# Patient Record
Sex: Female | Born: 1945 | Race: Black or African American | Hispanic: No | Marital: Married | Smoking: Never smoker
Health system: Southern US, Community
[De-identification: ages and names within clinical notes are randomized; demographics above are authoritative.]

## PROBLEM LIST (undated history)

## (undated) DIAGNOSIS — G709 Myoneural disorder, unspecified: Secondary | ICD-10-CM

## (undated) DIAGNOSIS — E559 Vitamin D deficiency, unspecified: Secondary | ICD-10-CM

## (undated) DIAGNOSIS — F419 Anxiety disorder, unspecified: Secondary | ICD-10-CM

## (undated) DIAGNOSIS — T8859XA Other complications of anesthesia, initial encounter: Secondary | ICD-10-CM

## (undated) DIAGNOSIS — Z973 Presence of spectacles and contact lenses: Secondary | ICD-10-CM

## (undated) DIAGNOSIS — I639 Cerebral infarction, unspecified: Secondary | ICD-10-CM

## (undated) DIAGNOSIS — K56600 Partial intestinal obstruction, unspecified as to cause: Secondary | ICD-10-CM

## (undated) DIAGNOSIS — T4145XA Adverse effect of unspecified anesthetic, initial encounter: Secondary | ICD-10-CM

## (undated) DIAGNOSIS — R42 Dizziness and giddiness: Secondary | ICD-10-CM

## (undated) DIAGNOSIS — M549 Dorsalgia, unspecified: Secondary | ICD-10-CM

## (undated) DIAGNOSIS — K469 Unspecified abdominal hernia without obstruction or gangrene: Secondary | ICD-10-CM

## (undated) DIAGNOSIS — K219 Gastro-esophageal reflux disease without esophagitis: Secondary | ICD-10-CM

## (undated) DIAGNOSIS — M1990 Unspecified osteoarthritis, unspecified site: Secondary | ICD-10-CM

## (undated) DIAGNOSIS — E785 Hyperlipidemia, unspecified: Secondary | ICD-10-CM

## (undated) DIAGNOSIS — I10 Essential (primary) hypertension: Secondary | ICD-10-CM

## (undated) HISTORY — DX: Gastro-esophageal reflux disease without esophagitis: K21.9

## (undated) HISTORY — DX: Anxiety disorder, unspecified: F41.9

## (undated) HISTORY — DX: Unspecified abdominal hernia without obstruction or gangrene: K46.9

## (undated) HISTORY — DX: Vitamin D deficiency, unspecified: E55.9

## (undated) HISTORY — DX: Essential (primary) hypertension: I10

## (undated) HISTORY — DX: Hyperlipidemia, unspecified: E78.5

---

## 1970-02-18 HISTORY — PX: TUBAL LIGATION: SHX77

## 1985-02-18 HISTORY — PX: ABDOMINAL HYSTERECTOMY: SHX81

## 2005-06-06 ENCOUNTER — Emergency Department: Payer: Self-pay | Admitting: Emergency Medicine

## 2005-09-04 ENCOUNTER — Ambulatory Visit: Payer: Self-pay | Admitting: Anesthesiology

## 2005-10-24 ENCOUNTER — Ambulatory Visit: Payer: Self-pay | Admitting: Anesthesiology

## 2005-11-14 ENCOUNTER — Ambulatory Visit: Payer: Self-pay | Admitting: Anesthesiology

## 2006-01-06 ENCOUNTER — Emergency Department: Payer: Self-pay | Admitting: Emergency Medicine

## 2006-04-09 ENCOUNTER — Encounter: Admission: RE | Admit: 2006-04-09 | Discharge: 2006-04-09 | Payer: Self-pay | Admitting: Neurosurgery

## 2006-08-05 ENCOUNTER — Emergency Department: Payer: Self-pay | Admitting: Emergency Medicine

## 2007-02-11 ENCOUNTER — Other Ambulatory Visit: Payer: Self-pay

## 2007-02-11 ENCOUNTER — Inpatient Hospital Stay: Payer: Self-pay | Admitting: *Deleted

## 2007-02-19 HISTORY — PX: CARDIAC CATHETERIZATION: SHX172

## 2007-02-24 ENCOUNTER — Ambulatory Visit: Payer: Self-pay | Admitting: Internal Medicine

## 2008-02-19 ENCOUNTER — Emergency Department: Payer: Self-pay | Admitting: Unknown Physician Specialty

## 2008-02-25 ENCOUNTER — Ambulatory Visit: Payer: Self-pay | Admitting: Family Medicine

## 2008-11-19 ENCOUNTER — Emergency Department: Payer: Self-pay | Admitting: Emergency Medicine

## 2011-11-28 ENCOUNTER — Emergency Department: Payer: Self-pay | Admitting: Emergency Medicine

## 2012-01-22 DIAGNOSIS — Z860100 Personal history of colon polyps, unspecified: Secondary | ICD-10-CM | POA: Insufficient documentation

## 2012-03-09 ENCOUNTER — Ambulatory Visit: Payer: Self-pay | Admitting: Emergency Medicine

## 2012-03-09 LAB — CBC WITH DIFFERENTIAL/PLATELET
Basophil #: 0 10*3/uL (ref 0.0–0.1)
Basophil %: 0.8 %
Eosinophil #: 0.1 10*3/uL (ref 0.0–0.7)
Eosinophil %: 2.3 %
HCT: 47.2 % — ABNORMAL HIGH (ref 35.0–47.0)
HGB: 16 g/dL (ref 12.0–16.0)
Lymphocyte #: 2.5 10*3/uL (ref 1.0–3.6)
Lymphocyte %: 47.8 %
MCH: 30.7 pg (ref 26.0–34.0)
MCHC: 33.8 g/dL (ref 32.0–36.0)
MCV: 91 fL (ref 80–100)
Monocyte #: 0.4 x10 3/mm (ref 0.2–0.9)
Monocyte %: 6.6 %
Neutrophil #: 2.3 10*3/uL (ref 1.4–6.5)
Neutrophil %: 42.5 %
Platelet: 247 10*3/uL (ref 150–440)
RBC: 5.19 10*6/uL (ref 3.80–5.20)
RDW: 13.6 % (ref 11.5–14.5)
WBC: 5.3 10*3/uL (ref 3.6–11.0)

## 2012-03-12 ENCOUNTER — Inpatient Hospital Stay: Payer: Self-pay | Admitting: Emergency Medicine

## 2012-03-12 HISTORY — PX: COLON SURGERY: SHX602

## 2012-03-13 LAB — CREATININE, SERUM
Creatinine: 0.74 mg/dL (ref 0.60–1.30)
EGFR (African American): >60
EGFR (Non-African Amer.): >60

## 2012-03-18 LAB — PATHOLOGY REPORT

## 2012-04-13 ENCOUNTER — Emergency Department: Payer: Self-pay | Admitting: Emergency Medicine

## 2012-04-13 LAB — COMPREHENSIVE METABOLIC PANEL
Albumin: 4 g/dL (ref 3.4–5.0)
Alkaline Phosphatase: 77 U/L (ref 50–136)
Anion Gap: 10 (ref 7–16)
BUN: 10 mg/dL (ref 7–18)
Bilirubin,Total: 1.1 mg/dL — ABNORMAL HIGH (ref 0.2–1.0)
Calcium, Total: 8.9 mg/dL (ref 8.5–10.1)
Chloride: 108 mmol/L — ABNORMAL HIGH (ref 98–107)
Co2: 21 mmol/L (ref 21–32)
Creatinine: 0.7 mg/dL (ref 0.60–1.30)
EGFR (African American): >60
EGFR (Non-African Amer.): >60
Glucose: 102 mg/dL — ABNORMAL HIGH (ref 65–99)
Osmolality: 277 (ref 275–301)
Potassium: 3.2 mmol/L — ABNORMAL LOW (ref 3.5–5.1)
SGOT(AST): 21 U/L (ref 15–37)
SGPT (ALT): 17 U/L (ref 12–78)
Sodium: 139 mmol/L (ref 136–145)
Total Protein: 8 g/dL (ref 6.4–8.2)

## 2012-04-13 LAB — CBC
HCT: 46.1 % (ref 35.0–47.0)
HGB: 15.4 g/dL (ref 12.0–16.0)
MCH: 30.2 pg (ref 26.0–34.0)
MCHC: 33.5 g/dL (ref 32.0–36.0)
MCV: 90 fL (ref 80–100)
Platelet: 197 10*3/uL (ref 150–440)
RBC: 5.11 10*6/uL (ref 3.80–5.20)
RDW: 13.6 % (ref 11.5–14.5)
WBC: 6.7 10*3/uL (ref 3.6–11.0)

## 2012-04-13 LAB — URINALYSIS, COMPLETE
Bilirubin,UR: NEGATIVE
Glucose,UR: NEGATIVE mg/dL (ref 0–75)
Hyaline Cast: 1
Nitrite: NEGATIVE
Ph: 7 (ref 4.5–8.0)
Protein: NEGATIVE
RBC,UR: 1 /HPF (ref 0–5)
Specific Gravity: 1.01 (ref 1.003–1.030)
Squamous Epithelial: 6
WBC UR: 7 /HPF (ref 0–5)

## 2012-04-13 LAB — TROPONIN I: Troponin-I: <0.02 ng/mL

## 2012-04-13 LAB — CK TOTAL AND CKMB (NOT AT ARMC)
CK, Total: 57 U/L (ref 21–215)
CK-MB: <0.5 ng/mL — ABNORMAL LOW (ref 0.5–3.6)

## 2012-06-18 ENCOUNTER — Observation Stay: Payer: Self-pay | Admitting: Student

## 2012-06-18 LAB — BASIC METABOLIC PANEL
Anion Gap: 8 (ref 7–16)
BUN: 9 mg/dL (ref 7–18)
Calcium, Total: 9.4 mg/dL (ref 8.5–10.1)
Chloride: 106 mmol/L (ref 98–107)
Co2: 24 mmol/L (ref 21–32)
Creatinine: 0.75 mg/dL (ref 0.60–1.30)
EGFR (African American): >60
EGFR (Non-African Amer.): >60
Glucose: 90 mg/dL (ref 65–99)
Osmolality: 274 (ref 275–301)
Potassium: 3.6 mmol/L (ref 3.5–5.1)
Sodium: 138 mmol/L (ref 136–145)

## 2012-06-18 LAB — CK TOTAL AND CKMB (NOT AT ARMC)
CK, Total: 47 U/L (ref 21–215)
CK, Total: 41 U/L (ref 21–215)
CK-MB: <0.5 ng/mL — ABNORMAL LOW (ref 0.5–3.6)
CK-MB: <0.5 ng/mL — ABNORMAL LOW (ref 0.5–3.6)

## 2012-06-18 LAB — CBC
HCT: 44.7 % (ref 35.0–47.0)
HGB: 15.4 g/dL (ref 12.0–16.0)
MCH: 30.3 pg (ref 26.0–34.0)
MCHC: 34.4 g/dL (ref 32.0–36.0)
MCV: 88 fL (ref 80–100)
Platelet: 250 10*3/uL (ref 150–440)
RBC: 5.07 10*6/uL (ref 3.80–5.20)
RDW: 13.9 % (ref 11.5–14.5)
WBC: 5.8 10*3/uL (ref 3.6–11.0)

## 2012-06-18 LAB — LIPID PANEL
Cholesterol: 235 mg/dL — ABNORMAL HIGH (ref 0–200)
HDL Cholesterol: 47 mg/dL (ref 40–60)
Ldl Cholesterol, Calc: 169 mg/dL — ABNORMAL HIGH (ref 0–100)
Triglycerides: 96 mg/dL (ref 0–200)
VLDL Cholesterol, Calc: 19 mg/dL (ref 5–40)

## 2012-06-18 LAB — TROPONIN I: Troponin-I: <0.02 ng/mL

## 2012-06-18 LAB — HEMOGLOBIN A1C: Hemoglobin A1C: 5.6 % (ref 4.2–6.3)

## 2012-06-19 LAB — CBC WITH DIFFERENTIAL/PLATELET
Basophil #: 0.1 10*3/uL (ref 0.0–0.1)
Basophil %: 1.1 %
Eosinophil #: 0.3 10*3/uL (ref 0.0–0.7)
Eosinophil %: 4.9 %
HCT: 39.8 % (ref 35.0–47.0)
HGB: 13.7 g/dL (ref 12.0–16.0)
Lymphocyte #: 3.4 10*3/uL (ref 1.0–3.6)
Lymphocyte %: 61.8 %
MCH: 30.6 pg (ref 26.0–34.0)
MCHC: 34.4 g/dL (ref 32.0–36.0)
MCV: 89 fL (ref 80–100)
Monocyte #: 0.4 x10 3/mm (ref 0.2–0.9)
Monocyte %: 6.8 %
Neutrophil #: 1.4 10*3/uL (ref 1.4–6.5)
Neutrophil %: 25.4 %
Platelet: 213 10*3/uL (ref 150–440)
RBC: 4.47 10*6/uL (ref 3.80–5.20)
RDW: 13.7 % (ref 11.5–14.5)
WBC: 5.5 10*3/uL (ref 3.6–11.0)

## 2012-06-19 LAB — CK TOTAL AND CKMB (NOT AT ARMC)
CK, Total: 36 U/L (ref 21–215)
CK-MB: <0.5 ng/mL — ABNORMAL LOW (ref 0.5–3.6)

## 2012-06-19 LAB — BASIC METABOLIC PANEL
Anion Gap: 6 — ABNORMAL LOW (ref 7–16)
BUN: 12 mg/dL (ref 7–18)
Calcium, Total: 8.4 mg/dL — ABNORMAL LOW (ref 8.5–10.1)
Chloride: 111 mmol/L — ABNORMAL HIGH (ref 98–107)
Co2: 25 mmol/L (ref 21–32)
Creatinine: 0.68 mg/dL (ref 0.60–1.30)
EGFR (African American): >60
EGFR (Non-African Amer.): >60
Glucose: 84 mg/dL (ref 65–99)
Osmolality: 282 (ref 275–301)
Potassium: 3.6 mmol/L (ref 3.5–5.1)
Sodium: 142 mmol/L (ref 136–145)

## 2012-06-19 LAB — TROPONIN I: Troponin-I: <0.02 ng/mL

## 2012-06-19 LAB — MAGNESIUM: Magnesium: 1.9 mg/dL

## 2013-08-27 ENCOUNTER — Emergency Department: Payer: Self-pay | Admitting: Emergency Medicine

## 2013-08-27 LAB — URINALYSIS, COMPLETE
Bilirubin,UR: NEGATIVE
Blood: NEGATIVE
Glucose,UR: NEGATIVE mg/dL (ref 0–75)
Ketone: NEGATIVE
Leukocyte Esterase: NEGATIVE
Nitrite: NEGATIVE
Ph: 7 (ref 4.5–8.0)
Protein: NEGATIVE
RBC,UR: 4 /HPF (ref 0–5)
Specific Gravity: 1.016 (ref 1.003–1.030)
Squamous Epithelial: 7
WBC UR: 3 /HPF (ref 0–5)

## 2013-08-27 LAB — CBC WITH DIFFERENTIAL/PLATELET
Basophil #: 0.1 10*3/uL (ref 0.0–0.1)
Basophil %: 1.2 %
Eosinophil #: 0.2 10*3/uL (ref 0.0–0.7)
Eosinophil %: 4.1 %
HCT: 44.6 % (ref 35.0–47.0)
HGB: 14.8 g/dL (ref 12.0–16.0)
Lymphocyte #: 3.1 10*3/uL (ref 1.0–3.6)
Lymphocyte %: 53.8 %
MCH: 30.7 pg (ref 26.0–34.0)
MCHC: 33.2 g/dL (ref 32.0–36.0)
MCV: 92 fL (ref 80–100)
Monocyte #: 0.5 x10 3/mm (ref 0.2–0.9)
Monocyte %: 7.8 %
Neutrophil #: 1.9 10*3/uL (ref 1.4–6.5)
Neutrophil %: 33.1 %
Platelet: 221 10*3/uL (ref 150–440)
RBC: 4.83 10*6/uL (ref 3.80–5.20)
RDW: 13.4 % (ref 11.5–14.5)
WBC: 5.8 10*3/uL (ref 3.6–11.0)

## 2013-08-27 LAB — TSH: Thyroid Stimulating Horm: 1.4 u[IU]/mL

## 2013-08-27 LAB — BASIC METABOLIC PANEL
Anion Gap: 6 — ABNORMAL LOW (ref 7–16)
BUN: 11 mg/dL (ref 7–18)
Calcium, Total: 8.3 mg/dL — ABNORMAL LOW (ref 8.5–10.1)
Chloride: 109 mmol/L — ABNORMAL HIGH (ref 98–107)
Co2: 25 mmol/L (ref 21–32)
Creatinine: 1.28 mg/dL (ref 0.60–1.30)
EGFR (African American): 51 — ABNORMAL LOW
EGFR (Non-African Amer.): 44 — ABNORMAL LOW
Glucose: 94 mg/dL (ref 65–99)
Osmolality: 279 (ref 275–301)
Potassium: 3.6 mmol/L (ref 3.5–5.1)
Sodium: 140 mmol/L (ref 136–145)

## 2013-08-27 LAB — TROPONIN I: Troponin-I: <0.02 ng/mL

## 2013-09-09 ENCOUNTER — Ambulatory Visit: Payer: Self-pay | Admitting: Gastroenterology

## 2013-10-19 ENCOUNTER — Ambulatory Visit: Payer: Self-pay | Admitting: Family Medicine

## 2014-01-05 ENCOUNTER — Emergency Department: Payer: Self-pay | Admitting: Emergency Medicine

## 2014-01-05 LAB — BASIC METABOLIC PANEL
Anion Gap: 7 (ref 7–16)
BUN: 12 mg/dL (ref 7–18)
Calcium, Total: 8.6 mg/dL (ref 8.5–10.1)
Chloride: 108 mmol/L — ABNORMAL HIGH (ref 98–107)
Co2: 26 mmol/L (ref 21–32)
Creatinine: 0.89 mg/dL (ref 0.60–1.30)
EGFR (African American): >60
EGFR (Non-African Amer.): >60
Glucose: 94 mg/dL (ref 65–99)
Osmolality: 281 (ref 275–301)
Potassium: 3.9 mmol/L (ref 3.5–5.1)
Sodium: 141 mmol/L (ref 136–145)

## 2014-01-05 LAB — URINALYSIS, COMPLETE
Bilirubin,UR: NEGATIVE
Glucose,UR: NEGATIVE mg/dL (ref 0–75)
Ketone: NEGATIVE
Leukocyte Esterase: NEGATIVE
Nitrite: NEGATIVE
Ph: 6 (ref 4.5–8.0)
Protein: NEGATIVE
RBC,UR: 3 /HPF (ref 0–5)
Specific Gravity: 1.024 (ref 1.003–1.030)
Squamous Epithelial: 1
WBC UR: 4 /HPF (ref 0–5)

## 2014-01-05 LAB — CBC
HCT: 47.2 % — ABNORMAL HIGH (ref 35.0–47.0)
HGB: 15.5 g/dL (ref 12.0–16.0)
MCH: 30.5 pg (ref 26.0–34.0)
MCHC: 32.9 g/dL (ref 32.0–36.0)
MCV: 93 fL (ref 80–100)
Platelet: 238 10*3/uL (ref 150–440)
RBC: 5.08 10*6/uL (ref 3.80–5.20)
RDW: 13.1 % (ref 11.5–14.5)
WBC: 6.1 10*3/uL (ref 3.6–11.0)

## 2014-01-05 LAB — TROPONIN I: Troponin-I: <0.02 ng/mL

## 2014-02-22 DIAGNOSIS — E785 Hyperlipidemia, unspecified: Secondary | ICD-10-CM | POA: Diagnosis not present

## 2014-05-06 DIAGNOSIS — R202 Paresthesia of skin: Secondary | ICD-10-CM | POA: Diagnosis not present

## 2014-05-06 DIAGNOSIS — Z8261 Family history of arthritis: Secondary | ICD-10-CM | POA: Diagnosis not present

## 2014-06-05 ENCOUNTER — Emergency Department
Admit: 2014-06-05 | Disposition: A | Discharge status: Home / Self Care | Payer: Self-pay | Admitting: Emergency Medicine

## 2014-06-05 DIAGNOSIS — R110 Nausea: Secondary | ICD-10-CM | POA: Diagnosis not present

## 2014-06-05 DIAGNOSIS — Z7982 Long term (current) use of aspirin: Secondary | ICD-10-CM | POA: Diagnosis not present

## 2014-06-05 DIAGNOSIS — M549 Dorsalgia, unspecified: Secondary | ICD-10-CM | POA: Diagnosis not present

## 2014-06-05 DIAGNOSIS — K5900 Constipation, unspecified: Secondary | ICD-10-CM | POA: Diagnosis not present

## 2014-06-05 DIAGNOSIS — F419 Anxiety disorder, unspecified: Secondary | ICD-10-CM | POA: Diagnosis not present

## 2014-06-05 DIAGNOSIS — R079 Chest pain, unspecified: Secondary | ICD-10-CM | POA: Diagnosis not present

## 2014-06-05 DIAGNOSIS — R100 Acute abdomen: Secondary | ICD-10-CM | POA: Diagnosis not present

## 2014-06-05 DIAGNOSIS — D7389 Other diseases of spleen: Secondary | ICD-10-CM | POA: Diagnosis not present

## 2014-06-05 DIAGNOSIS — K5730 Diverticulosis of large intestine without perforation or abscess without bleeding: Secondary | ICD-10-CM | POA: Diagnosis not present

## 2014-06-05 DIAGNOSIS — R109 Unspecified abdominal pain: Secondary | ICD-10-CM | POA: Diagnosis not present

## 2014-06-05 DIAGNOSIS — Z79899 Other long term (current) drug therapy: Secondary | ICD-10-CM | POA: Diagnosis not present

## 2014-06-05 LAB — COMPREHENSIVE METABOLIC PANEL
Albumin: 3.9 g/dL
Alkaline Phosphatase: 60 U/L
Anion Gap: 6 — ABNORMAL LOW (ref 7–16)
BUN: 17 mg/dL
Bilirubin,Total: 0.9 mg/dL
Calcium, Total: 8.5 mg/dL — ABNORMAL LOW
Chloride: 108 mmol/L
Co2: 25 mmol/L
Creatinine: 0.83 mg/dL
EGFR (African American): >60
EGFR (Non-African Amer.): >60
Glucose: 134 mg/dL — ABNORMAL HIGH
Potassium: 3 mmol/L — ABNORMAL LOW
SGOT(AST): 21 U/L
SGPT (ALT): 12 U/L — ABNORMAL LOW
Sodium: 139 mmol/L
Total Protein: 7.1 g/dL

## 2014-06-05 LAB — CBC WITH DIFFERENTIAL/PLATELET
Basophil #: 0.1 10*3/uL (ref 0.0–0.1)
Basophil %: 1 %
Eosinophil #: 0.2 10*3/uL (ref 0.0–0.7)
Eosinophil %: 2.3 %
HCT: 43 % (ref 35.0–47.0)
HGB: 14.4 g/dL (ref 12.0–16.0)
Lymphocyte #: 3.3 10*3/uL (ref 1.0–3.6)
Lymphocyte %: 45 %
MCH: 30.6 pg (ref 26.0–34.0)
MCHC: 33.4 g/dL (ref 32.0–36.0)
MCV: 92 fL (ref 80–100)
Monocyte #: 0.5 x10 3/mm (ref 0.2–0.9)
Monocyte %: 6.1 %
Neutrophil #: 3.4 10*3/uL (ref 1.4–6.5)
Neutrophil %: 45.6 %
Platelet: 235 10*3/uL (ref 150–440)
RBC: 4.69 10*6/uL (ref 3.80–5.20)
RDW: 13 % (ref 11.5–14.5)
WBC: 7.4 10*3/uL (ref 3.6–11.0)

## 2014-06-05 LAB — URINALYSIS, COMPLETE
Bilirubin,UR: NEGATIVE
Glucose,UR: NEGATIVE mg/dL (ref 0–75)
Ketone: NEGATIVE
Leukocyte Esterase: NEGATIVE
Nitrite: NEGATIVE
Ph: 6 (ref 4.5–8.0)
Protein: NEGATIVE
Specific Gravity: 1.013 (ref 1.003–1.030)
Squamous Epithelial: NONE SEEN

## 2014-06-05 LAB — TROPONIN I: Troponin-I: <0.03 ng/mL

## 2014-06-05 LAB — LIPASE, BLOOD: Lipase: 39 U/L

## 2014-06-10 NOTE — H&P (Signed)
PATIENT NAME:  [name], [name] MR#:  [id number] DATE OF BIRTH:  1945/12/27  DATE OF ADMISSION:  06/18/2012  ADMITTING PHYSICIAN: Gladstone Lighter, MD  PRIMARY CARE PHYSICIAN: [name]. [name], MD   PRIMARY CARDIOLOGIST: Has seen Dr. Nehemiah Massed in the past   CHIEF COMPLAINT: Chest pain.   HISTORY OF PRESENT ILLNESS: The patient is a 69 year old African American female with no significant past medical history other than degenerative arthritis, comes to the hospital secondary to a 2-week history of chest pain. The patient said she had chest pain in the past, had a cardiac cath in 2009 which was negative at the time, has been having chest pain lately over the last 2 weeks and seen her PCP.  She had an old prescription for Xanax which she used to see if that would help her, but it did not help her because she does not think this is anxiety. She had EMS come in for chest pain last week, but at that time it was more a panic attack and was relieved by Xanax. Her symptoms have been getting worse, and today she was having extensive  chest tightness, 8 out of 10 in intensity, radiating to her back, associated with nausea and dyspnea, and comes to the hospital. EKG did not show any acute changes, and first set of troponin is negative, so is being admitted under observation for Myoview in the a.m.   PAST MEDICAL HISTORY: Degenerative arthritis.  PAST SURGICAL HISTORY:  1.  Partial transverse colostomy for adenomatous polyp in January 2014.  2.  Cholecystectomy.  3.  Tubal ligation.  4.  Hysterectomy.   ALLERGIES TO MEDICATIONS: No known drug allergies.   HOME MEDICATIONS: Xanax 0.5 mg p.r.n., has not used it for a long time other than a week ago for panic attack.   SOCIAL HISTORY: Lives at home with her husband. No smoking or alcohol use.   FAMILY HISTORY: Mom had congestive heart failure, and dad died from metastatic unknown cancer.   REVIEW OF SYSTEMS:   CONSTITUTIONAL: No fever, fatigue or weakness.   EYES: Uses contact lens. No double vision, inflammation, glaucoma or cataracts.  ENT: No tinnitus, ear pain, hearing loss, epistaxis or discharge.  RESPIRATORY: No cough, wheeze, hemoptysis or COPD.  CARDIOVASCULAR: Positive for chest pain. No orthopnea, edema, arrhythmia, palpitations or syncope.  GASTROINTESTINAL: No nausea, vomiting, diarrhea, abdominal pain, hematemesis or melena.  GENITOURINARY: No dysuria, hematuria, renal calculus, frequency or incontinence.  ENDOCRINE: No polyuria, nocturia, thyroid problems, heat or cold intolerance.  HEMATOLOGY: No anemia, easy bruising or bleeding.  SKIN: No acne, rash or lesions.  MUSCULOSKELETAL: No neck, back, shoulder pain, arthritis or gout.  NEUROLOGIC: No numbness, weakness, CVA, transient ischemic attack or seizures.  PSYCHOLOGICAL: No anxiety, insomnia or depression.   PHYSICAL EXAMINATION: VITAL SIGNS: Temperature 98.3 degrees Fahrenheit, pulse 89, respirations 20, blood pressure 147/97, pulse ox 100% on room air.  GENERAL: Well-built, well-nourished female lying in bed, not in any acute distress.  HEENT: Normocephalic, atraumatic. Pupils are equal, round, reacting to light. Anicteric sclerae. Extraocular movements intact. Oropharynx clear without erythema, mass or exudates.  NECK: Supple. No thyromegaly, JVD or carotid bruits. No lymphadenopathy.  LUNGS: Moving air bilaterally, no wheeze or crackles. No use of accessory muscles for breathing.  CARDIOVASCULAR: S1, S2 regular rate and rhythm. No murmurs, rubs, or gallops.  ABDOMEN: Soft.  Mild tenderness to the left side of her midline incision from a small herniated area from her recent surgery but no guarding or  rigidity. Normal bowel sounds. No hepatosplenomegaly.  EXTREMITIES: No pedal edema. No clubbing or cyanosis. 2+ dorsalis pedis pulses palpable bilaterally.  SKIN: No acne, rash or lesions.  LYMPHATICS: No cervical or inguinal lymphadenopathy.  NEUROLOGIC: Cranial nerves intact.  No focal motor or sensory deficits.   PSYCHOLOGICAL: The patient is awake, alert, oriented x 3.   LABORATORY AND RADIOLOGICAL DATA:  1.  WBC 5.8, hemoglobin 15.5, hematocrit 44.7, platelet count 250.  2.  Sodium 138, potassium 3.6, chloride 106, bicarbonate 24, BUN 9, creatinine 0.75, glucose 90, calcium of 9.4.  3.  First set of troponins less than 0.02.  CK 47 and CK-MB less than 0.5.  4.  Chest x-ray at the time revealing no acute cardiopulmonary disease.  5. Again, cardiac catheterization done in January 2009 showing normal coronary anatomy, normal LV function with EF of 70%.  6.  EKG showing normal sinus rhythm, heart rate of 70. No ST-T wave abnormalities.   ASSESSMENT AND PLAN: This is a 69 year old female with no significant past medical history, comes to the ER secondary to chest pain.   1.  Chest pain:  Could be angina versus anxiety. She had recurrent chest pains in the past and had a cardiac cath in 2009 which was completely negative; however, since the symptoms are correlating with typical angina, with her age and family history, we will admit her under observation to telemetry. Continue aspirin, nitro, get hemoglobin A1c and lipid profile for risk stratification and treadmill Myoview in the a.m. by [facility]. Recycle troponins.  2.  Arthritis:  Well-controlled.  3.  Gastrointestinal and deep vein thrombosis prophylaxis:  On Protonix and subcutaneous heparin.   CODE STATUS:  FULL CODE.    TIME SPENT WITH ADMISSION:  50 minutes.   ____________________________ Gladstone Lighter, MD rk:cb D: 06/18/2012 17:32:52 ET T: 06/18/2012 17:56:01 ET JOB#: [id number]  cc: Gladstone Lighter, MD, <Dictator> [name]. [name], MD Gladstone Lighter MD ELECTRONICALLY SIGNED 06/19/2012 02:77

## 2014-06-10 NOTE — Discharge Summary (Signed)
PATIENT NAME:  [name], [name] MR#:  [id number] DATE OF BIRTH:  September 30, 1945  DATE OF ADMISSION:  06/18/2012 DATE OF DISCHARGE:  06/19/2012  CHIEF COMPLAINT:  Chest pain.   DISCHARGE DIAGNOSES: 1.  Chest pain, likely anxiety and not cardiac.  2.  Hyperlipidemia.  3.  Anxiety.  4.  Degenerative arthritis.   DISCHARGE MEDICATIONS: 1.  Xanax 0.5 mg 1 tab as needed for anxiety.  2.  Aspirin 81 mg daily.  3.  Simvastatin 20 mg daily.   DIET:  Low fat, low cholesterol.   ACTIVITY:  As tolerated.   DISCHARGE FOLLOW-UP:  Please follow up with your primary care physician within 1 to 2 weeks.   DISPOSITION:  Home.   SIGNIFICANT LABORATORIES:  A stress test showing left ventricular functional mildly reduced.  No evidence of myocardial scar or ischemia.  Echocardiogram showing ejection fraction of 60% to 65% with normal LV systolic function, impaired relaxation pattern of LV diastolic filling.  X-ray of the chest, PA and lateral, showing no acute cardiopulmonary disease.  Troponins were negative x 2 whereas CK-MB fraction was negative x 3.  LDL was 169.  Triglycerides was 96, hemoglobin A1c was 5.6.  On arrival sodium was 138, potassium 3.6, chloride 106.   HISTORY OF PRESENT ILLNESS AND HOSPITAL COURSE:  For full details of history and physical, please see the dictation on May 1 by Dr. Tressia Miners, but briefly, this is a pleasant 69 year old African American female with degenerative arthritis and a history of recent partial colectomy for adenomatous polyp in January of this year, who presents with chest pain.  The patient did have an initial episode of chest pain several weeks ago that resolved with Xanax, but then had more chest pains that did not and therefore was admitted to the hospitalist service to rule out acute MI.  She told me that she has had increased anxiety since her surgery in January, but her symptoms could be worse with exertion.  She was admitted to the hospitalist service in telemetry.   Troponins were negative x 3.  CK-MB was negative x 3.  She did not have any further chest pains.  Her lipid profile was checked and she did have elevated LDL at 169 and therefore started on simvastatin.  Her aspirin was continued.  She underwent a stress test which did not show acute ischemia and therefore will be discharged.  Echocardiogram showed normal ejection fraction.  It showed some diastolic dysfunction, however that is not significant in regards to this admission and patient does not appear to be in any acute diastolic congestive heart failure and is not volume-overloaded.  She can follow up with her primary care physician for outpatient follow-up and treatment of her Xanax which could potentially be contributing to her symptoms.     Total time spent:  33 minutes.     ____________________________ Vivien Presto, MD sa:ea D: 06/19/2012 16:14:31 ET T: 06/20/2012 06:03:34 ET JOB#: [id number]  cc: Vivien Presto, MD, <Dictator> [place] [name], MD Vivien Presto MD ELECTRONICALLY SIGNED 07/13/2012 15:10

## 2014-06-10 NOTE — Op Note (Signed)
PATIENT NAME:  [name], [name] MR#:  [id number] DATE OF BIRTH:  Oct 24, 1945  DATE OF PROCEDURE:  03/12/2012  PREOPERATIVE DIAGNOSIS: Transverse colon polyp.   POSTOPERATIVE DIAGNOSIS: Transverse colon polyp.   PROCEDURE: Transverse colon resection.   SURGEON: Vella Kohler, M.D.   INDICATIONS: This is a patient who had a colonoscopy performed, had a polyp in the transverse colon. Dr. Dionne Milo did a colonoscopic resection and he thought there was a possibility that some of the polyp was still there. The area was inked and the patient was then brought in for resection of the transverse colon. After the patient was put to sleep, the abdomen was then prepped and draped. An incision was made, an upper midline incision. After cutting skin and subcutaneous tissue, the fascia was cut. The abdomen was entered. The patient had multiple adhesions from past surgery. Those were released, and most of the adhesions were over the right hepatic area where the hepatic flexure was stuck and part of the transverse colon was stuck. Those were released. Part of the cecum was also mobilized. I saw the area a little bit to the left of midline, an area of black ink, so I took off the omentum from the surface of the colon with the Harmonic scalpel. Quite a generous amount of the colon was then removed, about a couple of inches on either side at least, and it was transected and then I removed this portion of colon. I opened the thing. I did not see an obvious lesion, but there was an area where transection was performed where I could see it. After that, I mobilized more of the ascending colon and mobilized more of the transverse colon up to the splenic flexure area, and I felt the colon from the surface and I could not feel any other lesions. I took out another 1-1/2 inches towards the splenic flexure area too to make sure that the whole thing was out. After this was taken off, I then opened both ends of the colon. I looked visually  from inside the colon. I could not feel anything from a distance on each side, so I think the resected area was in the colon which was resected. After, anastomosis was then performed with GIA stapler and with TA-60. Then, I applied sutures over the resected margin with 4-0 silk sutures. After this was done with the TA, I closed the end of the colon, showing the anastomosis wide open, and I made sure there was no tumor in this area. After this was done, the mesentery was then closed. All the instrument count and the sponge count was correct. The fascia was then closed with interrupted 0 Vicryl sutures. I felt the liver also. There was no evidence of any tumor in the abdomen anywhere else. The fascia was closed with interrupted 0 Vicryl sutures. Skin was closed with staples and Marcaine was injected in the skin. The patient tolerated the procedure well and was sent to the recovery room in satisfactory condition.   ____________________________ Welford Roche Phylis Bougie, MD msh:jm D: 03/12/2012 14:44:33 ET T: 03/12/2012 16:38:50 ET JOB#: [id number]  cc: Jahnaya Branscome S. Phylis Bougie, MD, <Dictator> Sharene Butters MD ELECTRONICALLY SIGNED 03/15/2012 13:40

## 2014-06-10 NOTE — Discharge Summary (Signed)
Dates of Admission and Diagnosis:   Date of Admission 12-Mar-2012    Date of Discharge 16-Mar-2012    Admitting Diagnosis transverse colon tumor polyp    Final Diagnosis pending pathology    Discharge Diagnosis 1 colon polyp     Chief Complaint/History of Present Illness pt had colonoscopy had a polyp in transverse colon resected earlier pt had surgery to remove the area   Routine BB:  22-Jan-14 06:00    Crossmatch Unit 1 Cancelled   Crossmatch Unit 2 Cancelled  Routine Chem:  22-Jan-14 06:00    Result Comment XMATCH - PRODUCTS NOT NEEDED FOR SURGERY  Result(s) reported on 13 Mar 2012 at 02:28PM.  24-Jan-14 10:27    Creatinine (comp) 0.74   eGFR (African American) >60   eGFR (Non-African American) >60 (eGFR values <68m/min/1.73 m2 may be an indication of chronic kidney disease (CKD). Calculated eGFR is useful in patients with stable renal function. The eGFR calculation will not be reliable in acutely ill patients when serum creatinine is changing rapidly. It is not useful in  patients on dialysis. The eGFR calculation may not be applicable to patients at the low and high extremes of body sizes, pregnant women, and vegetarians.)   Hospital Course:   Hospital Course did very well and moving bowels eating no nausea and vomiting    Condition on Discharge Good   DISCHARGE INSTRUCTIONS HOME MEDS:  Medication Reconciliation:  Patient's Home Medications at Discharge:     Medication Instructions  xanax 0.5 mg oral tablet  1 tab(s) orally , As Needed- for Anxiety, Nervousness    prilosec 20 mg oral delayed release capsule  1 cap(s) orally once a day, As Needed   aspirin 867m 1 tab(s) orally once a day in am   percocet 5/325 325 mg-5 mg oral tablet  1 tab(s) orally every 6 hours   ciprofloxacin ophthalmic 0.3% solution  2  drops to each affected eye every 4 hours     PRESCRIPTIONS: PRINTED AND PLACED ON CHART   STOP TAKING THE FOLLOWING MEDICATION(S):    none  Physician's Instructions:   Home Health? No    Treatments None    Diet Regular    Activity Limitations No exertional activity    Return to Work after follow up visit with MD    Time frame for Follow Up Appointment 1-2 weeks   Electronic Signatures: HaVella KohlerMD)  (Signed 27-Jan-14 13:02)  Authored: ADMISSION DATE AND DIAGNOSIS, CHIEF COMPLAINT/HPI, PERTINENT LAEmersonEDS, PATIENT INSTRUCTIONS   Last Updated: 27-Jan-14 13:02 by HaVella KohlerMD)

## 2014-07-21 ENCOUNTER — Other Ambulatory Visit: Payer: Self-pay | Admitting: Family Medicine

## 2014-08-01 ENCOUNTER — Ambulatory Visit (INDEPENDENT_AMBULATORY_CARE_PROVIDER_SITE_OTHER): Payer: Medicare Other | Admitting: Family Medicine

## 2014-08-01 ENCOUNTER — Encounter: Payer: Self-pay | Admitting: Family Medicine

## 2014-08-01 ENCOUNTER — Ambulatory Visit: Payer: Self-pay | Admitting: Family Medicine

## 2014-08-01 VITALS — BP 124/82 | HR 64 | Temp 98.1°F | Resp 16 | Ht 63.0 in | Wt 152.2 lb

## 2014-08-01 DIAGNOSIS — F4322 Adjustment disorder with anxiety: Secondary | ICD-10-CM

## 2014-08-01 DIAGNOSIS — J309 Allergic rhinitis, unspecified: Secondary | ICD-10-CM | POA: Insufficient documentation

## 2014-08-01 DIAGNOSIS — I10 Essential (primary) hypertension: Secondary | ICD-10-CM | POA: Insufficient documentation

## 2014-08-01 DIAGNOSIS — F419 Anxiety disorder, unspecified: Secondary | ICD-10-CM | POA: Insufficient documentation

## 2014-08-01 DIAGNOSIS — R42 Dizziness and giddiness: Secondary | ICD-10-CM | POA: Diagnosis not present

## 2014-08-01 DIAGNOSIS — E559 Vitamin D deficiency, unspecified: Secondary | ICD-10-CM | POA: Diagnosis not present

## 2014-08-01 DIAGNOSIS — E785 Hyperlipidemia, unspecified: Secondary | ICD-10-CM | POA: Insufficient documentation

## 2014-08-01 DIAGNOSIS — K219 Gastro-esophageal reflux disease without esophagitis: Secondary | ICD-10-CM | POA: Insufficient documentation

## 2014-08-01 DIAGNOSIS — IMO0002 Reserved for concepts with insufficient information to code with codable children: Secondary | ICD-10-CM | POA: Insufficient documentation

## 2014-08-01 NOTE — Progress Notes (Signed)
Subjective:     Patient ID: [name], female   DOB: 11/22/1945, 69 y.o.   MRN: [id number]  HPI  Chief Complaint  Patient presents with  . Abdominal Pain    patient is present in office today with complaints of abdominal pain that is described as all over. Patient states she had colon surgery 2 yrs ago and has always had constant need to use restroom, patient reports multiple bowl movements daily. Last week and today patient had eaten dairy product and shortly after eating she has had abdominal discomfort and bloating. Patient reports she felt dizzy/lightheadead and is concerned because she had similar symptoms when she found out her iron  was low yr ago  She has just recently experienced and attended the funerals of a family member and in the last 2 weeks of her best friend. States she is taking Xanax twice daily to cope but has been having crying spells at times.Somatic sx have included nausea, abdominal pain and lightheadedness.   Review of Systems  Gastrointestinal: Positive for abdominal distention (last moved her bowels this AM. Pattern is usually twice daily. Has a hx of parrtial colectomy two  years ago for  a "pre-malignant " mass. Last colonoscopy in 2015. She is due f/u with Dr. Allen Norris in July.).  Musculoskeletal: Positive for arthralgias (states Vitamin D helped this but she discontinued it after 3 weeks due to constipation.).       Objective:   Physical Exam  Constitutional: She appears well-developed and well-nourished. No distress.  Cardiovascular: Normal rate and regular rhythm.   Pulmonary/Chest: Breath sounds normal.  Abdominal: Soft. Bowel sounds are normal. There is no tenderness.       Assessment:     1. Adjustment disorder with anxious mood   2. Vitamin D deficiency - Vitamin D 1,25 dihydroxy  3. Lightheade - CBC with Differential/Platelet - COMPLETE METABOLIC PANEL WITH GFR - Ferritin     Plan:     Continue Xanax 2-3 x day Further evaluation and/or  referral to G.I. Before July pending lab work.

## 2014-08-01 NOTE — Patient Instructions (Signed)
Discussed continuing alprazolam 2-3 x day. We will call with lab work We will schedule appointment with Dr.Wohl after reviewing your labwork.

## 2014-08-02 ENCOUNTER — Telehealth: Payer: Self-pay

## 2014-08-02 LAB — CBC WITH DIFFERENTIAL/PLATELET
Basophils Absolute: 0 10*3/uL (ref 0.0–0.2)
Basos: 1 %
EOS (ABSOLUTE): 0.1 10*3/uL (ref 0.0–0.4)
Eos: 3 %
Hematocrit: 43.8 % (ref 34.0–46.6)
Hemoglobin: 14.4 g/dL (ref 11.1–15.9)
Immature Grans (Abs): 0 10*3/uL (ref 0.0–0.1)
Immature Granulocytes: 0 %
Lymphocytes Absolute: 2.7 10*3/uL (ref 0.7–3.1)
Lymphs: 52 %
MCH: 29.7 pg (ref 26.6–33.0)
MCHC: 32.9 g/dL (ref 31.5–35.7)
MCV: 90 fL (ref 79–97)
Monocytes Absolute: 0.4 10*3/uL (ref 0.1–0.9)
Monocytes: 8 %
Neutrophils Absolute: 1.8 10*3/uL (ref 1.4–7.0)
Neutrophils: 36 %
Platelets: 260 10*3/uL (ref 150–379)
RBC: 4.85 x10E6/uL (ref 3.77–5.28)
RDW: 13.4 % (ref 12.3–15.4)
WBC: 5.1 10*3/uL (ref 3.4–10.8)

## 2014-08-02 LAB — FERRITIN: Ferritin: 215 ng/mL — ABNORMAL HIGH (ref 15–150)

## 2014-08-02 NOTE — Telephone Encounter (Signed)
-----   Message from Carmon Ginsberg, [place] sent at 08/02/2014  8:10 AM EDT ----- No anemia, your iron stores are too high so stop taking any iron supplements. Metabolic profile and Vitamin D are pending

## 2014-08-02 NOTE — Telephone Encounter (Signed)
Patient has been advised of lab report.

## 2014-08-05 ENCOUNTER — Telehealth: Payer: Self-pay | Admitting: Family Medicine

## 2014-08-05 LAB — VITAMIN D 1,25 DIHYDROXY
Vitamin D 1, 25 (OH)2 Total: 47 pg/mL
Vitamin D2 1, 25 (OH)2: 29 pg/mL
Vitamin D3 1, 25 (OH)2: 18 pg/mL

## 2014-08-05 NOTE — Telephone Encounter (Signed)
Pt is requesting her lab results.  XA#[phone]/CN

## 2014-08-05 NOTE — Addendum Note (Signed)
Addended by: Quay Burow on: 08/05/2014 07:57 AM   Modules accepted: Orders

## 2014-08-08 ENCOUNTER — Telehealth: Payer: Self-pay

## 2014-08-08 NOTE — Telephone Encounter (Signed)
Patient was informed of Vitamin D, have you seen metabolic profile?

## 2014-08-08 NOTE — Telephone Encounter (Signed)
Another patient I had to add the correct metabolic profile to previous draw. May need to call [facility] and see if they are responding to these ordrers

## 2014-08-08 NOTE — Telephone Encounter (Signed)
-----   Message from Carmon Ginsberg, [place] sent at 08/05/2014  7:58 AM EDT ----- Vitamin D ok. Metabolic profile is pending.

## 2014-08-18 ENCOUNTER — Telehealth: Payer: Self-pay | Admitting: Family Medicine

## 2014-08-18 NOTE — Telephone Encounter (Signed)
Pt contacted office for refill request on the following medications: ALPRAZolam (XANAX) 0.5 MG tablet Pt stated that when she was here for her last OV with [name] she was advised to take one pill in the morning and one at night. Pt stated that she has been doing that and she is running out of the medication because it was written for a 1/2 in the morning and 1/2 at night. Pt would like a new RX sent to [facility] in [place]. Thanks TNP

## 2014-08-23 ENCOUNTER — Other Ambulatory Visit: Payer: Self-pay | Admitting: Family Medicine

## 2014-08-23 DIAGNOSIS — F419 Anxiety disorder, unspecified: Secondary | ICD-10-CM

## 2014-08-23 MED ORDER — ALPRAZOLAM 0.5 MG PO TABS: 0.5000 mg | ORAL_TABLET | Freq: Two times a day (BID) | ORAL | Status: DC | PRN

## 2014-08-23 NOTE — Telephone Encounter (Signed)
Medication available for pick up. 

## 2014-08-23 NOTE — Telephone Encounter (Signed)
Advised patient that medication is ready for pick up.

## 2014-09-01 ENCOUNTER — Other Ambulatory Visit: Payer: Self-pay

## 2014-09-01 ENCOUNTER — Telehealth: Payer: Self-pay | Admitting: Gastroenterology

## 2014-09-01 NOTE — Telephone Encounter (Signed)
Pt was on recall list for 1 year repeat colon for polyp removed and a poor prep.

## 2014-09-16 ENCOUNTER — Encounter: Payer: Self-pay | Admitting: *Deleted

## 2014-09-16 ENCOUNTER — Telehealth: Payer: Self-pay | Admitting: Gastroenterology

## 2014-09-16 NOTE — Telephone Encounter (Signed)
Question about prep on Monday.

## 2014-09-16 NOTE — Telephone Encounter (Signed)
Questions answered regarding prep instructions.

## 2014-09-22 NOTE — Discharge Instructions (Signed)

## 2014-09-26 ENCOUNTER — Encounter: Payer: Self-pay | Admitting: *Deleted

## 2014-09-26 ENCOUNTER — Other Ambulatory Visit: Payer: Self-pay | Admitting: Gastroenterology

## 2014-09-26 ENCOUNTER — Ambulatory Visit
Admission: RE | Admit: 2014-09-26 | Discharge: 2014-09-26 | Disposition: A | Discharge status: Home / Self Care | Payer: Medicare Other | Source: Ambulatory Visit | Attending: Gastroenterology | Admitting: Gastroenterology

## 2014-09-26 ENCOUNTER — Encounter
Admission: RE | Disposition: A | Discharge status: Home / Self Care | Payer: Self-pay | Source: Ambulatory Visit | Attending: Gastroenterology

## 2014-09-26 ENCOUNTER — Ambulatory Visit: Payer: Medicare Other | Admitting: Anesthesiology

## 2014-09-26 DIAGNOSIS — Z90710 Acquired absence of both cervix and uterus: Secondary | ICD-10-CM | POA: Insufficient documentation

## 2014-09-26 DIAGNOSIS — Z880 Allergy status to penicillin: Secondary | ICD-10-CM | POA: Diagnosis not present

## 2014-09-26 DIAGNOSIS — Z820 Family history of epilepsy and other diseases of the nervous system: Secondary | ICD-10-CM | POA: Diagnosis not present

## 2014-09-26 DIAGNOSIS — Z818 Family history of other mental and behavioral disorders: Secondary | ICD-10-CM | POA: Insufficient documentation

## 2014-09-26 DIAGNOSIS — Z86010 Personal history of colonic polyps: Secondary | ICD-10-CM | POA: Insufficient documentation

## 2014-09-26 DIAGNOSIS — E559 Vitamin D deficiency, unspecified: Secondary | ICD-10-CM | POA: Diagnosis not present

## 2014-09-26 DIAGNOSIS — M4690 Unspecified inflammatory spondylopathy, site unspecified: Secondary | ICD-10-CM | POA: Insufficient documentation

## 2014-09-26 DIAGNOSIS — I10 Essential (primary) hypertension: Secondary | ICD-10-CM | POA: Insufficient documentation

## 2014-09-26 DIAGNOSIS — K219 Gastro-esophageal reflux disease without esophagitis: Secondary | ICD-10-CM | POA: Insufficient documentation

## 2014-09-26 DIAGNOSIS — Z881 Allergy status to other antibiotic agents status: Secondary | ICD-10-CM | POA: Diagnosis not present

## 2014-09-26 DIAGNOSIS — Z833 Family history of diabetes mellitus: Secondary | ICD-10-CM | POA: Diagnosis not present

## 2014-09-26 DIAGNOSIS — E785 Hyperlipidemia, unspecified: Secondary | ICD-10-CM | POA: Diagnosis not present

## 2014-09-26 DIAGNOSIS — Z860101 Personal history of adenomatous and serrated colon polyps: Secondary | ICD-10-CM

## 2014-09-26 DIAGNOSIS — Z79899 Other long term (current) drug therapy: Secondary | ICD-10-CM | POA: Diagnosis not present

## 2014-09-26 DIAGNOSIS — Z801 Family history of malignant neoplasm of trachea, bronchus and lung: Secondary | ICD-10-CM | POA: Diagnosis not present

## 2014-09-26 DIAGNOSIS — D123 Benign neoplasm of transverse colon: Secondary | ICD-10-CM | POA: Insufficient documentation

## 2014-09-26 DIAGNOSIS — K5730 Diverticulosis of large intestine without perforation or abscess without bleeding: Secondary | ICD-10-CM | POA: Insufficient documentation

## 2014-09-26 DIAGNOSIS — Z888 Allergy status to other drugs, medicaments and biological substances status: Secondary | ICD-10-CM | POA: Diagnosis not present

## 2014-09-26 DIAGNOSIS — Z8673 Personal history of transient ischemic attack (TIA), and cerebral infarction without residual deficits: Secondary | ICD-10-CM | POA: Diagnosis not present

## 2014-09-26 DIAGNOSIS — Z8249 Family history of ischemic heart disease and other diseases of the circulatory system: Secondary | ICD-10-CM | POA: Insufficient documentation

## 2014-09-26 DIAGNOSIS — Z8489 Family history of other specified conditions: Secondary | ICD-10-CM | POA: Diagnosis not present

## 2014-09-26 DIAGNOSIS — F419 Anxiety disorder, unspecified: Secondary | ICD-10-CM | POA: Insufficient documentation

## 2014-09-26 HISTORY — DX: Adverse effect of unspecified anesthetic, initial encounter: T41.45XA

## 2014-09-26 HISTORY — DX: Presence of spectacles and contact lenses: Z97.3

## 2014-09-26 HISTORY — PX: COLONOSCOPY WITH PROPOFOL: SHX5780

## 2014-09-26 HISTORY — PX: POLYPECTOMY: SHX5525

## 2014-09-26 HISTORY — DX: Dorsalgia, unspecified: M54.9

## 2014-09-26 HISTORY — DX: Dizziness and giddiness: R42

## 2014-09-26 HISTORY — DX: Myoneural disorder, unspecified: G70.9

## 2014-09-26 HISTORY — DX: Other complications of anesthesia, initial encounter: T88.59XA

## 2014-09-26 HISTORY — DX: Cerebral infarction, unspecified: I63.9

## 2014-09-26 HISTORY — DX: Unspecified osteoarthritis, unspecified site: M19.90

## 2014-09-26 SURGERY — COLONOSCOPY WITH PROPOFOL
Anesthesia: Monitor Anesthesia Care | Wound class: Contaminated

## 2014-09-26 MED ORDER — PROPOFOL 10 MG/ML IV BOLUS
INTRAVENOUS | Status: DC | PRN
  Administered 2014-09-26: 20 mg via INTRAVENOUS
  Administered 2014-09-26 (×2): 40 mg via INTRAVENOUS
  Administered 2014-09-26: 60 mg via INTRAVENOUS
  Administered 2014-09-26: 100 mg via INTRAVENOUS

## 2014-09-26 MED ORDER — LIDOCAINE HCL (CARDIAC) 20 MG/ML IV SOLN
INTRAVENOUS | Status: DC | PRN
  Administered 2014-09-26: 30 mg via INTRAVENOUS

## 2014-09-26 MED ORDER — ACETAMINOPHEN 325 MG PO TABS
325.0000 mg | ORAL_TABLET | ORAL | Status: DC | PRN
Start: 2014-09-26 — End: 2014-09-26

## 2014-09-26 MED ORDER — STERILE WATER FOR IRRIGATION IR SOLN
Status: DC | PRN
  Administered 2014-09-26: 10:00:00

## 2014-09-26 MED ORDER — ACETAMINOPHEN 160 MG/5ML PO SOLN: 325.0000 mg | ORAL | Status: DC | PRN

## 2014-09-26 MED ORDER — LACTATED RINGERS IV SOLN
INTRAVENOUS | Status: DC
  Administered 2014-09-26 (×3): via INTRAVENOUS

## 2014-09-26 SURGICAL SUPPLY — 28 items
CANISTER SUCT 1200ML W/VALVE (MISCELLANEOUS) ×4 IMPLANT
FCP ESCP3.2XJMB 240X2.8X (MISCELLANEOUS)
FORCEPS BIOP RAD 4 LRG CAP 4 (CUTTING FORCEPS) ×2 IMPLANT
FORCEPS BIOP RJ4 240 W/NDL (MISCELLANEOUS)
FORCEPS ESCP3.2XJMB 240X2.8X (MISCELLANEOUS) IMPLANT
GOWN CVR UNV OPN BCK APRN NK (MISCELLANEOUS) ×4 IMPLANT
GOWN ISOL THUMB LOOP REG UNIV (MISCELLANEOUS) ×8
HEMOCLIP INSTINCT (CLIP) IMPLANT
INJECTOR VARIJECT VIN23 (MISCELLANEOUS) IMPLANT
KIT CO2 TUBING (TUBING) IMPLANT
KIT DEFENDO VALVE AND CONN (KITS) IMPLANT
KIT ENDO PROCEDURE OLY (KITS) ×4 IMPLANT
LIGATOR MULTIBAND 6SHOOTER MBL (MISCELLANEOUS) IMPLANT
MARKER SPOT ENDO TATTOO 5ML (MISCELLANEOUS) IMPLANT
PAD GROUND ADULT SPLIT (MISCELLANEOUS) IMPLANT
SNARE SHORT THROW 13M SML OVAL (MISCELLANEOUS) ×2 IMPLANT
SNARE SHORT THROW 30M LRG OVAL (MISCELLANEOUS) IMPLANT
SPOT EX ENDOSCOPIC TATTOO (MISCELLANEOUS) ×2
SUCTION POLY TRAP 4CHAMBER (MISCELLANEOUS) IMPLANT
TRAP SUCTION POLY (MISCELLANEOUS) ×2 IMPLANT
TUBING CONN 6MMX3.1M (TUBING)
TUBING SUCTION CONN 0.25 STRL (TUBING) IMPLANT
UNDERPAD 30X60 958B10 (PK) (MISCELLANEOUS) IMPLANT
VALVE BIOPSY ENDO (VALVE) IMPLANT
VARIJECT INJECTOR VIN23 (MISCELLANEOUS) ×4
WATER AUXILLARY (MISCELLANEOUS) IMPLANT
WATER STERILE IRR 250ML POUR (IV SOLUTION) ×4 IMPLANT
WATER STERILE IRR 500ML POUR (IV SOLUTION) IMPLANT

## 2014-09-26 NOTE — Transfer of Care (Signed)
Immediate Anesthesia Transfer of Care Note  Patient: [name]  Procedure(s) Performed: Procedure(s): COLONOSCOPY WITH PROPOFOL (N/A) POLYPECTOMY  Patient Location: PACU  Anesthesia Type: MAC  Level of Consciousness: awake, alert  and patient cooperative  Airway and Oxygen Therapy: Patient Spontanous Breathing and Patient connected to supplemental oxygen  Post-op Assessment: Post-op Vital signs reviewed, Patient's Cardiovascular Status Stable, Respiratory Function Stable, Patent Airway and No signs of Nausea or vomiting  Post-op Vital Signs: Reviewed and stable  Complications: No apparent anesthesia complications

## 2014-09-26 NOTE — H&P (Signed)
[facility]  [address] [place], [place] [address] Phone: [phone] Fax : [phone]  Primary Care Physician:  Lelon Huh, MD Primary Gastroenterologist:  Dr. Allen Norris  Pre-Procedure History & Physical: HPI:  [name] is a 69 y.o. female is here for an colonoscopy.   Past Medical History  Diagnosis Date  . Anxiety   . GERD (gastroesophageal reflux disease)   . Hyperlipidemia   . Hypertension   . Vitamin D deficiency   . Complication of anesthesia     makes hair brittle  . Stroke     "mini - strokes" 2009 - no deficit  . Arthritis     lower spine  . Back pain     lower back - S/P lifting injury  . Neuromuscular disorder     numbness legs and feet s/p lower back injury  . Vertigo     none recently  . Wears contact lenses     Past Surgical History  Procedure Laterality Date  . Colon surgery  03/12/2012  . Cardiac catheterization  02/2007  . Abdominal hysterectomy  1987  . Tubal ligation  1972    Prior to Admission medications   Medication Sig Start Date End Date Taking? Authorizing Provider  ALPRAZolam Duanne Moron) 0.5 MG tablet Take 1 tablet (0.5 mg total) by mouth 2 (two) times daily as needed. As needed for anxiety 08/23/14  Yes Carmon Ginsberg, PA  aspirin 81 MG tablet Take by mouth.   Yes Historical Provider, MD  metoprolol succinate (TOPROL-XL) 25 MG 24 hr tablet Take 25 mg by mouth daily. 06/16/14  Yes Historical Provider, MD  omeprazole (PRILOSEC) 20 MG capsule Take 20 mg by mouth daily as needed.   Yes Historical Provider, MD  pravastatin (PRAVACHOL) 80 MG tablet TAKE 1 TABLET AT BEDTIME 07/21/14  Yes Birdie Sons, MD  Probiotic CAPS Take by mouth.   Yes Historical Provider, MD  sulfacetamide (BLEPH-10) 10 % ophthalmic solution INSTILL 2 DROPS INTO AFFECTED EYE 4 TIMES A DAY 07/14/14  Yes Historical Provider, MD    Allergies as of 09/01/2014 - Review Complete 08/01/2014  Allergen Reaction Noted  . Amoxicillin  08/01/2014  .  Calcium channel blockers  08/01/2014  . Penicillins  08/01/2014    Family History  Problem Relation Age of Onset  . Diabetes Mother   . Heart disease Mother   . Hypertension Mother   . Mental illness Mother   . Cancer Father     lung cancer  . Drug abuse Brother   . Multiple sclerosis Brother     History   Social History  . Marital Status: Married    Spouse Name: N/A  . Number of Children: N/A  . Years of Education: N/A   Occupational History  . Not on file.   Social History Main Topics  . Smoking status: Never Smoker   . Smokeless tobacco: Not on file  . Alcohol Use: No  . Drug Use: Not on file  . Sexual Activity: Not on file   Other Topics Concern  . Not on file   Social History Narrative    Review of Systems: See HPI, otherwise negative ROS  Physical Exam: BP 135/95 mmHg  Pulse 84  Temp(Src) 97.9 F (36.6 C)  Resp 18  Ht 5\' 3"  (1.6 m)  Wt 151 lb (68.493 kg)  BMI 26.76 kg/m2  SpO2 98% General:   Alert,  pleasant and cooperative in NAD Head:  Normocephalic and atraumatic. Neck:  Supple; no masses or thyromegaly. Lungs:  Clear throughout to auscultation.    Heart:  Regular rate and rhythm. Abdomen:  Soft, nontender and nondistended. Normal bowel sounds, without guarding, and without rebound.   Neurologic:  Alert and  oriented x4;  grossly normal neurologically.  Impression/Plan: [name] is here for an colonoscopy to be performed for history of colon polyp  Risks, benefits, limitations, and alternatives regarding  colonoscopy have been reviewed with the patient.  Questions have been answered.  All parties agreeable.   Ollen Bowl, MD  09/26/2014, 9:34 AM

## 2014-09-26 NOTE — Anesthesia Postprocedure Evaluation (Signed)
  Anesthesia Post-op Note  Patient: [name]  Procedure(s) Performed: Procedure(s) with comments: COLONOSCOPY WITH PROPOFOL (N/A) - marker (tattoo) used in colon POLYPECTOMY  Anesthesia type:MAC  Patient location: PACU  Post pain: Pain level controlled  Post assessment: Post-op Vital signs reviewed, Patient's Cardiovascular Status Stable, Respiratory Function Stable, Patent Airway and No signs of Nausea or vomiting  Post vital signs: Reviewed and stable  Last Vitals:  Filed Vitals:   09/26/14 1045  BP: 131/89  Pulse: 86  Temp:   Resp: 25    Level of consciousness: awake, alert  and patient cooperative  Complications: No apparent anesthesia complications

## 2014-09-26 NOTE — Op Note (Signed)
[facility] Patient Name: [name] Procedure Date: 09/26/2014 9:59 AM MRN: [id number] Account #: [id number] Date of Birth: 09-Jun-1945 Admit Type: Outpatient Age: 69 Room: [facility] OR ROOM 01 Gender: Female Note Status: Finalized Procedure:         Colonoscopy Indications:       High risk colon cancer surveillance: Personal history of                     colonic polyps Providers:         Lucilla Lame, MD Referring MD:      [name]. [name], MD (Referring MD) Medicines:         Propofol per Anesthesia Complications:     No immediate complications. Procedure:         Pre-Anesthesia Assessment:                    - Prior to the procedure, a History and Physical was                     performed, and patient medications and allergies were                     reviewed. The patient's tolerance of previous anesthesia                     was also reviewed. The risks and benefits of the procedure                     and the sedation options and risks were discussed with the                     patient. All questions were answered, and informed consent                     was obtained. Prior Anticoagulants: The patient has taken                     no previous anticoagulant or antiplatelet agents. ASA                     Grade Assessment: II - A patient with mild systemic                     disease. After reviewing the risks and benefits, the                     patient was deemed in satisfactory condition to undergo                     the procedure.                    After obtaining informed consent, the colonoscope was                     passed under direct vision. Throughout the procedure, the                     patient's blood pressure, pulse, and oxygen saturations                     were monitored continuously. The [place]  colonoscope (S#: [id number]) was introduced through the anus                     and advanced to  the the cecum, identified by appendiceal                     orifice and ileocecal valve. The colonoscopy was performed                     without difficulty. The patient tolerated the procedure                     well. The quality of the bowel preparation was excellent. Findings:      The perianal and digital rectal examinations were normal.      A 20 mm polyp was found in the transverse colon at the anastomosis. The       polyp was sessile. The polyp was removed with a cold snare. Polyp       resection was incomplete. Biopsies were taken with a cold forceps for       histology. Area was tattooed with an injection of [place] ink.      Multiple small-mouthed diverticula were found in the sigmoid colon. Impression:        - One 20 mm polyp in the transverse colon at the                     anastomosis. Incomplete resection. Biopsied. Tattooed.                    - Diverticulosis in the sigmoid colon. Recommendation:    - Await pathology results.                    - Return to GI clinic in 2 weeks.                    - If aadenomatous then surgical resection. Procedure Code(s): --- Professional ---                    [phone], Colonoscopy, flexible; with removal of tumor(s),                     polyp(s), or other lesion(s) by snare technique                    [id number], Colonoscopy, flexible; with directed submucosal                     injection(s), any substance Diagnosis Code(s): --- Professional ---                    Z86.010, Personal history of colonic polyps                    D12.3, Benign neoplasm of transverse colon                    K57.30, Diverticulosis of large intestine without                     perforation or abscess without bleeding CPT copyright 2014 American Medical Association. All rights reserved. The codes documented in this report are preliminary and upon coder review may  be revised to meet current compliance requirements. Lucilla Lame, MD 09/26/2014 10:32:48 AM This  report has  been signed electronically. Number of Addenda: 0 Note Initiated On: 09/26/2014 9:59 AM Scope Withdrawal Time: 0 hours 10 minutes 48 seconds  Total Procedure Duration: 0 hours 20 minutes 9 seconds       [facility]

## 2014-09-26 NOTE — Anesthesia Procedure Notes (Signed)
Procedure Name: MAC Performed by: Jami Ohlin Pre-anesthesia Checklist: Patient identified, Emergency Drugs available, Suction available, Patient being monitored and Timeout performed Patient Re-evaluated:Patient Re-evaluated prior to inductionOxygen Delivery Method: Nasal cannula Preoxygenation: Pre-oxygenation with 100% oxygen       

## 2014-09-26 NOTE — Anesthesia Preprocedure Evaluation (Signed)
Anesthesia Evaluation  Patient identified by MRN, date of birth, ID band  Reviewed: Allergy & Precautions, H&P , NPO status , Patient's Chart, lab work & pertinent test results  Airway Mallampati: II  TM Distance: >3 FB Neck ROM: full    Dental no notable dental hx.    Pulmonary    Pulmonary exam normal       Cardiovascular hypertension, Rhythm:regular Rate:Normal     Neuro/Psych    GI/Hepatic GERD-  ,  Endo/Other    Renal/GU      Musculoskeletal   Abdominal   Peds  Hematology   Anesthesia Other Findings   Reproductive/Obstetrics                             Anesthesia Physical Anesthesia Plan  ASA: II  Anesthesia Plan: MAC   Post-op Pain Management:    Induction:   Airway Management Planned:   Additional Equipment:   Intra-op Plan:   Post-operative Plan:   Informed Consent: I have reviewed the patients History and Physical, chart, labs and discussed the procedure including the risks, benefits and alternatives for the proposed anesthesia with the patient or authorized representative who has indicated his/her understanding and acceptance.     Plan Discussed with: CRNA  Anesthesia Plan Comments:         Anesthesia Quick Evaluation

## 2014-09-27 ENCOUNTER — Encounter: Payer: Self-pay | Admitting: Gastroenterology

## 2014-09-29 ENCOUNTER — Telehealth: Payer: Self-pay

## 2014-09-29 ENCOUNTER — Telehealth: Payer: Self-pay | Admitting: Gastroenterology

## 2014-09-29 NOTE — Telephone Encounter (Signed)
Pt notified of results. Added to recall list. Pt cancelled appt.

## 2014-09-29 NOTE — Telephone Encounter (Signed)
Patient has a question to ask.

## 2014-09-29 NOTE — Telephone Encounter (Signed)
-----   Message from Lucilla Lame, MD sent at 09/28/2014 11:20 AM EDT ----- Let the patient know that the polyp showed only inflammation without any precancerous tissue. She needs another colonoscopy in 3 years. The patient has an office visit which could be canceled if she doesn't have any questions. The polyp was just inflammation without any worrisome features. Please call the patient let them know the results.

## 2014-10-20 ENCOUNTER — Ambulatory Visit (INDEPENDENT_AMBULATORY_CARE_PROVIDER_SITE_OTHER): Payer: Medicare Other | Admitting: Family Medicine

## 2014-10-20 ENCOUNTER — Encounter: Payer: Self-pay | Admitting: Family Medicine

## 2014-10-20 ENCOUNTER — Other Ambulatory Visit: Payer: Self-pay

## 2014-10-20 VITALS — BP 110/64 | HR 70 | Temp 97.9°F | Resp 14 | Wt 156.4 lb

## 2014-10-20 DIAGNOSIS — L304 Erythema intertrigo: Secondary | ICD-10-CM | POA: Insufficient documentation

## 2014-10-20 DIAGNOSIS — K219 Gastro-esophageal reflux disease without esophagitis: Secondary | ICD-10-CM | POA: Insufficient documentation

## 2014-10-20 DIAGNOSIS — M545 Low back pain, unspecified: Secondary | ICD-10-CM

## 2014-10-20 DIAGNOSIS — E559 Vitamin D deficiency, unspecified: Secondary | ICD-10-CM | POA: Diagnosis not present

## 2014-10-20 DIAGNOSIS — R5383 Other fatigue: Secondary | ICD-10-CM

## 2014-10-20 DIAGNOSIS — Z87898 Personal history of other specified conditions: Secondary | ICD-10-CM | POA: Insufficient documentation

## 2014-10-20 DIAGNOSIS — G8929 Other chronic pain: Secondary | ICD-10-CM

## 2014-10-20 MED ORDER — HYDROCODONE-ACETAMINOPHEN 5-325 MG PO TABS: ORAL_TABLET | ORAL | Status: DC

## 2014-10-20 NOTE — Patient Instructions (Signed)
We will call you with the lab results. 

## 2014-10-20 NOTE — Progress Notes (Signed)
Subjective:     Patient ID: [name], female   DOB: [birth year], 69 y.o.   MRN: [id number]  HPI  Chief Complaint  Patient presents with  . Fatigue    Patient comes in office today with concerns of weakness and fatigue for the past two weeks. Patient states that symptoms began after she had her colonoscopy.   Reports they found an inflamed polyp. Denies current abdominal pain. Has previously been treated for Vitamin D deficiency but did not tolerate the large dose of medication (constipation).   Review of Systems  Gastrointestinal: Negative for diarrhea and blood in stool.  Neurological: Negative for dizziness and syncope.       Objective:   Physical Exam  Constitutional: She appears well-developed and well-nourished. No distress.  Cardiovascular: Normal rate and regular rhythm.   Pulmonary/Chest: Effort normal and breath sounds normal.  Abdominal: Soft. There is no tenderness. There is no guarding.       Assessment:    1. Vitamin D deficiency - Vitamin D (25 hydroxy)  2. Other fatigue - CBC with Differential/Platelet - Renal function panel   3.Chronic low back pain - HYDROcodone-acetaminophen (NORCO/VICODIN) 5-325 MG per tablet; One every 4-6 hours as needed for pain  Dispense: 28 tablet; Refill: 0      Plan:    Further f/u pending lab work.

## 2014-10-21 ENCOUNTER — Telehealth: Payer: Self-pay

## 2014-10-21 LAB — CBC WITH DIFFERENTIAL/PLATELET
Basophils Absolute: 0.1 10*3/uL (ref 0.0–0.2)
Basos: 1 %
EOS (ABSOLUTE): 0.3 10*3/uL (ref 0.0–0.4)
Eos: 4 %
Hematocrit: 45.2 % (ref 34.0–46.6)
Hemoglobin: 14.9 g/dL (ref 11.1–15.9)
Immature Grans (Abs): 0 10*3/uL (ref 0.0–0.1)
Immature Granulocytes: 0 %
Lymphocytes Absolute: 3.3 10*3/uL — ABNORMAL HIGH (ref 0.7–3.1)
Lymphs: 48 %
MCH: 30.1 pg (ref 26.6–33.0)
MCHC: 33 g/dL (ref 31.5–35.7)
MCV: 91 fL (ref 79–97)
Monocytes Absolute: 0.5 10*3/uL (ref 0.1–0.9)
Monocytes: 7 %
Neutrophils Absolute: 2.7 10*3/uL (ref 1.4–7.0)
Neutrophils: 40 %
Platelets: 270 10*3/uL (ref 150–379)
RBC: 4.95 x10E6/uL (ref 3.77–5.28)
RDW: 13.4 % (ref 12.3–15.4)
WBC: 6.9 10*3/uL (ref 3.4–10.8)

## 2014-10-21 LAB — RENAL FUNCTION PANEL
Albumin: 4.1 g/dL (ref 3.6–4.8)
BUN/Creatinine Ratio: 19 (ref 11–26)
BUN: 15 mg/dL (ref 8–27)
CO2: 25 mmol/L (ref 18–29)
Calcium: 9.3 mg/dL (ref 8.7–10.3)
Chloride: 101 mmol/L (ref 97–108)
Creatinine, Ser: 0.8 mg/dL (ref 0.57–1.00)
GFR calc Af Amer: 89 mL/min/{1.73_m2} (ref >59)
GFR calc non Af Amer: 77 mL/min/{1.73_m2} (ref >59)
Glucose: 85 mg/dL (ref 65–99)
Phosphorus: 3 mg/dL (ref 2.5–4.5)
Potassium: 4.3 mmol/L (ref 3.5–5.2)
Sodium: 139 mmol/L (ref 134–144)

## 2014-10-21 LAB — VITAMIN D 25 HYDROXY (VIT D DEFICIENCY, FRACTURES): Vit D, 25-Hydroxy: 10.9 ng/mL — ABNORMAL LOW (ref 30.0–100.0)

## 2014-10-21 NOTE — Telephone Encounter (Signed)
Patient has been advised. KW 

## 2014-10-21 NOTE — Telephone Encounter (Signed)
-----   Message from Carmon Ginsberg, [place] sent at 10/21/2014  7:55 AM EDT ----- Labs are good (no anemia/sugar/kidneys/liver are normal) except Vitamin D is staying low. See if you can tolerate an over the counter dose of 800 units daily.

## 2014-10-27 ENCOUNTER — Ambulatory Visit: Payer: Self-pay | Admitting: Gastroenterology

## 2014-11-25 NOTE — Telephone Encounter (Signed)
Please see Gingers note in another encounter

## 2015-01-10 ENCOUNTER — Other Ambulatory Visit: Payer: Self-pay | Admitting: Family Medicine

## 2015-01-10 DIAGNOSIS — M545 Low back pain, unspecified: Secondary | ICD-10-CM

## 2015-01-10 DIAGNOSIS — G8929 Other chronic pain: Secondary | ICD-10-CM

## 2015-01-10 NOTE — Telephone Encounter (Signed)
Pt contacted office for refill request on the following medications: °HYDROcodone-acetaminophen (NORCO/VICODIN) 5-325 MG per tablet Thanks TNP °

## 2015-01-11 MED ORDER — HYDROCODONE-ACETAMINOPHEN 5-325 MG PO TABS
ORAL_TABLET | ORAL | Status: DC
Start: 2015-01-11 — End: 2015-03-22

## 2015-03-06 ENCOUNTER — Ambulatory Visit: Payer: Self-pay | Admitting: Family Medicine

## 2015-03-06 ENCOUNTER — Telehealth: Payer: Self-pay

## 2015-03-06 NOTE — Telephone Encounter (Signed)
Patient called office because she was scheduled for visit today at [facility] with complaints of shortness of breath, patient called back office for advise on what to do because her symptoms were worse. Patient states she has chest pain, shortness of breath, weakness, blurred vision and pain radiating down her left arm. I advised patient that with those signs and symptoms I was worried that a cardiac event may happen and that she should seek immediate medical attention at the emergency room. Patient was very in and out on the phone and she complained of feeling tired, I addressed with patient if there was another party in the home who could contact EMS or drive her to ER? Patient states that her husband was taking her now. Appt was taken off your schedule.

## 2015-03-07 ENCOUNTER — Emergency Department: Payer: Medicare Other

## 2015-03-07 ENCOUNTER — Emergency Department
Admission: EM | Admit: 2015-03-07 | Discharge: 2015-03-07 | Disposition: A | Discharge status: Home / Self Care | Payer: Medicare Other | Attending: Emergency Medicine | Admitting: Emergency Medicine

## 2015-03-07 DIAGNOSIS — Z792 Long term (current) use of antibiotics: Secondary | ICD-10-CM | POA: Insufficient documentation

## 2015-03-07 DIAGNOSIS — R202 Paresthesia of skin: Secondary | ICD-10-CM | POA: Diagnosis not present

## 2015-03-07 DIAGNOSIS — Z7982 Long term (current) use of aspirin: Secondary | ICD-10-CM | POA: Insufficient documentation

## 2015-03-07 DIAGNOSIS — I10 Essential (primary) hypertension: Secondary | ICD-10-CM | POA: Diagnosis not present

## 2015-03-07 DIAGNOSIS — Z79899 Other long term (current) drug therapy: Secondary | ICD-10-CM | POA: Diagnosis not present

## 2015-03-07 DIAGNOSIS — Z880 Allergy status to penicillin: Secondary | ICD-10-CM | POA: Insufficient documentation

## 2015-03-07 DIAGNOSIS — R0602 Shortness of breath: Secondary | ICD-10-CM | POA: Diagnosis not present

## 2015-03-07 DIAGNOSIS — R002 Palpitations: Secondary | ICD-10-CM

## 2015-03-07 DIAGNOSIS — R209 Unspecified disturbances of skin sensation: Secondary | ICD-10-CM | POA: Diagnosis not present

## 2015-03-07 LAB — CBC
HCT: 44.4 % (ref 35.0–47.0)
Hemoglobin: 14.9 g/dL (ref 12.0–16.0)
MCH: 29.7 pg (ref 26.0–34.0)
MCHC: 33.5 g/dL (ref 32.0–36.0)
MCV: 88.7 fL (ref 80.0–100.0)
Platelets: 231 10*3/uL (ref 150–440)
RBC: 5.01 MIL/uL (ref 3.80–5.20)
RDW: 13.2 % (ref 11.5–14.5)
WBC: 6.1 10*3/uL (ref 3.6–11.0)

## 2015-03-07 LAB — BASIC METABOLIC PANEL
Anion gap: 8 (ref 5–15)
BUN: 12 mg/dL (ref 6–20)
CO2: 25 mmol/L (ref 22–32)
Calcium: 9.2 mg/dL (ref 8.9–10.3)
Chloride: 107 mmol/L (ref 101–111)
Creatinine, Ser: 0.75 mg/dL (ref 0.44–1.00)
GFR calc Af Amer: >60 mL/min (ref >60)
GFR calc non Af Amer: >60 mL/min (ref >60)
Glucose, Bld: 116 mg/dL — ABNORMAL HIGH (ref 65–99)
Potassium: 3.7 mmol/L (ref 3.5–5.1)
Sodium: 140 mmol/L (ref 135–145)

## 2015-03-07 LAB — TROPONIN I: Troponin I: <0.03 ng/mL (ref <0.031)

## 2015-03-07 MED ORDER — LORAZEPAM 1 MG PO TABS
1.0000 mg | ORAL_TABLET | Freq: Once | ORAL | Status: AC
  Administered 2015-03-07: 1 mg via ORAL
  Filled 2015-03-07: qty 1

## 2015-03-07 NOTE — ED Notes (Signed)
Patient is prescribed Xanax 0.25 mg po BID.  States has been under a lot of stress lately.  Yesterday patient took 0.25 mg in the morning and  0.5 mg in the evening, which relieved her anxiety.  Today patient took 0.25 mg this morning and took another 0.25 mg about 1 hour PTA.

## 2015-03-07 NOTE — Discharge Instructions (Signed)
You have been seen in the emergency department for heart palpitations as well as tingling in her hands and feet. Your workup as shown largely normal results including a chest x-ray and EKG. Please follow-up with her primary care physician in the next 1-2 days for recheck/reevaluation. Return to the emergency department for any worsening symptoms, or any other symptom personally concerning to your self.   Palpitations A palpitation is the feeling that your heartbeat is irregular or is faster than normal. It may feel like your heart is fluttering or skipping a beat. Palpitations are usually not a serious problem. However, in some cases, you may need further medical evaluation. CAUSES  Palpitations can be caused by:  Smoking.  Caffeine or other stimulants, such as diet pills or energy drinks.  Alcohol.  Stress and anxiety.  Strenuous physical activity.  Fatigue.  Certain medicines.  Heart disease, especially if you have a history of irregular heart rhythms (arrhythmias), such as atrial fibrillation, atrial flutter, or supraventricular tachycardia.  An improperly working pacemaker or defibrillator. DIAGNOSIS  To find the cause of your palpitations, your health care provider will take your medical history and perform a physical exam. Your health care provider may also have you take a test called an ambulatory electrocardiogram (ECG). An ECG records your heartbeat patterns over a 24-hour period. You may also have other tests, such as:  Transthoracic echocardiogram (TTE). During echocardiography, sound waves are used to evaluate how blood flows through your heart.  Transesophageal echocardiogram (TEE).  Cardiac monitoring. This allows your health care provider to monitor your heart rate and rhythm in real time.  Holter monitor. This is a portable device that records your heartbeat and can help diagnose heart arrhythmias. It allows your health care provider to track your heart activity for  several days, if needed.  Stress tests by exercise or by giving medicine that makes the heart beat faster. TREATMENT  Treatment of palpitations depends on the cause of your symptoms and can vary greatly. Most cases of palpitations do not require any treatment other than time, relaxation, and monitoring your symptoms. Other causes, such as atrial fibrillation, atrial flutter, or supraventricular tachycardia, usually require further treatment. HOME CARE INSTRUCTIONS   Avoid:  Caffeinated coffee, tea, soft drinks, diet pills, and energy drinks.  Chocolate.  Alcohol.  Stop smoking if you smoke.  Reduce your stress and anxiety. Things that can help you relax include:  A method of controlling things in your body, such as your heartbeats, with your mind (biofeedback).  Yoga.  Meditation.  Physical activity such as swimming, jogging, or walking.  Get plenty of rest and sleep. SEEK MEDICAL CARE IF:   You continue to have a fast or irregular heartbeat beyond 24 hours.  Your palpitations occur more often. SEEK IMMEDIATE MEDICAL CARE IF:  You have chest pain or shortness of breath.  You have a severe headache.  You feel dizzy or you faint. MAKE SURE YOU:  Understand these instructions.  Will watch your condition.  Will get help right away if you are not doing well or get worse.   This information is not intended to replace advice given to you by your health care provider. Make sure you discuss any questions you have with your health care provider.   Document Released: 02/02/2000 Document Revised: 02/09/2013 Document Reviewed: 04/05/2011 Elsevier Interactive Patient Education 2016 Elsevier Inc.  Paresthesia Paresthesia is an abnormal burning or prickling sensation. This sensation is generally felt in the hands, arms, legs, or feet.  However, it may occur in any part of the body. Usually, it is not painful. The feeling may be described as:  Tingling or numbness.  Pins and  needles.  Skin crawling.  Buzzing.  Limbs falling asleep.  Itching. Most people experience temporary (transient) paresthesia at some time in their lives. Paresthesia may occur when you breathe too quickly (hyperventilation). It can also occur without any apparent cause. Commonly, paresthesia occurs when pressure is placed on a nerve. The sensation quickly goes away after the pressure is removed. For some people, however, paresthesia is a long-lasting (chronic) condition that is caused by an underlying disorder. If you continue to have paresthesia, you may need further medical evaluation. HOME CARE INSTRUCTIONS Watch your condition for any changes. Taking the following actions may help to lessen any discomfort that you are feeling:  Avoid drinking alcohol.  Try acupuncture or massage to help relieve your symptoms.  Keep all follow-up visits as directed by your health care provider. This is important. SEEK MEDICAL CARE IF:  You continue to have episodes of paresthesia.  Your burning or prickling feeling gets worse when you walk.  You have pain, cramps, or dizziness.  You develop a rash. SEEK IMMEDIATE MEDICAL CARE IF:  You feel weak.  You have trouble walking or moving.  You have problems with speech, understanding, or vision.  You feel confused.  You cannot control your bladder or bowel movements.  You have numbness after an injury.  You faint.   This information is not intended to replace advice given to you by your health care provider. Make sure you discuss any questions you have with your health care provider.   Document Released: 01/25/2002 Document Revised: 06/21/2014 Document Reviewed: 01/31/2014 Elsevier Interactive Patient Education [employer].

## 2015-03-07 NOTE — ED Notes (Signed)
Pt to triage via w/c with no distress noted; pt reports since yesterday having sensation of heart fluttering with SOB; st hx panic attacks and symptoms resolved somewhat when taking anti-anxiety med; pt denies pain

## 2015-03-07 NOTE — ED Provider Notes (Signed)
[facility] Emergency Department Provider Note  Time seen: 10:19 PM  I have reviewed the triage vital signs and the nursing notes.   HISTORY  Chief Complaint Palpitations    HPI [name] is a 70 y.o. female with a past medical history of anxiety, gastric reflux, hyperlipidemia, hypertension, CVA, arthritis, presents the emergency department with palpitations, and tingling in her hands and feet. According to the patient starting last night she had tingling in her hands and feet and started feeling some fluttering sensations in her chest. States a history of anxiety which tends to present similarly. Patient took 0.5 mg of Xanax last night in her symptoms went away. Today she states the symptoms returned, she took 0.25 mg of Xanax however the symptoms continued to she came to the emergency room for evaluation. Denies any chest pain or shortness of breath. Denies nausea, vomiting, diaphoresis. States a history of sciatica with occasional pain and numbness in her left leg. Denies any numbness but does state tingling. Also states she typically gets tingling with her anxiety/panic attacks. Denies any chest pain now or at any time. Describes a tingling as mild, somewhat improved after taking the 0.25 mg of Xanax but did not go away completely.     Past Medical History  Diagnosis Date  . Anxiety   . GERD (gastroesophageal reflux disease)   . Hyperlipidemia   . Hypertension   . Vitamin D deficiency   . Complication of anesthesia     makes hair brittle  . Stroke     "mini - strokes" 2009 - no deficit  . Arthritis     lower spine  . Back pain     lower back - S/P lifting injury  . Neuromuscular disorder     numbness legs and feet s/p lower back injury  . Vertigo     none recently  . Wears contact lenses     Patient Active Problem List   Diagnosis Date Noted  . 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 Acid reflux 10/20/2014  . H/O disease 10/20/2014  . Hx of  colonic polyps   . Benign neoplasm of transverse colon   . Diverticulosis of large intestine without diverticulitis   . Allergic rhinitis 08/01/2014  . Anxiety 08/01/2014  . Esophageal reflux 08/01/2014  . History of endocrine, metabolic or immunity disorder 08/01/2014  . HLD (hyperlipidemia) 08/01/2014  . BP (high blood pressure) 08/01/2014  . Vitamin D deficiency 08/01/2014  . History of colonic polyps 01/22/2012  . H/O adenomatous polyp of colon 01/22/2012    Past Surgical History  Procedure Laterality Date  . Colon surgery  03/12/2012  . Cardiac catheterization  02/2007  . Abdominal hysterectomy  1987  . Tubal ligation  1972  . Colonoscopy with propofol N/A 09/26/2014    Procedure: COLONOSCOPY WITH PROPOFOL;  Surgeon: Lucilla Lame, MD;  Location: [facility];  Service: Endoscopy;  Laterality: N/A;  marker (tattoo) used in colon  . Polypectomy  09/26/2014    Procedure: POLYPECTOMY;  Surgeon: Lucilla Lame, MD;  Location: [facility];  Service: Endoscopy;;    Current Outpatient Rx  Name  Route  Sig  Dispense  Refill  . ALPRAZolam (XANAX) 0.5 MG tablet   Oral   Take 1 tablet (0.5 mg total) by mouth 2 (two) times daily as needed. As needed for anxiety   60 tablet   5   . aspirin 81 MG tablet   Oral   Take by mouth.         [name]  fluconazole (DIFLUCAN) 150 MG tablet      TAKE 1 TABLET ONCE A DAY ORALLY      1   . HYDROcodone-acetaminophen (NORCO/VICODIN) 5-325 MG tablet      One every 4-6 hours as needed for pain   28 tablet   0   . metoprolol succinate (TOPROL-XL) 25 MG 24 hr tablet   Oral   Take 25 mg by mouth daily.      4   . omeprazole (PRILOSEC) 20 MG capsule   Oral   Take 20 mg by mouth daily as needed.         . pravastatin (PRAVACHOL) 80 MG tablet      TAKE 1 TABLET AT BEDTIME   30 tablet   3   . Probiotic CAPS   Oral   Take by mouth.         . sulfacetamide (BLEPH-10) 10 % ophthalmic solution      INSTILL 2 DROPS INTO AFFECTED  EYE 4 TIMES A DAY      2     Allergies Amoxicillin; Calcium channel blockers; Penicillins; and Nickel  Family History  Problem Relation Age of Onset  . Diabetes Mother   . Heart disease Mother   . Hypertension Mother   . Mental illness Mother   . Cancer Father     lung cancer  . Drug abuse Brother   . Multiple sclerosis Brother     Social History Social History  Substance Use Topics  . Smoking status: Never Smoker   . Smokeless tobacco: Not on file  . Alcohol Use: No    Review of Systems Constitutional: Negative for fever. Cardiovascular: Negative for chest pain. Positive for palpitations. Respiratory: Negative for shortness of breath. Gastrointestinal: Negative for abdominal pain Neurological: Negative for headache 10-point ROS otherwise negative.  ____________________________________________   PHYSICAL EXAM:  VITAL SIGNS: ED Triage Vitals  Enc Vitals Group     BP 03/07/15 2056 145/80 mmHg     Pulse Rate 03/07/15 2056 82     Resp 03/07/15 2130 16     Temp 03/07/15 2056 97.9 F (36.6 C)     Temp Source 03/07/15 2056 Oral     SpO2 03/07/15 2056 97 %     Weight 03/07/15 2059 150 lb (68.04 kg)     Height 03/07/15 2059 5\' 3"  (1.6 m)     Head Cir --      Peak Flow --      Pain Score 03/07/15 2130 0     Pain Loc --      Pain Edu? --      Excl. in [place]? --     Constitutional: Alert and oriented. Well appearing and in no distress. Eyes: Normal exam ENT   Head: Normocephalic and atraumatic.   Mouth/Throat: Mucous membranes are moist. Cardiovascular: Normal rate, regular rhythm. No murmur Respiratory: Normal respiratory effort without tachypnea nor retractions. Breath sounds are clear and equal bilaterally. No wheezes/rales/rhonchi. Gastrointestinal: Soft and nontender. No distention.  Musculoskeletal: Nontender with normal range of motion in all extremities. No lower extremity tenderness or edema. Neurologic:  Normal speech and language. No gross  focal neurologic deficits  Skin:  Skin is warm, dry and intact.  Psychiatric: Mood and affect are normal. Speech and behavior are normal.   ____________________________________________    EKG  EKG reviewed and interpreted by myself shows normal sinus rhythm at 85 bpm, narrow QRS, normal axis, normal intervals, normal ST segments. Overall normal EKG.  INITIAL IMPRESSION / ASSESSMENT AND PLAN / ED COURSE  Pertinent labs & imaging results that were available during my care of the patient were reviewed by me and considered in my medical decision making (see chart for details).  Patient presents to the emergency department with tingling in her hands and feet, and intermittent heart palpitations. Patient states similar symptoms for the past anxiety reactions. States she did have a Xanax and as his symptoms did not go away she became concerned and came to the emergency department for evaluation. Patient's labs are within normal limits including negative troponin. Patient's chest x-ray is negative. Patient's EKG is reassuring. Patient states her symptoms have decreased since taking half a Xanax but remained mildly. We will dose Ativan in the emergency department and monitor for improvement. Patient agreeable to plan. ____________________________________________   FINAL CLINICAL IMPRESSION(S) / ED DIAGNOSES  palpitations   Harvest Dark, MD 03/07/15 2307

## 2015-03-07 NOTE — ED Notes (Signed)
AAOx3.  Skin warm and dry.  No SOB/ DOE.  Ambulates with easy and steady gait.  NAD

## 2015-03-22 ENCOUNTER — Other Ambulatory Visit: Payer: Self-pay | Admitting: Family Medicine

## 2015-03-22 DIAGNOSIS — G8929 Other chronic pain: Secondary | ICD-10-CM

## 2015-03-22 DIAGNOSIS — M545 Low back pain, unspecified: Secondary | ICD-10-CM

## 2015-03-22 MED ORDER — HYDROCODONE-ACETAMINOPHEN 5-325 MG PO TABS: ORAL_TABLET | ORAL | Status: DC

## 2015-03-22 NOTE — Telephone Encounter (Signed)
Pt needs refill HYDROcodone-acetaminophen (NORCO/VICODIN) 5-325 MG tablet 01/11/15 -- Birdie Sons, MD One every 4-6 hours as needed for    Thanks teri

## 2015-04-03 ENCOUNTER — Other Ambulatory Visit: Payer: Self-pay | Admitting: Family Medicine

## 2015-05-01 ENCOUNTER — Telehealth: Payer: Self-pay

## 2015-05-01 NOTE — Telephone Encounter (Signed)
I had recommended 800 units of Vitamin D daily. We did not get a Vitamin B12 level.

## 2015-05-01 NOTE — Telephone Encounter (Signed)
Patient had called office wanting to know how many milligrams she should take of Vitamin B12? I was looking through chart and notes and medication list but I did not see you mention anything about Vitamin B12 just Vitamin D. Patient states that it was not vitamin D you reccommended. Please review chart and advise. KW

## 2015-05-01 NOTE — Telephone Encounter (Signed)
Patient has been advised. KW 

## 2015-05-08 ENCOUNTER — Encounter: Payer: Self-pay | Admitting: Family Medicine

## 2015-05-08 ENCOUNTER — Ambulatory Visit (INDEPENDENT_AMBULATORY_CARE_PROVIDER_SITE_OTHER): Payer: Medicare Other | Admitting: Family Medicine

## 2015-05-08 VITALS — BP 130/92 | HR 68 | Temp 97.6°F | Resp 16 | Wt 158.2 lb

## 2015-05-08 DIAGNOSIS — E559 Vitamin D deficiency, unspecified: Secondary | ICD-10-CM | POA: Diagnosis not present

## 2015-05-08 DIAGNOSIS — F329 Major depressive disorder, single episode, unspecified: Secondary | ICD-10-CM

## 2015-05-08 DIAGNOSIS — F32A Depression, unspecified: Secondary | ICD-10-CM

## 2015-05-08 MED ORDER — VENLAFAXINE HCL ER 37.5 MG PO CP24: 37.5000 mg | ORAL_CAPSULE | Freq: Every day | ORAL | Status: DC

## 2015-05-08 NOTE — Progress Notes (Signed)
Subjective:     Patient ID: [name], female   DOB: 11-05-1945, 70 y.o.   MRN: [id number]  HPI  Chief Complaint  Patient presents with  . Fatigue    Patient comes in office today with cocnerns of fatigue for the past month. Patient reports that she is unsure if stress is related to her symptom of fatigue,but she has been under stress with the health of ther two children. Patient reports eldest child is currently going through chemotherapy, she states that she has been taking Xanax 0.5mg  BID.   [name] Leg Pain    Patient reports leg pain for the past year now, she states that she experinces discomfort on a daily basis no matter what activity she is doing at the time.   "I am very depressed". Denies suicidal thoughts. One son has Raynaud's/vasculitis while the other has what sounds like severe diverticulitis. She has just resumed Vitamin D at 1000 units for severe Vitamin D deficiency. States she has a numbness sensation from her foot to her upper leg ( prior lab evaluation 04/2014). Has been taking Xanax 2-3 x day at 1/2 to a whole pill. Wishes referral to a psychologist.   Review of Systems  Cardiovascular:       Reports she changed her diet and has been off bp and cholesterol medication for months.       Objective:   Physical Exam  Constitutional: She appears well-developed and well-nourished. No distress.  Cardiovascular:  Pulses:      Dorsalis pedis pulses are 2+ on the left side.       Posterior tibial pulses are 2+ on the left side.  Musculoskeletal: She exhibits no edema.  Left KF/KE/DF/PF: 5/5  Psychiatric: Her behavior is normal.  Depressed affect       Assessment:    1. Depression - venlafaxine XR (EFFEXOR XR) 37.5 MG 24 hr capsule; Take 1 capsule (37.5 mg total) by mouth daily with breakfast.  Dispense: 30 capsule; Refill: 0 - Ambulatory referral to Psychology  2. Vitamin D deficiency    Plan:    Continue Vitamin D; F/u in 2 weeks. Discussed side effects of  medication. May continue Xanax.

## 2015-05-08 NOTE — Patient Instructions (Signed)
Continue Vitamin D supplements

## 2015-05-10 ENCOUNTER — Telehealth: Payer: Self-pay

## 2015-05-10 NOTE — Telephone Encounter (Signed)
Opened in error. Emily Drozdowski, CMA  

## 2015-05-30 ENCOUNTER — Telehealth: Payer: Self-pay | Admitting: Family Medicine

## 2015-05-30 NOTE — Telephone Encounter (Signed)
Pt called in saying she has been having anxiety in the evening a lot lately.  She is on xanax and Effexor.  She takes the xanax in the am and at night.  She wants to know if she can take one in the afternoon also.  She also wanted to note that the Effexor is causing her to have headaches about an hr after taking.  She takes that at pm.  Please advise.  [phone]  Jeral Pinch

## 2015-06-03 ENCOUNTER — Other Ambulatory Visit: Payer: Self-pay | Admitting: Family Medicine

## 2015-06-05 NOTE — Telephone Encounter (Signed)
May take Xanax 3 x day. Stop venlafaxine and see if headaches stop. If the headaches stop off medication then we will try a different medication for depression. Let me know this week.

## 2015-06-05 NOTE — Telephone Encounter (Signed)
Pt is requesting [name] review this and call her back. [phone]

## 2015-06-05 NOTE — Telephone Encounter (Signed)
Patient has been advised. KW 

## 2015-06-08 ENCOUNTER — Ambulatory Visit (INDEPENDENT_AMBULATORY_CARE_PROVIDER_SITE_OTHER): Payer: Medicare Other | Admitting: Family Medicine

## 2015-06-08 ENCOUNTER — Encounter: Payer: Self-pay | Admitting: Family Medicine

## 2015-06-08 VITALS — BP 130/90 | HR 93 | Temp 98.2°F | Resp 16 | Wt 157.0 lb

## 2015-06-08 DIAGNOSIS — J069 Acute upper respiratory infection, unspecified: Secondary | ICD-10-CM

## 2015-06-08 MED ORDER — GUAIFENESIN-CODEINE 100-10 MG/5ML PO SOLN: 10.0000 mL | Freq: Four times a day (QID) | ORAL | Status: DC | PRN

## 2015-06-08 NOTE — Progress Notes (Signed)
Subjective:     Patient ID: [name], female   DOB: 1945/11/27, 70 y.o.   MRN: [id number]  HPI  Chief Complaint  Patient presents with  . Cough    Patient comes in office today with concerns of cough and congestion for the past 3 days. Patient states that cough is productive of yellow sputum. Assoicatiated with cough patient complains of sinus pain and pressure, shortness of breath and hoarsness. Patient has been taking otc Advil Cold& Sinus relief.   Reports scratchy throat initially.   Review of Systems  Psychiatric/Behavioral:       States headaches went away after stopping Venlafaxine.       Objective:   Physical Exam  Constitutional: She appears well-developed and well-nourished. No distress.  Ears: T.M's intact without inflammation Throat: no tonsillar enlargement or exudate Neck: no cervical adenopathy Lungs: clear     Assessment:    1. Upper respiratory infection - guaiFENesin-codeine 100-10 MG/5ML syrup; Take 10 mLs by mouth every 6 (six) hours as needed for cough.  Dispense: 240 mL; Refill: 0    Plan:    Continue current otc medication. May also try Delsym.

## 2015-06-08 NOTE — Patient Instructions (Signed)
Continue Advil cold and sinus. May also use Delsym for cough. Let me know if your sinuses are not improving by early next week. Once better from this we can work trying a different medication for depression.

## 2015-06-21 ENCOUNTER — Encounter: Payer: Self-pay | Admitting: Family Medicine

## 2015-06-21 ENCOUNTER — Other Ambulatory Visit: Payer: Self-pay | Admitting: Family Medicine

## 2015-06-21 ENCOUNTER — Ambulatory Visit (INDEPENDENT_AMBULATORY_CARE_PROVIDER_SITE_OTHER): Payer: Medicare Other | Admitting: Family Medicine

## 2015-06-21 VITALS — BP 130/84 | HR 76 | Temp 98.0°F | Resp 16 | Wt 154.8 lb

## 2015-06-21 DIAGNOSIS — N760 Acute vaginitis: Secondary | ICD-10-CM | POA: Diagnosis not present

## 2015-06-21 DIAGNOSIS — Z113 Encounter for screening for infections with a predominantly sexual mode of transmission: Secondary | ICD-10-CM

## 2015-06-21 MED ORDER — FLUCONAZOLE 150 MG PO TABS: 150.0000 mg | ORAL_TABLET | Freq: Once | ORAL | Status: DC

## 2015-06-21 NOTE — Progress Notes (Signed)
Subjective:     Patient ID: [name], female   DOB: 12-18-1945, 70 y.o.   MRN: [id number]  HPI  Chief Complaint  Patient presents with  . Vaginal Discharge    Patient comes in office today with cocnerns of vaginal dischage after taking left over Keflex for her sx. She took Diflucan prior to the onset of any symptoms thinking it would preclude them. Patient complains of milky white dischatge and itching around vulva area after urination.   Also admits to unprotected sex with a new partner a few weeks ago.     Review of Systems  Psychiatric/Behavioral:       Denies today that she is depressed. Did not get established with psychologist/counselor due to insurance difficulties.       Objective:   Physical Exam  Constitutional: She appears well-developed and well-nourished. She appears distressed (mild anxiety).       Assessment:    1. Vaginitis - Chlamydia/Gonococcus/Trichomonas, NAA - fluconazole (DIFLUCAN) 150 MG tablet; Take 1 tablet (150 mg total) by mouth once.  Dispense: 1 tablet; Refill: 1  2. Screen for STD (sexually transmitted disease) - Chlamydia/Gonococcus/Trichomonas, NAA    Plan:   Further f/u pending lab tests.

## 2015-06-21 NOTE — Patient Instructions (Signed)
We will call you with the lab results. 

## 2015-06-23 ENCOUNTER — Telehealth: Payer: Self-pay

## 2015-06-23 ENCOUNTER — Other Ambulatory Visit: Payer: Self-pay | Admitting: Family Medicine

## 2015-06-23 DIAGNOSIS — A599 Trichomoniasis, unspecified: Secondary | ICD-10-CM

## 2015-06-23 LAB — CHLAMYDIA/GONOCOCCUS/TRICHOMONAS, NAA
Chlamydia by NAA: NEGATIVE
Gonococcus by NAA: NEGATIVE
Trich vag by NAA: POSITIVE — AB

## 2015-06-23 MED ORDER — METRONIDAZOLE 500 MG PO TABS
500.0000 mg | ORAL_TABLET | Freq: Two times a day (BID) | ORAL | Status: DC
Start: 2015-06-23 — End: 2015-10-30

## 2015-06-23 MED ORDER — METRONIDAZOLE 500 MG PO TABS: 500.0000 mg | ORAL_TABLET | Freq: Two times a day (BID) | ORAL | Status: DC

## 2015-06-23 NOTE — Telephone Encounter (Signed)
Patient has been advised. KW 

## 2015-06-23 NOTE — Telephone Encounter (Signed)
Error =aa

## 2015-06-23 NOTE — Telephone Encounter (Signed)
Advised, pending report

## 2015-06-23 NOTE — Telephone Encounter (Signed)
-----   Message from Carmon Ginsberg, [place] sent at 06/23/2015  7:36 AM EDT ----- No chlamydia or gonorrhea; trichomonas is still pending

## 2015-07-20 ENCOUNTER — Telehealth: Payer: Self-pay | Admitting: Family Medicine

## 2015-07-20 NOTE — Telephone Encounter (Signed)
Pt called saying she has a jury summons for June 15 and she wants to know if you could write her an excuse due to anxiety.  She needs to have this by June 6th.  Her call back is [phone]  [facility]

## 2015-07-21 NOTE — Telephone Encounter (Signed)
Patient was notified.

## 2015-07-21 NOTE — Telephone Encounter (Signed)
Please review. Thanks!  

## 2015-07-21 NOTE — Telephone Encounter (Signed)
Letter has been printed  ?

## 2015-08-18 ENCOUNTER — Ambulatory Visit: Payer: Self-pay | Admitting: Family Medicine

## 2015-08-29 ENCOUNTER — Telehealth: Payer: Self-pay | Admitting: Family Medicine

## 2015-08-29 ENCOUNTER — Other Ambulatory Visit: Payer: Self-pay | Admitting: Family Medicine

## 2015-08-29 DIAGNOSIS — F419 Anxiety disorder, unspecified: Secondary | ICD-10-CM

## 2015-08-29 MED ORDER — ALPRAZOLAM 0.5 MG PO TABS: 0.5000 mg | ORAL_TABLET | Freq: Three times a day (TID) | ORAL | Status: DC | PRN

## 2015-08-29 NOTE — Telephone Encounter (Signed)
[facility]

## 2015-08-29 NOTE — Telephone Encounter (Signed)
Rx completed: patient may pick it up or you may call it in.

## 2015-08-29 NOTE — Telephone Encounter (Signed)
Pt contacted office for refill request on the following medications:  ALPRAZolam (XANAX) 0.5 MG tablet.  Pt is requesting a new Rx sent in due to now taking 3 pill a day.  Per pt this was approved by [name].  [facility].  [id number]

## 2015-10-03 DIAGNOSIS — H2513 Age-related nuclear cataract, bilateral: Secondary | ICD-10-CM | POA: Diagnosis not present

## 2015-10-16 ENCOUNTER — Other Ambulatory Visit: Payer: Self-pay | Admitting: Family Medicine

## 2015-10-16 DIAGNOSIS — N760 Acute vaginitis: Secondary | ICD-10-CM

## 2015-10-16 NOTE — Telephone Encounter (Signed)
Refill request for review. KW 

## 2015-10-30 ENCOUNTER — Encounter: Payer: Self-pay | Admitting: Family Medicine

## 2015-10-30 ENCOUNTER — Ambulatory Visit (INDEPENDENT_AMBULATORY_CARE_PROVIDER_SITE_OTHER): Payer: Medicare Other | Admitting: Family Medicine

## 2015-10-30 VITALS — BP 132/78 | HR 73 | Temp 98.0°F | Resp 14 | Wt 161.6 lb

## 2015-10-30 DIAGNOSIS — K5730 Diverticulosis of large intestine without perforation or abscess without bleeding: Secondary | ICD-10-CM | POA: Diagnosis not present

## 2015-10-30 DIAGNOSIS — R1032 Left lower quadrant pain: Secondary | ICD-10-CM | POA: Diagnosis not present

## 2015-10-30 DIAGNOSIS — Z86010 Personal history of colonic polyps: Secondary | ICD-10-CM

## 2015-10-30 NOTE — Progress Notes (Signed)
Patient: [name] Female    DOB: 1945-07-30   70 y.o.   MRN: [id number] Visit Date: 10/30/2015  Today's Provider: Vernie Murders, PA   Chief Complaint  Patient presents with  . Abdominal Pain   Subjective:    Abdominal Pain  This is a recurrent problem. The current episode started in the past 7 days. The problem has been unchanged. The pain is located in the generalized abdominal region. Quality: discomfort. Associated symptoms include nausea. Associated symptoms comments: Fatigue, sweats. The pain is aggravated by eating. The pain is relieved by belching and passing flatus. She has tried antacids for the symptoms. diverticulosis   Past Medical History:  Diagnosis Date  . Anxiety   . Arthritis    lower spine  . Back pain    lower back - S/P lifting injury  . Complication of anesthesia    makes hair brittle  . GERD (gastroesophageal reflux disease)   . Hyperlipidemia   . Hypertension   . Neuromuscular disorder (HCC)    numbness legs and feet s/p lower back injury  . Stroke [facility])    "mini - strokes" 2009 - no deficit  . Vertigo    none recently  . Vitamin D deficiency   . Wears contact lenses    Past Surgical History:  Procedure Laterality Date  . ABDOMINAL HYSTERECTOMY  1987  . CARDIAC CATHETERIZATION  02/2007  . COLON SURGERY  03/12/2012  . COLONOSCOPY WITH PROPOFOL N/A 09/26/2014   Procedure: COLONOSCOPY WITH PROPOFOL;  Surgeon: Lucilla Lame, MD;  Location: [place];  Service: Endoscopy;  Laterality: N/A;  marker (tattoo) used in colon  . POLYPECTOMY  09/26/2014   Procedure: POLYPECTOMY;  Surgeon: Lucilla Lame, MD;  Location: [place];  Service: Endoscopy;;  . TUBAL LIGATION  1972   Family History  Problem Relation Age of Onset  . Diabetes Mother   . Heart disease Mother   . Hypertension Mother   . Mental illness Mother   . Cancer Father     lung cancer  . Drug abuse Brother   . Multiple sclerosis Brother    Allergies  Allergen  Reactions  . Amoxicillin     Yeast infections  . Calcium Channel Blockers     Other reaction(s): Dizziness  . Penicillins Other (See Comments)    Yeast infection  . Nickel Rash     Previous Medications   ALPRAZOLAM (XANAX) 0.5 MG TABLET    Take 1 tablet (0.5 mg total) by mouth 3 (three) times daily as needed for anxiety.   ASPIRIN 81 MG TABLET    Take by mouth.   HYDROCODONE-ACETAMINOPHEN (NORCO/VICODIN) 5-325 MG TABLET    One every 4-6 hours as needed for pain   LIDOCAINE (XYLOCAINE) 2 % JELLY       OMEPRAZOLE (PRILOSEC) 20 MG CAPSULE    Take 20 mg by mouth daily as needed.   PROBIOTIC CAPS    Take by mouth.    Review of Systems  Constitutional: Positive for fatigue.  Respiratory: Negative.   Cardiovascular: Negative.   Gastrointestinal: Positive for abdominal pain and nausea.    Social History  Substance Use Topics  . Smoking status: Never Smoker  . Smokeless tobacco: Not on file  . Alcohol use No   Objective:   BP 132/78 (BP Location: Right Arm, Patient Position: Sitting, Cuff Size: Normal)   Pulse 73   Temp 98 F (36.7 C) (Oral)   Resp 14   Wt 161 lb  9.6 oz (73.3 kg)   SpO2 99%   BMI 28.63 kg/m   Physical Exam  Constitutional: She is oriented to person, place, and time. She appears well-developed and well-nourished. No distress.  HENT:  Head: Normocephalic and atraumatic.  Right Ear: Hearing normal.  Left Ear: Hearing normal.  Nose: Nose normal.  Eyes: Conjunctivae and lids are normal. Right eye exhibits no discharge. Left eye exhibits no discharge. No scleral icterus.  Neck: Neck supple.  Cardiovascular: Normal rate and regular rhythm.   Pulmonary/Chest: Effort normal and breath sounds normal. No respiratory distress.  Abdominal: Soft. Bowel sounds are normal. She exhibits no mass. There is no tenderness. There is no rebound and no guarding.  Musculoskeletal: Normal range of motion.  Neurological: She is alert and oriented to person, place, and time.    Skin: Skin is intact. No lesion and no rash noted.  Psychiatric: She has a normal mood and affect. Her speech is normal and behavior is normal. Thought content normal.      Assessment & Plan:     1. Left lower quadrant pain Onset a couple days ago. Described as a pain that cause her to sweat and will stop after she passes gas or has a bowel movement. Has a history of partial colectomy for a "precancerous" polyp in her colon found on colonoscopy by Dr. Dionne Milo Dec. 2013. Dr. Daisy Floro did the surgery on 03-12-12 with benign pathology. Had repeat colonoscopy on 09-26-14 by Dr. Allen Norris with a benign polyp near the area of anastomosis and some diverticulosis. No fever, diarrhea, hematochezia or vomiting with these episodes of discomfort. Well healed scar from epigastrium to suprapubic region for partial colectomy. No tenderness today, but, feels a little weak at times. Will get labs and given OC-Light kit to test for infection or blood in stools. May need CT scan if pain returns. Recommend liquid diet for 24-48 hours. Should go to ER if abdominal pain recurs and becomes worse. - CBC with Differential/Platelet - Comprehensive metabolic panel  2. Diverticulosis of large intestine without diverticulitis Documented on colonoscopy 09-26-14. - CBC with Differential/Platelet - Comprehensive metabolic panel  3. H/O adenomatous polyp of colon Identified on colonoscopy with surgical removal by Dr. Daisy Floro on 03-12-12 with path report of benign distal transverse colon mass without lymph node involvement.[name]

## 2015-10-31 ENCOUNTER — Telehealth: Payer: Self-pay

## 2015-10-31 LAB — CBC WITH DIFFERENTIAL/PLATELET
Basophils Absolute: 0 10*3/uL (ref 0.0–0.2)
Basos: 1 %
EOS (ABSOLUTE): 0.1 10*3/uL (ref 0.0–0.4)
Eos: 2 %
Hematocrit: 44.4 % (ref 34.0–46.6)
Hemoglobin: 15.1 g/dL (ref 11.1–15.9)
Immature Grans (Abs): 0 10*3/uL (ref 0.0–0.1)
Immature Granulocytes: 0 %
Lymphocytes Absolute: 3.1 10*3/uL (ref 0.7–3.1)
Lymphs: 50 %
MCH: 30.3 pg (ref 26.6–33.0)
MCHC: 34 g/dL (ref 31.5–35.7)
MCV: 89 fL (ref 79–97)
Monocytes Absolute: 0.5 10*3/uL (ref 0.1–0.9)
Monocytes: 8 %
Neutrophils Absolute: 2.4 10*3/uL (ref 1.4–7.0)
Neutrophils: 39 %
Platelets: 278 10*3/uL (ref 150–379)
RBC: 4.98 x10E6/uL (ref 3.77–5.28)
RDW: 13.6 % (ref 12.3–15.4)
WBC: 6.2 10*3/uL (ref 3.4–10.8)

## 2015-10-31 LAB — COMPREHENSIVE METABOLIC PANEL
ALT: 12 IU/L (ref 0–32)
AST: 15 IU/L (ref 0–40)
Albumin/Globulin Ratio: 1.7 (ref 1.2–2.2)
Albumin: 4.5 g/dL (ref 3.6–4.8)
Alkaline Phosphatase: 61 IU/L (ref 39–117)
BUN/Creatinine Ratio: 19 (ref 12–28)
BUN: 15 mg/dL (ref 8–27)
Bilirubin Total: 0.9 mg/dL (ref 0.0–1.2)
CO2: 24 mmol/L (ref 18–29)
Calcium: 9.3 mg/dL (ref 8.7–10.3)
Chloride: 102 mmol/L (ref 96–106)
Creatinine, Ser: 0.78 mg/dL (ref 0.57–1.00)
GFR calc Af Amer: 91 mL/min/{1.73_m2} (ref >59)
GFR calc non Af Amer: 79 mL/min/{1.73_m2} (ref >59)
Globulin, Total: 2.7 g/dL (ref 1.5–4.5)
Glucose: 87 mg/dL (ref 65–99)
Potassium: 3.5 mmol/L (ref 3.5–5.2)
Sodium: 142 mmol/L (ref 134–144)
Total Protein: 7.2 g/dL (ref 6.0–8.5)

## 2015-10-31 LAB — IFOBT (OCCULT BLOOD): IFOBT: NEGATIVE

## 2015-10-31 NOTE — Telephone Encounter (Signed)
Patient advised.

## 2015-10-31 NOTE — Telephone Encounter (Signed)
-----  Message from Margo Common, [place] sent at 10/31/2015  8:29 AM EDT ----- All blood tests are normal. Proceed with stool test for blood - collect at home with OC-Light kit given yesterday. If pain worsening or returning, will schedule for CT scan of abdomen and pelvis to rule out diverticulitis.

## 2015-10-31 NOTE — Addendum Note (Signed)
Addended by: Jules Schick on: 10/31/2015 11:58 AM   Modules accepted: Orders

## 2015-11-02 ENCOUNTER — Telehealth: Payer: Self-pay

## 2015-11-02 NOTE — Telephone Encounter (Signed)
-----   Message from Margo Common, [place] sent at 10/31/2015  5:35 PM EDT ----- Stool test negative for blood. If she continues to have LLQ abdominal discomfort, should schedule CT scan of abdomen and pelvis with contrast.

## 2015-11-02 NOTE — Telephone Encounter (Signed)
Pt advised.   Thanks,   -Laura  

## 2016-01-09 ENCOUNTER — Ambulatory Visit: Payer: Self-pay

## 2016-01-24 ENCOUNTER — Ambulatory Visit (INDEPENDENT_AMBULATORY_CARE_PROVIDER_SITE_OTHER): Payer: Medicare Other | Admitting: Physician Assistant

## 2016-01-24 ENCOUNTER — Encounter: Payer: Self-pay | Admitting: Physician Assistant

## 2016-01-24 VITALS — BP 142/84 | HR 72 | Temp 98.3°F | Resp 16 | Wt 157.4 lb

## 2016-01-24 DIAGNOSIS — M545 Low back pain: Secondary | ICD-10-CM | POA: Diagnosis not present

## 2016-01-24 DIAGNOSIS — G8929 Other chronic pain: Secondary | ICD-10-CM | POA: Diagnosis not present

## 2016-01-24 DIAGNOSIS — F419 Anxiety disorder, unspecified: Secondary | ICD-10-CM

## 2016-01-24 DIAGNOSIS — R5383 Other fatigue: Secondary | ICD-10-CM | POA: Diagnosis not present

## 2016-01-24 MED ORDER — HYDROCODONE-ACETAMINOPHEN 10-325 MG PO TABS: 1.0000 | ORAL_TABLET | Freq: Three times a day (TID) | ORAL | 0 refills | Status: DC | PRN

## 2016-01-24 MED ORDER — HYDROCODONE-ACETAMINOPHEN 5-325 MG PO TABS: 1.0000 | ORAL_TABLET | Freq: Three times a day (TID) | ORAL | 0 refills | Status: DC | PRN

## 2016-01-24 NOTE — Progress Notes (Signed)
Patient: [name] Female    DOB: May 13, 1945   70 y.o.   MRN: [id number] Visit Date: 01/24/2016  Today's Provider: Trinna Post, PA-C   Chief Complaint  Patient presents with  . Fatigue   Subjective:    HPI Patient complains of fatigue, headache and increased anxiety. She states symptoms started 2-3 weeks ago. She denies passing out, decreased appetite, fever, chills, chest pain, leg swelling. CBC and CMET from 10/2015 were normal. Patient reports she had low vitamin D last year, value from 09/2014 was 10. Patient was instructed to take large dose initially, which patient says made her "end up in the hospital" with constipation. Instructions on lab result were to take 800 IU vitamin D daily, which patient has not been taking.   Patient also complains of increased anxiety. Is taking 0.5 mg xanax daily. Wants to cut down and tried, but felt worse. She reports that her son has Raynaud's and has had some fingers removed. Additionally, her brother passed away two weeks ago. Denies SI/HI.  Additionally, patient reports 7/10 lower back pain in her left side. Reports no sciatica yet. Worse with movement. States she only takes narcotics when it gets really bad. Some tingling in her legs bilaterally. She has had this chronically. Lumbar myelogram in 2007 revealed L5-S1 bulging disc with caudal extension. Ct lumbar spine reveals facet degenerative changes in L4-L5 and L5-S1.     Previous Medications   ALPRAZOLAM (XANAX) 0.5 MG TABLET    Take 1 tablet (0.5 mg total) by mouth 3 (three) times daily as needed for anxiety.   ASPIRIN 81 MG TABLET    Take by mouth.   LIDOCAINE (XYLOCAINE) 2 % JELLY       OMEPRAZOLE (PRILOSEC) 20 MG CAPSULE    Take 20 mg by mouth daily as needed.   PROBIOTIC CAPS    Take by mouth.    Review of Systems  Constitutional: Positive for fatigue.  Respiratory: Negative.   Cardiovascular: Negative.   Neurological: Positive for light-headedness and headaches.    Psychiatric/Behavioral: The patient is nervous/anxious.     Social History  Substance Use Topics  . Smoking status: Never Smoker  . Smokeless tobacco: Not on file  . Alcohol use No   Objective:   BP (!) 142/84 (BP Location: Right Arm, Patient Position: Sitting, Cuff Size: Normal)   Pulse 72   Temp 98.3 F (36.8 C) (Oral)   Resp 16   Wt 157 lb 6.4 oz (71.4 kg)   BMI 27.88 kg/m   Physical Exam  Constitutional: She is oriented to person, place, and time. She appears well-developed and well-nourished.  Neck: No thyromegaly present.  Cardiovascular: Normal rate and regular rhythm.   Pulmonary/Chest: Effort normal and breath sounds normal.  Musculoskeletal: She exhibits no edema, tenderness or deformity.       Lumbar back: She exhibits pain. She exhibits normal range of motion, no tenderness, no bony tenderness, no swelling, no edema, no deformity, no laceration, no spasm and normal pulse.  Difficulty in getting to exam table.  Neurological: She is alert and oriented to person, place, and time. She has normal strength. No sensory deficit.  Skin: Skin is warm and dry.  Psychiatric: She has a normal mood and affect. Her behavior is normal.        Assessment & Plan:      Problem List Items Addressed This Visit      Other   Anxiety    Other Visit Diagnoses  Other fatigue    -  Primary   Relevant Orders   VITAMIN D 25 Hydroxy (Vit-D Deficiency, Fractures)   TSH   Chronic left-sided low back pain, with sciatica presence unspecified       Relevant Medications   HYDROcodone-acetaminophen (NORCO/VICODIN) 5-325 MG tablet     Patient is 70 y/o female presenting with fatigue, anxiety, and chronic low back pain. CBC and CMET in 10/2015 normal. Will get vitamin D and TSH. Have instructed patient to begin 800 IU Vitamin D daily. I think there may be a mood component to her fatigue as it seems to correspond to her brother's death, but will investigate further.  Patient has had  series of recent stressful events. Talked about maintenance medications for anxiety, like Lexapro. Patient wants to decrease xanax dose and has tried cutting pills, but recent events have prevented her success. Advised it may not be the best time to reduce her dose and we can revisit this at a later point when she is more stable.  Patient has low back pain with evidence of bulging disc on myelogram and Ct. Checked [facility] and patient got 28 Norco5/325 in 03/2015 and has not been prescribed medication since. Will give pain medication as above for intolerable pain, but for future needs have directed her to Dr. [name].   The entirety of the information documented in the History of Present Illness, Review of Systems and Physical Exam were personally obtained by me. Portions of this information were initially documented by Rozanna Box and reviewed by me for thoroughness and accuracy.    Return if symptoms worsen or fail to improve.  Patient Instructions  Fatigue Introduction Fatigue is feeling tired all of the time, a lack of energy, or a lack of motivation. Occasional or mild fatigue is often a normal response to activity or life in general. However, long-lasting (chronic) or extreme fatigue may indicate an underlying medical condition. Follow these instructions at home: Watch your fatigue for any changes. The following actions may help to lessen any discomfort you are feeling:  Talk to your health care provider about how much sleep you need each night. Try to get the required amount every night.  Take medicines only as directed by your health care provider.  Eat a healthy and nutritious diet. Ask your health care provider if you need help changing your diet.  Drink enough fluid to keep your urine clear or pale yellow.  Practice ways of relaxing, such as yoga, meditation, massage therapy, or acupuncture.  Exercise regularly.  Change situations that cause you stress. Try to keep your work and  personal routine reasonable.  Do not abuse illegal drugs.  Limit alcohol intake to no more than 1 drink per day for nonpregnant women and 2 drinks per day for men. One drink equals 12 ounces of beer, 5 ounces of wine, or 1 ounces of hard liquor.  Take a multivitamin, if directed by your health care provider. Contact a health care provider if:  Your fatigue does not get better.  You have a fever.  You have unintentional weight loss or gain.  You have headaches.  You have difficulty:  Falling asleep.  Sleeping throughout the night.  You feel angry, guilty, anxious, or sad.  You are unable to have a bowel movement (constipation).  You skin is dry.  Your legs or another part of your body is swollen. Get help right away if:  You feel confused.  Your vision is blurry.  You feel  faint or pass out.  You have a severe headache.  You have severe abdominal, pelvic, or back pain.  You have chest pain, shortness of breath, or an irregular or fast heartbeat.  You are unable to urinate or you urinate less than normal.  You develop abnormal bleeding, such as bleeding from the rectum, vagina, nose, lungs, or nipples.  You vomit blood.  You have thoughts about harming yourself or committing suicide.  You are worried that you might harm someone else. This information is not intended to replace advice given to you by your health care provider. Make sure you discuss any questions you have with your health care provider. Document Released: 12/02/2006 Document Revised: 07/13/2015 Document Reviewed: 06/08/2013  2017 Elsevier      Follow up: Return if symptoms worsen or fail to improve.

## 2016-01-24 NOTE — Patient Instructions (Signed)
Fatigue Introduction Fatigue is feeling tired all of the time, a lack of energy, or a lack of motivation. Occasional or mild fatigue is often a normal response to activity or life in general. However, long-lasting (chronic) or extreme fatigue may indicate an underlying medical condition. Follow these instructions at home: Watch your fatigue for any changes. The following actions may help to lessen any discomfort you are feeling:  Talk to your health care provider about how much sleep you need each night. Try to get the required amount every night.  Take medicines only as directed by your health care provider.  Eat a healthy and nutritious diet. Ask your health care provider if you need help changing your diet.  Drink enough fluid to keep your urine clear or pale yellow.  Practice ways of relaxing, such as yoga, meditation, massage therapy, or acupuncture.  Exercise regularly.  Change situations that cause you stress. Try to keep your work and personal routine reasonable.  Do not abuse illegal drugs.  Limit alcohol intake to no more than 1 drink per day for nonpregnant women and 2 drinks per day for men. One drink equals 12 ounces of beer, 5 ounces of wine, or 1 ounces of hard liquor.  Take a multivitamin, if directed by your health care provider. Contact a health care provider if:  Your fatigue does not get better.  You have a fever.  You have unintentional weight loss or gain.  You have headaches.  You have difficulty:  Falling asleep.  Sleeping throughout the night.  You feel angry, guilty, anxious, or sad.  You are unable to have a bowel movement (constipation).  You skin is dry.  Your legs or another part of your body is swollen. Get help right away if:  You feel confused.  Your vision is blurry.  You feel faint or pass out.  You have a severe headache.  You have severe abdominal, pelvic, or back pain.  You have chest pain, shortness of breath, or an  irregular or fast heartbeat.  You are unable to urinate or you urinate less than normal.  You develop abnormal bleeding, such as bleeding from the rectum, vagina, nose, lungs, or nipples.  You vomit blood.  You have thoughts about harming yourself or committing suicide.  You are worried that you might harm someone else. This information is not intended to replace advice given to you by your health care provider. Make sure you discuss any questions you have with your health care provider. Document Released: 12/02/2006 Document Revised: 07/13/2015 Document Reviewed: 06/08/2013  2017 Elsevier  

## 2016-01-25 LAB — TSH: TSH: 0.822 u[IU]/mL (ref 0.450–4.500)

## 2016-01-25 LAB — VITAMIN D 25 HYDROXY (VIT D DEFICIENCY, FRACTURES): Vit D, 25-Hydroxy: 31 ng/mL (ref 30.0–100.0)

## 2016-01-29 ENCOUNTER — Encounter: Payer: Self-pay | Admitting: Gastroenterology

## 2016-01-29 ENCOUNTER — Ambulatory Visit (INDEPENDENT_AMBULATORY_CARE_PROVIDER_SITE_OTHER): Payer: Medicare Other | Admitting: Gastroenterology

## 2016-01-29 VITALS — BP 146/86 | HR 74 | Temp 98.0°F | Ht 63.0 in | Wt 157.0 lb

## 2016-01-29 DIAGNOSIS — R1013 Epigastric pain: Secondary | ICD-10-CM

## 2016-01-29 NOTE — Progress Notes (Signed)
Primary Care Physician: Lelon Huh, MD  Primary Gastroenterologist:  Dr. Lucilla Lame  Chief Complaint  Patient presents with  . Food not going down    HPI: [name] is a 70 y.o. female here because of food feeling stuck in her abdomen. The patient reports that she was feeling full after eating. She said this occurred up until Saturday when she had a bowel movement and states that the feeling went away completely. She wasn't really keep this appointment but she states that she did not feel it was right to cancel the appointment. There is no report of any unexplained weight loss fevers chills nausea vomiting. The patient states that she commonly feels this fullness in her stomach and she doesn't move her bowels.  Current Outpatient Prescriptions  Medication Sig Dispense Refill  . ALPRAZolam (XANAX) 0.5 MG tablet Take 1 tablet (0.5 mg total) by mouth 3 (three) times daily as needed for anxiety. 90 tablet 5  . aspirin 81 MG tablet Take by mouth.    [name] HYDROcodone-acetaminophen (NORCO/VICODIN) 5-325 MG tablet Take 1 tablet by mouth every 8 (eight) hours as needed for moderate pain. 15 tablet 0  . omeprazole (PRILOSEC) 20 MG capsule Take 20 mg by mouth daily as needed.    . Probiotic CAPS Take by mouth.    . lidocaine (XYLOCAINE) 2 % jelly      No current facility-administered medications for this visit.     Allergies as of 01/29/2016 - Review Complete 01/29/2016  Allergen Reaction Noted  . Amoxicillin  08/01/2014  . Calcium channel blockers  08/01/2014  . Penicillins Other (See Comments) 08/01/2014  . Nickel Rash 09/16/2014    ROS:  General: Negative for anorexia, weight loss, fever, chills, fatigue, weakness. ENT: Negative for hoarseness, difficulty swallowing , nasal congestion. CV: Negative for chest pain, angina, palpitations, dyspnea on exertion, peripheral edema.  Respiratory: Negative for dyspnea at rest, dyspnea on exertion, cough, sputum, wheezing.  GI: See  history of present illness. GU:  Negative for dysuria, hematuria, urinary incontinence, urinary frequency, nocturnal urination.  Endo: Negative for unusual weight change.    Physical Examination:   BP (!) 146/86   Pulse 74   Temp 98 F (36.7 C) (Oral)   Ht 5\' 3"  (1.6 m)   Wt 157 lb (71.2 kg)   BMI 27.81 kg/m   General: Well-nourished, well-developed in no acute distress.  Eyes: No icterus. Conjunctivae pink. Mouth: Oropharyngeal mucosa moist and pink , no lesions erythema or exudate. Lungs: Clear to auscultation bilaterally. Non-labored. Heart: Regular rate and rhythm, no murmurs rubs or gallops.  Abdomen: Bowel sounds are normal, nontender, nondistended, no hepatosplenomegaly or masses, no abdominal bruits or hernia , no rebound or guarding.   Extremities: No lower extremity edema. No clubbing or deformities. Neuro: Alert and oriented x 3.  Grossly intact. Skin: Warm and dry, no jaundice.   Psych: Alert and cooperative, normal mood and affect.  Labs:    Imaging Studies: No results found.  Assessment and Plan:   [name] is a 70 y.o. y/o female who reports a feeling of food getting stuck in her mid abdomen which has resolved after she moved her bowels. The patient is back to her baseline and has no complaints at the present time. The patient will follow up as needed.    Lucilla Lame, MD. Marval Regal   Note: This dictation was prepared with Dragon dictation along with smaller phrase technology. Any transcriptional errors that result from this  process are unintentional.

## 2016-02-01 ENCOUNTER — Other Ambulatory Visit: Payer: Self-pay

## 2016-02-01 ENCOUNTER — Encounter: Payer: Self-pay | Admitting: *Deleted

## 2016-02-01 NOTE — Discharge Instructions (Signed)

## 2016-02-02 ENCOUNTER — Encounter
Admission: RE | Disposition: A | Discharge status: Home / Self Care | Payer: Self-pay | Source: Ambulatory Visit | Attending: Gastroenterology

## 2016-02-02 ENCOUNTER — Ambulatory Visit: Payer: Medicare Other | Admitting: Anesthesiology

## 2016-02-02 ENCOUNTER — Ambulatory Visit
Admission: RE | Admit: 2016-02-02 | Discharge: 2016-02-02 | Disposition: A | Discharge status: Home / Self Care | Payer: Medicare Other | Source: Ambulatory Visit | Attending: Gastroenterology | Admitting: Gastroenterology

## 2016-02-02 DIAGNOSIS — K219 Gastro-esophageal reflux disease without esophagitis: Secondary | ICD-10-CM | POA: Diagnosis not present

## 2016-02-02 DIAGNOSIS — Z818 Family history of other mental and behavioral disorders: Secondary | ICD-10-CM | POA: Insufficient documentation

## 2016-02-02 DIAGNOSIS — I10 Essential (primary) hypertension: Secondary | ICD-10-CM | POA: Diagnosis not present

## 2016-02-02 DIAGNOSIS — Z801 Family history of malignant neoplasm of trachea, bronchus and lung: Secondary | ICD-10-CM | POA: Insufficient documentation

## 2016-02-02 DIAGNOSIS — Z8249 Family history of ischemic heart disease and other diseases of the circulatory system: Secondary | ICD-10-CM | POA: Insufficient documentation

## 2016-02-02 DIAGNOSIS — R1310 Dysphagia, unspecified: Secondary | ICD-10-CM | POA: Insufficient documentation

## 2016-02-02 DIAGNOSIS — Z820 Family history of epilepsy and other diseases of the nervous system: Secondary | ICD-10-CM | POA: Diagnosis not present

## 2016-02-02 DIAGNOSIS — Z91048 Other nonmedicinal substance allergy status: Secondary | ICD-10-CM | POA: Insufficient documentation

## 2016-02-02 DIAGNOSIS — G709 Myoneural disorder, unspecified: Secondary | ICD-10-CM | POA: Diagnosis not present

## 2016-02-02 DIAGNOSIS — Z8673 Personal history of transient ischemic attack (TIA), and cerebral infarction without residual deficits: Secondary | ICD-10-CM | POA: Diagnosis not present

## 2016-02-02 DIAGNOSIS — Z9851 Tubal ligation status: Secondary | ICD-10-CM | POA: Diagnosis not present

## 2016-02-02 DIAGNOSIS — F419 Anxiety disorder, unspecified: Secondary | ICD-10-CM | POA: Diagnosis not present

## 2016-02-02 DIAGNOSIS — Z833 Family history of diabetes mellitus: Secondary | ICD-10-CM | POA: Insufficient documentation

## 2016-02-02 DIAGNOSIS — Z86010 Personal history of colonic polyps: Secondary | ICD-10-CM | POA: Diagnosis not present

## 2016-02-02 DIAGNOSIS — Z881 Allergy status to other antibiotic agents status: Secondary | ICD-10-CM | POA: Diagnosis not present

## 2016-02-02 DIAGNOSIS — Z79899 Other long term (current) drug therapy: Secondary | ICD-10-CM | POA: Insufficient documentation

## 2016-02-02 DIAGNOSIS — M479 Spondylosis, unspecified: Secondary | ICD-10-CM | POA: Insufficient documentation

## 2016-02-02 DIAGNOSIS — Z880 Allergy status to penicillin: Secondary | ICD-10-CM | POA: Insufficient documentation

## 2016-02-02 DIAGNOSIS — K222 Esophageal obstruction: Secondary | ICD-10-CM | POA: Diagnosis not present

## 2016-02-02 DIAGNOSIS — Z7982 Long term (current) use of aspirin: Secondary | ICD-10-CM | POA: Insufficient documentation

## 2016-02-02 DIAGNOSIS — E785 Hyperlipidemia, unspecified: Secondary | ICD-10-CM | POA: Diagnosis not present

## 2016-02-02 DIAGNOSIS — E559 Vitamin D deficiency, unspecified: Secondary | ICD-10-CM | POA: Insufficient documentation

## 2016-02-02 DIAGNOSIS — R1013 Epigastric pain: Secondary | ICD-10-CM | POA: Diagnosis not present

## 2016-02-02 HISTORY — PX: ESOPHAGOGASTRODUODENOSCOPY (EGD) WITH PROPOFOL: SHX5813

## 2016-02-02 HISTORY — PX: ESOPHAGEAL DILATION: SHX303

## 2016-02-02 SURGERY — ESOPHAGOGASTRODUODENOSCOPY (EGD) WITH PROPOFOL
Anesthesia: Monitor Anesthesia Care

## 2016-02-02 MED ORDER — LACTATED RINGERS IV SOLN
INTRAVENOUS | Status: DC
  Administered 2016-02-02: 08:00:00 via INTRAVENOUS

## 2016-02-02 MED ORDER — LIDOCAINE HCL (CARDIAC) 20 MG/ML IV SOLN
INTRAVENOUS | Status: DC | PRN
  Administered 2016-02-02: 50 mg via INTRAVENOUS

## 2016-02-02 MED ORDER — PROPOFOL 10 MG/ML IV BOLUS
INTRAVENOUS | Status: DC | PRN
  Administered 2016-02-02 (×3): 50 mg via INTRAVENOUS

## 2016-02-02 MED ORDER — SODIUM CHLORIDE 0.9 % IV SOLN: INTRAVENOUS | Status: DC

## 2016-02-02 MED ORDER — GLYCOPYRROLATE 0.2 MG/ML IJ SOLN
INTRAMUSCULAR | Status: DC | PRN
  Administered 2016-02-02: 0.2 mg via INTRAVENOUS

## 2016-02-02 SURGICAL SUPPLY — 32 items
BALLN DILATOR 10-12 8 (BALLOONS)
BALLN DILATOR 12-15 8 (BALLOONS)
BALLN DILATOR 15-18 8 (BALLOONS) ×3
BALLN DILATOR CRE 0-12 8 (BALLOONS)
BALLN DILATOR ESOPH 8 10 CRE (MISCELLANEOUS) IMPLANT
BALLOON DILATOR 12-15 8 (BALLOONS) IMPLANT
BALLOON DILATOR 15-18 8 (BALLOONS) IMPLANT
BALLOON DILATOR CRE 0-12 8 (BALLOONS) IMPLANT
BLOCK BITE 60FR ADLT L/F GRN (MISCELLANEOUS) ×3 IMPLANT
CANISTER SUCT 1200ML W/VALVE (MISCELLANEOUS) ×3 IMPLANT
CLIP HMST 235XBRD CATH ROT (MISCELLANEOUS) IMPLANT
CLIP RESOLUTION 360 11X235 (MISCELLANEOUS)
FCP ESCP3.2XJMB 240X2.8X (MISCELLANEOUS)
FORCEPS BIOP RAD 4 LRG CAP 4 (CUTTING FORCEPS) IMPLANT
FORCEPS BIOP RJ4 240 W/NDL (MISCELLANEOUS)
FORCEPS ESCP3.2XJMB 240X2.8X (MISCELLANEOUS) IMPLANT
GOWN CVR UNV OPN BCK APRN NK (MISCELLANEOUS) ×2 IMPLANT
GOWN ISOL THUMB LOOP REG UNIV (MISCELLANEOUS) ×6
INJECTOR VARIJECT VIN23 (MISCELLANEOUS) IMPLANT
KIT DEFENDO VALVE AND CONN (KITS) IMPLANT
KIT ENDO PROCEDURE OLY (KITS) ×3 IMPLANT
MARKER SPOT ENDO TATTOO 5ML (MISCELLANEOUS) IMPLANT
PAD GROUND ADULT SPLIT (MISCELLANEOUS) IMPLANT
RETRIEVER NET PLAT FOOD (MISCELLANEOUS) IMPLANT
SNARE SHORT THROW 13M SML OVAL (MISCELLANEOUS) IMPLANT
SNARE SHORT THROW 30M LRG OVAL (MISCELLANEOUS) IMPLANT
SPOT EX ENDOSCOPIC TATTOO (MISCELLANEOUS)
SYR INFLATION 60ML (SYRINGE) ×2 IMPLANT
TRAP ETRAP POLY (MISCELLANEOUS) IMPLANT
VARIJECT INJECTOR VIN23 (MISCELLANEOUS)
WATER STERILE IRR 250ML POUR (IV SOLUTION) ×3 IMPLANT
WIRE CRE 18-20MM 8CM F G (MISCELLANEOUS) IMPLANT

## 2016-02-02 NOTE — Anesthesia Procedure Notes (Signed)
Procedure Name: MAC Performed by: Denean Pavon Pre-anesthesia Checklist: Patient identified, Emergency Drugs available, Suction available, Timeout performed and Patient being monitored Patient Re-evaluated:Patient Re-evaluated prior to inductionOxygen Delivery Method: Nasal cannula Placement Confirmation: positive ETCO2     

## 2016-02-02 NOTE — Anesthesia Postprocedure Evaluation (Signed)
Anesthesia Post Note  Patient: [name]  Procedure(s) Performed: Procedure(s) (LRB): ESOPHAGOGASTRODUODENOSCOPY (EGD) WITH PROPOFOL (N/A) ESOPHAGEAL DILATION (N/A)  Patient location during evaluation: PACU Anesthesia Type: MAC Level of consciousness: awake and alert Pain management: pain level controlled Vital Signs Assessment: post-procedure vital signs reviewed and stable Respiratory status: spontaneous breathing, nonlabored ventilation, respiratory function stable and patient connected to nasal cannula oxygen Cardiovascular status: stable and blood pressure returned to baseline Anesthetic complications: no    Bryce Cheever C

## 2016-02-02 NOTE — Transfer of Care (Signed)
Immediate Anesthesia Transfer of Care Note  Patient: [name]  Procedure(s) Performed: Procedure(s): ESOPHAGOGASTRODUODENOSCOPY (EGD) WITH PROPOFOL (N/A) ESOPHAGEAL DILATION (N/A)  Patient Location: PACU  Anesthesia Type: MAC  Level of Consciousness: awake, alert  and patient cooperative  Airway and Oxygen Therapy: Patient Spontanous Breathing and Patient connected to supplemental oxygen  Post-op Assessment: Post-op Vital signs reviewed, Patient's Cardiovascular Status Stable, Respiratory Function Stable, Patent Airway and No signs of Nausea or vomiting  Post-op Vital Signs: Reviewed and stable  Complications: No apparent anesthesia complications

## 2016-02-02 NOTE — Anesthesia Preprocedure Evaluation (Addendum)
Anesthesia Evaluation    Airway Mallampati: II  TM Distance: >3 FB Neck ROM: Full    Dental no notable dental hx.    Pulmonary neg pulmonary ROS,    Pulmonary exam normal breath sounds clear to auscultation       Cardiovascular hypertension, negative cardio ROS Normal cardiovascular exam Rhythm:Regular Rate:Normal     Neuro/Psych Anxiety TIA   GI/Hepatic GERD  Medicated and Controlled,  Endo/Other    Renal/GU      Musculoskeletal  (+) Arthritis ,   Abdominal   Peds negative pediatric ROS (+)  Hematology   Anesthesia Other Findings   Reproductive/Obstetrics                             Anesthesia Physical Anesthesia Plan  ASA: II  Anesthesia Plan: MAC   Post-op Pain Management:    Induction: Intravenous  Airway Management Planned:   Additional Equipment:   Intra-op Plan:   Post-operative Plan: Extubation in OR  Informed Consent: I have reviewed the patients History and Physical, chart, labs and discussed the procedure including the risks, benefits and alternatives for the proposed anesthesia with the patient or authorized representative who has indicated his/her understanding and acceptance.   Dental advisory given  Plan Discussed with: CRNA  Anesthesia Plan Comments:        Anesthesia Quick Evaluation

## 2016-02-02 NOTE — Op Note (Signed)
[facility] Gastroenterology Patient Name: [name] Procedure Date: 02/02/2016 8:47 AM MRN: [id number] Account #: [id number] Date of Birth: [birth year] Admit Type: Outpatient Age: 70 Room: [facility] OR ROOM 01 Gender: Female Note Status: Finalized Procedure:            Upper GI endoscopy Indications:          Dysphagia Providers:            Lucilla Lame MD, MD Referring MD:         [name]. [name], MD (Referring MD) Medicines:            Propofol per Anesthesia Complications:        No immediate complications. Procedure:            Pre-Anesthesia Assessment:                       - Prior to the procedure, a History and Physical was                        performed, and patient medications and allergies were                        reviewed. The patient's tolerance of previous                        anesthesia was also reviewed. The risks and benefits of                        the procedure and the sedation options and risks were                        discussed with the patient. All questions were                        answered, and informed consent was obtained. Prior                        Anticoagulants: The patient has taken no previous                        anticoagulant or antiplatelet agents. ASA Grade                        Assessment: II - A patient with mild systemic disease.                        After reviewing the risks and benefits, the patient was                        deemed in satisfactory condition to undergo the                        procedure.                       After obtaining informed consent, the endoscope was                        passed under direct vision. Throughout the procedure,  the patient's blood pressure, pulse, and oxygen                        saturations were monitored continuously. The Olympus                        GIF H180J colonscope [id number]) was introduced                        through the  mouth, and advanced to the second part of                        duodenum. The upper GI endoscopy was accomplished                        without difficulty. The patient tolerated the procedure                        well. Findings:      One mild benign-appearing, intrinsic stenosis was found in the upper       third of the esophagus. And was traversed. A TTS dilator was passed       through the scope. Dilation with a 12-13.5-15 mm balloon dilator was       performed to 15 mm. The dilation site was examined following endoscope       reinsertion and showed complete resolution of luminal narrowing.      The stomach was normal.      The examined duodenum was normal. Impression:           - Benign-appearing esophageal stenosis. Dilated.                       - Normal stomach.                       - Normal examined duodenum.                       - No specimens collected. Recommendation:       - Discharge patient to home.                       - Resume previous diet.                       - Continue present medications. Procedure Code(s):    --- Professional ---                       [phone], Esophagogastroduodenoscopy, flexible, transoral;                        with transendoscopic balloon dilation of esophagus                        (less than 30 mm diameter) Diagnosis Code(s):    --- Professional ---                       R13.10, Dysphagia, unspecified                       K22.2, Esophageal obstruction CPT copyright 2016 American Medical Association. All rights reserved. The  codes documented in this report are preliminary and upon coder review may  be revised to meet current compliance requirements. Lucilla Lame MD, MD 02/02/2016 8:56:03 AM This report has been signed electronically. Number of Addenda: 0 Note Initiated On: 02/02/2016 8:47 AM Total Procedure Duration: 0 hours 4 minutes 40 seconds       [facility]

## 2016-02-02 NOTE — H&P (Signed)
Lucilla Lame, MD [facility] [address] [place], [place] [address] Phone: [phone] Fax : [phone]  Primary Care Physician:  Lelon Huh, MD Primary Gastroenterologist:  Dr. Allen Norris  Pre-Procedure History & Physical: HPI:  OAKLI ZECH is a 70 y.o. female is here for an endoscopy.   Past Medical History:  Diagnosis Date  . Anxiety   . Arthritis    lower spine  . Back pain    lower back - S/P lifting injury  . Complication of anesthesia    makes hair brittle  . GERD (gastroesophageal reflux disease)   . Hyperlipidemia   . Hypertension   . Neuromuscular disorder (HCC)    numbness legs and feet s/p lower back injury  . Stroke [facility])    "mini - strokes" 2009 - no deficit  . Vertigo    none recently  . Vitamin D deficiency   . Wears contact lenses     Past Surgical History:  Procedure Laterality Date  . ABDOMINAL HYSTERECTOMY  1987  . CARDIAC CATHETERIZATION  02/2007  . COLON SURGERY  03/12/2012  . COLONOSCOPY WITH PROPOFOL N/A 09/26/2014   Procedure: COLONOSCOPY WITH PROPOFOL;  Surgeon: Lucilla Lame, MD;  Location: [place];  Service: Endoscopy;  Laterality: N/A;  marker (tattoo) used in colon  . POLYPECTOMY  09/26/2014   Procedure: POLYPECTOMY;  Surgeon: Lucilla Lame, MD;  Location: [place];  Service: Endoscopy;;  . TUBAL LIGATION  1972    Prior to Admission medications   Medication Sig Start Date End Date Taking? Authorizing Provider  ALPRAZolam Duanne Moron) 0.5 MG tablet Take 1 tablet (0.5 mg total) by mouth 3 (three) times daily as needed for anxiety. 08/29/15  Yes Carmon Ginsberg, PA  aspirin 81 MG tablet Take by mouth.   Yes Historical Provider, MD  omeprazole (PRILOSEC) 20 MG capsule Take 20 mg by mouth daily as needed.   Yes Historical Provider, MD  Probiotic CAPS Take by mouth.   Yes Historical Provider, MD  lidocaine (XYLOCAINE) 2 % jelly  06/06/15   Historical Provider, MD    Allergies as of 02/01/2016 - Review Complete 02/01/2016    Allergen Reaction Noted  . Amoxicillin  08/01/2014  . Calcium channel blockers  08/01/2014  . Penicillins Other (See Comments) 08/01/2014  . Nickel Rash 09/16/2014    Family History  Problem Relation Age of Onset  . Diabetes Mother   . Heart disease Mother   . Hypertension Mother   . Mental illness Mother   . Cancer Father     lung cancer  . Drug abuse Brother   . Multiple sclerosis Brother     Social History   Social History  . Marital status: Married    Spouse name: N/A  . Number of children: N/A  . Years of education: N/A   Occupational History  . Not on file.   Social History Main Topics  . Smoking status: Never Smoker  . Smokeless tobacco: Never Used  . Alcohol use No  . Drug use: No  . Sexual activity: Not on file   Other Topics Concern  . Not on file   Social History Narrative  . No narrative on file    Review of Systems: See HPI, otherwise negative ROS  Physical Exam: BP 137/81   Pulse 85   Temp 97.7 F (36.5 C) (Temporal)   Resp 16   Ht 5\' 3"  (1.6 m)   Wt 154 lb (69.9 kg)   SpO2 99%  BMI 27.28 kg/m  General:   Alert,  pleasant and cooperative in NAD Head:  Normocephalic and atraumatic. Neck:  Supple; no masses or thyromegaly. Lungs:  Clear throughout to auscultation.    Heart:  Regular rate and rhythm. Abdomen:  Soft, nontender and nondistended. Normal bowel sounds, without guarding, and without rebound.   Neurologic:  Alert and  oriented x4;  grossly normal neurologically.  Impression/Plan: [name] is here for an endoscopy to be performed for dysphagia  Risks, benefits, limitations, and alternatives regarding  endoscopy have been reviewed with the patient.  Questions have been answered.  All parties agreeable.   Lucilla Lame, MD  02/02/2016, 8:15 AM

## 2016-02-05 ENCOUNTER — Encounter: Payer: Self-pay | Admitting: Gastroenterology

## 2016-02-06 ENCOUNTER — Telehealth: Payer: Self-pay | Admitting: Gastroenterology

## 2016-02-06 ENCOUNTER — Telehealth: Payer: Self-pay | Admitting: Physician Assistant

## 2016-02-06 DIAGNOSIS — Z8249 Family history of ischemic heart disease and other diseases of the circulatory system: Secondary | ICD-10-CM | POA: Diagnosis not present

## 2016-02-06 DIAGNOSIS — R200 Anesthesia of skin: Secondary | ICD-10-CM | POA: Diagnosis not present

## 2016-02-06 DIAGNOSIS — R5383 Other fatigue: Secondary | ICD-10-CM | POA: Diagnosis not present

## 2016-02-06 DIAGNOSIS — R42 Dizziness and giddiness: Secondary | ICD-10-CM | POA: Diagnosis not present

## 2016-02-06 DIAGNOSIS — Z7982 Long term (current) use of aspirin: Secondary | ICD-10-CM | POA: Diagnosis not present

## 2016-02-06 DIAGNOSIS — Z880 Allergy status to penicillin: Secondary | ICD-10-CM | POA: Diagnosis not present

## 2016-02-06 DIAGNOSIS — Z79899 Other long term (current) drug therapy: Secondary | ICD-10-CM | POA: Diagnosis not present

## 2016-02-06 DIAGNOSIS — R0602 Shortness of breath: Secondary | ICD-10-CM | POA: Diagnosis not present

## 2016-02-06 DIAGNOSIS — G8929 Other chronic pain: Secondary | ICD-10-CM | POA: Diagnosis not present

## 2016-02-06 DIAGNOSIS — R079 Chest pain, unspecified: Secondary | ICD-10-CM | POA: Diagnosis not present

## 2016-02-06 DIAGNOSIS — R002 Palpitations: Secondary | ICD-10-CM | POA: Diagnosis not present

## 2016-02-06 DIAGNOSIS — R062 Wheezing: Secondary | ICD-10-CM | POA: Diagnosis not present

## 2016-02-06 DIAGNOSIS — R1030 Lower abdominal pain, unspecified: Secondary | ICD-10-CM | POA: Diagnosis not present

## 2016-02-06 DIAGNOSIS — M549 Dorsalgia, unspecified: Secondary | ICD-10-CM | POA: Diagnosis not present

## 2016-02-06 DIAGNOSIS — R0600 Dyspnea, unspecified: Secondary | ICD-10-CM | POA: Diagnosis not present

## 2016-02-06 DIAGNOSIS — K219 Gastro-esophageal reflux disease without esophagitis: Secondary | ICD-10-CM | POA: Diagnosis not present

## 2016-02-06 NOTE — Telephone Encounter (Signed)
Patient reports that her main concern is abdominal pain especially after eating. Patient denies vomiting, diarrhea. Patient describes pain as cramping, painful, hot and flush. Patient reports that stools are very small and denies blood or mucus. Patient reports that she has been having weakness in her legs but she has been dealing with this for a while and wants  further evaluation for abdominal pain. I advised pt to call Dr. Dorothey Baseman office and let them know that she is still having symptoms even after colonoscopy and endoscopy were normal.

## 2016-02-06 NOTE — Telephone Encounter (Signed)
Symptoms: feels like food just sitting in her stomach. When it passes sometimes she will pass out, gets hot and feels like she will pass out. Her stools are small. The past few days she is weak. She thinks maybe it's her diet. She went on a salmon diet. She did she her PCP and they told her to contact us. Please call

## 2016-02-06 NOTE — Telephone Encounter (Signed)
Did see patient in office a couple of weeks ago. Her labs were normal, no cardiac symptoms. She did have a lot of back pain and does have imaging findings of bulging disc. Spoke with Dr. [name], and we can try prednisone taper for back since this may have progressed and has not been imaged. Also we can put in a referral for [facility] orthopedics, however the wait will likely be several weeks to several months. We can also refer locally.

## 2016-02-06 NOTE — Telephone Encounter (Signed)
Returned pt's call regarding symptoms. Pt stated she got sick and had to call the EMS. She is currently at [facility] her to make sure they send Dr. Allen Norris notes and contact me when she is released.

## 2016-02-06 NOTE — Telephone Encounter (Signed)
Pt stated she saw [name] on 01/24/16 and she still isn't feeling better. Pt stated she is fatigued and her legs are weak and having a difficult time walking because of this. Pt is requesting a referral or orders to be sent to [facility] to undergo more testing because she knows something is wrong and causing this. Please advise. Thanks TNP

## 2016-02-07 DIAGNOSIS — R5383 Other fatigue: Secondary | ICD-10-CM | POA: Diagnosis not present

## 2016-02-07 DIAGNOSIS — R0789 Other chest pain: Secondary | ICD-10-CM | POA: Diagnosis not present

## 2016-02-07 DIAGNOSIS — R0602 Shortness of breath: Secondary | ICD-10-CM | POA: Diagnosis not present

## 2016-02-07 DIAGNOSIS — R0600 Dyspnea, unspecified: Secondary | ICD-10-CM | POA: Diagnosis not present

## 2016-02-07 DIAGNOSIS — R002 Palpitations: Secondary | ICD-10-CM | POA: Diagnosis not present

## 2016-02-07 DIAGNOSIS — R0609 Other forms of dyspnea: Secondary | ICD-10-CM | POA: Diagnosis not present

## 2016-02-21 ENCOUNTER — Ambulatory Visit (INDEPENDENT_AMBULATORY_CARE_PROVIDER_SITE_OTHER): Payer: Medicare Other | Admitting: Family Medicine

## 2016-02-21 ENCOUNTER — Encounter: Payer: Self-pay | Admitting: Family Medicine

## 2016-02-21 VITALS — BP 120/80 | HR 76 | Temp 98.2°F | Resp 16 | Wt 160.0 lb

## 2016-02-21 DIAGNOSIS — F410 Panic disorder [episodic paroxysmal anxiety] without agoraphobia: Secondary | ICD-10-CM | POA: Diagnosis not present

## 2016-02-21 DIAGNOSIS — R29898 Other symptoms and signs involving the musculoskeletal system: Secondary | ICD-10-CM | POA: Insufficient documentation

## 2016-02-21 DIAGNOSIS — E559 Vitamin D deficiency, unspecified: Secondary | ICD-10-CM

## 2016-02-21 DIAGNOSIS — F32A Depression, unspecified: Secondary | ICD-10-CM | POA: Insufficient documentation

## 2016-02-21 DIAGNOSIS — F329 Major depressive disorder, single episode, unspecified: Secondary | ICD-10-CM

## 2016-02-21 MED ORDER — CITALOPRAM HYDROBROMIDE 10 MG PO TABS: ORAL_TABLET | ORAL | 1 refills | Status: DC

## 2016-02-21 NOTE — Progress Notes (Signed)
Patient: [name] Female    DOB: Jul 19, 1945   71 y.o.   MRN: [id number] Visit Date: 02/21/2016  Today's Provider: Lelon Huh, MD   Chief Complaint  Patient presents with  . Hospitalization Follow-up    Chest pressure   Subjective:    HPI  Follow up Hospitalization  Patient was admitted to [facility]  on 02/06/2016 and discharged on 02/07/2016. She was treated for Chest pressure and lower abdominal pain. She had normal EKG, serial troponins and CK.  Lower abdominal pain was thought likely related to IBS and patient was advised to follow up with GI as outpatient.  She reports good compliance with treatment. She reports this condition is resolved. Patient states she was told that the pain and pressure was related to Panic attacks.  She is currently taking alprazolam twice a day for anxiety/panic attacks.   She states she has much more stress lately and may be depressed. Her brother died in 2023-03-15 and her son has had complications from Reynaud's syndrome over the last six months. He has apparently had several fingers amputated and required long term home IV antibiotics which [name] was helping to care for. She states that when this started back in the summer she started having trouble with weakness in her legs after taking long walks. She would end up in bed for a days at a time due to weakness in her legs. She has stopped taking her regular walks, but now her legs feels weak just from  Normal daily activities. States she often stays in bed most of the day. Feels very fatigued with poor energy. She feels she is depressed. She was prescribed paxil, prozac and sertraline in the past but states she didn't tolerate them.   Lab Results  Component Value Date   WBC 6.2 10/30/2015   HGB 14.9 03/07/2015   HCT 44.4 10/30/2015   MCV 89 10/30/2015   PLT 278 10/30/2015   CMP Latest Ref Rng & Units 10/30/2015 03/07/2015 10/20/2014  Glucose 65 - 99 mg/dL 87 116(H) 85  BUN 8 - 27 mg/dL 15 12  15   Creatinine 0.57 - 1.00 mg/dL 0.78 0.75 0.80  Sodium 134 - 144 mmol/L 142 140 139  Potassium 3.5 - 5.2 mmol/L 3.5 3.7 4.3  Chloride 96 - 106 mmol/L 102 107 101  CO2 18 - 29 mmol/L 24 25 25   Calcium 8.7 - 10.3 mg/dL 9.3 9.2 9.3  Total Protein 6.0 - 8.5 g/dL 7.2 - -  Total Bilirubin 0.0 - 1.2 mg/dL 0.9 - -  Alkaline Phos 39 - 117 IU/L 61 - -  AST 0 - 40 IU/L 15 - -  ALT 0 - 32 IU/L 12 - -   Lab Results  Component Value Date   VD25OH 31.0 01/24/2016   Lab Results  Component Value Date   TSH 0.822 01/24/2016    ------------------------------------------------------------------------------------       Allergies  Allergen Reactions  . Amoxicillin     Yeast infections  . Calcium Channel Blockers     Other reaction(s): Dizziness  . Penicillins Other (See Comments)    Yeast infection  . Nickel Rash     Current Outpatient Prescriptions:  .  ALPRAZolam (XANAX) 0.5 MG tablet, Take 1 tablet (0.5 mg total) by mouth 3 (three) times daily as needed for anxiety., Disp: 90 tablet, Rfl: 5 .  aspirin 81 MG tablet, Take by mouth., Disp: , Rfl:  .  lidocaine (XYLOCAINE) 2 %  jelly, as needed. , Disp: , Rfl:  .  omeprazole (PRILOSEC) 20 MG capsule, Take 20 mg by mouth daily as needed., Disp: , Rfl:  .  Probiotic CAPS, Take by mouth., Disp: , Rfl:   Review of Systems  Constitutional: Negative for appetite change, chills, fatigue and fever.  Respiratory: Negative for chest tightness and shortness of breath.   Cardiovascular: Negative for chest pain and palpitations.  Gastrointestinal: Negative for abdominal pain, nausea and vomiting.  Neurological: Negative for dizziness and weakness.  Psychiatric/Behavioral: Positive for dysphoric mood and sleep disturbance. The patient is nervous/anxious.     Social History  Substance Use Topics  . Smoking status: Never Smoker  . Smokeless tobacco: Never Used  . Alcohol use No   Objective:   BP 120/80 (BP Location: Left Arm, Patient  Position: Sitting, Cuff Size: Large)   Pulse 76   Temp 98.2 F (36.8 C) (Oral)   Resp 16   Wt 160 lb (72.6 kg)   SpO2 97% Comment: room air  BMI 28.34 kg/m   Physical Exam   General Appearance:    Alert, cooperative, no distress  Eyes:    PERRL, conjunctiva/corneas clear, EOM's intact       Lungs:     Clear to auscultation bilaterally, respirations unlabored  Heart:    Regular rate and rhythm  Neurologic:   Awake, alert, oriented x 3. No apparent focal neurological           defect.   Psych:   Flat affect. Depressed.        Assessment & Plan:     1. Panic disorder Tried a few SSRIs in the past which she didn't tolerate. Will try low dose of citalopram. She is to call if she has any adverse effects.  - citalopram (CELEXA) 10 MG tablet; Take one tablet daily for 2 weeks then increase 2 tablets daily if tolerated  Dispense: 30 tablet; Refill: 1  2. Weakness of both lower extremities Has had extensive and normal labs over the last 6 months. This is likely secondary to depression.   3. Vitamin D deficiency Corrected  4. Depression, unspecified depression type  - citalopram (CELEXA) 10 MG tablet; Take one tablet daily for 2 weeks then increase 2 tablets daily if tolerated  Dispense: 30 tablet; Refill: 1  Return in about 3 weeks (around 03/13/2016).   The entirety of the information documented in the History of Present Illness, Review of Systems and Physical Exam were personally obtained by me. Portions of this information were initially documented by Meyer Cory, CMA and reviewed by me for thoroughness and accuracy.        Lelon Huh, MD  [facility]

## 2016-03-13 ENCOUNTER — Ambulatory Visit: Payer: Self-pay | Admitting: Family Medicine

## 2016-03-21 ENCOUNTER — Other Ambulatory Visit: Payer: Self-pay | Admitting: Family Medicine

## 2016-03-21 DIAGNOSIS — F419 Anxiety disorder, unspecified: Secondary | ICD-10-CM

## 2016-03-25 ENCOUNTER — Other Ambulatory Visit: Payer: Self-pay | Admitting: *Deleted

## 2016-03-25 ENCOUNTER — Telehealth: Payer: Self-pay | Admitting: Family Medicine

## 2016-03-25 ENCOUNTER — Other Ambulatory Visit: Payer: Self-pay | Admitting: Family Medicine

## 2016-03-25 DIAGNOSIS — F32A Depression, unspecified: Secondary | ICD-10-CM

## 2016-03-25 DIAGNOSIS — F410 Panic disorder [episodic paroxysmal anxiety] without agoraphobia: Secondary | ICD-10-CM

## 2016-03-25 DIAGNOSIS — F329 Major depressive disorder, single episode, unspecified: Secondary | ICD-10-CM

## 2016-03-25 MED ORDER — CITALOPRAM HYDROBROMIDE 10 MG PO TABS: ORAL_TABLET | ORAL | 4 refills | Status: DC

## 2016-03-25 NOTE — Telephone Encounter (Signed)
Called in. [name], CMA  

## 2016-03-25 NOTE — Telephone Encounter (Signed)
Requesting 90 day supply.

## 2016-03-26 ENCOUNTER — Encounter: Payer: Self-pay | Admitting: Family Medicine

## 2016-03-26 ENCOUNTER — Ambulatory Visit (INDEPENDENT_AMBULATORY_CARE_PROVIDER_SITE_OTHER): Payer: Medicare Other | Admitting: Family Medicine

## 2016-03-26 VITALS — BP 124/88 | HR 108 | Temp 98.8°F | Resp 16 | Wt 157.0 lb

## 2016-03-26 DIAGNOSIS — R059 Cough, unspecified: Secondary | ICD-10-CM

## 2016-03-26 DIAGNOSIS — J40 Bronchitis, not specified as acute or chronic: Secondary | ICD-10-CM | POA: Diagnosis not present

## 2016-03-26 DIAGNOSIS — R05 Cough: Secondary | ICD-10-CM

## 2016-03-26 LAB — POCT INFLUENZA A/B
Influenza A, POC: NEGATIVE
Influenza B, POC: NEGATIVE

## 2016-03-26 MED ORDER — FLUCONAZOLE 150 MG PO TABS: 150.0000 mg | ORAL_TABLET | Freq: Once | ORAL | 0 refills | Status: AC

## 2016-03-26 MED ORDER — AZITHROMYCIN 250 MG PO TABS: ORAL_TABLET | ORAL | 0 refills | Status: AC

## 2016-03-26 MED ORDER — GUAIFENESIN-CODEINE 100-10 MG/5ML PO SOLN: 10.0000 mL | Freq: Four times a day (QID) | ORAL | 0 refills | Status: DC | PRN

## 2016-03-26 NOTE — Progress Notes (Signed)
Patient: [name] Female    DOB: January 19, 1946   71 y.o.   MRN: [id number] Visit Date: 03/26/2016  Today's Provider: Lelon Huh, MD   Chief Complaint  Patient presents with  . Cough   Subjective:    Patient was had a cough and congestion for 2-3 days. Cough is constant and slightly productive. Patient states cough is most dry and is worse when she lays down. Patient has been taking otc theraflu, and some other otc cough medications with only mild relief. Other symptoms include headaches, sore throat, chest congestion and nasal congestion.   Cough  This is a new problem. The current episode started in the past 7 days (2-3 days ago). The problem has been gradually worsening. The problem occurs constantly. The cough is productive of sputum. Associated symptoms include chills, a fever, headaches, nasal congestion, postnasal drip, rhinorrhea, a sore throat and sweats. Pertinent negatives include no chest pain, ear congestion, ear pain, heartburn, hemoptysis, myalgias, rash, shortness of breath, weight loss or wheezing. The symptoms are aggravated by lying down. Treatments tried: otc theraflu, and some some other otc cough medications. The treatment provided mild relief. Her past medical history is significant for bronchitis. There is no history of asthma, bronchiectasis, COPD, emphysema, environmental allergies or pneumonia.      Allergies  Allergen Reactions  . Amoxicillin     Yeast infections  . Calcium Channel Blockers     Other reaction(s): Dizziness  . Penicillins Other (See Comments)    Yeast infection  . Nickel Rash     Current Outpatient Prescriptions:  .  ALPRAZolam (XANAX) 0.5 MG tablet, TAKE ONE TABLET THREE TIMES A DAY AS NEEDED FOR ANXIETY, Disp: 90 tablet, Rfl: 5 .  aspirin 81 MG tablet, Take by mouth., Disp: , Rfl:  .  citalopram (CELEXA) 10 MG tablet, Take one tablet daily for 2 weeks then increase 2 tablets daily if tolerated, Disp: 90 tablet, Rfl: 4 .   lidocaine (XYLOCAINE) 2 % jelly, as needed. , Disp: , Rfl:  .  omeprazole (PRILOSEC) 20 MG capsule, Take 20 mg by mouth daily as needed., Disp: , Rfl:  .  Probiotic CAPS, Take by mouth., Disp: , Rfl:   Review of Systems  Constitutional: Positive for chills and fever. Negative for appetite change, fatigue and weight loss.  HENT: Positive for congestion, postnasal drip, rhinorrhea, sore throat, trouble swallowing and voice change. Negative for ear pain.   Respiratory: Positive for cough. Negative for hemoptysis, chest tightness, shortness of breath and wheezing.   Cardiovascular: Negative for chest pain and palpitations.  Gastrointestinal: Negative for abdominal pain, heartburn, nausea and vomiting.  Musculoskeletal: Negative for myalgias.  Skin: Negative for rash.  Allergic/Immunologic: Negative for environmental allergies.  Neurological: Positive for headaches. Negative for dizziness and weakness.    Social History  Substance Use Topics  . Smoking status: Never Smoker  . Smokeless tobacco: Never Used  . Alcohol use No   Objective:   BP 124/88 (BP Location: Left Arm, Patient Position: Sitting, Cuff Size: Normal)   Pulse (!) 108   Temp 98.8 F (37.1 C) (Oral)   Resp 16   Wt 157 lb (71.2 kg)   SpO2 99%   BMI 27.81 kg/m   Physical Exam  General Appearance:    Alert, cooperative, no distress  HENT:   bilateral TM normal without fluid or infection, neck without nodes, pharynx erythematous without exudate, sinuses nontender and nasal mucosa pale and congested  Eyes:    PERRL, conjunctiva/corneas clear, EOM's intact       Lungs:     Occasional expiratory wheeze, no rales,  respirations unlabored  Heart:    Regular rate and rhythm  Neurologic:   Awake, alert, oriented x 3. No apparent focal neurological           defect.       Results for orders placed or performed in visit on 03/26/16  POCT Influenza A/B  Result Value Ref Range   Influenza A, POC Negative Negative   Influenza  B, POC Negative Negative       Assessment & Plan:     1. Cough  - POCT Influenza A/B - guaiFENesin-codeine 100-10 MG/5ML syrup; Take 10 mLs by mouth every 6 (six) hours as needed for cough.  Dispense: 240 mL; Refill: 0  2. Bronchitis  - azithromycin (ZITHROMAX) 250 MG tablet; 2 by mouth today, then 1 daily for 4 days  Dispense: 6 tablet; Refill: 0 - fluconazole (DIFLUCAN) 150 MG tablet; Take 1 tablet (150 mg total) by mouth once.  Dispense: 1 tablet; Refill: 0  Call if symptoms change or if not rapidly improving.         The entirety of the information documented in the History of Present Illness, Review of Systems and Physical Exam were personally obtained by me. Portions of this information were initially documented by April M. Sabra Heck, CMA and reviewed by me for thoroughness and accuracy.    Lelon Huh, MD  [facility]

## 2016-04-08 NOTE — Telephone Encounter (Signed)
Opened in error

## 2016-04-20 ENCOUNTER — Other Ambulatory Visit: Payer: Self-pay | Admitting: Family Medicine

## 2016-04-20 DIAGNOSIS — F329 Major depressive disorder, single episode, unspecified: Secondary | ICD-10-CM

## 2016-04-20 DIAGNOSIS — F410 Panic disorder [episodic paroxysmal anxiety] without agoraphobia: Secondary | ICD-10-CM

## 2016-04-20 DIAGNOSIS — F32A Depression, unspecified: Secondary | ICD-10-CM

## 2016-05-01 ENCOUNTER — Telehealth: Payer: Self-pay | Admitting: Family Medicine

## 2016-05-01 NOTE — Telephone Encounter (Signed)
Called Pt to schedule AWV with [facility] - knb °

## 2016-06-04 ENCOUNTER — Telehealth: Payer: Self-pay | Admitting: Family Medicine

## 2016-06-04 DIAGNOSIS — G8929 Other chronic pain: Secondary | ICD-10-CM

## 2016-06-04 DIAGNOSIS — M545 Low back pain: Principal | ICD-10-CM

## 2016-06-04 MED ORDER — HYDROCODONE-ACETAMINOPHEN 5-325 MG PO TABS: 1.0000 | ORAL_TABLET | Freq: Three times a day (TID) | ORAL | 0 refills | Status: DC | PRN

## 2016-06-04 NOTE — Telephone Encounter (Signed)
Pt needs refill on her hydrocodone.  I did not see it listed.  Thanks, [employer]

## 2016-06-04 NOTE — Telephone Encounter (Signed)
Please review

## 2016-08-06 ENCOUNTER — Other Ambulatory Visit: Payer: Self-pay | Admitting: Family Medicine

## 2016-08-06 NOTE — Telephone Encounter (Signed)
Can try [facility]. Please clarify if she [place] [facility] [place] and sent prescription to pharmacy of her choice.  O.V if she has any eye PAIN, or if not improving in a few days.

## 2016-08-06 NOTE — Telephone Encounter (Signed)
Please review

## 2016-08-06 NOTE — Telephone Encounter (Signed)
Pt needs refill on allergy eye drops that has been prescribed for her before for allergies.  Pt says her eyes are red, swollen and itchy.  She uses [facility]

## 2016-08-09 NOTE — Telephone Encounter (Signed)
Called patient and she states she feels better and no longer needs any eye drops.

## 2016-08-29 ENCOUNTER — Encounter: Payer: Self-pay | Admitting: Physician Assistant

## 2016-08-29 ENCOUNTER — Ambulatory Visit (INDEPENDENT_AMBULATORY_CARE_PROVIDER_SITE_OTHER): Payer: Medicare Other | Admitting: Physician Assistant

## 2016-08-29 VITALS — BP 124/78 | HR 76 | Temp 98.0°F | Resp 16 | Wt 157.0 lb

## 2016-08-29 DIAGNOSIS — F329 Major depressive disorder, single episode, unspecified: Secondary | ICD-10-CM | POA: Diagnosis not present

## 2016-08-29 DIAGNOSIS — F4320 Adjustment disorder, unspecified: Secondary | ICD-10-CM | POA: Diagnosis not present

## 2016-08-29 DIAGNOSIS — F419 Anxiety disorder, unspecified: Secondary | ICD-10-CM | POA: Diagnosis not present

## 2016-08-29 DIAGNOSIS — R202 Paresthesia of skin: Secondary | ICD-10-CM | POA: Diagnosis not present

## 2016-08-29 DIAGNOSIS — R110 Nausea: Secondary | ICD-10-CM | POA: Diagnosis not present

## 2016-08-29 DIAGNOSIS — F32A Depression, unspecified: Secondary | ICD-10-CM

## 2016-08-29 DIAGNOSIS — F4321 Adjustment disorder with depressed mood: Secondary | ICD-10-CM

## 2016-08-29 LAB — POCT GLYCOSYLATED HEMOGLOBIN (HGB A1C)
Est. average glucose Bld gHb Est-mCnc: 108
Hemoglobin A1C: 5.4

## 2016-08-29 MED ORDER — ONDANSETRON HCL 4 MG PO TABS: 4.0000 mg | ORAL_TABLET | Freq: Every evening | ORAL | 0 refills | Status: DC | PRN

## 2016-08-29 NOTE — Progress Notes (Signed)
Patient: [name] Female    DOB: September 29, 1945   71 y.o.   MRN: [id number] Visit Date: 08/29/2016  Today's Provider: Trinna Post, PA-C   Chief Complaint  Patient presents with  . Numbness   Subjective:    HPI   [name] is a 71 y/o woman presenting today for numbness and fatigue. She had similar issues in 03-18-2016 when her brother had passed away. At that time, TSH, CBC, Cmet normal. Vitamin D borderline low at 31 and she was instructed to take 800 IU vitamin D. She couldn't find it OTC so she hasn't done this.  She represented in January 2018 to see Dr. [name]. Was put on Celexa 20 mg QD for depression and anxiety. She took this for two days and stopped 2/2 nausea.   Since then, she is still having a difficult time coping with her brother's death. She is also dealing with her son's medical issues as he has had amputations from Reynaud's and secondary infections requiring IV abx. She describes herself as a Research officer, trade union, and she has been worrying about everything. Her son has been drinking increasingly more and she has worried about this. Her son has also had a medical procedure delayed because of surgeon availability and she continues to worry about this.   She also recently had a cousin who shot and killed himself two weeks ago in the park. A close neighbor died recently. She has also had her niece from [place] staying with her for the past month due to death of her sister. This has been added stress on patient.  Patient also reports she is mixed up about her religion right now. Attends [employer] in [place], [place]. She is talking to both her pastor and a friend who is a Engineer, water, both of whom told her she should try to restart her Celexa. Lives with husband who doesn't deal a lot with emotion.   She is reporting extreme fatigue. Having hard time to get out of bed daily. Denies SI/HI. Having hard time doing housework. Walks her dog and is extremely  fatigued afterwards, has to go to bed. Is thinking and worrying a lot about death and what happens if she were to die. She is also reporting numbness in her extremities. Has not been taking vit D. Has been taking Vitamin B12.   Numbness This problem has been occurring for 3 weeks, and has been waxing and waning since onset. The numbness is located in bilateral hands and feet. She also notes pain in bilateral forearms, left more so than right. Pt is also c/o several other symptoms, including anxiety. Dr. [name] started pt on Citalopram, but she can take this medication due to nausea. She is still taking 0.25-0.5 mg of Alprazolam BID, up to TID. Her other sx include fatigue, lightheadedness, decreased interest, sweats, heat intolerance. She has tried taking B12 without relief of sx. She states these sx are similar to when she had a low Vitamin D levels. She denies polydipsia, polyuria.  Allergies  Allergen Reactions  . Calcium Channel Blockers     Other reaction(s): Dizziness  . Amoxicillin Other (See Comments)    Yeast infections; can take with Diflucan  . Nickel Rash  . Penicillins Other (See Comments)    Yeast infection; can take with Diflucan     Current Outpatient Prescriptions:  .  ALPRAZolam (XANAX) 0.5 MG tablet, TAKE ONE TABLET THREE TIMES A DAY AS NEEDED FOR ANXIETY,  Disp: 90 tablet, Rfl: 5 .  aspirin 81 MG tablet, Take by mouth., Disp: , Rfl:  .  cyanocobalamin 1000 MCG tablet, Take 1,000 mcg by mouth daily., Disp: , Rfl:  .  omeprazole (PRILOSEC) 20 MG capsule, Take 20 mg by mouth daily as needed., Disp: , Rfl:  .  Probiotic CAPS, Take by mouth., Disp: , Rfl:  .  citalopram (CELEXA) 20 MG tablet, Take 1 tablet (20 mg total) by mouth daily. (Patient not taking: Reported on 08/29/2016), Disp: 30 tablet, Rfl: 5 .  ondansetron (ZOFRAN) 4 MG tablet, Take 1 tablet (4 mg total) by mouth at bedtime as needed for nausea or vomiting., Disp: 20 tablet, Rfl: 0  Review of Systems    Constitutional: Positive for activity change (due to decreased interest in doing activities), diaphoresis, fatigue and unexpected weight change (gain). Negative for appetite change, chills and fever.  Eyes: Negative for visual disturbance.  Respiratory: Negative for shortness of breath.   Cardiovascular: Positive for chest pain. Negative for palpitations.  Endocrine: Positive for heat intolerance. Negative for cold intolerance, polydipsia, polyphagia and polyuria.  Neurological: Positive for light-headedness and numbness. Negative for headaches.  Psychiatric/Behavioral: The patient is nervous/anxious.     Social History  Substance Use Topics  . Smoking status: Never Smoker  . Smokeless tobacco: Never Used  . Alcohol use No   Objective:   BP 124/78 (BP Location: Left Arm, Patient Position: Sitting, Cuff Size: Normal)   Pulse 76   Temp 98 F (36.7 C) (Oral)   Resp 16   Wt 157 lb (71.2 kg)   BMI 27.81 kg/m  Vitals:   08/29/16 0909  BP: 124/78  Pulse: 76  Resp: 16  Temp: 98 F (36.7 C)  TempSrc: Oral  Weight: 157 lb (71.2 kg)     Physical Exam  Constitutional: She is oriented to person, place, and time.  HENT:  Mouth/Throat: Mucous membranes are normal.  No glossitis.  Cardiovascular: Normal rate.   Pulmonary/Chest: Effort normal.  Musculoskeletal: Normal range of motion. She exhibits no edema, tenderness or deformity.  Strength 5/5 in all extremities.  Neurological: She is alert and oriented to person, place, and time.  Sensation grossly in tact in all distal extremities.   Skin: Skin is warm and dry.  Psychiatric: Her mood appears anxious. She exhibits a depressed mood.        Assessment & Plan:     1. Paresthesias  A1C normal today. Think very low likelihood of B12 deficiency. TSH normal last time, still recommend vitamin D, can do 1000 IU daily. Will not pursue further labwork for this. Do really think this and her fatigue are related to her depression.  Recommend restarting Celexa, patient is agreeable, will give nausea medication to take one hour before medication to get through initial side effects. Will place referral to care coordination for therapy services. Patient is establishing with new physician in August, prefers to follow up with him. This is fine, we can see her in the interim if needed.  - POCT glycosylated hemoglobin (Hb A1C)  2. Nausea  - ondansetron (ZOFRAN) 4 MG tablet; Take 1 tablet (4 mg total) by mouth at bedtime as needed for nausea or vomiting.  Dispense: 20 tablet; Refill: 0  Return if symptoms worsen or fail to improve.  The entirety of the information documented in the History of Present Illness, Review of Systems and Physical Exam were personally obtained by me. Portions of this information were initially documented by Raquel Sarna  D, CMA and reviewed by me for thoroughness and accuracy.            Wanda Post, PA-C  [facility]

## 2016-08-29 NOTE — Patient Instructions (Signed)

## 2016-09-18 ENCOUNTER — Other Ambulatory Visit: Payer: Self-pay | Admitting: Family Medicine

## 2016-09-18 DIAGNOSIS — F329 Major depressive disorder, single episode, unspecified: Secondary | ICD-10-CM

## 2016-09-18 DIAGNOSIS — F410 Panic disorder [episodic paroxysmal anxiety] without agoraphobia: Secondary | ICD-10-CM

## 2016-09-18 DIAGNOSIS — F32A Depression, unspecified: Secondary | ICD-10-CM

## 2016-09-18 MED ORDER — CITALOPRAM HYDROBROMIDE 20 MG PO TABS: 20.0000 mg | ORAL_TABLET | Freq: Every day | ORAL | 4 refills | Status: DC

## 2016-09-18 NOTE — Telephone Encounter (Signed)
[facility] for the following medication. Thanks CC  citalopram (CELEXA) 20 MG tablet  >Take 1 tablet ( 20 MG Total ) by mouth daily.

## 2016-09-18 NOTE — Telephone Encounter (Signed)
Requesting 90 day supply.

## 2016-09-20 ENCOUNTER — Ambulatory Visit (INDEPENDENT_AMBULATORY_CARE_PROVIDER_SITE_OTHER): Payer: Medicare Other | Admitting: Family Medicine

## 2016-09-20 ENCOUNTER — Encounter: Payer: Self-pay | Admitting: Family Medicine

## 2016-09-20 VITALS — BP 128/82 | HR 70 | Temp 98.2°F | Resp 16 | Wt 161.0 lb

## 2016-09-20 DIAGNOSIS — H1031 Unspecified acute conjunctivitis, right eye: Secondary | ICD-10-CM | POA: Diagnosis not present

## 2016-09-20 DIAGNOSIS — J069 Acute upper respiratory infection, unspecified: Secondary | ICD-10-CM | POA: Diagnosis not present

## 2016-09-20 DIAGNOSIS — H169 Unspecified keratitis: Secondary | ICD-10-CM | POA: Diagnosis not present

## 2016-09-20 MED ORDER — OFLOXACIN 0.3 % OP SOLN: 1.0000 [drp] | Freq: Four times a day (QID) | OPHTHALMIC | 0 refills | Status: DC

## 2016-09-20 MED ORDER — AMOXICILLIN 500 MG PO CAPS: 1000.0000 mg | ORAL_CAPSULE | Freq: Three times a day (TID) | ORAL | 0 refills | Status: AC

## 2016-09-20 MED ORDER — FLUCONAZOLE 150 MG PO TABS: 150.0000 mg | ORAL_TABLET | Freq: Every day | ORAL | 0 refills | Status: DC | PRN

## 2016-09-20 MED ORDER — FLUCONAZOLE 150 MG PO TABS: 150.0000 mg | ORAL_TABLET | Freq: Once | ORAL | 0 refills | Status: DC

## 2016-09-20 NOTE — Progress Notes (Signed)
Patient: [name] Female    DOB: Jul 23, 1945   71 y.o.   MRN: [id number] Visit Date: 09/20/2016  Today's Provider: Lelon Huh, MD   Chief Complaint  Patient presents with  . Conjunctivitis   Subjective:    HPI   Patient comes in today c/o possible pink eye. She reports that she has had symptoms for the last 2 days. She reports that the pain and redness is located in her right eye. She has clear drainage. Denies any injuries. She does have history of seasonal allergies. She has been using OTC eye drops and warm compresses to help with her symptoms. She also mentions that she has PND, mild cough, and headaches.   She wears contact lenses but states she is not sure if she has taken out the contact in the affected eye. She has trouble seeing due to discharge and is not sure how are vision is otherwise.     Allergies  Allergen Reactions  . Calcium Channel Blockers     Other reaction(s): Dizziness  . Amoxicillin Other (See Comments)    Yeast infections; can take with Diflucan  . Nickel Rash  . Penicillins Other (See Comments)    Yeast infection; can take with Diflucan     Current Outpatient Prescriptions:  .  ALPRAZolam (XANAX) 0.5 MG tablet, TAKE ONE TABLET THREE TIMES A DAY AS NEEDED FOR ANXIETY, Disp: 90 tablet, Rfl: 5 .  aspirin 81 MG tablet, Take by mouth., Disp: , Rfl:  .  citalopram (CELEXA) 20 MG tablet, Take 1 tablet (20 mg total) by mouth daily., Disp: 90 tablet, Rfl: 4 .  cyanocobalamin 1000 MCG tablet, Take 1,000 mcg by mouth daily., Disp: , Rfl:  .  omeprazole (PRILOSEC) 20 MG capsule, Take 20 mg by mouth daily as needed., Disp: , Rfl:  .  ondansetron (ZOFRAN) 4 MG tablet, Take 1 tablet (4 mg total) by mouth at bedtime as needed for nausea or vomiting., Disp: 20 tablet, Rfl: 0 .  Probiotic CAPS, Take by mouth., Disp: , Rfl:   Review of Systems  HENT: Positive for congestion, postnasal drip and sneezing.   Eyes: Positive for pain, discharge, redness and  itching.  Neurological: Positive for headaches.    Social History  Substance Use Topics  . Smoking status: Never Smoker  . Smokeless tobacco: Never Used  . Alcohol use No   Objective:   BP 128/82 (BP Location: Left Arm, Patient Position: Sitting, Cuff Size: Normal)   Pulse 70   Temp 98.2 F (36.8 C)   Resp 16   Wt 161 lb (73 kg)   SpO2 97%   BMI 28.52 kg/m  Vitals:   09/20/16 0957  BP: 128/82  Pulse: 70  Resp: 16  Temp: 98.2 F (36.8 C)  SpO2: 97%  Weight: 161 lb (73 kg)     Physical Exam  General Appearance:    Alert, cooperative, no distress  HENT:   neck without nodes, pharynx erythematous without exudate, sinuses nontender and nasal mucosa congested  Eyes:   Right conjunctiva extremely injected and somewhat swollen, some clear discharge  Appears to be contact lens over cornea but difficult to visualize due to intensity of erythema and discharge.   Lungs:     Clear to auscultation bilaterally, respirations unlabored  Heart:    Regular rate and rhythm  Neurologic:   Awake, alert, oriented x 3. No apparent focal neurological  defect.           Assessment & Plan:     1. Acute bacterial conjunctivitis of right eye Advised she needs to make sure that contact lens is out. Considering intensity of inflammation she was counseled that the presence of contact lens in infected eye could threaten vitality of her eye. She is followed at [facility] and advised to go directly there for evaluation which she agreed to do.   2. Upper respiratory tract infection, unspecified type  - amoxicillin (AMOXIL) 500 MG capsule; Take 2 capsules (1,000 mg total) by mouth 3 (three) times daily.  Dispense: 30 capsule; Refill: 0 - fluconazole (DIFLUCAN) 150 MG tablet; Take 1 tablet (150 mg total) by mouth daily as needed.  Dispense: 2 tablet; Refill: 0       Lelon Huh, MD  [facility]

## 2016-09-20 NOTE — Patient Instructions (Addendum)
   Go immediately to the [facility]    Contact lenses need to be removed until your eye is completely better

## 2016-09-23 DIAGNOSIS — H169 Unspecified keratitis: Secondary | ICD-10-CM | POA: Diagnosis not present

## 2016-09-25 ENCOUNTER — Ambulatory Visit (INDEPENDENT_AMBULATORY_CARE_PROVIDER_SITE_OTHER): Payer: Medicare Other

## 2016-09-25 ENCOUNTER — Telehealth: Payer: Self-pay | Admitting: Physician Assistant

## 2016-09-25 VITALS — BP 130/82 | HR 68 | Temp 97.7°F | Ht 63.0 in | Wt 160.0 lb

## 2016-09-25 DIAGNOSIS — Z0000 Encounter for general adult medical examination without abnormal findings: Secondary | ICD-10-CM

## 2016-09-25 NOTE — Telephone Encounter (Signed)
LMTCB  Thanks,  -Aalyah Mansouri 

## 2016-09-25 NOTE — Telephone Encounter (Signed)
She should have her psychologist write it.

## 2016-09-25 NOTE — Telephone Encounter (Signed)
Patient is needing a note stating she cannot be around large crowds due to her medical illness.  She said she has obligations at church that require her to be in 'large crowds" and she cant handle that.   She said if you didn't feel comfortable giving writing the note, she would get it from her physchologist.

## 2016-09-25 NOTE — Progress Notes (Signed)
Subjective:   [name] is a 71 y.o. female who presents for Medicare Annual (Subsequent) preventive examination.  Review of Systems:  N/A  Cardiac Risk Factors include: advanced age (>24men, >14 women);dyslipidemia;hypertension     Objective:     Vitals: BP 130/82 (BP Location: Left Arm)   Pulse 68   Temp 97.7 F (36.5 C) (Oral)   Ht 5\' 3"  (1.6 m)   Wt 160 lb (72.6 kg)   BMI 28.34 kg/m   Body mass index is 28.34 kg/m.   Tobacco History  Smoking Status  . Never Smoker  Smokeless Tobacco  . Never Used     Counseling given: Not Answered   Past Medical History:  Diagnosis Date  . Anxiety   . Arthritis    lower spine  . Back pain    lower back - S/P lifting injury  . Complication of anesthesia    makes hair brittle  . GERD (gastroesophageal reflux disease)   . Hyperlipidemia   . Hypertension   . Neuromuscular disorder (HCC)    numbness legs and feet s/p lower back injury  . Stroke [facility]    "mini - strokes" 2009 - no deficit  . Vertigo    none recently  . Vitamin D deficiency   . Wears contact lenses    Past Surgical History:  Procedure Laterality Date  . ABDOMINAL HYSTERECTOMY  1987  . CARDIAC CATHETERIZATION  02/2007  . COLON SURGERY  03/12/2012  . COLONOSCOPY WITH PROPOFOL N/A 09/26/2014   Procedure: COLONOSCOPY WITH PROPOFOL;  Surgeon: Lucilla Lame, MD;  Location: [place];  Service: Endoscopy;  Laterality: N/A;  marker (tattoo) used in colon  . ESOPHAGEAL DILATION N/A 02/02/2016   Procedure: ESOPHAGEAL DILATION;  Surgeon: Lucilla Lame, MD;  Location: [place];  Service: Endoscopy;  Laterality: N/A;  . ESOPHAGOGASTRODUODENOSCOPY (EGD) WITH PROPOFOL N/A 02/02/2016   Procedure: ESOPHAGOGASTRODUODENOSCOPY (EGD) WITH PROPOFOL;  Surgeon: Lucilla Lame, MD;  Location: [place];  Service: Endoscopy;  Laterality: N/A;  . POLYPECTOMY  09/26/2014   Procedure: POLYPECTOMY;  Surgeon: Lucilla Lame, MD;  Location: [place];   Service: Endoscopy;;  . TUBAL LIGATION  1972   Family History  Problem Relation Age of Onset  . Diabetes Mother   . Heart disease Mother   . Hypertension Mother   . Mental illness Mother   . Cancer Father        lung cancer  . Drug abuse Brother   . Multiple sclerosis Brother    History  Sexual Activity  . Sexual activity: Not on file    Outpatient Encounter Prescriptions as of 09/25/2016  Medication Sig  . ALPRAZolam (XANAX) 0.5 MG tablet TAKE ONE TABLET THREE TIMES A DAY AS NEEDED FOR ANXIETY  . amoxicillin (AMOXIL) 500 MG capsule Take 2 capsules (1,000 mg total) by mouth 3 (three) times daily. (Patient taking differently: Take 500 mg by mouth 3 (three) times daily. )  . aspirin 81 MG tablet Take by mouth.  . citalopram (CELEXA) 20 MG tablet Take 1 tablet (20 mg total) by mouth daily.  . cyanocobalamin 1000 MCG tablet Take 1,000 mcg by mouth daily.  . fluconazole (DIFLUCAN) 150 MG tablet Take 1 tablet (150 mg total) by mouth daily as needed.  [name] ofloxacin (OCUFLOX) 0.3 % ophthalmic solution Place 1 drop into the right eye 4 (four) times daily. (Patient taking differently: Place 1 drop into the right eye 3 (three) times daily. )  . omeprazole (PRILOSEC)  20 MG capsule Take 20 mg by mouth daily as needed.  . ondansetron (ZOFRAN) 4 MG tablet Take 1 tablet (4 mg total) by mouth at bedtime as needed for nausea or vomiting.  . Probiotic CAPS Take by mouth.   No facility-administered encounter medications on file as of 09/25/2016.     Activities of Daily Living In your present state of health, do you have any difficulty performing the following activities: 09/25/2016 02/02/2016  Hearing? Y N  Vision? N N  Difficulty concentrating or making decisions? Y N  Walking or climbing stairs? Y N  Dressing or bathing? N N  Doing errands, shopping? N -  Preparing Food and eating ? N -  Using the Toilet? N -  In the past six months, have you accidently leaked urine? N -  Do you have problems  with loss of bowel control? N -  Managing your Medications? N -  Managing your Finances? N -  Housekeeping or managing your Housekeeping? N -  Some recent data might be hidden    Patient Care Team: Birdie Sons, MD as PCP - General (Family Medicine) Lucilla Lame, MD as Consulting Physician (Gastroenterology)    Assessment:     Exercise Activities and Dietary recommendations Current Exercise Habits: The patient does not participate in regular exercise at present, Exercise limited by: Other - see comments (depression)  Goals    . Eat more fruits and vegetables          Recommend increasing consumption of fruits and vegetables daily (2 servings).      Fall Risk Fall Risk  09/25/2016 02/21/2016  Falls in the past year? Yes No  Number falls in past yr: 1 -  Injury with Fall? No -  Follow up Falls prevention discussed -   Depression Screen PHQ 2/9 Scores 09/25/2016 02/21/2016  PHQ - 2 Score 6 1  PHQ- 9 Score 24 -     Cognitive Function     6CIT Screen 09/25/2016  What Year? 0 points  What month? 0 points  What time? 0 points  Count back from 20 0 points  Months in reverse 0 points  Repeat phrase 2 points  Total Score 2    Immunization History  Administered Date(s) Administered  . Pneumococcal Conjugate-13 01/19/2014   Screening Tests Health Maintenance  Topic Date Due  . INFLUENZA VACCINE  09/18/2016  . MAMMOGRAM  09/18/2017 (Originally 11/15/1997)  . PNA vac Low Risk Adult (2 of 2 - PPSV23) 09/18/2017 (Originally 01/20/2015)  . TETANUS/TDAP  02/18/2026 (Originally 11/16/1966)  . COLONOSCOPY  09/25/2024  . DEXA SCAN  Completed  . Hepatitis C Screening  Completed      Plan:  I have personally reviewed and addressed the Medicare Annual Wellness questionnaire and have noted the following in the patient's chart:  A. Medical and social history B. Use of alcohol, tobacco or illicit drugs  C. Current medications and supplements D. Functional ability and status E.    Nutritional status F.  Physical activity G. Advance directives H. List of other physicians I.  Hospitalizations, surgeries, and ER visits in previous 12 months J.  [place] such as hearing and vision if needed, cognitive and depression L. Referrals and appointments - none  In addition, I have reviewed and discussed with patient certain preventive protocols, quality metrics, and best practice recommendations. A written personalized care plan for preventive services as well as general preventive health recommendations were provided to patient.  See attached scanned  questionnaire for additional information.   Signed,  Fabio Neighbors, LPN Nurse Health Advisor   MD Recommendations: Pt declined Pneumovax 23 and tetanus vaccine today. Pt to set up mammogram within the next year.

## 2016-09-25 NOTE — Patient Instructions (Signed)
[name] , Thank you for taking time to come for your Medicare Wellness Visit. I appreciate your ongoing commitment to your health goals. Please review the following plan we discussed and let me know if I can assist you in the future.   Screening recommendations/referrals: Colonoscopy: completed 09/26/14, due 09/2024 Mammogram: pt to set up within the next year Bone Density: completed 09/2013 Recommended yearly ophthalmology/optometry visit for glaucoma screening and checkup Recommended yearly dental visit for hygiene and checkup  Vaccinations: Influenza vaccine: due fall 2018 Pneumococcal vaccine: Prevnar 13 given 01/2014, declined Pneumovax 23 today Tdap vaccine: declined Shingles vaccine: declined  Advanced directives: Advance directive discussed with you today. I have provided a copy for you to complete at home and have notarized. Once this is complete please bring a copy in to our office so we can scan it into your chart.  Conditions/risks identified: Fall risk prevention; Recommend increasing consumption of fruits and vegetables daily (2 servings).  Next appointment: None, need to schedule physical with PCP and 1 year AWV   Preventive Care 17 Years and Older, Female Preventive care refers to lifestyle choices and visits with your health care provider that can promote health and wellness. What does preventive care include?  A yearly physical exam. This is also called an annual well check.  Dental exams once or twice a year.  Routine eye exams. Ask your health care provider how often you should have your eyes checked.  Personal lifestyle choices, including:  Daily care of your teeth and gums.  Regular physical activity.  Eating a healthy diet.  Avoiding tobacco and drug use.  Limiting alcohol use.  Practicing safe sex.  Taking low-dose aspirin every day.  Taking vitamin and mineral supplements as recommended by your health care provider. What happens during an  annual well check? The services and screenings done by your health care provider during your annual well check will depend on your age, overall health, lifestyle risk factors, and family history of disease. Counseling  Your health care provider may ask you questions about your:  Alcohol use.  Tobacco use.  Drug use.  Emotional well-being.  Home and relationship well-being.  Sexual activity.  Eating habits.  History of falls.  Memory and ability to understand (cognition).  Work and work Statistician.  Reproductive health. Screening  You may have the following tests or measurements:  Height, weight, and BMI.  Blood pressure.  Lipid and cholesterol levels. These may be checked every 5 years, or more frequently if you are over 71 years old.  Skin check.  Lung cancer screening. You may have this screening every year starting at age 69 if you have a 30-pack-year history of smoking and currently smoke or have quit within the past 15 years.  Fecal occult blood test (FOBT) of the stool. You may have this test every year starting at age 65.  Flexible sigmoidoscopy or colonoscopy. You may have a sigmoidoscopy every 5 years or a colonoscopy every 10 years starting at age 68.  Hepatitis C blood test.  Hepatitis B blood test.  Sexually transmitted disease (STD) testing.  Diabetes screening. This is done by checking your blood sugar (glucose) after you have not eaten for a while (fasting). You may have this done every 1-3 years.  Bone density scan. This is done to screen for osteoporosis. You may have this done starting at age 63.  Mammogram. This may be done every 1-2 years. Talk to your health care provider about how often you  should have regular mammograms. Talk with your health care provider about your test results, treatment options, and if necessary, the need for more tests. Vaccines  Your health care provider may recommend certain vaccines, such as:  Influenza  vaccine. This is recommended every year.  Tetanus, diphtheria, and acellular pertussis (Tdap, Td) vaccine. You may need a Td booster every 10 years.  Zoster vaccine. You may need this after age 13.  Pneumococcal 13-valent conjugate (PCV13) vaccine. One dose is recommended after age 56.  Pneumococcal polysaccharide (PPSV23) vaccine. One dose is recommended after age 47. Talk to your health care provider about which screenings and vaccines you need and how often you need them. This information is not intended to replace advice given to you by your health care provider. Make sure you discuss any questions you have with your health care provider. Document Released: 03/03/2015 Document Revised: 10/25/2015 Document Reviewed: 12/06/2014 Elsevier Interactive Patient Education  2017 [place] Prevention in the Home Falls can cause injuries. They can happen to people of all ages. There are many things you can do to make your home safe and to help prevent falls. What can I do on the outside of my home?  Regularly fix the edges of walkways and driveways and fix any cracks.  Remove anything that might make you trip as you walk through a door, such as a raised step or threshold.  Trim any bushes or trees on the path to your home.  Use bright outdoor lighting.  Clear any walking paths of anything that might make someone trip, such as rocks or tools.  Regularly check to see if handrails are loose or broken. Make sure that both sides of any steps have handrails.  Any raised decks and porches should have guardrails on the edges.  Have any leaves, snow, or ice cleared regularly.  Use sand or salt on walking paths during winter.  Clean up any spills in your garage right away. This includes oil or grease spills. What can I do in the bathroom?  Use night lights.  Install grab bars by the toilet and in the tub and shower. Do not use towel bars as grab bars.  Use non-skid mats or decals  in the tub or shower.  If you need to sit down in the shower, use a plastic, non-slip stool.  Keep the floor dry. Clean up any water that spills on the floor as soon as it happens.  Remove soap buildup in the tub or shower regularly.  Attach bath mats securely with double-sided non-slip rug tape.  Do not have throw rugs and other things on the floor that can make you trip. What can I do in the bedroom?  Use night lights.  Make sure that you have a light by your bed that is easy to reach.  Do not use any sheets or blankets that are too big for your bed. They should not hang down onto the floor.  Have a firm chair that has side arms. You can use this for support while you get dressed.  Do not have throw rugs and other things on the floor that can make you trip. What can I do in the kitchen?  Clean up any spills right away.  Avoid walking on wet floors.  Keep items that you use a lot in easy-to-reach places.  If you need to reach something above you, use a strong step stool that has a grab bar.  Keep electrical cords out  of the way.  Do not use floor polish or wax that makes floors slippery. If you must use wax, use non-skid floor wax.  Do not have throw rugs and other things on the floor that can make you trip. What can I do with my stairs?  Do not leave any items on the stairs.  Make sure that there are handrails on both sides of the stairs and use them. Fix handrails that are broken or loose. Make sure that handrails are as long as the stairways.  Check any carpeting to make sure that it is firmly attached to the stairs. Fix any carpet that is loose or worn.  Avoid having throw rugs at the top or bottom of the stairs. If you do have throw rugs, attach them to the floor with carpet tape.  Make sure that you have a light switch at the top of the stairs and the bottom of the stairs. If you do not have them, ask someone to add them for you. What else can I do to help  prevent falls?  Wear shoes that:  Do not have high heels.  Have rubber bottoms.  Are comfortable and fit you well.  Are closed at the toe. Do not wear sandals.  If you use a stepladder:  Make sure that it is fully opened. Do not climb a closed stepladder.  Make sure that both sides of the stepladder are locked into place.  Ask someone to hold it for you, if possible.  Clearly mark and make sure that you can see:  Any grab bars or handrails.  First and last steps.  Where the edge of each step is.  Use tools that help you move around (mobility aids) if they are needed. These include:  Canes.  Walkers.  Scooters.  Crutches.  Turn on the lights when you go into a dark area. Replace any light bulbs as soon as they burn out.  Set up your furniture so you have a clear path. Avoid moving your furniture around.  If any of your floors are uneven, fix them.  If there are any pets around you, be aware of where they are.  Review your medicines with your doctor. Some medicines can make you feel dizzy. This can increase your chance of falling. Ask your doctor what other things that you can do to help prevent falls. This information is not intended to replace advice given to you by your health care provider. Make sure you discuss any questions you have with your health care provider. Document Released: 12/01/2008 Document Revised: 07/13/2015 Document Reviewed: 03/11/2014 Elsevier Interactive Patient Education  2017 [employer].

## 2016-09-26 ENCOUNTER — Other Ambulatory Visit: Payer: Self-pay | Admitting: Family Medicine

## 2016-09-26 DIAGNOSIS — R110 Nausea: Secondary | ICD-10-CM

## 2016-09-26 DIAGNOSIS — J069 Acute upper respiratory infection, unspecified: Secondary | ICD-10-CM

## 2016-09-26 NOTE — Telephone Encounter (Signed)
Pt contacted office for refill request on the following medications:  ondansetron (ZOFRAN) 4 MG tablet.  For nausea.  fluconazole (DIFLUCAN) 150 MG tablet    [facility].  WN#[phone]/FG

## 2016-09-27 MED ORDER — FLUCONAZOLE 150 MG PO TABS: 150.0000 mg | ORAL_TABLET | Freq: Every day | ORAL | 1 refills | Status: DC | PRN

## 2016-09-27 MED ORDER — ONDANSETRON HCL 4 MG PO TABS
4.0000 mg | ORAL_TABLET | Freq: Every evening | ORAL | 2 refills | Status: DC | PRN
Start: 2016-09-27 — End: 2016-12-09

## 2016-10-15 ENCOUNTER — Other Ambulatory Visit: Payer: Self-pay | Admitting: Family Medicine

## 2016-10-15 DIAGNOSIS — F419 Anxiety disorder, unspecified: Secondary | ICD-10-CM

## 2016-10-15 NOTE — Telephone Encounter (Signed)
Please call in alprazolam.  

## 2016-10-15 NOTE — Telephone Encounter (Signed)
Rx called in to pharmacy. 

## 2016-10-15 NOTE — Telephone Encounter (Signed)
See refill request.

## 2016-10-30 ENCOUNTER — Ambulatory Visit (INDEPENDENT_AMBULATORY_CARE_PROVIDER_SITE_OTHER): Payer: Medicare Other | Admitting: Family Medicine

## 2016-10-30 ENCOUNTER — Encounter: Payer: Self-pay | Admitting: Family Medicine

## 2016-10-30 VITALS — BP 116/82 | HR 75 | Temp 98.1°F | Wt 159.4 lb

## 2016-10-30 DIAGNOSIS — J069 Acute upper respiratory infection, unspecified: Secondary | ICD-10-CM | POA: Diagnosis not present

## 2016-10-30 DIAGNOSIS — B719 Cestode infection, unspecified: Secondary | ICD-10-CM | POA: Diagnosis not present

## 2016-10-30 MED ORDER — PRAZIQUANTEL 600 MG PO TABS: ORAL_TABLET | ORAL | 1 refills | Status: DC

## 2016-10-30 NOTE — Patient Instructions (Addendum)
Discussed use of Mucinex D for congestion and Delsym for cough. If you are not improving by next Monday or you develop a fever to call us.

## 2016-10-30 NOTE — Progress Notes (Signed)
Subjective:     Patient ID: [name], female   DOB: February 15, 1946, 71 y.o.   MRN: [id number]  HPI  Chief Complaint  Patient presents with  . Sinusitis    Patient complains of congestion, cough, headache,rhinorrhea, runny eyes, and sputum production. She states symptoms started 2 days ago. She tried OTC medications with no relief.   . Tape worms    Patient states she has a positive result she was given by Dr. Toy Cookey a veterniarian. She states he dog has tape worm and asked them to test her also. Results came back positive for tapeworm, 4 Dipylidium egg and lava. Patient denies any new GI symptoms.   Reports cold like sx over the last two days including sore throat. Also was discovered to have the dog tapeworm and wishes treatment. Brings written confirmation for her vet, Dr. Toy Cookey.   Review of Systems     Objective:   Physical Exam  Constitutional: She appears well-developed and well-nourished. No distress.  Ears: T.M's intact without inflammation Sinuses; mild frontal tenderness Throat: tonsils absent without erythema Neck: no cervical adenopathy Lungs: Posterior expiratory "sighing" wheezes.     Assessment:    1. Viral upper respiratory tract infection  2. Tapeworm infection: single dose at 10 mg/Kg - praziquantel (BILTRICIDE) 600 MG tablet; One and 1/3 tablet today  Dispense: 2 tablet; Refill: 1    Plan:    Discussed continued symptomatic treatment. Will call in the next few days if not improving.

## 2016-10-31 ENCOUNTER — Ambulatory Visit: Payer: Self-pay | Admitting: Family Medicine

## 2016-11-05 ENCOUNTER — Telehealth: Payer: Self-pay | Admitting: Family Medicine

## 2016-11-05 MED ORDER — AZITHROMYCIN 250 MG PO TABS: ORAL_TABLET | ORAL | 0 refills | Status: AC

## 2016-11-05 NOTE — Telephone Encounter (Signed)
Pt was in last week for cough and sinus.  She is not better and is calling back for more medication.  She needs something for cough and an antibiotic  She uses [facility]  Her call back is [phone]  Please call her before you call something in  [place]

## 2016-11-05 NOTE — Telephone Encounter (Signed)
Prescription zpack sent to [facility]

## 2016-11-05 NOTE — Telephone Encounter (Signed)
Please advise 

## 2016-12-09 ENCOUNTER — Ambulatory Visit (INDEPENDENT_AMBULATORY_CARE_PROVIDER_SITE_OTHER): Payer: Medicare Other | Admitting: Family Medicine

## 2016-12-09 ENCOUNTER — Encounter: Payer: Self-pay | Admitting: Family Medicine

## 2016-12-09 VITALS — BP 130/80 | HR 82 | Temp 99.0°F | Resp 18 | Wt 166.0 lb

## 2016-12-09 DIAGNOSIS — R05 Cough: Secondary | ICD-10-CM | POA: Diagnosis not present

## 2016-12-09 DIAGNOSIS — J40 Bronchitis, not specified as acute or chronic: Secondary | ICD-10-CM

## 2016-12-09 DIAGNOSIS — R059 Cough, unspecified: Secondary | ICD-10-CM

## 2016-12-09 DIAGNOSIS — J019 Acute sinusitis, unspecified: Secondary | ICD-10-CM

## 2016-12-09 MED ORDER — FLUCONAZOLE 150 MG PO TABS: 150.0000 mg | ORAL_TABLET | Freq: Once | ORAL | 0 refills | Status: AC

## 2016-12-09 MED ORDER — AMOXICILLIN-POT CLAVULANATE 875-125 MG PO TABS: 1.0000 | ORAL_TABLET | Freq: Two times a day (BID) | ORAL | 0 refills | Status: DC

## 2016-12-09 MED ORDER — HYDROCODONE-HOMATROPINE 5-1.5 MG/5ML PO SYRP: 5.0000 mL | ORAL_SOLUTION | Freq: Three times a day (TID) | ORAL | 0 refills | Status: DC | PRN

## 2016-12-09 NOTE — Progress Notes (Signed)
Patient: [name] Female    DOB: 16-Jun-1945   71 y.o.   MRN: [id number] Visit Date: 12/09/2016  Today's Provider: Lelon Huh, MD   Chief Complaint  Patient presents with  . URI   Subjective:    URI   This is a new problem. Episode onset: 2 days ago. The problem has been gradually worsening. There has been no fever. Associated symptoms include congestion, coughing (productive with dark green/ brown/ blood tinged sputum), headaches, a plugged ear sensation (right ear), rhinorrhea, sinus pain, sneezing and a sore throat. Pertinent negatives include no abdominal pain, chest pain, diarrhea, dysuria, ear pain, joint pain, joint swelling, nausea, neck pain, rash, swollen glands, vomiting or wheezing. Treatments tried: OTC Cough and cold medication/ Advil cold and Sinus. The treatment provided no relief.       Allergies  Allergen Reactions  . Calcium Channel Blockers     Other reaction(s): Dizziness  . Amoxicillin Other (See Comments)    Yeast infections; can take with Diflucan  . Nickel Rash  . Penicillins Other (See Comments)    Yeast infection; can take with Diflucan     Current Outpatient Prescriptions:  .  ALPRAZolam (XANAX) 0.5 MG tablet, TAKE ONE TABLET THREE TIMES A DAY AS NEEDED FOR ANXIETY, Disp: 90 tablet, Rfl: 5 .  aspirin 81 MG tablet, Take by mouth., Disp: , Rfl:  .  citalopram (CELEXA) 20 MG tablet, Take 1 tablet (20 mg total) by mouth daily., Disp: 90 tablet, Rfl: 4 .  cyanocobalamin 1000 MCG tablet, Take 1,000 mcg by mouth daily., Disp: , Rfl:  .  omeprazole (PRILOSEC) 20 MG capsule, Take 20 mg by mouth daily as needed., Disp: , Rfl:  .  ondansetron (ZOFRAN) 4 MG tablet, Take 1 tablet (4 mg total) by mouth at bedtime as needed for nausea or vomiting., Disp: 20 tablet, Rfl: 2 .  praziquantel (BILTRICIDE) 600 MG tablet, One and 1/3 tablet today, Disp: 2 tablet, Rfl: 1 .  Probiotic CAPS, Take by mouth., Disp: , Rfl:   Review of Systems  Constitutional:  Positive for chills and diaphoresis. Negative for appetite change, fatigue and fever.  HENT: Positive for congestion, rhinorrhea, sinus pain, sneezing, sore throat and voice change. Negative for ear pain.   Respiratory: Positive for cough (productive with dark green/ brown/ blood tinged sputum) and shortness of breath. Negative for chest tightness and wheezing.   Cardiovascular: Negative for chest pain and palpitations.  Gastrointestinal: Negative for abdominal pain, diarrhea, nausea and vomiting.  Genitourinary: Negative for dysuria.  Musculoskeletal: Negative for joint pain and neck pain.  Skin: Negative for rash.  Neurological: Positive for headaches. Negative for dizziness and weakness.    Social History  Substance Use Topics  . Smoking status: Never Smoker  . Smokeless tobacco: Never Used  . Alcohol use No   Objective:   BP 130/80 (BP Location: Left Arm, Patient Position: Sitting, Cuff Size: Large)   Pulse 82   Temp 99 F (37.2 C) (Oral)   Resp 18   Wt 166 lb (75.3 kg)   SpO2 96% Comment: room air  BMI 29.41 kg/m  There were no vitals filed for this visit.   Physical Exam  General Appearance:    Alert, cooperative, no distress  HENT:   both sides TM normal without fluid or infection, neck without nodes, throat normal without erythema or exudate, frontal and maxillary sinuses tender and nasal mucosa pale and congested  Eyes:  PERRL, conjunctiva/corneas clear, EOM's intact       Lungs:     Clear to auscultation bilaterally, respirations unlabored  Heart:    Regular rate and rhythm  Neurologic:   Awake, alert, oriented x 3. No apparent focal neurological           defect.           Assessment & Plan:     1. Acute sinusitis, recurrence not specified, unspecified location  - amoxicillin-clavulanate (AUGMENTIN) 875-125 MG tablet; Take 1 tablet by mouth 2 (two) times daily.  Dispense: 20 tablet; Refill: 0 - fluconazole (DIFLUCAN) 150 MG tablet; Take 1 tablet (150 mg  total) by mouth once.  Dispense: 1 tablet; Refill: 0  2. Cough  - HYDROcodone-homatropine (HYCODAN) 5-1.5 MG/5ML syrup; Take 5 mLs by mouth every 8 (eight) hours as needed for cough.  Dispense: 120 mL; Refill: 0  3. Bronchitis  - amoxicillin-clavulanate (AUGMENTIN) 875-125 MG tablet; Take 1 tablet by mouth 2 (two) times daily.  Dispense: 20 tablet; Refill: 0 - fluconazole (DIFLUCAN) 150 MG tablet; Take 1 tablet (150 mg total) by mouth once.  Dispense: 1 tablet; Refill: 0       Lelon Huh, MD  [facility]

## 2016-12-16 ENCOUNTER — Ambulatory Visit
Admission: RE | Admit: 2016-12-16 | Discharge: 2016-12-16 | Disposition: A | Discharge status: Home / Self Care | Payer: Medicare Other | Source: Ambulatory Visit | Attending: Family Medicine | Admitting: Family Medicine

## 2016-12-16 ENCOUNTER — Telehealth: Payer: Self-pay | Admitting: Family Medicine

## 2016-12-16 ENCOUNTER — Encounter: Payer: Self-pay | Admitting: Family Medicine

## 2016-12-16 ENCOUNTER — Ambulatory Visit (INDEPENDENT_AMBULATORY_CARE_PROVIDER_SITE_OTHER): Payer: Medicare Other | Admitting: Family Medicine

## 2016-12-16 VITALS — BP 110/70 | HR 75 | Temp 98.5°F | Resp 18 | Wt 164.0 lb

## 2016-12-16 DIAGNOSIS — R059 Cough, unspecified: Secondary | ICD-10-CM

## 2016-12-16 DIAGNOSIS — R079 Chest pain, unspecified: Secondary | ICD-10-CM | POA: Insufficient documentation

## 2016-12-16 DIAGNOSIS — R05 Cough: Secondary | ICD-10-CM

## 2016-12-16 DIAGNOSIS — R0602 Shortness of breath: Secondary | ICD-10-CM | POA: Diagnosis not present

## 2016-12-16 MED ORDER — HYDROCODONE-HOMATROPINE 5-1.5 MG/5ML PO SYRP: 5.0000 mL | ORAL_SOLUTION | Freq: Three times a day (TID) | ORAL | 0 refills | Status: DC | PRN

## 2016-12-16 MED ORDER — LEVOFLOXACIN 750 MG PO TABS: 750.0000 mg | ORAL_TABLET | Freq: Every day | ORAL | 0 refills | Status: DC

## 2016-12-16 NOTE — Progress Notes (Signed)
Patient: [name] Female    DOB: 06-28-1945   71 y.o.   MRN: [id number] Visit Date: 12/16/2016  Today's Provider: Lelon Huh, MD   Chief Complaint  Patient presents with  . Cough   Subjective:    Cough  The current episode started 1 to 4 weeks ago (Patient was seen in the office 1 week ago for Acute Sinusitis and cough brinchitis. Patient was started on Augmentin and given a prescription for Hycodan cough syrup). The problem has been gradually worsening. The cough is non-productive. Associated symptoms include chest pain, chills, ear congestion (right ear), a fever (at night), headaches, nasal congestion, postnasal drip, sweats and wheezing. Pertinent negatives include no ear pain, hemoptysis, rhinorrhea or shortness of breath. Treatments tried: Antibiotic and prescription cough syrup. The treatment provided moderate relief.  Patient comes in today stating she still has a dry cough and has no energy. She is more short of breath and feels pressure in the left part of her chest and back the last two days. Patient has completed all doses of the antibiotic that was prescribed.      Allergies  Allergen Reactions  . Calcium Channel Blockers     Other reaction(s): Dizziness  . Amoxicillin Other (See Comments)    Yeast infections; can take with Diflucan  . Nickel Rash  . Penicillins Other (See Comments)    Yeast infection; can take with Diflucan     Current Outpatient Prescriptions:  .  ALPRAZolam (XANAX) 0.5 MG tablet, TAKE ONE TABLET THREE TIMES A DAY AS NEEDED FOR ANXIETY, Disp: 90 tablet, Rfl: 5 .  aspirin 81 MG tablet, Take by mouth., Disp: , Rfl:  .  citalopram (CELEXA) 20 MG tablet, Take 1 tablet (20 mg total) by mouth daily., Disp: 90 tablet, Rfl: 4 .  cyanocobalamin 1000 MCG tablet, Take 1,000 mcg by mouth daily., Disp: , Rfl:  .  HYDROcodone-homatropine (HYCODAN) 5-1.5 MG/5ML syrup, Take 5 mLs by mouth every 8 (eight) hours as needed for cough., Disp: 120 mL,  Rfl: 0 .  omeprazole (PRILOSEC) 20 MG capsule, Take 20 mg by mouth daily as needed., Disp: , Rfl:  .  Probiotic CAPS, Take by mouth., Disp: , Rfl:  .  amoxicillin-clavulanate (AUGMENTIN) 875-125 MG tablet, Take 1 tablet by mouth 2 (two) times daily. (Patient not taking: Reported on 12/16/2016), Disp: 20 tablet, Rfl: 0  Review of Systems  Constitutional: Positive for chills, diaphoresis, fatigue and fever (at night). Negative for appetite change.  HENT: Positive for postnasal drip. Negative for ear pain and rhinorrhea.   Respiratory: Positive for cough, chest tightness (pressure in the right side of her chest when deep breathing) and wheezing. Negative for hemoptysis and shortness of breath.   Cardiovascular: Positive for chest pain. Negative for palpitations.  Gastrointestinal: Negative for abdominal pain, nausea and vomiting.  Neurological: Positive for headaches. Negative for dizziness and weakness.    Social History  Substance Use Topics  . Smoking status: Never Smoker  . Smokeless tobacco: Never Used  . Alcohol use No   Objective:   BP 110/70 (BP Location: Left Arm, Patient Position: Sitting, Cuff Size: Normal)   Pulse 75   Temp 98.5 F (36.9 C) (Oral)   Resp 18   Wt 164 lb (74.4 kg)   SpO2 97% Comment: room air  BMI 29.05 kg/m  There were no vitals filed for this visit.   Physical Exam  General Appearance:    Alert, cooperative, no distress  HENT:   ENT exam normal, no neck nodes or sinus tenderness  Eyes:    PERRL, conjunctiva/corneas clear, EOM's intact       Lungs:     Occasional expiratory wheeze, no rales, , respirations unlabored  Heart:    Regular rate and rhythm  Neurologic:   Awake, alert, oriented x 3. No apparent focal neurological           defect.           Assessment & Plan:     1. Chest pain, unspecified type  - EKG 12-Lead - DG Chest 2 View; Future  2. Cough  - DG Chest 2 View; Future - HYDROcodone-homatropine (HYCODAN) 5-1.5 MG/5ML syrup;  Take 5 mLs by mouth every 8 (eight) hours as needed for cough.  Dispense: 120 mL; Refill: 0  Call if symptoms change or if not rapidly improving.          Lelon Huh, MD  [facility]

## 2016-12-16 NOTE — Telephone Encounter (Signed)
No pneumonia on xray. Have sent prescription for levofloxacin to [facility]. There is an interaction with citalopram, so she needs need to reduce citalopram dose to 1/2 tablet daily while on this antibiotic. Also, please call pharmacy and advise that we are aware of interaction and are temporarily adjusting dose of citalopram.

## 2016-12-17 NOTE — Telephone Encounter (Signed)
Patient was notified. Also advised pharmacy.

## 2016-12-18 ENCOUNTER — Telehealth: Payer: Self-pay | Admitting: Family Medicine

## 2016-12-18 NOTE — Telephone Encounter (Signed)
Pt states she is not able to take the Rx levofloxacin (LEVAQUIN) 750 MG tablet.  Pt states this is making her face, throat and tongue feel swollen. Pt states she is not feeling swollen right now but she has not taken the medication since yesterday.  Pt is asking if she can get something different.  [facility].  NO#[phone]/HU

## 2016-12-18 NOTE — Telephone Encounter (Signed)
Please advise 

## 2016-12-19 MED ORDER — AZITHROMYCIN 250 MG PO TABS: ORAL_TABLET | ORAL | 0 refills | Status: AC

## 2016-12-23 ENCOUNTER — Encounter: Payer: Self-pay | Admitting: Family Medicine

## 2016-12-23 ENCOUNTER — Ambulatory Visit (INDEPENDENT_AMBULATORY_CARE_PROVIDER_SITE_OTHER): Payer: Medicare Other | Admitting: Family Medicine

## 2016-12-23 DIAGNOSIS — F419 Anxiety disorder, unspecified: Secondary | ICD-10-CM

## 2016-12-23 DIAGNOSIS — F329 Major depressive disorder, single episode, unspecified: Secondary | ICD-10-CM

## 2016-12-23 DIAGNOSIS — F32A Depression, unspecified: Secondary | ICD-10-CM

## 2016-12-23 MED ORDER — ALPRAZOLAM 0.5 MG PO TABS: ORAL_TABLET | ORAL | 0 refills | Status: DC

## 2016-12-23 MED ORDER — CITALOPRAM HYDROBROMIDE 20 MG PO TABS: 10.0000 mg | ORAL_TABLET | Freq: Every day | ORAL | 0 refills | Status: DC

## 2016-12-23 NOTE — Patient Instructions (Signed)
   Reduce xanax to 1/2 tablet two times a day for one month, then 1/2 tablet every night for one month, then stop or call for prescription for lower dose of medication

## 2016-12-23 NOTE — Progress Notes (Signed)
Patient: [name] Female    DOB: September 23, 1945   71 y.o.   MRN: [id number] Visit Date: 12/23/2016  Today's Provider: Lelon Huh, MD   No chief complaint on file.  Subjective:    HPI  Patient states she does not want to take any habit forming medications and would like to get off of alprazolam and citalopram. She has been seeing psychologist and feels her anxiety is not so bad. She states she can tell when medication is wearing and gets anxious when due for next dose. She usually takes one 0.5mg  tablet at 7am and 7pm, but sometimes takes one mid day. She doesn't feel depressed.    She also reports her cough, shortness of breath and chest tightness have greatly improved since visit last week.   Allergies  Allergen Reactions  . Levofloxacin     Tongue and mouth swelling  . Calcium Channel Blockers     Other reaction(s): Dizziness  . Amoxicillin Other (See Comments)    Yeast infections; can take with Diflucan  . Nickel Rash  . Penicillins Other (See Comments)    Yeast infection; can take with Diflucan     Current Outpatient Medications:  .  ALPRAZolam (XANAX) 0.5 MG tablet, TAKE ONE TABLET THREE TIMES A DAY AS NEEDED FOR ANXIETY, Disp: 90 tablet, Rfl: 5 .  aspirin 81 MG tablet, Take by mouth., Disp: , Rfl:  .  azithromycin (ZITHROMAX) 250 MG tablet, 2 by mouth today, then 1 daily for 4 days, Disp: 6 tablet, Rfl: 0 .  citalopram (CELEXA) 20 MG tablet, Take 1 tablet (20 mg total) by mouth daily., Disp: 90 tablet, Rfl: 4 .  cyanocobalamin 1000 MCG tablet, Take 1,000 mcg by mouth daily., Disp: , Rfl:  .  HYDROcodone-homatropine (HYCODAN) 5-1.5 MG/5ML syrup, Take 5 mLs by mouth every 8 (eight) hours as needed for cough., Disp: 120 mL, Rfl: 0 .  omeprazole (PRILOSEC) 20 MG capsule, Take 20 mg by mouth daily as needed., Disp: , Rfl:  .  Probiotic CAPS, Take by mouth., Disp: , Rfl:   Review of Systems  Constitutional: Negative for appetite change, chills, fatigue and  fever.  Respiratory: Negative for chest tightness and shortness of breath.   Cardiovascular: Negative for chest pain and palpitations.  Gastrointestinal: Negative for abdominal pain, nausea and vomiting.  Neurological: Negative for dizziness and weakness.    Social History   Tobacco Use  . Smoking status: Never Smoker  . Smokeless tobacco: Never Used  Substance Use Topics  . Alcohol use: No    Alcohol/week: 0.0 oz   Objective:   BP 120/80 (BP Location: Right Arm, Patient Position: Sitting, Cuff Size: Normal)   Pulse 69   Temp 98 F (36.7 C) (Oral)   Resp 16   Wt 164 lb (74.4 kg)   SpO2 98%   BMI 29.05 kg/m  There were no vitals filed for this visit.   Physical Exam  General appearance: alert, well developed, well nourished, cooperative and in no distress Head: Normocephalic, without obvious abnormality, atraumatic Respiratory: Respirations even and unlabored, normal respiratory rate Extremities: No gross deformities Skin: Skin color, texture, turgor normal. No rashes seen  Psych: Appropriate mood and affect. Neurologic: Mental status: Alert, oriented to person, place, and time, thought content appropriate.     Assessment & Plan:     1. Acute anxiety  - ALPRAZolam (XANAX) 0.5 MG tablet; 1/2 tablet twice times a day for one month, then 1/2 tablet  every night for one month  Dispense: 3 tablet; Refill: 0  2. Depression, unspecified depression type  - citalopram (CELEXA) 20 MG tablet; Take 0.5 tablets (10 mg total) daily by mouth.  Dispense: 3 tablet; Refill: 0   Discusses prudent weaning schedule  She will reduce alprazolam to 1/2 tablet twice times a day for one month, then 1/2 tablet every night for one month  She also wants to get off citalopram. Advised can try reducing to 1/2 tablet, but recommend she make weaning alprazolam the priority.        Lelon Huh, MD  [facility]

## 2017-01-03 ENCOUNTER — Ambulatory Visit (INDEPENDENT_AMBULATORY_CARE_PROVIDER_SITE_OTHER): Payer: Medicare Other | Admitting: Family Medicine

## 2017-01-03 ENCOUNTER — Other Ambulatory Visit: Payer: Self-pay | Admitting: Family Medicine

## 2017-01-03 VITALS — BP 138/80 | HR 74 | Temp 98.1°F | Resp 14 | Wt 161.0 lb

## 2017-01-03 DIAGNOSIS — R0609 Other forms of dyspnea: Secondary | ICD-10-CM

## 2017-01-03 DIAGNOSIS — R079 Chest pain, unspecified: Secondary | ICD-10-CM

## 2017-01-03 DIAGNOSIS — F32A Depression, unspecified: Secondary | ICD-10-CM

## 2017-01-03 DIAGNOSIS — F329 Major depressive disorder, single episode, unspecified: Secondary | ICD-10-CM

## 2017-01-03 DIAGNOSIS — R0600 Dyspnea, unspecified: Secondary | ICD-10-CM

## 2017-01-03 MED ORDER — DOXYCYCLINE HYCLATE 100 MG PO TABS: 100.0000 mg | ORAL_TABLET | Freq: Two times a day (BID) | ORAL | 0 refills | Status: DC

## 2017-01-03 MED ORDER — FLUCONAZOLE 150 MG PO TABS: 150.0000 mg | ORAL_TABLET | Freq: Once | ORAL | 0 refills | Status: AC

## 2017-01-03 NOTE — Telephone Encounter (Signed)
[facility] on the following medications:  citalopram (CELEXA) 20 MG tablet  [facility] [place]

## 2017-01-03 NOTE — Progress Notes (Signed)
Patient: [name] Female    DOB: Jun 26, 1945   71 y.o.   MRN: [id number] Visit Date: 01/03/2017  Today's Provider: Lelon Huh, MD   Chief Complaint  Patient presents with  . Cough   Subjective:    Pt reports that she started feeling bad again yesterday. She was treated for bronchitis and sinusitis about a month ago with Augmentin. She reports that the chest tightness and shortness of breath has returned every since she was initially treated for the bronchitis she has had nausea but she is schedule to GI for this. She reports that she has chills. She also has cough, sinus pressure and headache. Cough is productice with green sputum.    Cough  Associated symptoms include chest pain (chest tightness), chills and shortness of breath. Pertinent negatives include no fever.   She states that cough and shortness of breath had resolved after finishing Augmentin as above, but returned just as bad about 2 days ago.     Allergies  Allergen Reactions  . Levofloxacin     Tongue and mouth swelling  . Calcium Channel Blockers     Other reaction(s): Dizziness  . Amoxicillin Other (See Comments)    Yeast infections; can take with Diflucan  . Nickel Rash  . Penicillins Other (See Comments)    Yeast infection; can take with Diflucan     Current Outpatient Medications:  .  ALPRAZolam (XANAX) 0.5 MG tablet, 1/2 tablet twice times a day for one month, then 1/2 tablet every night for one month, Disp: 3 tablet, Rfl: 0 .  aspirin 81 MG tablet, Take by mouth., Disp: , Rfl:  .  citalopram (CELEXA) 20 MG tablet, Take 0.5 tablets (10 mg total) daily by mouth., Disp: 3 tablet, Rfl: 0 .  cyanocobalamin 1000 MCG tablet, Take 1,000 mcg by mouth daily., Disp: , Rfl:  .  omeprazole (PRILOSEC) 20 MG capsule, Take 20 mg by mouth daily as needed., Disp: , Rfl:  .  Probiotic CAPS, Take by mouth., Disp: , Rfl:   Review of Systems  Constitutional: Positive for appetite change, chills and fatigue.  Negative for fever.  Respiratory: Positive for cough, chest tightness and shortness of breath.   Cardiovascular: Positive for chest pain (chest tightness). Negative for palpitations.  Gastrointestinal: Positive for nausea. Negative for abdominal pain and vomiting.  Neurological: Positive for weakness. Negative for dizziness.    Social History   Tobacco Use  . Smoking status: Never Smoker  . Smokeless tobacco: Never Used  Substance Use Topics  . Alcohol use: No    Alcohol/week: 0.0 oz   Objective:   BP 138/80 (BP Location: Right Arm, Patient Position: Sitting, Cuff Size: Normal)   Pulse 74   Temp 98.1 F (36.7 C) (Oral)   Resp 14   Wt 161 lb (73 kg)   SpO2 98%   BMI 28.52 kg/m  Vitals:   01/03/17 1056  BP: 138/80  Pulse: 74  Resp: 14  Temp: 98.1 F (36.7 C)  TempSrc: Oral  SpO2: 98%  Weight: 161 lb (73 kg)     Physical Exam  General Appearance:    Alert, cooperative, no distress  HENT:   ENT exam normal, no neck nodes or sinus tenderness  Eyes:    PERRL, conjunctiva/corneas clear, EOM's intact       Lungs:    Occasional expiratory wheeze, no rales, , respirations unlabored  Heart:    Regular rate and rhythm  Neurologic:  Awake, alert, oriented x 3. No apparent focal neurological           defect.           Assessment & Plan:     1. Dyspnea on exertion Likely bronchitis, but need to rule out cardiac dysfunction and PE.  - CBC - D-dimer, quantitative (not at [facility]) - Brain natriuretic peptide - Troponin I  2. Chest pain, unspecified type  - CBC - D-dimer, quantitative (not at [facility]) - Brain natriuretic peptide - Troponin I  Cover with doxycycline while awaiting results.       Lelon Huh, MD  [facility]

## 2017-01-04 LAB — CBC
HCT: 43.3 % (ref 35.0–45.0)
Hemoglobin: 14.7 g/dL (ref 11.7–15.5)
MCH: 30.3 pg (ref 27.0–33.0)
MCHC: 33.9 g/dL (ref 32.0–36.0)
MCV: 89.3 fL (ref 80.0–100.0)
MPV: 10.7 fL (ref 7.5–12.5)
Platelets: 258 10*3/uL (ref 140–400)
RBC: 4.85 10*6/uL (ref 3.80–5.10)
RDW: 12.6 % (ref 11.0–15.0)
WBC: 5.2 10*3/uL (ref 3.8–10.8)

## 2017-01-04 LAB — TROPONIN I: Troponin I: <0.01 ng/mL (ref <=0.0)

## 2017-01-04 LAB — BRAIN NATRIURETIC PEPTIDE: Brain Natriuretic Peptide: 31 pg/mL (ref <100)

## 2017-01-04 LAB — D-DIMER, QUANTITATIVE (NOT AT ARMC): D-Dimer, Quant: 0.45 mcg/mL FEU (ref <0.50)

## 2017-01-04 MED ORDER — CITALOPRAM HYDROBROMIDE 20 MG PO TABS: 20.0000 mg | ORAL_TABLET | Freq: Every day | ORAL | 4 refills | Status: DC

## 2017-01-06 ENCOUNTER — Telehealth: Payer: Self-pay | Admitting: Family Medicine

## 2017-01-06 NOTE — Telephone Encounter (Signed)
[facility] [place] [facility] refill request for following medications citalopram (CELEXA) 20 MG tablet 90 day supply requested    Please advise,Thanks [place]

## 2017-01-06 NOTE — Telephone Encounter (Signed)
Medication was sent on 01/04/2017.

## 2017-01-27 ENCOUNTER — Ambulatory Visit: Payer: Self-pay | Admitting: Gastroenterology

## 2017-01-31 ENCOUNTER — Telehealth: Payer: Self-pay | Admitting: Family Medicine

## 2017-01-31 NOTE — Telephone Encounter (Signed)
Please advise 

## 2017-01-31 NOTE — Telephone Encounter (Signed)
Pt states she has sinus congestion, headache and cough at night.  Pt is requesting a Rx to help this.  Pt states she can not come in to be seen today.  [facility].  GN#[phone]

## 2017-01-31 NOTE — Telephone Encounter (Signed)
Can take OTC mucinex, and nasal saline or nasal decongestant. Office is open in the morning.

## 2017-02-03 NOTE — Telephone Encounter (Signed)
Patient was advised.  

## 2017-02-06 ENCOUNTER — Encounter: Payer: Self-pay | Admitting: Family Medicine

## 2017-02-06 ENCOUNTER — Ambulatory Visit (INDEPENDENT_AMBULATORY_CARE_PROVIDER_SITE_OTHER): Payer: Medicare Other | Admitting: Family Medicine

## 2017-02-06 VITALS — BP 140/80 | HR 67 | Temp 98.4°F | Resp 15 | Wt 162.8 lb

## 2017-02-06 DIAGNOSIS — B372 Candidiasis of skin and nail: Secondary | ICD-10-CM

## 2017-02-06 MED ORDER — NYSTATIN 100000 UNIT/GM EX CREA: 1.0000 "application " | TOPICAL_CREAM | Freq: Two times a day (BID) | CUTANEOUS | 0 refills | Status: DC

## 2017-02-06 NOTE — Progress Notes (Signed)
Subjective:     Patient ID: [name], female   DOB: 1945/04/17, 71 y.o.   MRN: [id number] Chief Complaint  Patient presents with  . Rash    Patient comes in office today with concerns of a possible rash under both of her breast that has been present for the past several days. Patient reports redness and itching under her breast.    HPI States she does not usually powder under her breasts Has been using an unspecified rx cream with little improvement.  Review of Systems     Objective:   Physical Exam  Constitutional: She appears well-developed and well-nourished. No distress.  Skin:  Well demarcated inframammary rash without scaling.       Assessment:    1. Candidiasis of skin: try Nystatin mixed with hydrocortisone cream twice daily.     Plan:    Discussed powdering under her breast after showering.

## 2017-02-06 NOTE — Patient Instructions (Signed)
Use powder or corn starch under your breast after showering.

## 2017-02-25 ENCOUNTER — Telehealth: Payer: Self-pay | Admitting: Gastroenterology

## 2017-02-25 NOTE — Telephone Encounter (Signed)
Patient stated that she is having discomfort trying to pass stool. She has tried several things. At times it feels it may knock her out. Please call

## 2017-03-02 ENCOUNTER — Encounter: Payer: Self-pay | Admitting: *Deleted

## 2017-03-02 ENCOUNTER — Emergency Department
Admission: EM | Admit: 2017-03-02 | Discharge: 2017-03-02 | Disposition: A | Discharge status: Home / Self Care | Payer: Medicare Other | Attending: Emergency Medicine | Admitting: Emergency Medicine

## 2017-03-02 ENCOUNTER — Emergency Department: Payer: Medicare Other

## 2017-03-02 ENCOUNTER — Other Ambulatory Visit: Payer: Self-pay

## 2017-03-02 DIAGNOSIS — Z79899 Other long term (current) drug therapy: Secondary | ICD-10-CM | POA: Insufficient documentation

## 2017-03-02 DIAGNOSIS — Z7982 Long term (current) use of aspirin: Secondary | ICD-10-CM | POA: Insufficient documentation

## 2017-03-02 DIAGNOSIS — Z8673 Personal history of transient ischemic attack (TIA), and cerebral infarction without residual deficits: Secondary | ICD-10-CM | POA: Diagnosis not present

## 2017-03-02 DIAGNOSIS — F419 Anxiety disorder, unspecified: Secondary | ICD-10-CM | POA: Diagnosis not present

## 2017-03-02 DIAGNOSIS — I10 Essential (primary) hypertension: Secondary | ICD-10-CM | POA: Diagnosis not present

## 2017-03-02 DIAGNOSIS — F329 Major depressive disorder, single episode, unspecified: Secondary | ICD-10-CM | POA: Diagnosis not present

## 2017-03-02 DIAGNOSIS — K5900 Constipation, unspecified: Secondary | ICD-10-CM | POA: Diagnosis not present

## 2017-03-02 LAB — COMPREHENSIVE METABOLIC PANEL
ALT: 17 U/L (ref 14–54)
AST: 23 U/L (ref 15–41)
Albumin: 4.2 g/dL (ref 3.5–5.0)
Alkaline Phosphatase: 62 U/L (ref 38–126)
Anion gap: 11 (ref 5–15)
BUN: 13 mg/dL (ref 6–20)
CO2: 24 mmol/L (ref 22–32)
Calcium: 9.1 mg/dL (ref 8.9–10.3)
Chloride: 100 mmol/L — ABNORMAL LOW (ref 101–111)
Creatinine, Ser: 0.98 mg/dL (ref 0.44–1.00)
GFR calc Af Amer: >60 mL/min (ref >60)
GFR calc non Af Amer: 58 mL/min — ABNORMAL LOW (ref >60)
Glucose, Bld: 81 mg/dL (ref 65–99)
Potassium: 3 mmol/L — ABNORMAL LOW (ref 3.5–5.1)
Sodium: 135 mmol/L (ref 135–145)
Total Bilirubin: 2 mg/dL — ABNORMAL HIGH (ref 0.3–1.2)
Total Protein: 8 g/dL (ref 6.5–8.1)

## 2017-03-02 LAB — URINALYSIS, COMPLETE (UACMP) WITH MICROSCOPIC
Bacteria, UA: NONE SEEN
Bilirubin Urine: NEGATIVE
Glucose, UA: NEGATIVE mg/dL
Ketones, ur: 5 mg/dL — AB
Leukocytes, UA: NEGATIVE
Nitrite: NEGATIVE
Protein, ur: NEGATIVE mg/dL
Specific Gravity, Urine: 1.002 — ABNORMAL LOW (ref 1.005–1.030)
pH: 7 (ref 5.0–8.0)

## 2017-03-02 LAB — CBC WITH DIFFERENTIAL/PLATELET
Basophils Absolute: 0.1 10*3/uL (ref 0–0.1)
Basophils Relative: 1 %
Eosinophils Absolute: 0.1 10*3/uL (ref 0–0.7)
Eosinophils Relative: 2 %
HCT: 47.9 % — ABNORMAL HIGH (ref 35.0–47.0)
Hemoglobin: 16.2 g/dL — ABNORMAL HIGH (ref 12.0–16.0)
Lymphocytes Relative: 53 %
Lymphs Abs: 3.5 10*3/uL (ref 1.0–3.6)
MCH: 30.9 pg (ref 26.0–34.0)
MCHC: 33.8 g/dL (ref 32.0–36.0)
MCV: 91.3 fL (ref 80.0–100.0)
Monocytes Absolute: 0.5 10*3/uL (ref 0.2–0.9)
Monocytes Relative: 8 %
Neutro Abs: 2.3 10*3/uL (ref 1.4–6.5)
Neutrophils Relative %: 36 %
Platelets: 244 10*3/uL (ref 150–440)
RBC: 5.25 MIL/uL — ABNORMAL HIGH (ref 3.80–5.20)
RDW: 13.4 % (ref 11.5–14.5)
WBC: 6.5 10*3/uL (ref 3.6–11.0)

## 2017-03-02 LAB — LIPASE, BLOOD: Lipase: 29 U/L (ref 11–51)

## 2017-03-02 MED ORDER — IOPAMIDOL (ISOVUE-300) INJECTION 61%
30.0000 mL | Freq: Once | INTRAVENOUS | Status: AC | PRN
  Administered 2017-03-02: 30 mL via ORAL

## 2017-03-02 MED ORDER — MAGNESIUM CITRATE PO SOLN: 0.5000 | Freq: Once | ORAL | 1 refills | Status: AC

## 2017-03-02 MED ORDER — IOPAMIDOL (ISOVUE-300) INJECTION 61%
75.0000 mL | Freq: Once | INTRAVENOUS | Status: AC | PRN
  Administered 2017-03-02: 75 mL via INTRAVENOUS

## 2017-03-02 NOTE — ED Notes (Signed)

## 2017-03-02 NOTE — ED Triage Notes (Signed)
First Nurse Note:  Arrives with C/O constipation.  States has had colon surgery in the past and is worried that she may have a bowel blockage.  Patient is AAOx3.  Skin warm and dry.  Posture upright.  Gait steady  NAD

## 2017-03-02 NOTE — ED Provider Notes (Signed)
[facility] Emergency Department Provider Note ____________________________________________   First MD Initiated Contact with Patient 03/02/17 1407     (approximate)  I have reviewed the triage vital signs and the nursing notes.   HISTORY  Chief Complaint No chief complaint on file.    HPI [name] is a 72 y.o. female with past medical history as noted below and apparent history of partial colectomy who presents with constipation for the last 2 weeks, gradual onset, persistent course, and associated with very small and hard stool when she does have a bowel movement.  Patient states it is worsened since she has been off of Xanax in the last week.  The patient also reports a history of syncopal and near syncopal events at times when she feels like the stool is passing through her stomach.  She states that she last syncopized from the 6 months ago, but in the last several days she has had several episodes where she has felt near syncopal, with diaphoresis and lightheadedness.  She is not feeling like that currently.  She denies nausea or vomiting, and she denies any urinary symptoms or fever.  She also denies any rectal pain or pressure.   Past Medical History:  Diagnosis Date  . Anxiety   . Arthritis    lower spine  . Back pain    lower back - S/P lifting injury  . Complication of anesthesia    makes hair brittle  . GERD (gastroesophageal reflux disease)   . Hyperlipidemia   . Hypertension   . Neuromuscular disorder (HCC)    numbness legs and feet s/p lower back injury  . Stroke [facility])    "mini - strokes" 2009 - no deficit  . Vertigo    none recently  . Vitamin D deficiency   . Wears contact lenses     Patient Active Problem List   Diagnosis Date Noted  . Panic disorder 02/21/2016  . Weakness of both lower extremities 02/21/2016  . Depression 02/21/2016  . Problems with swallowing and mastication   . Stricture and stenosis of esophagus   .  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 Acid reflux 10/20/2014  . Hx of colonic polyps   . Benign neoplasm of transverse colon   . Diverticulosis of large intestine without diverticulitis   . Allergic rhinitis 08/01/2014  . Anxiety 08/01/2014  . HLD (hyperlipidemia) 08/01/2014  . BP (high blood pressure) 08/01/2014  . Vitamin D deficiency 08/01/2014  . H/O adenomatous polyp of colon 01/22/2012    Past Surgical History:  Procedure Laterality Date  . ABDOMINAL HYSTERECTOMY  1987  . CARDIAC CATHETERIZATION  02/2007  . COLON SURGERY  03/12/2012  . COLONOSCOPY WITH PROPOFOL N/A 09/26/2014   Procedure: COLONOSCOPY WITH PROPOFOL;  Surgeon: Lucilla Lame, MD;  Location: [place];  Service: Endoscopy;  Laterality: N/A;  marker (tattoo) used in colon  . ESOPHAGEAL DILATION N/A 02/02/2016   Procedure: ESOPHAGEAL DILATION;  Surgeon: Lucilla Lame, MD;  Location: [place];  Service: Endoscopy;  Laterality: N/A;  . ESOPHAGOGASTRODUODENOSCOPY (EGD) WITH PROPOFOL N/A 02/02/2016   Procedure: ESOPHAGOGASTRODUODENOSCOPY (EGD) WITH PROPOFOL;  Surgeon: Lucilla Lame, MD;  Location: [place];  Service: Endoscopy;  Laterality: N/A;  . POLYPECTOMY  09/26/2014   Procedure: POLYPECTOMY;  Surgeon: Lucilla Lame, MD;  Location: [place];  Service: Endoscopy;;  . [place]    Prior to Admission medications   Medication Sig Start Date End Date Taking? Authorizing Provider  ALPRAZolam (XANAX) 0.5 MG tablet 1/2 tablet twice times a day for one month, then 1/2 tablet every night for one month 12/23/16   Birdie Sons, MD  aspirin 81 MG tablet Take by mouth.    [provider]  citalopram (CELEXA) 20 MG tablet Take 1 tablet (20 mg total) daily by mouth. 01/04/17   Birdie Sons, MD  magnesium citrate SOLN Take 148 mLs (0.5 Bottles total) by mouth once for 1 dose. 03/02/17 03/02/17  Arta Silence, MD  nystatin cream (MYCOSTATIN) Apply 1 application topically 2  (two) times daily. Mix with equal amounts of hydrocortisone cream 02/06/17   Carmon Ginsberg, PA  omeprazole (PRILOSEC) 20 MG capsule Take 20 mg by mouth daily as needed.    [provider]  Probiotic CAPS Take by mouth.    [provider]    Allergies Levofloxacin; Calcium channel blockers; Amoxicillin; Nickel; and Penicillins  Family History  Problem Relation Age of Onset  . Diabetes Mother   . Heart disease Mother   . Hypertension Mother   . Mental illness Mother   . Cancer Father        lung cancer  . Drug abuse Brother   . Multiple sclerosis Brother     Social History Social History   Tobacco Use  . Smoking status: Never Smoker  . Smokeless tobacco: Never Used  Substance Use Topics  . Alcohol use: No    Alcohol/week: 0.0 oz  . Drug use: No    Review of Systems  Constitutional: No fever.  Eyes: No redness. ENT: No sore throat. Cardiovascular: Denies chest pain. Respiratory: Denies shortness of breath. Gastrointestinal: Positive for constipation.  Genitourinary: Negative for dysuria.  Musculoskeletal: Negative for back pain. Skin: Negative for rash. Neurological: Negative for headache.   ____________________________________________   PHYSICAL EXAM:  VITAL SIGNS: ED Triage Vitals  Enc Vitals Group     BP 03/02/17 1159 (!) 154/100     Pulse --      Resp 03/02/17 1159 18     Temp 03/02/17 1159 99 F (37.2 C)     Temp Source 03/02/17 1159 Oral     SpO2 --      Weight 03/02/17 1201 156 lb (70.8 kg)     Height 03/02/17 1201 5\' 3"  (1.6 m)     Head Circumference --      Peak Flow --      Pain Score 03/02/17 1156 7     Pain Loc --      Pain Edu? --      Excl. in [place]? --     Constitutional: Alert and oriented. Well appearing and in no acute distress. Eyes: Conjunctivae are normal.  No scleral icterus. Head: Atraumatic. Nose: No congestion/rhinnorhea. Mouth/Throat: Mucous membranes are moist.   Neck: Normal range of motion.    Cardiovascular:  Good peripheral circulation. Respiratory: Normal respiratory effort.   Gastrointestinal: Soft and nontender.  Genitourinary: No flank tenderness. Musculoskeletal: No lower extremity edema.  Extremities warm and well perfused.  Neurologic:  Normal speech and language. No gross focal neurologic deficits are appreciated.  Skin:  Skin is warm and dry. No rash noted. Psychiatric: Mood and affect are normal. Speech and behavior are normal.  ____________________________________________   LABS (all labs ordered are listed, but only abnormal results are displayed)  Labs Reviewed  COMPREHENSIVE METABOLIC PANEL  LIPASE, BLOOD  CBC WITH DIFFERENTIAL/PLATELET  URINALYSIS, COMPLETE (UACMP) WITH MICROSCOPIC   ____________________________________________  EKG  ED ECG REPORT  IArta Silence, the attending physician, personally viewed and interpreted this ECG.  Date: 03/02/2017 EKG Time: 1214 Rate: 71 Rhythm: normal sinus rhythm QRS Axis: normal Intervals: normal ST/T Wave abnormalities: Nonspecific flattened T waves anteriorly Narrative Interpretation: no evidence of acute ischemia; no significant change when compared to EKG of 03/08/2015  ____________________________________________  RADIOLOGY  Abd XR: normal amount of stool, no other acute findings Abd CT: Pending  ____________________________________________   PROCEDURES  Procedure(s) performed: No    Critical Care performed: No ____________________________________________   INITIAL IMPRESSION / ASSESSMENT AND PLAN / ED COURSE  Pertinent labs & imaging results that were available during my care of the patient were reviewed by me and considered in my medical decision making (see chart for details).  72 year old female with past medical and surgical history as noted above presents with constipation over the last 2 weeks and worse in the last week associated with intermittent abdominal cramps which  she has had previously with constipation.  The symptoms are not relieved by stool softeners at home.  Patient denies any rectal pain or pressure.  On exam, the vitals normal except for hypertension, the patient is relatively well-appearing, and abdomen is soft with no significant tenderness.  Initial abdominal x-ray shows normal stool pattern.  Differential includes simple constipation, medication related constipation, or less likely SBO, volvulus, or other intra-abdominal complication of her multiple prior surgeries.  Patient is not having any symptoms of rectal impaction.  Patient is also reported few near syncopal events when she has had an abdominal cramp although these are consistent with vasovagal episodes.  Plan: EKG, basic labs, CT abdomen and reassess.  If CT is negative likely discharge home with magnesium citrate.  ----------------------------------------- 3:47 PM on 03/02/2017 -----------------------------------------  Lab workup and CT abdomen are pending.  I am sending the patient out to the oncoming physician Dr. Kerman Passey.  If the CT shows no concerning acute findings, we will plan for discharge with prescription for magnesium citrate and follow-up with her primary care.  ____________________________________________   FINAL CLINICAL IMPRESSION(S) / ED DIAGNOSES  Final diagnoses:  Constipation, unspecified constipation type      NEW MEDICATIONS STARTED DURING THIS VISIT:  New Prescriptions   MAGNESIUM CITRATE SOLN    Take 148 mLs (0.5 Bottles total) by mouth once for 1 dose.     Note:  This document was prepared using Dragon voice recognition software and may include unintentional dictation errors.     Arta Silence, MD 03/02/17 1547

## 2017-03-02 NOTE — ED Triage Notes (Addendum)
Patient c/o constipation for two weeks. Patient states she has had small, "bits" of BM in the last two weeks, but not a normal BM. Patient denies being nausea. Patient states she has only had green beans in two days due to feeling like the food is "not going down." Patient states she feels like she has gas through out her body, abdomen, chest, arms and legs. Patient also states her PMD took her off the Xanax that she has been on for several years and hasn't had any for 7 days.Patient states she has had syncopal or near syncopal episodes in the past when she has been constipated and report two episodes in the last two weeks. Patient states she feels like her head is going to "explode from the gas in it."

## 2017-03-02 NOTE — ED Provider Notes (Signed)
-----------------------------------------   4:43 PM on 03/02/2017 -----------------------------------------  CT is resulted largely within normal limits.  We will discharge the patient with PCP follow-up.  Patient agreeable to this plan of care.  Discussed my normal abdominal pain return precautions.   Harvest Dark, MD 03/02/17 [phone]

## 2017-03-03 ENCOUNTER — Ambulatory Visit (INDEPENDENT_AMBULATORY_CARE_PROVIDER_SITE_OTHER): Payer: Medicare Other | Admitting: Family Medicine

## 2017-03-03 ENCOUNTER — Encounter: Payer: Self-pay | Admitting: Family Medicine

## 2017-03-03 VITALS — BP 126/72 | HR 78 | Temp 98.2°F | Resp 16 | Wt 160.0 lb

## 2017-03-03 DIAGNOSIS — F419 Anxiety disorder, unspecified: Secondary | ICD-10-CM

## 2017-03-03 MED ORDER — ALPRAZOLAM 0.25 MG PO TABS: ORAL_TABLET | ORAL | 3 refills | Status: DC

## 2017-03-03 NOTE — Progress Notes (Signed)
Patient: [name] Female    DOB: [birth year]   72 y.o.   MRN: [id number] Visit Date: 03/03/2017  Today's Provider: Lelon Huh, MD   Chief Complaint  Patient presents with  . Anxiety  . Depression   Subjective:    HPI  Acute anxiety From 12/23/2016-Discussed prudent weaning schedule. Advised to reduce alprazolam to 1/2 0.5mg  tablet twice times a day for one month, then 1/2 tablet every night for one month. She states she was doing well until she stopped the 1/2 nightly tablet eight days ago. Since then she has felt very anxious, is no sleeping and been constipated. She wants to get off alprazolam and requests and change to Valium.   Depression, unspecified depression type From 12/23/2016-Advised can try reducing to 1/2 tablet, but recommend she make weaning alprazolam the priority. Patient wants to see if she could change to something different.   She does see a counselor every other Wednesday and her next appointment is on 03-05-2017.    Allergies  Allergen Reactions  . Levofloxacin     Tongue and mouth swelling  . Calcium Channel Blockers     Other reaction(s): Dizziness  . Amoxicillin Other (See Comments)    Yeast infections; can take with Diflucan  . Nickel Rash  . Penicillins Other (See Comments)    Yeast infection; can take with Diflucan     Current Outpatient Medications:  .  ALPRAZolam (XANAX) 0.5 MG tablet, 1/2 tablet twice times a day for one month, then 1/2 tablet every night for one month, Disp: 3 tablet, Rfl: 0 .  aspirin 81 MG tablet, Take by mouth., Disp: , Rfl:  .  citalopram (CELEXA) 20 MG tablet, Take 1 tablet (20 mg total) daily by mouth., Disp: 90 tablet, Rfl: 4 .  nystatin cream (MYCOSTATIN), Apply 1 application topically 2 (two) times daily. Mix with equal amounts of hydrocortisone cream, Disp: 30 g, Rfl: 0 .  omeprazole (PRILOSEC) 20 MG capsule, Take 20 mg by mouth daily as needed., Disp: , Rfl:  .  Probiotic CAPS, Take by mouth., Disp:  , Rfl:   Review of Systems  Constitutional: Negative for appetite change, chills, fatigue and fever.  Respiratory: Negative for chest tightness and shortness of breath.   Cardiovascular: Negative for chest pain and palpitations.  Gastrointestinal: Negative for abdominal pain, nausea and vomiting.  Neurological: Negative for dizziness and weakness.    Social History   Tobacco Use  . Smoking status: Never Smoker  . Smokeless tobacco: Never Used  Substance Use Topics  . Alcohol use: No    Alcohol/week: 0.0 oz   Objective:   BP 126/72 (BP Location: Right Arm, Patient Position: Sitting, Cuff Size: Normal)   Pulse 78   Temp 98.2 F (36.8 C)   Resp 16   Wt 160 lb (72.6 kg)   SpO2 96%   BMI 28.34 kg/m  Vitals:   03/03/17 1451  BP: 126/72  Pulse: 78  Resp: 16  Temp: 98.2 F (36.8 C)  SpO2: 96%  Weight: 160 lb (72.6 kg)     Physical Exam  General appearance: alert, well developed, well nourished, cooperative and in no distress Head: Normocephalic, without obvious abnormality, atraumatic Respiratory: Respirations even and unlabored, normal respiratory rate Extremities: No gross deformities Skin: Skin color, texture, turgor normal. No rashes seen  Psych: Appropriate mood and affect. Neurologic: Mental status: Alert, oriented to person, place, and time, thought content appropriate.     Assessment &  Plan:     1. Anxiety She was doing well on 1/2 of 0.5mg  tablet at night. Well change to a 0.25 tablet. Can start back taking 1 full tablet at night until anxiety is under control, then reduce to 1/2 tablet at night.  - ALPRAZolam (XANAX) 0.25 MG tablet; 1/2 - 1 tablet every evening  Dispense: 30 tablet; Refill: 3       Lelon Huh, MD  [facility]

## 2017-03-05 ENCOUNTER — Telehealth: Payer: Self-pay | Admitting: Family Medicine

## 2017-03-05 NOTE — Telephone Encounter (Signed)
Patient is wanting to let you know that she had to take her ALPRAZolam (XANAX) 0.25 MG tablet during the day today.  She states that unless you tell her otherwise she will take it tonight as well.

## 2017-03-05 NOTE — Telephone Encounter (Signed)
Please review

## 2017-03-10 NOTE — Telephone Encounter (Signed)
LVM for pt to return my call.

## 2017-03-13 ENCOUNTER — Inpatient Hospital Stay
Admission: EM | Admit: 2017-03-13 | Discharge: 2017-03-16 | DRG: 390 | Disposition: A | Discharge status: Home / Self Care | Payer: Medicare Other | Attending: Internal Medicine | Admitting: Internal Medicine

## 2017-03-13 ENCOUNTER — Emergency Department: Payer: Medicare Other

## 2017-03-13 ENCOUNTER — Encounter: Payer: Self-pay | Admitting: Emergency Medicine

## 2017-03-13 ENCOUNTER — Other Ambulatory Visit: Payer: Self-pay

## 2017-03-13 ENCOUNTER — Inpatient Hospital Stay: Payer: Medicare Other

## 2017-03-13 DIAGNOSIS — Z8249 Family history of ischemic heart disease and other diseases of the circulatory system: Secondary | ICD-10-CM

## 2017-03-13 DIAGNOSIS — Z820 Family history of epilepsy and other diseases of the nervous system: Secondary | ICD-10-CM

## 2017-03-13 DIAGNOSIS — Z91048 Other nonmedicinal substance allergy status: Secondary | ICD-10-CM | POA: Diagnosis not present

## 2017-03-13 DIAGNOSIS — K56609 Unspecified intestinal obstruction, unspecified as to partial versus complete obstruction: Secondary | ICD-10-CM | POA: Diagnosis not present

## 2017-03-13 DIAGNOSIS — Z833 Family history of diabetes mellitus: Secondary | ICD-10-CM | POA: Diagnosis not present

## 2017-03-13 DIAGNOSIS — R55 Syncope and collapse: Secondary | ICD-10-CM | POA: Diagnosis present

## 2017-03-13 DIAGNOSIS — Z8673 Personal history of transient ischemic attack (TIA), and cerebral infarction without residual deficits: Secondary | ICD-10-CM

## 2017-03-13 DIAGNOSIS — F419 Anxiety disorder, unspecified: Secondary | ICD-10-CM | POA: Diagnosis present

## 2017-03-13 DIAGNOSIS — Z4682 Encounter for fitting and adjustment of non-vascular catheter: Secondary | ICD-10-CM | POA: Diagnosis not present

## 2017-03-13 DIAGNOSIS — I10 Essential (primary) hypertension: Secondary | ICD-10-CM | POA: Diagnosis not present

## 2017-03-13 DIAGNOSIS — R109 Unspecified abdominal pain: Secondary | ICD-10-CM | POA: Diagnosis not present

## 2017-03-13 DIAGNOSIS — Z801 Family history of malignant neoplasm of trachea, bronchus and lung: Secondary | ICD-10-CM | POA: Diagnosis not present

## 2017-03-13 DIAGNOSIS — Z881 Allergy status to other antibiotic agents status: Secondary | ICD-10-CM

## 2017-03-13 DIAGNOSIS — E860 Dehydration: Secondary | ICD-10-CM | POA: Diagnosis not present

## 2017-03-13 DIAGNOSIS — E785 Hyperlipidemia, unspecified: Secondary | ICD-10-CM | POA: Diagnosis present

## 2017-03-13 DIAGNOSIS — Z818 Family history of other mental and behavioral disorders: Secondary | ICD-10-CM | POA: Diagnosis not present

## 2017-03-13 DIAGNOSIS — Z973 Presence of spectacles and contact lenses: Secondary | ICD-10-CM

## 2017-03-13 DIAGNOSIS — K219 Gastro-esophageal reflux disease without esophagitis: Secondary | ICD-10-CM | POA: Diagnosis present

## 2017-03-13 DIAGNOSIS — Z90710 Acquired absence of both cervix and uterus: Secondary | ICD-10-CM | POA: Diagnosis not present

## 2017-03-13 DIAGNOSIS — Z880 Allergy status to penicillin: Secondary | ICD-10-CM

## 2017-03-13 DIAGNOSIS — Z9851 Tubal ligation status: Secondary | ICD-10-CM | POA: Diagnosis not present

## 2017-03-13 DIAGNOSIS — E876 Hypokalemia: Secondary | ICD-10-CM | POA: Diagnosis present

## 2017-03-13 DIAGNOSIS — Z7982 Long term (current) use of aspirin: Secondary | ICD-10-CM

## 2017-03-13 DIAGNOSIS — R1110 Vomiting, unspecified: Secondary | ICD-10-CM | POA: Diagnosis not present

## 2017-03-13 DIAGNOSIS — R112 Nausea with vomiting, unspecified: Secondary | ICD-10-CM | POA: Diagnosis not present

## 2017-03-13 DIAGNOSIS — Z9049 Acquired absence of other specified parts of digestive tract: Secondary | ICD-10-CM

## 2017-03-13 HISTORY — DX: Unspecified intestinal obstruction, unspecified as to partial versus complete obstruction: K56.609

## 2017-03-13 LAB — URINALYSIS, COMPLETE (UACMP) WITH MICROSCOPIC
Bacteria, UA: NONE SEEN
Bilirubin Urine: NEGATIVE
Glucose, UA: NEGATIVE mg/dL
Ketones, ur: NEGATIVE mg/dL
Leukocytes, UA: NEGATIVE
Nitrite: NEGATIVE
Protein, ur: 30 mg/dL — AB
Specific Gravity, Urine: >1.046 — ABNORMAL HIGH (ref 1.005–1.030)
pH: 6 (ref 5.0–8.0)

## 2017-03-13 LAB — CBC WITH DIFFERENTIAL/PLATELET
Basophils Absolute: 0.1 10*3/uL (ref 0–0.1)
Basophils Relative: 1 %
Eosinophils Absolute: 0.1 10*3/uL (ref 0–0.7)
Eosinophils Relative: 2 %
HCT: 46.4 % (ref 35.0–47.0)
Hemoglobin: 15.6 g/dL (ref 12.0–16.0)
Lymphocytes Relative: 23 %
Lymphs Abs: 1.7 10*3/uL (ref 1.0–3.6)
MCH: 31 pg (ref 26.0–34.0)
MCHC: 33.7 g/dL (ref 32.0–36.0)
MCV: 92 fL (ref 80.0–100.0)
Monocytes Absolute: 0.4 10*3/uL (ref 0.2–0.9)
Monocytes Relative: 5 %
Neutro Abs: 5 10*3/uL (ref 1.4–6.5)
Neutrophils Relative %: 69 %
Platelets: 228 10*3/uL (ref 150–440)
RBC: 5.04 MIL/uL (ref 3.80–5.20)
RDW: 13.5 % (ref 11.5–14.5)
WBC: 7.3 10*3/uL (ref 3.6–11.0)

## 2017-03-13 LAB — COMPREHENSIVE METABOLIC PANEL
ALT: 15 U/L (ref 14–54)
AST: 26 U/L (ref 15–41)
Albumin: 3.9 g/dL (ref 3.5–5.0)
Alkaline Phosphatase: 52 U/L (ref 38–126)
Anion gap: 8 (ref 5–15)
BUN: 14 mg/dL (ref 6–20)
CO2: 24 mmol/L (ref 22–32)
Calcium: 9.1 mg/dL (ref 8.9–10.3)
Chloride: 102 mmol/L (ref 101–111)
Creatinine, Ser: 0.8 mg/dL (ref 0.44–1.00)
GFR calc Af Amer: >60 mL/min (ref >60)
GFR calc non Af Amer: >60 mL/min (ref >60)
Glucose, Bld: 191 mg/dL — ABNORMAL HIGH (ref 65–99)
Potassium: 3.3 mmol/L — ABNORMAL LOW (ref 3.5–5.1)
Sodium: 134 mmol/L — ABNORMAL LOW (ref 135–145)
Total Bilirubin: 1 mg/dL (ref 0.3–1.2)
Total Protein: 7.4 g/dL (ref 6.5–8.1)

## 2017-03-13 LAB — TROPONIN I: Troponin I: <0.03 ng/mL (ref <0.03)

## 2017-03-13 LAB — LIPASE, BLOOD: Lipase: 23 U/L (ref 11–51)

## 2017-03-13 MED ORDER — LACTATED RINGERS IV SOLN
125.0000 mL/h | INTRAVENOUS | Status: DC
  Administered 2017-03-13 – 2017-03-14 (×2): 125 mL/h via INTRAVENOUS

## 2017-03-13 MED ORDER — ONDANSETRON 4 MG PO TBDP: 4.0000 mg | ORAL_TABLET | Freq: Four times a day (QID) | ORAL | Status: DC | PRN

## 2017-03-13 MED ORDER — HYDROMORPHONE HCL 1 MG/ML IJ SOLN
0.5000 mg | INTRAMUSCULAR | Status: DC | PRN
  Administered 2017-03-13 (×2): 0.5 mg via INTRAVENOUS
  Filled 2017-03-13: qty 1
  Filled 2017-03-13: qty 0.5

## 2017-03-13 MED ORDER — KETOROLAC TROMETHAMINE 30 MG/ML IJ SOLN
30.0000 mg | Freq: Four times a day (QID) | INTRAMUSCULAR | Status: DC | PRN
  Administered 2017-03-13 – 2017-03-14 (×3): 30 mg via INTRAVENOUS
  Filled 2017-03-13 (×3): qty 1

## 2017-03-13 MED ORDER — BENZOCAINE 20 % MT SOLN
OROMUCOSAL | Status: AC
  Filled 2017-03-13: qty 5

## 2017-03-13 MED ORDER — PANTOPRAZOLE SODIUM 40 MG IV SOLR
40.0000 mg | Freq: Every day | INTRAVENOUS | Status: DC
  Administered 2017-03-13 – 2017-03-15 (×3): 40 mg via INTRAVENOUS
  Filled 2017-03-13 (×3): qty 40

## 2017-03-13 MED ORDER — ONDANSETRON HCL 4 MG/2ML IJ SOLN
4.0000 mg | Freq: Once | INTRAMUSCULAR | Status: AC
  Administered 2017-03-13: 4 mg via INTRAVENOUS
  Filled 2017-03-13: qty 2

## 2017-03-13 MED ORDER — ENOXAPARIN SODIUM 40 MG/0.4ML ~~LOC~~ SOLN
40.0000 mg | SUBCUTANEOUS | Status: DC
  Administered 2017-03-13 – 2017-03-16 (×4): 40 mg via SUBCUTANEOUS
  Filled 2017-03-13 (×4): qty 0.4

## 2017-03-13 MED ORDER — IOPAMIDOL (ISOVUE-300) INJECTION 61%: 100.0000 mL | Freq: Once | INTRAVENOUS | Status: DC | PRN

## 2017-03-13 MED ORDER — ONDANSETRON HCL 4 MG/2ML IJ SOLN
INTRAMUSCULAR | Status: AC
  Administered 2017-03-13: 4 mg via INTRAVENOUS
  Filled 2017-03-13: qty 2

## 2017-03-13 MED ORDER — ONDANSETRON HCL 4 MG/2ML IJ SOLN
4.0000 mg | Freq: Four times a day (QID) | INTRAMUSCULAR | Status: DC | PRN
  Administered 2017-03-13 – 2017-03-14 (×2): 4 mg via INTRAVENOUS
  Filled 2017-03-13 (×2): qty 2

## 2017-03-13 MED ORDER — SODIUM CHLORIDE 0.9 % IV BOLUS (SEPSIS)
1000.0000 mL | Freq: Once | INTRAVENOUS | Status: AC
Start: 2017-03-13 — End: 2017-03-13
  Administered 2017-03-13: 1000 mL via INTRAVENOUS

## 2017-03-13 MED ORDER — ONDANSETRON HCL 4 MG/2ML IJ SOLN
4.0000 mg | Freq: Once | INTRAMUSCULAR | Status: AC
  Administered 2017-03-13: 4 mg via INTRAVENOUS

## 2017-03-13 NOTE — ED Notes (Signed)
Attempted to place NG three times. Unsuccessful. Pt refusing to let staff attempt again. Dr. Jacqualine Code aware.

## 2017-03-13 NOTE — ED Provider Notes (Signed)
[facility] Emergency Department Provider Note ____________________________________________   First MD Initiated Contact with Patient 03/13/17 [phone]     (approximate)  I have reviewed the triage vital signs and the nursing notes.   HISTORY  Chief Complaint Emesis and Abdominal Pain   HPI KAMIE KORBER is a 72 y.o. female presents for evaluation of nausea and vomiting  Patient reports for the last 2 weeks she is been experiencing multiple episodes of nausea and vomiting.  She will throw up, and then feel much better and go about her normal day.  She came here and reports that she had passed out 3 times in the day she was seen about 10 days ago, she got hydrated and went home.  She had a CT scan done.  She is called Dr. Lynnell Jude office at gastroenterology, they have not unable to set up an appointment yet.  This morning she got up normally, she reports she ate breakfast and then began feeling nauseated and very full.  She then went to use the bathroom, threw up once, sat on the stool as though she was to have a bowel movement and got very lightheaded and called 911.  She was given 4 mg Zofran, reports that she continues to feel nauseated and has thrown up a total of 3 times this morning.  After vomiting her pain gets better.  She reports that her pain is gone away.  She has had no fevers or chills.  No chest pain or trouble breathing.  She is not having any pain or discomfort right now but does continue to feel nauseated and feeling of fullness in her upper abdomen.  She reports the symptoms have come and gone for several years now since she had a bowel surgery in around 2014.  Past Medical History:  Diagnosis Date  . Anxiety   . Arthritis    lower spine  . Back pain    lower back - S/P lifting injury  . Complication of anesthesia    makes hair brittle  . GERD (gastroesophageal reflux disease)   . Hyperlipidemia   . Hypertension   . Neuromuscular disorder  (HCC)    numbness legs and feet s/p lower back injury  . Stroke [facility])    "mini - strokes" 2009 - no deficit  . Vertigo    none recently  . Vitamin D deficiency   . Wears contact lenses     Patient Active Problem List   Diagnosis Date Noted  . Panic disorder 02/21/2016  . Weakness of both lower extremities 02/21/2016  . Depression 02/21/2016  . Problems with swallowing and mastication   . Stricture and stenosis of esophagus   . 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 Acid reflux 10/20/2014  . Hx of colonic polyps   . Benign neoplasm of transverse colon   . Diverticulosis of large intestine without diverticulitis   . Allergic rhinitis 08/01/2014  . Anxiety 08/01/2014  . HLD (hyperlipidemia) 08/01/2014  . BP (high blood pressure) 08/01/2014  . Vitamin D deficiency 08/01/2014  . H/O adenomatous polyp of colon 01/22/2012    Past Surgical History:  Procedure Laterality Date  . ABDOMINAL HYSTERECTOMY  1987  . CARDIAC CATHETERIZATION  02/2007  . COLON SURGERY  03/12/2012  . COLONOSCOPY WITH PROPOFOL N/A 09/26/2014   Procedure: COLONOSCOPY WITH PROPOFOL;  Surgeon: Lucilla Lame, MD;  Location: [place];  Service: Endoscopy;  Laterality: N/A;  marker (tattoo) used in colon  . ESOPHAGEAL DILATION N/A 02/02/2016  Procedure: ESOPHAGEAL DILATION;  Surgeon: Lucilla Lame, MD;  Location: [place];  Service: Endoscopy;  Laterality: N/A;  . ESOPHAGOGASTRODUODENOSCOPY (EGD) WITH PROPOFOL N/A 02/02/2016   Procedure: ESOPHAGOGASTRODUODENOSCOPY (EGD) WITH PROPOFOL;  Surgeon: Lucilla Lame, MD;  Location: [place];  Service: Endoscopy;  Laterality: N/A;  . POLYPECTOMY  09/26/2014   Procedure: POLYPECTOMY;  Surgeon: Lucilla Lame, MD;  Location: [place];  Service: Endoscopy;;  . [place]    Prior to Admission medications   Medication Sig Start Date End Date Taking? Authorizing Provider  ALPRAZolam Duanne Moron) 0.25 MG tablet 1/2 - 1 tablet every evening 03/03/17   Yes Birdie Sons, MD  aspirin 81 MG tablet Take 81 mg by mouth daily.    Yes [provider]  citalopram (CELEXA) 20 MG tablet Take 1 tablet (20 mg total) daily by mouth. Patient taking differently: Take 10 mg by mouth daily.  01/04/17  Yes Birdie Sons, MD  nystatin cream (MYCOSTATIN) Apply 1 application topically 2 (two) times daily. Mix with equal amounts of hydrocortisone cream 02/06/17  Yes Carmon Ginsberg, PA  omeprazole (PRILOSEC) 20 MG capsule Take 20 mg by mouth daily as needed.   Yes [provider]  Probiotic CAPS Take by mouth.   Yes [provider]    Allergies Levofloxacin; Calcium channel blockers; Amoxicillin; Nickel; and Penicillins  Family History  Problem Relation Age of Onset  . Diabetes Mother   . Heart disease Mother   . Hypertension Mother   . Mental illness Mother   . Cancer Father        lung cancer  . Drug abuse Brother   . Multiple sclerosis Brother     Social History Social History   Tobacco Use  . Smoking status: Never Smoker  . Smokeless tobacco: Never Used  Substance Use Topics  . Alcohol use: No    Alcohol/week: 0.0 oz  . Drug use: No    Review of Systems Constitutional: No fever/chills Eyes: No visual changes. ENT: No sore throat. Cardiovascular: Denies chest pain.  No palpitations. Respiratory: Denies shortness of breath. Gastrointestinal: No abdominal pain describes a very full feeling in her upper abdomen after eating.  No diarrhea.  No constipation. Genitourinary: Negative for dysuria. Musculoskeletal: Negative for back pain. Skin: Negative for rash. Neurological: Negative for headaches, focal weakness or numbness.    ____________________________________________   PHYSICAL EXAM:  VITAL SIGNS: ED Triage Vitals  Enc Vitals Group     BP 03/13/17 0814 (!) 146/82     Pulse Rate 03/13/17 0814 74     Resp 03/13/17 0814 16     Temp 03/13/17 0814 (!) 97.5 F (36.4 C)     Temp Source 03/13/17  0814 Oral     SpO2 03/13/17 0814 100 %     Weight --      Height --      Head Circumference --      Peak Flow --      Pain Score 03/13/17 0815 0     Pain Loc --      Pain Edu? --      Excl. in [place]? --     Constitutional: Alert and oriented. Well appearing and in no acute distress holding an emesis basin the porch and appears nauseated. Eyes: Conjunctivae are normal. Head: Atraumatic. Nose: No congestion/rhinnorhea. Mouth/Throat: Mucous membranes are moist. Neck: No stridor.   Cardiovascular: Normal rate, regular rhythm. Grossly normal heart sounds.  Good peripheral circulation. Respiratory:  Normal respiratory effort.  No retractions. Lungs CTAB. Gastrointestinal: Soft and nontender. No distention.  No discomfort or distention.  Normal bowel sounds.  Soft nontender in all quadrants.  No rebound or guarding. Musculoskeletal: No lower extremity tenderness nor edema. Neurologic:  Normal speech and language. No gross focal neurologic deficits are appreciated.  Skin:  Skin is warm, dry and intact. No rash noted. Psychiatric: Mood and affect are normal. Speech and behavior are normal.  ____________________________________________   LABS (all labs ordered are listed, but only abnormal results are displayed)  Labs Reviewed  COMPREHENSIVE METABOLIC PANEL - Abnormal; Notable for the following components:      Result Value   Sodium 134 (*)    Potassium 3.3 (*)    Glucose, Bld 191 (*)    All other components within normal limits  CBC WITH DIFFERENTIAL/PLATELET  LIPASE, BLOOD  TROPONIN I  URINALYSIS, COMPLETE (UACMP) WITH MICROSCOPIC   ____________________________________________  EKG  Reviewed enterotomy at 1400 Heart rate 60 QRS 80 QTC 420 Normal sinus rhythm, nonspecific T wave abnormality ____________________________________________  RADIOLOGY  Dg Abdomen 1 View  Result Date: 03/13/2017 CLINICAL DATA:  Nasogastric tube placement. EXAM: ABDOMEN - 1 VIEW COMPARISON:  CT  abdomen and pelvis March 13, 2017 FINDINGS: No nasogastric tube is evident on this study. Loops of bowel remain dilated. No free air. Surgical clips noted in right upper quadrant. Lung bases clear. IMPRESSION: No nasogastric tube evident in the lower chest or abdominal regions. Persistent bowel dilatation. No free air. Electronically Signed   By: Lowella Grip III M.D.   On: 03/13/2017 15:10   Ct Abdomen Pelvis W Contrast  Result Date: 03/13/2017 CLINICAL DATA:  Abdominal pain and vomiting EXAM: CT ABDOMEN AND PELVIS WITH CONTRAST TECHNIQUE: Multidetector CT imaging of the abdomen and pelvis was performed using the standard protocol following bolus administration of intravenous contrast. CONTRAST:  100 mL Isovue-300 nonionic COMPARISON:  CT abdomen and pelvis June 05, 2014 and March 02, 2017 FINDINGS: Lower chest: There is bibasilar atelectasis. No lung base edema or consolidation. Hepatobiliary: No focal liver lesions are apparent. Gallbladder is absent. There is no biliary duct dilatation. Pancreas: No pancreatic mass or inflammatory focus. Spleen: There are areas of decreased attenuation in the spleen which over time show enhancement consistent with splenic hemangiomas, stable. The larger of these hemangiomas is more posteriorly located measuring 2.0 x 1.8 cm. A smaller hemangioma is more anterior in location measuring 1.1 x 1.0 cm. No new splenic lesions are evident. Adrenals/Urinary Tract: Adrenals bilaterally appear normal. Kidneys bilaterally show no evident mass or hydronephrosis on either side. There is no appreciable renal or ureteral calculus on either side. Urinary bladder is midline with wall thickness within normal limits. Stomach/Bowel: There is evidence of previous anastomosis in the transverse colon which appears patent. There are multiple loops of jejunum which are fluid-filled and dilated. There is a transition zone in the distal jejunal region consistent with a degree of bowel  obstruction. There is no free air or portal venous air. There are multiple diverticula throughout the sigmoid and descending colon without diverticulitis. Vascular/Lymphatic: There is atherosclerotic calcification in the aorta and right common iliac artery. There is no demonstrable aneurysm. Major mesenteric vessels appear patent. No adenopathy is evident in the abdomen or pelvis. Reproductive: Uterus is absent.  No evident pelvic mass. Other: There is no periappendiceal region inflammation. There is moderate ascites in the pelvis. No abscess evident in the abdomen or pelvis. Musculoskeletal: Degenerative changes noted in the lower  thoracic and lumbar spine. There are no evident blastic or lytic bone lesions. There is no intramuscular or abdominal wall lesion. IMPRESSION: 1. Small bowel obstruction with transition zone in the lower abdomen in the distal jejunal region. No free air. 2.   Previous transverse colonic anastomosis patent. 3.   Moderate ascites in the pelvis. 4.  No appreciable abscess in the abdomen or pelvis. 5.  No renal or ureteral calculus.  No hydronephrosis. 6.  Uterus and gallbladder absent. 7.  Aortoiliac atherosclerosis. Aortic Atherosclerosis (ICD10-I70.0). Electronically Signed   By: Lowella Grip III M.D.   On: 03/13/2017 10:47   Dg Abd 2 Views  Result Date: 03/13/2017 CLINICAL DATA:  Nausea and vomiting. EXAM: ABDOMEN - 2 VIEW COMPARISON:  CT abdomen and pelvis March 02, 2017 FINDINGS: There is a loop of borderline dilated small bowel in the right mid abdomen. No other bowel dilatation. However, there are multiple air-fluid levels throughout the abdomen and upper pelvis. No free air. Lung bases are clear. There are small phleboliths in the pelvis. Surgical clips are noted in the mid abdomen on the left. IMPRESSION: Single loop of borderline dilated small bowel with multiple air-fluid levels. The appearance is more suggestive of enteritis or early ileus than bowel obstruction,  although early bowel obstruction is a differential consideration. Postoperative change noted in left abdomen. No free air. Lung bases clear. Electronically Signed   By: Lowella Grip III M.D.   On: 03/13/2017 09:03    CT result reviewed, concern for small bowel obstruction. ____________________________________________   PROCEDURES  Procedure(s) performed: None  Procedures  Critical Care performed: No  ____________________________________________   INITIAL IMPRESSION / ASSESSMENT AND PLAN / ED COURSE  Pertinent labs & imaging results that were available during my care of the patient were reviewed by me and considered in my medical decision making (see chart for details).  Differential diagnosis includes but is not limited to, abdominal perforation, aortic dissection, cholecystitis, appendicitis, diverticulitis, colitis, esophagitis/gastritis, kidney stone, pyelonephritis, urinary tract infection, aortic aneurysm. All are considered in decision and treatment plan. Based upon the patient's presentation and risk factors, seems that she is re-presenting with similar symptoms.  She had a CT scan less than 2 weeks ago for similar symptomatology that did not reveal an acute obstruction or other acute pathology.  She reports that the symptoms seem to be intermittent worse in the morning after eating.  Present she denies any pain, but has ongoing nausea.  Plan to hydrate her, provide antiemetics, check labs including LFTs and lipase, and given she had a recent CT less than 2 weeks ago obtain a abdominal x-ray to evaluate for any evidence of obstruction is my pretest probability is low given the recurrent nature of this illness.  She also reports that she had recent near syncopal episode today, and then also had syncope a few times on her previous presentation.  She is awake alert and neurologically intact.  Denies any injury, no head trauma.  No indication for CT of the head.  Plan to hydrate her  provide antiemetics and reassess as well as review labs.     Clinical Course as of Mar 13 1344  Thu Mar 13, 2017  1235 Dr. Hampton Abbot coming to see patient.  Return call after he is now out of the OR.  Patient resting comfortably, aware and agreeable to general surgery consult.  She currently reports no ongoing pain nausea or vomiting.  She does appear improved, given her CT findings requested surgery  consult for further treatment recommendations  [MQ]    Clinical Course User Index [MQ] Delman Kitten, MD   Patient admitted to Dr. Hampton Abbot service, general surgery.  The patient's OG tube which was requested by general surgery was not able to be placed as the patient did not tolerate procedure.  Discussed with Dr. Roderick Pee and we will stop attempts at placing an OG, he advises place patient on strict n.p.o. for the floor.  He will follow-up.  ____________________________________________   FINAL CLINICAL IMPRESSION(S) / ED DIAGNOSES  Final diagnoses:  SBO (small bowel obstruction) (HCC)      NEW MEDICATIONS STARTED DURING THIS VISIT:  New Prescriptions   No medications on file     Note:  This document was prepared using Dragon voice recognition software and may include unintentional dictation errors.     Delman Kitten, MD 03/13/17 [phone]

## 2017-03-13 NOTE — H&P (Signed)
Date of Admission:  03/13/2017  Reason for Admission: Small bowel obstruction  History of Present Illness: [name] is a 72 y.o. female who presents with a 1 day history of nausea vomiting started overnight.  Patient reports that she had transverse colon resection surgery 4 years ago and since then she has had episodes of intermittent nausea after eating that are associated with dizziness.  She has had episodes over the years of presyncope or syncope.  Sometimes that she has nausea, after having a bowel movement this improves on its own.    She had been otherwise doing okay until the last couple of days when she has been noticing some more nausea or discomfort after eating.  Then overnight she started having some nausea symptoms.  She tried to go to the bathroom thinking she had to have a bowel movement, but then ended up having an episode of emesis along with syncope in the bathroom.  She was brought to the emergency room for further evaluation.  Given her surgical history, a CT scan of the abdomen and pelvis was obtained which was concerning for small bowel obstruction with transition zone in the lower abdomen.  Of note, she was here about 10 days ago due to constipation.  The patient reports that she had had a bowel movement in many days.  CT scan had been done which was normal at that time.  Past Medical History: Past Medical History:  Diagnosis Date  . Anxiety   . Arthritis    lower spine  . Back pain    lower back - S/P lifting injury  . Complication of anesthesia    makes hair brittle  . GERD (gastroesophageal reflux disease)   . Hyperlipidemia   . Hypertension   . Neuromuscular disorder (HCC)    numbness legs and feet s/p lower back injury  . Stroke [facility])    "mini - strokes" 2009 - no deficit  . Vertigo    none recently  . Vitamin D deficiency   . Wears contact lenses      Past Surgical History: Past Surgical History:  Procedure Laterality Date  . ABDOMINAL  HYSTERECTOMY  1987  . CARDIAC CATHETERIZATION  02/2007  . COLON SURGERY  03/12/2012  . COLONOSCOPY WITH PROPOFOL N/A 09/26/2014   Procedure: COLONOSCOPY WITH PROPOFOL;  Surgeon: Lucilla Lame, MD;  Location: [place];  Service: Endoscopy;  Laterality: N/A;  marker (tattoo) used in colon  . ESOPHAGEAL DILATION N/A 02/02/2016   Procedure: ESOPHAGEAL DILATION;  Surgeon: Lucilla Lame, MD;  Location: [place];  Service: Endoscopy;  Laterality: N/A;  . ESOPHAGOGASTRODUODENOSCOPY (EGD) WITH PROPOFOL N/A 02/02/2016   Procedure: ESOPHAGOGASTRODUODENOSCOPY (EGD) WITH PROPOFOL;  Surgeon: Lucilla Lame, MD;  Location: [place];  Service: Endoscopy;  Laterality: N/A;  . POLYPECTOMY  09/26/2014   Procedure: POLYPECTOMY;  Surgeon: Lucilla Lame, MD;  Location: [place];  Service: Endoscopy;;  . TUBAL LIGATION  1972    Home Medications: Prior to Admission medications   Medication Sig Start Date End Date Taking? Authorizing Provider  ALPRAZolam Duanne Moron) 0.25 MG tablet 1/2 - 1 tablet every evening 03/03/17  Yes Birdie Sons, MD  aspirin 81 MG tablet Take 81 mg by mouth daily.    Yes [provider]  citalopram (CELEXA) 20 MG tablet Take 1 tablet (20 mg total) daily by mouth. Patient taking differently: Take 10 mg by mouth daily.  01/04/17  Yes Birdie Sons, MD  nystatin cream (MYCOSTATIN) Apply  1 application topically 2 (two) times daily. Mix with equal amounts of hydrocortisone cream 02/06/17  Yes Carmon Ginsberg, PA  omeprazole (PRILOSEC) 20 MG capsule Take 20 mg by mouth daily as needed.   Yes [provider]  Probiotic CAPS Take by mouth.   Yes [provider]    Allergies: Allergies  Allergen Reactions  . Levofloxacin     Tongue and mouth swelling  . Calcium Channel Blockers     Other reaction(s): Dizziness  . Amoxicillin Other (See Comments)    Has patient had a PCN reaction causing immediate rash, facial/tongue/throat swelling,  SOB or lightheadedness with hypotension: No Has patient had a PCN reaction causing severe rash involving mucus membranes or skin necrosis: No Has patient had a PCN reaction that required hospitalization: No Has patient had a PCN reaction occurring within the last 10 years: No If all of the above answers are "NO", then may proceed with Cephalosporin use.   . Nickel Rash  . Penicillins Other (See Comments)    Has patient had a PCN reaction causing immediate rash, facial/tongue/throat swelling, SOB or lightheadedness with hypotension: No Has patient had a PCN reaction causing severe rash involving mucus membranes or skin necrosis: No Has patient had a PCN reaction that required hospitalization: No Has patient had a PCN reaction occurring within the last 10 years: No If all of the above answers are "NO", then may proceed with Cephalosporin use.    Social History:  reports that  has never smoked. she has never used smokeless tobacco. She reports that she does not drink alcohol or use drugs.   Family History: Family History  Problem Relation Age of Onset  . Diabetes Mother   . Heart disease Mother   . Hypertension Mother   . Mental illness Mother   . Cancer Father        lung cancer  . Drug abuse Brother   . Multiple sclerosis Brother     Review of Systems: Review of Systems  Constitutional: Negative for chills and fever.  HENT: Negative for hearing loss.   Respiratory: Negative for shortness of breath.   Cardiovascular: Negative for chest pain.  Gastrointestinal: Positive for abdominal pain, constipation, nausea and vomiting. Negative for diarrhea.  Genitourinary: Negative for dysuria.  Musculoskeletal: Negative for myalgias.  Skin: Negative for rash.  Neurological: Positive for loss of consciousness.  Psychiatric/Behavioral: Negative for depression.  All other systems reviewed and are negative.   Physical Exam BP (!) 142/84   Pulse 71   Temp (!) 97.5 F (36.4 C) (Oral)    Resp 16   SpO2 100%  CONSTITUTIONAL: No acute distress HEENT:  Normocephalic, atraumatic, extraocular motion intact. NECK: Trachea is midline, and there is no jugular venous distension.  RESPIRATORY:  Lungs are clear, and breath sounds are equal bilaterally. Normal respiratory effort without pathologic use of accessory muscles. CARDIOVASCULAR: Heart is regular without murmurs, gallops, or rubs. GI: The abdomen is soft, distended, with mild discomfort in the mid abdomen. There were no palpable masses.  MUSCULOSKELETAL:  Normal muscle strength and tone in all four extremities.  No peripheral edema or cyanosis. SKIN: Skin turgor is normal. There are no pathologic skin lesions.  NEUROLOGIC:  Motor and sensation is grossly normal.  Cranial nerves are grossly intact. PSYCH:  Alert and oriented to person, place and time. Affect is normal.  Laboratory Analysis: Results for orders placed or performed during the hospital encounter of 03/13/17 (from the past 24 hour(s))  CBC with  Differential     Status: None   Collection Time: 03/13/17  8:25 AM  Result Value Ref Range   WBC 7.3 3.6 - 11.0 K/uL   RBC 5.04 3.80 - 5.20 MIL/uL   Hemoglobin 15.6 12.0 - 16.0 g/dL   HCT 46.4 35.0 - 47.0 %   MCV 92.0 80.0 - 100.0 fL   MCH 31.0 26.0 - 34.0 pg   MCHC 33.7 32.0 - 36.0 g/dL   RDW 13.5 11.5 - 14.5 %   Platelets 228 150 - 440 K/uL   Neutrophils Relative % 69 %   Neutro Abs 5.0 1.4 - 6.5 K/uL   Lymphocytes Relative 23 %   Lymphs Abs 1.7 1.0 - 3.6 K/uL   Monocytes Relative 5 %   Monocytes Absolute 0.4 0.2 - 0.9 K/uL   Eosinophils Relative 2 %   Eosinophils Absolute 0.1 0 - 0.7 K/uL   Basophils Relative 1 %   Basophils Absolute 0.1 0 - 0.1 K/uL  Comprehensive metabolic panel     Status: Abnormal   Collection Time: 03/13/17  8:25 AM  Result Value Ref Range   Sodium 134 (L) 135 - 145 mmol/L   Potassium 3.3 (L) 3.5 - 5.1 mmol/L   Chloride 102 101 - 111 mmol/L   CO2 24 22 - 32 mmol/L   Glucose, Bld  191 (H) 65 - 99 mg/dL   BUN 14 6 - 20 mg/dL   Creatinine, Ser 0.80 0.44 - 1.00 mg/dL   Calcium 9.1 8.9 - 10.3 mg/dL   Total Protein 7.4 6.5 - 8.1 g/dL   Albumin 3.9 3.5 - 5.0 g/dL   AST 26 15 - 41 U/L   ALT 15 14 - 54 U/L   Alkaline Phosphatase 52 38 - 126 U/L   Total Bilirubin 1.0 0.3 - 1.2 mg/dL   GFR calc non Af Amer >60 >60 mL/min   GFR calc Af Amer >60 >60 mL/min   Anion gap 8 5 - 15  Lipase, blood     Status: None   Collection Time: 03/13/17  8:25 AM  Result Value Ref Range   Lipase 23 11 - 51 U/L  Troponin I     Status: None   Collection Time: 03/13/17  8:25 AM  Result Value Ref Range   Troponin I <0.03 <0.03 ng/mL    Imaging: Ct Abdomen Pelvis W Contrast  Result Date: 03/13/2017 CLINICAL DATA:  Abdominal pain and vomiting EXAM: CT ABDOMEN AND PELVIS WITH CONTRAST TECHNIQUE: Multidetector CT imaging of the abdomen and pelvis was performed using the standard protocol following bolus administration of intravenous contrast. CONTRAST:  100 mL Isovue-300 nonionic COMPARISON:  CT abdomen and pelvis June 05, 2014 and March 02, 2017 FINDINGS: Lower chest: There is bibasilar atelectasis. No lung base edema or consolidation. Hepatobiliary: No focal liver lesions are apparent. Gallbladder is absent. There is no biliary duct dilatation. Pancreas: No pancreatic mass or inflammatory focus. Spleen: There are areas of decreased attenuation in the spleen which over time show enhancement consistent with splenic hemangiomas, stable. The larger of these hemangiomas is more posteriorly located measuring 2.0 x 1.8 cm. A smaller hemangioma is more anterior in location measuring 1.1 x 1.0 cm. No new splenic lesions are evident. Adrenals/Urinary Tract: Adrenals bilaterally appear normal. Kidneys bilaterally show no evident mass or hydronephrosis on either side. There is no appreciable renal or ureteral calculus on either side. Urinary bladder is midline with wall thickness within normal limits.  Stomach/Bowel: There is evidence of previous anastomosis  in the transverse colon which appears patent. There are multiple loops of jejunum which are fluid-filled and dilated. There is a transition zone in the distal jejunal region consistent with a degree of bowel obstruction. There is no free air or portal venous air. There are multiple diverticula throughout the sigmoid and descending colon without diverticulitis. Vascular/Lymphatic: There is atherosclerotic calcification in the aorta and right common iliac artery. There is no demonstrable aneurysm. Major mesenteric vessels appear patent. No adenopathy is evident in the abdomen or pelvis. Reproductive: Uterus is absent.  No evident pelvic mass. Other: There is no periappendiceal region inflammation. There is moderate ascites in the pelvis. No abscess evident in the abdomen or pelvis. Musculoskeletal: Degenerative changes noted in the lower thoracic and lumbar spine. There are no evident blastic or lytic bone lesions. There is no intramuscular or abdominal wall lesion. IMPRESSION: 1. Small bowel obstruction with transition zone in the lower abdomen in the distal jejunal region. No free air. 2.   Previous transverse colonic anastomosis patent. 3.   Moderate ascites in the pelvis. 4.  No appreciable abscess in the abdomen or pelvis. 5.  No renal or ureteral calculus.  No hydronephrosis. 6.  Uterus and gallbladder absent. 7.  Aortoiliac atherosclerosis. Aortic Atherosclerosis (ICD10-I70.0). Electronically Signed   By: Lowella Grip III M.D.   On: 03/13/2017 10:47   Dg Abd 2 Views  Result Date: 03/13/2017 CLINICAL DATA:  Nausea and vomiting. EXAM: ABDOMEN - 2 VIEW COMPARISON:  CT abdomen and pelvis March 02, 2017 FINDINGS: There is a loop of borderline dilated small bowel in the right mid abdomen. No other bowel dilatation. However, there are multiple air-fluid levels throughout the abdomen and upper pelvis. No free air. Lung bases are clear. There are small  phleboliths in the pelvis. Surgical clips are noted in the mid abdomen on the left. IMPRESSION: Single loop of borderline dilated small bowel with multiple air-fluid levels. The appearance is more suggestive of enteritis or early ileus than bowel obstruction, although early bowel obstruction is a differential consideration. Postoperative change noted in left abdomen. No free air. Lung bases clear. Electronically Signed   By: Lowella Grip III M.D.   On: 03/13/2017 09:03    Assessment and Plan: This is a 72 y.o. female who presents with nausea and vomiting episode associated with syncope and small bowel obstruction.  Discussed with the patient that given the small bowel obstruction on CT scan, would admit the patient for management.  Discussed the role for conservative management with n.p.o. diet, NG tube placement, IV fluid hydration.  The patient does understand that if there is no improvement over the next 48-72 hours, she may require surgery to relieve the small bowel obstruction.  Likely this is related to scarring or adhesions from her prior surgeries.  However also discussed with the patient that is very atypical for patients to come with syncope episode in the setting of small bowel obstruction.  In light of that, we will also place consult to hospitalist for further evaluation.  She may need further cardiac workup versus ultrasound of carotids or her heart.  Will defer to the hospitalist for this.  She understands this plan and all of her questions have been answered   Melvyn Neth, [place]

## 2017-03-13 NOTE — ED Notes (Signed)
Patient transported to CT 

## 2017-03-13 NOTE — Consult Note (Signed)
Patient Demographics  [name], is a 72 y.o. female   MRN: [id number]   DOB - [birth year]  Admit Date - 03/13/2017    Outpatient Primary MD for the patient is [name] [name], MD  Consult requested in the [facility] by Olean Ree, MD, On 03/13/2017    Reason for consult ; management of medical problems   HPI: 22 x 72 year old female patient admitted to surgical service for small bowel obstruction, we asked for medical management of her medical problems including anxiety.  Patient has been having abdominal pain getting worse for the last 2 days associated with nausea, vomiting overnight.  Patient had colon surgery 4 years ago.  Patient tried to go to the bathroom thinking she had to have a BM but then ended up having episode of emesis followed by syncope in the bathroom.  CT abdomen showed small bowel obstruction with transition zone in the lower abdomen.  Patient has anxiety, followed PCP Dr. [name], patient is on weaning schedule with Xanax. Past Medical History:  Diagnosis Date  . Anxiety   . Arthritis    lower spine  . Back pain    lower back - S/P lifting injury  . Complication of anesthesia    makes hair brittle  . GERD (gastroesophageal reflux disease)   . Hyperlipidemia   . Hypertension   . Neuromuscular disorder (HCC)    numbness legs and feet s/p lower back injury  . Stroke [facility])    "mini - strokes" 2009 - no deficit  . Vertigo    none recently  . Vitamin D deficiency   . Wears contact lenses       Past Surgical History:  Procedure Laterality Date  . ABDOMINAL HYSTERECTOMY  1987  . CARDIAC CATHETERIZATION  02/2007  . COLON SURGERY  03/12/2012  . COLONOSCOPY WITH PROPOFOL N/A 09/26/2014   Procedure: COLONOSCOPY WITH PROPOFOL;  Surgeon: Lucilla Lame, MD;  Location: [place];  Service:  Endoscopy;  Laterality: N/A;  marker (tattoo) used in colon  . ESOPHAGEAL DILATION N/A 02/02/2016   Procedure: ESOPHAGEAL DILATION;  Surgeon: Lucilla Lame, MD;  Location: [place];  Service: Endoscopy;  Laterality: N/A;  . ESOPHAGOGASTRODUODENOSCOPY (EGD) WITH PROPOFOL N/A 02/02/2016   Procedure: ESOPHAGOGASTRODUODENOSCOPY (EGD) WITH PROPOFOL;  Surgeon: Lucilla Lame, MD;  Location: [place];  Service: Endoscopy;  Laterality: N/A;  . POLYPECTOMY  09/26/2014   Procedure: POLYPECTOMY;  Surgeon: Lucilla Lame, MD;  Location: [place];  Service: Endoscopy;;  . [place]      Chief Complaint  Patient presents with  . Emesis  . Abdominal Pain        Review of Systems    In addition to the HPI above,  No Fever-chills, No Headache, No changes with Vision or hearing, No problems swallowing food or Liquids, No Chest pain, Cough or Shortness of Breath, Abdominal  pain, nausea, vomiting, constipation  no Blood in stool or Urine, No dysuria, No new skin rashes or bruises,  No new joints pains-aches,  No new weakness, tingling, numbness in any extremity, No recent weight gain or loss, No polyuria, polydypsia or polyphagia, No significant Mental Stressors.  A full 10 point Review of Systems was done, except as stated above, all other Review of Systems were negative.   Social History Social History   Tobacco Use  . Smoking status: Never Smoker  . Smokeless tobacco: Never Used  Substance Use Topics  . Alcohol use: No    Alcohol/week: 0.0 oz     Family History Family History  Problem Relation Age of Onset  . Diabetes Mother   . Heart disease Mother   . Hypertension Mother   . Mental illness Mother   . Cancer Father        lung cancer  . Drug abuse Brother   . Multiple sclerosis Brother      Prior to Admission medications   Medication Sig Start Date End Date Taking? Authorizing Provider  ALPRAZolam Duanne Moron) 0.25 MG tablet 1/2 - 1  tablet every evening 03/03/17  Yes Birdie Sons, MD  aspirin 81 MG tablet Take 81 mg by mouth daily.    Yes [provider]  citalopram (CELEXA) 20 MG tablet Take 1 tablet (20 mg total) daily by mouth. Patient taking differently: Take 10 mg by mouth daily.  01/04/17  Yes Birdie Sons, MD  nystatin cream (MYCOSTATIN) Apply 1 application topically 2 (two) times daily. Mix with equal amounts of hydrocortisone cream 02/06/17  Yes Carmon Ginsberg, PA  omeprazole (PRILOSEC) 20 MG capsule Take 20 mg by mouth daily as needed.   Yes [provider]  Probiotic CAPS Take by mouth.   Yes [provider]    Anti-infectives (From admission, onward)   None      Scheduled Meds: . benzocaine      . enoxaparin (LOVENOX) injection  40 mg Subcutaneous Q24H  . pantoprazole (PROTONIX) IV  40 mg Intravenous QHS   Continuous Infusions: . lactated ringers 125 mL/hr (03/13/17 1428)   PRN Meds:.HYDROmorphone (DILAUDID) injection, iopamidol, ketorolac, ondansetron **OR** ondansetron (ZOFRAN) IV  Allergies  Allergen Reactions  . Levofloxacin     Tongue and mouth swelling  . Calcium Channel Blockers     Other reaction(s): Dizziness  . Amoxicillin Other (See Comments)    Has patient had a PCN reaction causing immediate rash, facial/tongue/throat swelling, SOB or lightheadedness with hypotension: No Has patient had a PCN reaction causing severe rash involving mucus membranes or skin necrosis: No Has patient had a PCN reaction that required hospitalization: No Has patient had a PCN reaction occurring within the last 10 years: No If all of the above answers are "NO", then may proceed with Cephalosporin use.   . Nickel Rash  . Penicillins Other (See Comments)    Has patient had a PCN reaction causing immediate rash, facial/tongue/throat swelling, SOB or lightheadedness with hypotension: No Has patient had a PCN reaction causing severe rash involving mucus membranes or skin  necrosis: No Has patient had a PCN reaction that required hospitalization: No Has patient had a PCN reaction occurring within the last 10 years: No If all of the above answers are "NO", then may proceed with Cephalosporin use.    Physical Exam  Vitals  Blood pressure (!) 152/77, pulse 62, temperature (!) 97.5 F (36.4 C), temperature source Oral, resp. rate 20, SpO2 100 %.   1. General ', awake, oriented, NG tube inserted by ER nurse.   2. Normal affect and  insight, Not Suicidal or Homicidal,   3. No F.N deficits, ALL C.Nerves Intact, Strength 5/5 all 4 extremities, Sensation intact all 4 extremities, Plantars down going.  4. Ears and Eyes appear Normal, Conjunctivae clear, PERRLA. Moist Oral Mucosa.  5. Supple Neck, No JVD, No cervical lymphadenopathy appriciated, No Carotid Bruits.  6. Symmetrical Chest wall movement, Good air movement bilaterally, CTAB.  7. RRR, No Gallops, Rubs or Murmurs, No Parasternal Heave.  8.  Distended, no organomegaly,BS present.   9.  No Cyanosis, Normal Skin Turgor, No Skin Rash or Bruise.  10. Good muscle tone,  joints appear normal , no effusions, Normal ROM.  11. No Palpable Lymph Nodes in Neck or Axillae    Data Review  CBC Recent Labs  Lab 03/13/17 0825  WBC 7.3  HGB 15.6  HCT 46.4  PLT 228  MCV 92.0  MCH 31.0  MCHC 33.7  RDW 13.5  LYMPHSABS 1.7  MONOABS 0.4  EOSABS 0.1  BASOSABS 0.1   ------------------------------------------------------------------------------------------------------------------  Chemistries  Recent Labs  Lab 03/13/17 0825  NA 134*  K 3.3*  CL 102  CO2 24  GLUCOSE 191*  BUN 14  CREATININE 0.80  CALCIUM 9.1  AST 26  ALT 15  ALKPHOS 52  BILITOT 1.0   ------------------------------------------------------------------------------------------------------------------ estimated creatinine clearance is 63.4 mL/min (by C-G formula based on SCr of 0.8  mg/dL). ------------------------------------------------------------------------------------------------------------------ No results for input(s): TSH, T4TOTAL, T3FREE, THYROIDAB in the last 72 hours.  Invalid input(s): FREET3   Coagulation profile No results for input(s): INR, PROTIME in the last 168 hours. ------------------------------------------------------------------------------------------------------------------- No results for input(s): DDIMER in the last 72 hours. -------------------------------------------------------------------------------------------------------------------  Cardiac Enzymes Recent Labs  Lab 03/13/17 0825  TROPONINI <0.03   ------------------------------------------------------------------------------------------------------------------ Invalid input(s): POCBNP   ---------------------------------------------------------------------------------------------------------------  Urinalysis    Component Value Date/Time   COLORURINE STRAW (A) 03/02/2017 1422   APPEARANCEUR HAZY (A) 03/02/2017 1422   APPEARANCEUR Clear 06/05/2014 0213   LABSPEC 1.002 (L) 03/02/2017 1422   LABSPEC 1.013 06/05/2014 0213   PHURINE 7.0 03/02/2017 1422   GLUCOSEU NEGATIVE 03/02/2017 1422   GLUCOSEU Negative 06/05/2014 0213   HGBUR SMALL (A) 03/02/2017 1422   BILIRUBINUR NEGATIVE 03/02/2017 1422   BILIRUBINUR Negative 06/05/2014 0213   KETONESUR 5 (A) 03/02/2017 1422   PROTEINUR NEGATIVE 03/02/2017 1422   NITRITE NEGATIVE 03/02/2017 1422   LEUKOCYTESUR NEGATIVE 03/02/2017 1422   LEUKOCYTESUR Negative 06/05/2014 0213     Imaging results:   Dg Abdomen 1 View  Result Date: 03/13/2017 CLINICAL DATA:  Nasogastric tube placement. EXAM: ABDOMEN - 1 VIEW COMPARISON:  CT abdomen and pelvis March 13, 2017 FINDINGS: No nasogastric tube is evident on this study. Loops of bowel remain dilated. No free air. Surgical clips noted in right upper quadrant. Lung bases clear.  IMPRESSION: No nasogastric tube evident in the lower chest or abdominal regions. Persistent bowel dilatation. No free air. Electronically Signed   By: Lowella Grip III M.D.   On: 03/13/2017 15:10   Ct Abdomen Pelvis W Contrast  Result Date: 03/13/2017 CLINICAL DATA:  Abdominal pain and vomiting EXAM: CT ABDOMEN AND PELVIS WITH CONTRAST TECHNIQUE: Multidetector CT imaging of the abdomen and pelvis was performed using the standard protocol following bolus administration of intravenous contrast. CONTRAST:  100 mL Isovue-300 nonionic COMPARISON:  CT abdomen and pelvis June 05, 2014 and March 02, 2017 FINDINGS: Lower chest: There is bibasilar atelectasis. No lung base edema or consolidation. Hepatobiliary: No focal liver lesions are apparent. Gallbladder is absent. There is no biliary  duct dilatation. Pancreas: No pancreatic mass or inflammatory focus. Spleen: There are areas of decreased attenuation in the spleen which over time show enhancement consistent with splenic hemangiomas, stable. The larger of these hemangiomas is more posteriorly located measuring 2.0 x 1.8 cm. A smaller hemangioma is more anterior in location measuring 1.1 x 1.0 cm. No new splenic lesions are evident. Adrenals/Urinary Tract: Adrenals bilaterally appear normal. Kidneys bilaterally show no evident mass or hydronephrosis on either side. There is no appreciable renal or ureteral calculus on either side. Urinary bladder is midline with wall thickness within normal limits. Stomach/Bowel: There is evidence of previous anastomosis in the transverse colon which appears patent. There are multiple loops of jejunum which are fluid-filled and dilated. There is a transition zone in the distal jejunal region consistent with a degree of bowel obstruction. There is no free air or portal venous air. There are multiple diverticula throughout the sigmoid and descending colon without diverticulitis. Vascular/Lymphatic: There is atherosclerotic  calcification in the aorta and right common iliac artery. There is no demonstrable aneurysm. Major mesenteric vessels appear patent. No adenopathy is evident in the abdomen or pelvis. Reproductive: Uterus is absent.  No evident pelvic mass. Other: There is no periappendiceal region inflammation. There is moderate ascites in the pelvis. No abscess evident in the abdomen or pelvis. Musculoskeletal: Degenerative changes noted in the lower thoracic and lumbar spine. There are no evident blastic or lytic bone lesions. There is no intramuscular or abdominal wall lesion. IMPRESSION: 1. Small bowel obstruction with transition zone in the lower abdomen in the distal jejunal region. No free air. 2.   Previous transverse colonic anastomosis patent. 3.   Moderate ascites in the pelvis. 4.  No appreciable abscess in the abdomen or pelvis. 5.  No renal or ureteral calculus.  No hydronephrosis. 6.  Uterus and gallbladder absent. 7.  Aortoiliac atherosclerosis. Aortic Atherosclerosis (ICD10-I70.0). Electronically Signed   By: Lowella Grip III M.D.   On: 03/13/2017 10:47   Dg Abd 2 Views  Result Date: 03/13/2017 CLINICAL DATA:  Nausea and vomiting. EXAM: ABDOMEN - 2 VIEW COMPARISON:  CT abdomen and pelvis March 02, 2017 FINDINGS: There is a loop of borderline dilated small bowel in the right mid abdomen. No other bowel dilatation. However, there are multiple air-fluid levels throughout the abdomen and upper pelvis. No free air. Lung bases are clear. There are small phleboliths in the pelvis. Surgical clips are noted in the mid abdomen on the left. IMPRESSION: Single loop of borderline dilated small bowel with multiple air-fluid levels. The appearance is more suggestive of enteritis or early ileus than bowel obstruction, although early bowel obstruction is a differential consideration. Postoperative change noted in left abdomen. No free air. Lung bases clear. Electronically Signed   By: Lowella Grip III M.D.   On:  03/13/2017 09:03      Assessment & Plan  Active Problems:   SBO (small bowel obstruction) ([place])    52. 109 55-year-old female patient with nausea, vomiting, syncope found to have small bowel obstruction.  Management as per surgery, continue n.p.o., NG tube suction, IV fluids, serial  abdominal x-rays.  2.  Syncope likely secondary to vasovagal due to nausea, vomiting, dehydration, hypokalemia.  Patient does not need any further workup.  Cardiac enzymes are negative.  Lead EKG shows sinus rhythm at 58 bpm, no ST-T changes.  #3 history of anxiety, patient is on Xanax.  She is n.p.o. at this time, patient takes Xanax, Celexa as per her  PCP note recently.  Can use Ativan if needed for anxiety at 1 mg every 4-6 hours as needed. DVT Prophylaxis ; Lovenox. AM Labs Ordered, also please review Full Orders  Family Communication: Plan discussed with patient and her son.   Thank you for the consult, we will follow the patient with you in the Hospital.   Epifanio Lesches M.D on 03/13/2017 at 6:18 PM  Note: This dictation was prepared with Dragon dictation along with smaller phrase technology. Any transcriptional errors that result from this process are unintentional.

## 2017-03-13 NOTE — ED Notes (Signed)
Pt taken to the floor via stretcher. Report called to [facility]. All questions answered. VSS. NAD.

## 2017-03-13 NOTE — ED Triage Notes (Signed)
Pt to ED via EMS from home with c/o abd pain and vomitting since last night. PT was recently in ED on 1/20 with same complaints. Pt was given 4mg  zofran en route. Pt vomiting coming in

## 2017-03-14 ENCOUNTER — Inpatient Hospital Stay: Payer: Medicare Other

## 2017-03-14 LAB — BASIC METABOLIC PANEL
Anion gap: 7 (ref 5–15)
BUN: 14 mg/dL (ref 6–20)
CO2: 23 mmol/L (ref 22–32)
Calcium: 8.2 mg/dL — ABNORMAL LOW (ref 8.9–10.3)
Chloride: 110 mmol/L (ref 101–111)
Creatinine, Ser: 0.83 mg/dL (ref 0.44–1.00)
GFR calc Af Amer: >60 mL/min (ref >60)
GFR calc non Af Amer: >60 mL/min (ref >60)
Glucose, Bld: 111 mg/dL — ABNORMAL HIGH (ref 65–99)
Potassium: 3.2 mmol/L — ABNORMAL LOW (ref 3.5–5.1)
Sodium: 140 mmol/L (ref 135–145)

## 2017-03-14 LAB — CBC WITH DIFFERENTIAL/PLATELET
Basophils Absolute: 0 10*3/uL (ref 0–0.1)
Basophils Relative: 1 %
Eosinophils Absolute: 0 10*3/uL (ref 0–0.7)
Eosinophils Relative: 0 %
HCT: 40.1 % (ref 35.0–47.0)
Hemoglobin: 13.8 g/dL (ref 12.0–16.0)
Lymphocytes Relative: 30 %
Lymphs Abs: 1.8 10*3/uL (ref 1.0–3.6)
MCH: 31.5 pg (ref 26.0–34.0)
MCHC: 34.5 g/dL (ref 32.0–36.0)
MCV: 91.4 fL (ref 80.0–100.0)
Monocytes Absolute: 0.5 10*3/uL (ref 0.2–0.9)
Monocytes Relative: 9 %
Neutro Abs: 3.7 10*3/uL (ref 1.4–6.5)
Neutrophils Relative %: 60 %
Platelets: 220 10*3/uL (ref 150–440)
RBC: 4.38 MIL/uL (ref 3.80–5.20)
RDW: 13.3 % (ref 11.5–14.5)
WBC: 6.1 10*3/uL (ref 3.6–11.0)

## 2017-03-14 LAB — MAGNESIUM: Magnesium: 2.3 mg/dL (ref 1.7–2.4)

## 2017-03-14 MED ORDER — KETOROLAC TROMETHAMINE 30 MG/ML IJ SOLN
15.0000 mg | Freq: Four times a day (QID) | INTRAMUSCULAR | Status: DC | PRN
  Administered 2017-03-14: 15 mg via INTRAVENOUS
  Filled 2017-03-14: qty 1

## 2017-03-14 MED ORDER — ALPRAZOLAM 0.25 MG PO TABS
0.2500 mg | ORAL_TABLET | Freq: Every day | ORAL | Status: DC
  Administered 2017-03-14 – 2017-03-15 (×2): 0.25 mg via ORAL
  Filled 2017-03-14 (×2): qty 1

## 2017-03-14 MED ORDER — KCL IN DEXTROSE-NACL 20-5-0.45 MEQ/L-%-% IV SOLN
INTRAVENOUS | Status: DC
Start: 2017-03-14 — End: 2017-03-16
  Administered 2017-03-14 – 2017-03-15 (×3): via INTRAVENOUS
  Filled 2017-03-14 (×9): qty 1000

## 2017-03-14 NOTE — Progress Notes (Signed)
[place] [facility] [place] NAME: [name]    MR#:  [id number]  DATE OF BIRTH:  12-16-1945  SUBJECTIVE:  CHIEF COMPLAINT:   Chief Complaint  Patient presents with  . Emesis  . Abdominal Pain  slowly improving. Refuses NGT REVIEW OF SYSTEMS:  Review of Systems  Constitutional: Negative for chills, fever and weight loss.  HENT: Negative for nosebleeds and sore throat.   Eyes: Negative for blurred vision.  Respiratory: Negative for cough, shortness of breath and wheezing.   Cardiovascular: Negative for chest pain, orthopnea, leg swelling and PND.  Gastrointestinal: Positive for abdominal pain. Negative for constipation, diarrhea, heartburn, nausea and vomiting.  Genitourinary: Negative for dysuria and urgency.  Musculoskeletal: Negative for back pain.  Skin: Negative for rash.  Neurological: Negative for dizziness, speech change, focal weakness and headaches.  Endo/Heme/Allergies: Does not bruise/bleed easily.  Psychiatric/Behavioral: Negative for depression.    DRUG ALLERGIES:   Allergies  Allergen Reactions  . Levofloxacin     Tongue and mouth swelling  . Calcium Channel Blockers     Other reaction(s): Dizziness  . Amoxicillin Other (See Comments)    Has patient had a PCN reaction causing immediate rash, facial/tongue/throat swelling, SOB or lightheadedness with hypotension: No Has patient had a PCN reaction causing severe rash involving mucus membranes or skin necrosis: No Has patient had a PCN reaction that required hospitalization: No Has patient had a PCN reaction occurring within the last 10 years: No If all of the above answers are "NO", then may proceed with Cephalosporin use.   . Nickel Rash  . Penicillins Other (See Comments)    Has patient had a PCN reaction causing immediate rash, facial/tongue/throat swelling, SOB or lightheadedness with hypotension: No Has patient had a PCN reaction causing severe rash involving  mucus membranes or skin necrosis: No Has patient had a PCN reaction that required hospitalization: No Has patient had a PCN reaction occurring within the last 10 years: No If all of the above answers are "NO", then may proceed with Cephalosporin use.   VITALS:  Blood pressure 137/71, pulse 63, temperature 97.6 F (36.4 C), temperature source Oral, resp. rate 17, height 5\' 3"  (1.6 m), weight 77.8 kg (171 lb 8.3 oz), SpO2 99 %. PHYSICAL EXAMINATION:  Physical Exam  Constitutional: She is oriented to person, place, and time and well-developed, well-nourished, and in no distress.  HENT:  Head: Normocephalic and atraumatic.  Eyes: Conjunctivae and EOM are normal. Pupils are equal, round, and reactive to light.  Neck: Normal range of motion. Neck supple. No tracheal deviation present. No thyromegaly present.  Cardiovascular: Normal rate, regular rhythm and normal heart sounds.  Pulmonary/Chest: Effort normal and breath sounds normal. No respiratory distress. She has no wheezes. She exhibits no tenderness.  Abdominal: Soft. Bowel sounds are normal. She exhibits distension. There is no tenderness.  Musculoskeletal: Normal range of motion.  Neurological: She is alert and oriented to person, place, and time. No cranial nerve deficit.  Skin: Skin is warm and dry. No rash noted.  Psychiatric: Mood and affect normal.   LABORATORY PANEL:  Female CBC Recent Labs  Lab 03/14/17 0316  WBC 6.1  HGB 13.8  HCT 40.1  PLT 220   ------------------------------------------------------------------------------------------------------------------ Chemistries  Recent Labs  Lab 03/13/17 0825 03/14/17 0316  NA 134* 140  K 3.3* 3.2*  CL 102 110  CO2 24 23  GLUCOSE 191* 111*  BUN 14 14  CREATININE 0.80 0.83  CALCIUM  9.1 8.2*  MG  --  2.3  AST 26  --   ALT 15  --   ALKPHOS 52  --   BILITOT 1.0  --    RADIOLOGY:  Dg Abd 1 View  Result Date: 03/14/2017 CLINICAL DATA:  Small bowel obstruction EXAM:  ABDOMEN - 1 VIEW COMPARISON:  03/13/2017 FINDINGS: Gaseous distension of small bowel and colon. There is no bowel dilatation to suggest obstruction. There is no evidence of pneumoperitoneum, portal venous gas or pneumatosis. There are no pathologic calcifications along the expected course of the ureters. The osseous structures are unremarkable. IMPRESSION: Gaseous distension of small bowel and colon. No definite evidence of small bowel obstruction. Electronically Signed   By: Kathreen Devoid   On: 03/14/2017 08:07   ASSESSMENT AND PLAN:  72 year old female patient with nausea, vomiting, syncope found to have small bowel obstruction.    1. SBO:  - NPO with only ice chips  -Continue IV fluids.  Encourage ambulattion  2.  Syncope likely secondary to vasovagal due to nausea, vomiting, dehydration, hypokalemia.  Patient does not need any further workup.  Cardiac enzymes are negative.  Lead EKG shows sinus rhythm at 58 bpm, no ST-T changes.  #3 history of anxiety: stable. Not needed meds - Can use Ativan if needed for anxiety at 1 mg every 4-6 hours as needed.  4. Hypokalemia: replete and recheck       All the records are reviewed and case discussed with Care Management/Social Worker. Management plans discussed with the patient, family and they are in agreement.  CODE STATUS: Full Code  TOTAL TIME TAKING CARE OF THIS PATIENT: 15 minutes.   More than 50% of the time was spent in counseling/coordination of care: Valaria Good M.D on 03/14/2017 at 7:04 PM  Between 7am to 6pm - Pager - [phone]  After 6pm go to [email or web address] - Proofreader  [facility] [place] [facility]  Office  [phone]  CC: Primary care physician; Birdie Sons, MD  Note: This dictation was prepared with Dragon dictation along with smaller phrase technology. Any transcriptional errors that result from this process are unintentional.

## 2017-03-14 NOTE — Progress Notes (Signed)
03/14/2017  Subjective: No acute events overnight.  Patient reports that she feels that she did pass some gas earlier but she did feel nauseous after drinking some ice chips tonight.  No further vomiting.  Her pain has improved.  Vital signs: Temp:  [97.5 F (36.4 C)-98.3 F (36.8 C)] 98.3 F (36.8 C) (01/25 0730) Pulse Rate:  [61-71] 68 (01/25 0730) Resp:  [16-20] 16 (01/25 0730) BP: (128-168)/(70-97) 128/76 (01/25 0730) SpO2:  [96 %-100 %] 99 % (01/25 0730) Weight:  [77.8 kg (171 lb 8.3 oz)] 77.8 kg (171 lb 8.3 oz) (01/25 1125)   Intake/Output: 01/24 0701 - 01/25 0700 In: 717 [I.V.:717] Out: 400 [Urine:400] Last BM Date: 03/13/17  Physical Exam: Constitutional:  Cardiac:   Pulm: Abdomen:  Labs:  Recent Labs    03/13/17 0825 03/14/17 0316  WBC 7.3 6.1  HGB 15.6 13.8  HCT 46.4 40.1  PLT 228 220   Recent Labs    03/13/17 0825 03/14/17 0316  NA 134* 140  K 3.3* 3.2*  CL 102 110  CO2 24 23  GLUCOSE 191* 111*  BUN 14 14  CREATININE 0.80 0.83  CALCIUM 9.1 8.2*   No results for input(s): LABPROT, INR in the last 72 hours.  Imaging: Dg Abd 1 View  Result Date: 03/14/2017 CLINICAL DATA:  Small bowel obstruction EXAM: ABDOMEN - 1 VIEW COMPARISON:  03/13/2017 FINDINGS: Gaseous distension of small bowel and colon. There is no bowel dilatation to suggest obstruction. There is no evidence of pneumoperitoneum, portal venous gas or pneumatosis. There are no pathologic calcifications along the expected course of the ureters. The osseous structures are unremarkable. IMPRESSION: Gaseous distension of small bowel and colon. No definite evidence of small bowel obstruction. Electronically Signed   By: Kathreen Devoid   On: 03/14/2017 08:07   Dg Abdomen 1 View  Result Date: 03/13/2017 CLINICAL DATA:  Nasogastric tube placement. EXAM: ABDOMEN - 1 VIEW COMPARISON:  CT abdomen and pelvis March 13, 2017 FINDINGS: No nasogastric tube is evident on this study. Loops of bowel remain  dilated. No free air. Surgical clips noted in right upper quadrant. Lung bases clear. IMPRESSION: No nasogastric tube evident in the lower chest or abdominal regions. Persistent bowel dilatation. No free air. Electronically Signed   By: Lowella Grip III M.D.   On: 03/13/2017 15:10    Assessment/Plan: 72 year old female with small bowel obstruction.  -Continue n.p.o. today with only ice chips at most.  If later on she is feeling better we may be able to start her on clears but only if she is having better bowel function and no further nausea.  NG tube was not able to be placed yesterday and the patient would rather have no NG tube at this moment. -Continue IV fluids.  Out of bed and ambulate. -Appreciate hospitalist consult.  No further workup for syncope needed.   Melvyn Neth, [place]

## 2017-03-14 NOTE — Progress Notes (Signed)
Pt expressed concern that she had not   taken xanax in several days. Primary nurse paged and spoke to Dr. Vianne Bulls. Orders received to reorder pt 0.25mg  of xanax daily. Primary nurse to continue to monitor.

## 2017-03-15 ENCOUNTER — Inpatient Hospital Stay: Payer: Medicare Other

## 2017-03-15 LAB — BASIC METABOLIC PANEL
Anion gap: 5 (ref 5–15)
BUN: 8 mg/dL (ref 6–20)
CO2: 24 mmol/L (ref 22–32)
Calcium: 8.1 mg/dL — ABNORMAL LOW (ref 8.9–10.3)
Chloride: 108 mmol/L (ref 101–111)
Creatinine, Ser: 0.87 mg/dL (ref 0.44–1.00)
GFR calc Af Amer: >60 mL/min (ref >60)
GFR calc non Af Amer: >60 mL/min (ref >60)
Glucose, Bld: 113 mg/dL — ABNORMAL HIGH (ref 65–99)
Potassium: 3.1 mmol/L — ABNORMAL LOW (ref 3.5–5.1)
Sodium: 137 mmol/L (ref 135–145)

## 2017-03-15 LAB — GLUCOSE, CAPILLARY: Glucose-Capillary: 103 mg/dL — ABNORMAL HIGH (ref 65–99)

## 2017-03-15 MED ORDER — POTASSIUM CHLORIDE CRYS ER 20 MEQ PO TBCR
40.0000 meq | EXTENDED_RELEASE_TABLET | ORAL | Status: AC
  Administered 2017-03-15 (×2): 40 meq via ORAL
  Filled 2017-03-15 (×3): qty 2

## 2017-03-15 MED ORDER — CITALOPRAM HYDROBROMIDE 20 MG PO TABS
10.0000 mg | ORAL_TABLET | Freq: Every day | ORAL | Status: DC
  Administered 2017-03-15 – 2017-03-16 (×2): 10 mg via ORAL
  Filled 2017-03-15 (×2): qty 1

## 2017-03-15 MED ORDER — ALPRAZOLAM 0.25 MG PO TABS: 0.2500 mg | ORAL_TABLET | Freq: Every evening | ORAL | Status: DC | PRN

## 2017-03-15 NOTE — Progress Notes (Signed)
03/15/2017  Subjective: Patient had some anxiety overnight.  Feels that some liquids were causing some discomfort yesterday.  No nausea or vomiting.  Has been having some flatus.  Vital signs: Temp:  [97.9 F (36.6 C)-98.7 F (37.1 C)] 98.7 F (37.1 C) (01/26 1250) Pulse Rate:  [57-95] 57 (01/26 1250) Resp:  [16-24] 18 (01/26 1250) BP: (130-161)/(75-98) 161/80 (01/26 1250) SpO2:  [98 %-100 %] 100 % (01/26 1250)   Intake/Output: 01/25 0701 - 01/26 0700 In: 2808.9 [P.O.:120; I.V.:2688.9] Out: 1400 [Urine:1400] Last BM Date: 03/13/17  Physical Exam: Constitutional: No acute distress Abdomen:  Soft, less distended, with some tenderness to palpation over epigastric area.  This is the area that the patient has been reporting has pain and discomfort when she eats at home.  Labs:  Recent Labs    03/13/17 0825 03/14/17 0316  WBC 7.3 6.1  HGB 15.6 13.8  HCT 46.4 40.1  PLT 228 220   Recent Labs    03/14/17 0316 03/15/17 0602  NA 140 137  K 3.2* 3.1*  CL 110 108  CO2 23 24  GLUCOSE 111* 113*  BUN 14 8  CREATININE 0.83 0.87  CALCIUM 8.2* 8.1*   No results for input(s): LABPROT, INR in the last 72 hours.  Imaging: Dg Abd 1 View  Result Date: 03/15/2017 CLINICAL DATA:  Possible small bowel obstruction. EXAM: ABDOMEN - 1 VIEW COMPARISON:  03/14/2017 FINDINGS: No diffuse gaseous small bowel distention to suggest obstruction. A single distended small bowel loop in the right abdomen measures up to 3.6 cm but this is relatively short segment. Other scattered areas of nondistended small bowel are identified in the gaseous distention seen on yesterday's study has decreased. Air is seen scattered along the course of the nondilated colon. Surgical clips in the right upper quadrant suggest prior cholecystectomy. IMPRESSION: No overt bowel obstructive pattern on today's study. Electronically Signed   By: Misty Stanley M.D.   On: 03/15/2017 11:34    Assessment/Plan: 72 yo female with  sbo.  --KUB this morning independently viewed by me and report reviewed.  Her small bowel looks better and agree there is a short segment that is a bit dilated still.  However, she has gas in her colon and rectum and she is having flatus too.  She may just be lingering a bit in her recovery. --will continue clear liquids for today and likely advance more tomorrow.   Melvyn Neth, [place]

## 2017-03-15 NOTE — Progress Notes (Signed)
[facility] at [place] NAME: [name]    MR#:  [id number]  DATE OF BIRTH:  03-17-1945  SUBJECTIVE:  CHIEF COMPLAINT:   Chief Complaint  Patient presents with  . Emesis  . Abdominal Pain  Patient's abdominal pain is less  REVIEW OF SYSTEMS:  Review of Systems  Constitutional: Negative for chills, fever and weight loss.  HENT: Negative for nosebleeds and sore throat.   Eyes: Negative for blurred vision.  Respiratory: Negative for cough, shortness of breath and wheezing.   Cardiovascular: Negative for chest pain, orthopnea, leg swelling and PND.  Gastrointestinal: Positive for abdominal pain. Negative for constipation, diarrhea, heartburn, nausea and vomiting.  Genitourinary: Negative for dysuria and urgency.  Musculoskeletal: Negative for back pain.  Skin: Negative for rash.  Neurological: Negative for dizziness, speech change, focal weakness and headaches.  Endo/Heme/Allergies: Does not bruise/bleed easily.  Psychiatric/Behavioral: Negative for depression.    DRUG ALLERGIES:   Allergies  Allergen Reactions  . Levofloxacin     Tongue and mouth swelling  . Calcium Channel Blockers     Other reaction(s): Dizziness  . Amoxicillin Other (See Comments)    Has patient had a PCN reaction causing immediate rash, facial/tongue/throat swelling, SOB or lightheadedness with hypotension: No Has patient had a PCN reaction causing severe rash involving mucus membranes or skin necrosis: No Has patient had a PCN reaction that required hospitalization: No Has patient had a PCN reaction occurring within the last 10 years: No If all of the above answers are "NO", then may proceed with Cephalosporin use.   . Nickel Rash  . Penicillins Other (See Comments)    Has patient had a PCN reaction causing immediate rash, facial/tongue/throat swelling, SOB or lightheadedness with hypotension: No Has patient had a PCN reaction causing severe rash  involving mucus membranes or skin necrosis: No Has patient had a PCN reaction that required hospitalization: No Has patient had a PCN reaction occurring within the last 10 years: No If all of the above answers are "NO", then may proceed with Cephalosporin use.   VITALS:  Blood pressure (!) 161/80, pulse (!) 57, temperature 98.7 F (37.1 C), temperature source Oral, resp. rate 18, height 5\' 3"  (1.6 m), weight 171 lb 8.3 oz (77.8 kg), SpO2 100 %. PHYSICAL EXAMINATION:  Physical Exam  Constitutional: She is oriented to person, place, and time and well-developed, well-nourished, and in no distress.  HENT:  Head: Normocephalic and atraumatic.  Eyes: Conjunctivae and EOM are normal. Pupils are equal, round, and reactive to light.  Neck: Normal range of motion. Neck supple. No tracheal deviation present. No thyromegaly present.  Cardiovascular: Normal rate, regular rhythm and normal heart sounds.  Pulmonary/Chest: Effort normal and breath sounds normal. No respiratory distress. She has no wheezes. She exhibits no tenderness.  Abdominal: Soft. Bowel sounds are normal. She exhibits no distension. There is no tenderness.  Musculoskeletal: Normal range of motion.  Neurological: She is alert and oriented to person, place, and time. No cranial nerve deficit.  Skin: Skin is warm and dry. No rash noted.  Psychiatric: Mood and affect normal.   LABORATORY PANEL:  Female CBC Recent Labs  Lab 03/14/17 0316  WBC 6.1  HGB 13.8  HCT 40.1  PLT 220   ------------------------------------------------------------------------------------------------------------------ Chemistries  Recent Labs  Lab 03/13/17 0825 03/14/17 0316 03/15/17 0602  NA 134* 140 137  K 3.3* 3.2* 3.1*  CL 102 110 108  CO2 24 23 24   GLUCOSE 191*  111* 113*  BUN 14 14 8   CREATININE 0.80 0.83 0.87  CALCIUM 9.1 8.2* 8.1*  MG  --  2.3  --   AST 26  --   --   ALT 15  --   --   ALKPHOS 52  --   --   BILITOT 1.0  --   --     RADIOLOGY:  Dg Abd 1 View  Result Date: 03/15/2017 CLINICAL DATA:  Possible small bowel obstruction. EXAM: ABDOMEN - 1 VIEW COMPARISON:  03/14/2017 FINDINGS: No diffuse gaseous small bowel distention to suggest obstruction. A single distended small bowel loop in the right abdomen measures up to 3.6 cm but this is relatively short segment. Other scattered areas of nondistended small bowel are identified in the gaseous distention seen on yesterday's study has decreased. Air is seen scattered along the course of the nondilated colon. Surgical clips in the right upper quadrant suggest prior cholecystectomy. IMPRESSION: No overt bowel obstructive pattern on today's study. Electronically Signed   By: Misty Stanley M.D.   On: 03/15/2017 11:34   ASSESSMENT AND PLAN:  72 year old female patient with nausea, vomiting, syncope found to have small bowel obstruction.    1. SBO:  -Seems to be improving -Ambulate  2.  Syncope likely secondary to vasovagal due to nausea, vomiting, dehydration, hypokalemia.  Patient does not need any further workup.  Cardiac enzymes are negative.   Stable EKG  #3 history of anxiety: stable.  - Can use Ativan if needed for anxiety at 1 mg every 4-6 hours as needed.  4. Hypokalemia: replete and recheck-still low will replace her potassium       All the records are reviewed and case discussed with Care Management/Social Worker. Management plans discussed with the patient, family and they are in agreement.  CODE STATUS: Full Code  TOTAL TIME TAKING CARE OF THIS PATIENT: 15 minutes.   More than 50% of the time was spent in counseling/coordination of care: Tawni Levy M.D on 03/15/2017 at 2:43 PM  Between 7am to 6pm - Pager - [phone]  After 6pm go to [email or web address] - Proofreader  [facility]  Office  [phone]  CC: Primary care physician; Birdie Sons, MD  Note: This dictation was prepared with  Dragon dictation along with smaller phrase technology. Any transcriptional errors that result from this process are unintentional.

## 2017-03-15 NOTE — Progress Notes (Signed)
Primary nurse was called to room by Nursing assistant. Pt states that she feels anxious and states that she has had this feeling before when she has passed out.  VSS, No complaints of pain nor nausea. Medications reviewed. Pt states that she takes Celexa 10 mg at home. Xanax given stated back last night.  Primary nurse paged and spoke to Dr. Marcille Blanco. MD to place orders for celexa. . Primary nurse to continue to monitor.

## 2017-03-16 LAB — BASIC METABOLIC PANEL
Anion gap: 6 (ref 5–15)
BUN: <5 mg/dL — ABNORMAL LOW (ref 6–20)
CO2: 22 mmol/L (ref 22–32)
Calcium: 8.7 mg/dL — ABNORMAL LOW (ref 8.9–10.3)
Chloride: 111 mmol/L (ref 101–111)
Creatinine, Ser: 0.68 mg/dL (ref 0.44–1.00)
GFR calc Af Amer: >60 mL/min (ref >60)
GFR calc non Af Amer: >60 mL/min (ref >60)
Glucose, Bld: 115 mg/dL — ABNORMAL HIGH (ref 65–99)
Potassium: 3.9 mmol/L (ref 3.5–5.1)
Sodium: 139 mmol/L (ref 135–145)

## 2017-03-16 MED ORDER — ONDANSETRON 4 MG PO TBDP: 4.0000 mg | ORAL_TABLET | Freq: Four times a day (QID) | ORAL | 0 refills | Status: DC | PRN

## 2017-03-16 NOTE — Progress Notes (Signed)
Pt was given D/C instructions, VS WDL, and Iv was removed without incident. Pt was released to family member.

## 2017-03-16 NOTE — Progress Notes (Signed)
03/16/2017  Subjective: No acute events overnight.  Patient tolerated clears.  Did describe another episode of some anxiety which then causes her nausea and abdominal discomfort.  However she still having flatus.  Vital signs: Temp:  [98.1 F (36.7 C)-98.7 F (37.1 C)] 98.1 F (36.7 C) (01/27 0410) Pulse Rate:  [57-65] 65 (01/27 0410) Resp:  [16-18] 16 (01/27 0410) BP: (137-161)/(73-83) 137/73 (01/27 0410) SpO2:  [98 %-100 %] 99 % (01/27 0410)   Intake/Output: 01/26 0701 - 01/27 0700 In: 2597.3 [P.O.:660; I.V.:1937.3] Out: 2150 [Urine:2150] Last BM Date: 03/15/17  Physical Exam: Constitutional: No acute distress Abdomen: Soft, nondistended, with that same area of mild discomfort over the epigastric region.  Labs:  Recent Labs    03/14/17 0316  WBC 6.1  HGB 13.8  HCT 40.1  PLT 220   Recent Labs    03/15/17 0602 03/16/17 0607  NA 137 139  K 3.1* 3.9  CL 108 111  CO2 24 22  GLUCOSE 113* 115*  BUN 8 <5*  CREATININE 0.87 0.68  CALCIUM 8.1* 8.7*   No results for input(s): LABPROT, INR in the last 72 hours.  Imaging: No results found.  Assessment/Plan: 72 year old female with small bowel obstruction.  -Advancing patient's diet today to a soft diet for breakfast.  If patient tolerates, we may be able to discharge her to home later today.   Melvyn Neth, [place]

## 2017-03-16 NOTE — Progress Notes (Signed)
[facility] at [place] NAME: Marguetta Windish    MR#:  [id number]  DATE OF BIRTH:  [birth year]  SUBJECTIVE:  CHIEF COMPLAINT:   Chief Complaint  Patient presents with  . Emesis  . Abdominal Pain  pt anxious  Feels abdominal pain better    REVIEW OF SYSTEMS:  Review of Systems  Constitutional: Negative for chills, fever and weight loss.  HENT: Negative for nosebleeds and sore throat.   Eyes: Negative for blurred vision.  Respiratory: Negative for cough, shortness of breath and wheezing.   Cardiovascular: Negative for chest pain, orthopnea, leg swelling and PND.  Gastrointestinal: Negative for abdominal pain, constipation, diarrhea, heartburn, nausea and vomiting.  Genitourinary: Negative for dysuria and urgency.  Musculoskeletal: Negative for back pain.  Skin: Negative for rash.  Neurological: Negative for dizziness, speech change, focal weakness and headaches.  Endo/Heme/Allergies: Does not bruise/bleed easily.  Psychiatric/Behavioral: Negative for depression.    DRUG ALLERGIES:   Allergies  Allergen Reactions  . Levofloxacin     Tongue and mouth swelling  . Calcium Channel Blockers     Other reaction(s): Dizziness  . Amoxicillin Other (See Comments)    Has patient had a PCN reaction causing immediate rash, facial/tongue/throat swelling, SOB or lightheadedness with hypotension: No Has patient had a PCN reaction causing severe rash involving mucus membranes or skin necrosis: No Has patient had a PCN reaction that required hospitalization: No Has patient had a PCN reaction occurring within the last 10 years: No If all of the above answers are "NO", then may proceed with Cephalosporin use.   . Nickel Rash  . Penicillins Other (See Comments)    Has patient had a PCN reaction causing immediate rash, facial/tongue/throat swelling, SOB or lightheadedness with hypotension: No Has patient had a PCN reaction causing severe rash involving  mucus membranes or skin necrosis: No Has patient had a PCN reaction that required hospitalization: No Has patient had a PCN reaction occurring within the last 10 years: No If all of the above answers are "NO", then may proceed with Cephalosporin use.   VITALS:  Blood pressure (!) 172/85, pulse 66, temperature 98.2 F (36.8 C), temperature source Oral, resp. rate 18, height 5\' 3"  (1.6 m), weight 171 lb 8.3 oz (77.8 kg), SpO2 100 %. PHYSICAL EXAMINATION:  Physical Exam  Constitutional: She is oriented to person, place, and time and well-developed, well-nourished, and in no distress.  HENT:  Head: Normocephalic and atraumatic.  Eyes: Conjunctivae and EOM are normal. Pupils are equal, round, and reactive to light.  Neck: Normal range of motion. Neck supple. No tracheal deviation present. No thyromegaly present.  Cardiovascular: Normal rate, regular rhythm and normal heart sounds.  Pulmonary/Chest: Effort normal and breath sounds normal. No respiratory distress. She has no wheezes. She exhibits no tenderness.  Abdominal: Soft. Bowel sounds are normal. She exhibits no distension. There is no tenderness.  Musculoskeletal: Normal range of motion.  Neurological: She is alert and oriented to person, place, and time. No cranial nerve deficit.  Skin: Skin is warm and dry. No rash noted.  Psychiatric: Mood and affect normal.   LABORATORY PANEL:  Female CBC Recent Labs  Lab 03/14/17 0316  WBC 6.1  HGB 13.8  HCT 40.1  PLT 220   ------------------------------------------------------------------------------------------------------------------ Chemistries  Recent Labs  Lab 03/13/17 0825 03/14/17 0316  03/16/17 0607  NA 134* 140   < > 139  K 3.3* 3.2*   < > 3.9  CL 102  110   < > 111  CO2 24 23   < > 22  GLUCOSE 191* 111*   < > 115*  BUN 14 14   < > <5*  CREATININE 0.80 0.83   < > 0.68  CALCIUM 9.1 8.2*   < > 8.7*  MG  --  2.3  --   --   AST 26  --   --   --   ALT 15  --   --   --     ALKPHOS 52  --   --   --   BILITOT 1.0  --   --   --    < > = values in this interval not displayed.   RADIOLOGY:  No results found. ASSESSMENT AND PLAN:  72 year old female patient with nausea, vomiting, syncope found to have small bowel obstruction.    1. SBO:  - resolved - possible discharge per surgery - recommend to patient miralex on discharge  2.  Syncope likely secondary to vasovagal due to nausea, vomiting, dehydration, hypokalemia.  Patient does not need any further workup.  Cardiac enzymes are negative.   Stable EKG  #3 history of anxiety: stable.  - Can use Ativan if needed for anxiety at 1 mg every 4-6 hours as needed.  4. Hypokalemia: repalced please call with questions     All the records are reviewed and case discussed with Care Management/Social Worker. Management plans discussed with the patient, family and they are in agreement.  CODE STATUS: Full Code  TOTAL TIME TAKING CARE OF THIS PATIENT: 15 minutes.   More than 50% of the time was spent in counseling/coordination of care: Tawni Levy M.D on 03/16/2017 at 1:48 PM  Between 7am to 6pm - Pager - [phone]  After 6pm go to [email or web address] - Proofreader  [facility]  Office  [phone]  CC: Primary care physician; Birdie Sons, MD  Note: This dictation was prepared with Dragon dictation along with smaller phrase technology. Any transcriptional errors that result from this process are unintentional.

## 2017-03-16 NOTE — Discharge Summary (Signed)
Patient ID: [name] MRN: [id number] DOB/AGE: 11-12-1945 72 y.o.  Admit date: 03/13/2017 Discharge date: 03/16/2017   Discharge Diagnoses:  Active Problems:   Small bowel obstruction ([place])   Procedures:  None  Hospital Course: Patient was admitted on 1/24 with small bowel obstruction.  NG tube could not be placed but the patient was NPO with IV fluid hydration.  She slowly regained bowel function and her diet was advanced.  She had times of anxiety which were treated with Xanax.  Hospitalist team followed the patient due to syncope prior to her hospital admission at the time of nausea/vomiting at home.  Xanax also helped with her abdominal discomfort.  She was having flatus, tolerating a diet without worsening pain and was deemed ready for discharge.  Consults:  Hospitalist  Disposition: 01-Home or Self Care  Discharge Instructions    Call MD for:  difficulty breathing, headache or visual disturbances   Complete by:  As directed    Call MD for:  persistant nausea and vomiting   Complete by:  As directed    Call MD for:  severe uncontrolled pain   Complete by:  As directed    Diet - low sodium heart healthy   Complete by:  As directed    Discharge instructions   Complete by:  As directed    1.  Diet as tolerated 2.  Call MD if worsening nausea, vomiting, abdominal pain, or other concerns.   Increase activity slowly   Complete by:  As directed      Allergies as of 03/16/2017      Reactions   Levofloxacin    Tongue and mouth swelling   Calcium Channel Blockers    Other reaction(s): Dizziness   Amoxicillin Other (See Comments)   Has patient had a PCN reaction causing immediate rash, facial/tongue/throat swelling, SOB or lightheadedness with hypotension: No Has patient had a PCN reaction causing severe rash involving mucus membranes or skin necrosis: No Has patient had a PCN reaction that required hospitalization: No Has patient had a PCN reaction occurring within the  last 10 years: No If all of the above answers are "NO", then may proceed with Cephalosporin use.   Nickel Rash   Penicillins Other (See Comments)   Has patient had a PCN reaction causing immediate rash, facial/tongue/throat swelling, SOB or lightheadedness with hypotension: No Has patient had a PCN reaction causing severe rash involving mucus membranes or skin necrosis: No Has patient had a PCN reaction that required hospitalization: No Has patient had a PCN reaction occurring within the last 10 years: No If all of the above answers are "NO", then may proceed with Cephalosporin use.      Medication List    TAKE these medications   ALPRAZolam 0.25 MG tablet Commonly known as:  XANAX 1/2 - 1 tablet every evening   aspirin 81 MG tablet Take 81 mg by mouth daily.   citalopram 20 MG tablet Commonly known as:  CELEXA Take 1 tablet (20 mg total) daily by mouth. What changed:  how much to take   nystatin cream Commonly known as:  MYCOSTATIN Apply 1 application topically 2 (two) times daily. Mix with equal amounts of hydrocortisone cream   omeprazole 20 MG capsule Commonly known as:  PRILOSEC Take 20 mg by mouth daily as needed.   ondansetron 4 MG disintegrating tablet Commonly known as:  ZOFRAN-ODT Take 1 tablet (4 mg total) by mouth every 6 (six) hours as needed for nausea.  Probiotic Caps Take by mouth.      Follow-up Information    [name], [name], MD Follow up.   Specialty:  Family Medicine Why:  Follow up with your PCP at your convenience for overall health management. Contact information: [address]  [place] [address] [phone]        Olean Ree, MD Follow up.   Specialty:  Surgery Why:  Follow up on an as needed basis. Contact information: [place] [address] [phone]

## 2017-03-18 ENCOUNTER — Telehealth: Payer: Self-pay | Admitting: Family Medicine

## 2017-03-18 NOTE — Telephone Encounter (Signed)
Patient was scheduled for HFU on 03/21/2017 at 3pm.  She was D/C on 03/16/2017 for bowel obstruction.

## 2017-03-21 ENCOUNTER — Encounter: Payer: Self-pay | Admitting: Family Medicine

## 2017-03-21 ENCOUNTER — Ambulatory Visit (INDEPENDENT_AMBULATORY_CARE_PROVIDER_SITE_OTHER): Payer: Medicare Other | Admitting: Family Medicine

## 2017-03-21 VITALS — BP 124/88 | HR 63 | Temp 97.9°F | Resp 16 | Wt 160.0 lb

## 2017-03-21 DIAGNOSIS — F410 Panic disorder [episodic paroxysmal anxiety] without agoraphobia: Secondary | ICD-10-CM | POA: Diagnosis not present

## 2017-03-21 DIAGNOSIS — K56609 Unspecified intestinal obstruction, unspecified as to partial versus complete obstruction: Secondary | ICD-10-CM

## 2017-03-21 NOTE — Progress Notes (Signed)
Patient: [name] Female    DOB: [birth year]   72 y.o.   MRN: [id number] Visit Date: 03/21/2017  Today's Provider: Lelon Huh, MD   Chief Complaint  Patient presents with  . Hospitalization Follow-up   Subjective:    HPI   Follow up Hospitalization  Patient was admitted to [facility] on 03/13/2017 and discharged on 03/16/2017. She was treated for; small bowel obstruction. She had been in ER on 03/02/2017 for constipation and had normal CT at that time, but she returned on 1/24 an SBO was visualized. She states that she has had several similar episodes in the past when she felt like food stopped moving through stomach followed by sweats, feeling very hot and passing out. With one episode a family member performed a Heimlich maneuver which caused her to vomit, but followed by resolution of symptoms.  Treatment for this included; NG tube could not be placed but the patient was NPO with IV fluid                 hydration.  She slowly regained bowel function and her diet was advanced.  She had times of                   anxiety which were treated with Xanax. She states she has since been taking twice every day and anxiety is much better.  She states that while she was hospitalized Dr. Hampton Abbot initially scheduled her for surgery, but was cancelled after SBO resolved spontaneously .  Telephone follow up was done on 03/18/2017  She reports this condition is Improved. Although she still has poor appetite. She states she was .  ----------------------------------------------------------------  Patient has had some improvement, however she states she is not back to normal.  Wt Readings from Last 3 Encounters:  03/21/17 160 lb (72.6 kg)  03/14/17 171 lb 8.3 oz (77.8 kg)  03/03/17 160 lb (72.6 kg)      Allergies  Allergen Reactions  . Levofloxacin     Tongue and mouth swelling  . Calcium Channel Blockers     Other reaction(s): Dizziness  . Amoxicillin Other (See Comments)    Has  patient had a PCN reaction causing immediate rash, facial/tongue/throat swelling, SOB or lightheadedness with hypotension: No Has patient had a PCN reaction causing severe rash involving mucus membranes or skin necrosis: No Has patient had a PCN reaction that required hospitalization: No Has patient had a PCN reaction occurring within the last 10 years: No If all of the above answers are "NO", then may proceed with Cephalosporin use.   . Nickel Rash  . Penicillins Other (See Comments)    Has patient had a PCN reaction causing immediate rash, facial/tongue/throat swelling, SOB or lightheadedness with hypotension: No Has patient had a PCN reaction causing severe rash involving mucus membranes or skin necrosis: No Has patient had a PCN reaction that required hospitalization: No Has patient had a PCN reaction occurring within the last 10 years: No If all of the above answers are "NO", then may proceed with Cephalosporin use.     Current Outpatient Medications:  .  ALPRAZolam (XANAX) 0.25 MG tablet, 1/2 - 1 tablet every evening, Disp: 30 tablet, Rfl: 3 .  aspirin 81 MG tablet, Take 81 mg by mouth daily. , Disp: , Rfl:  .  citalopram (CELEXA) 20 MG tablet, Take 1 tablet (20 mg total) daily by mouth. (Patient taking differently: Take 10 mg by mouth daily. ),  Disp: 90 tablet, Rfl: 4 .  nystatin cream (MYCOSTATIN), Apply 1 application topically 2 (two) times daily. Mix with equal amounts of hydrocortisone cream, Disp: 30 g, Rfl: 0 .  omeprazole (PRILOSEC) 20 MG capsule, Take 20 mg by mouth daily as needed., Disp: , Rfl:  .  Probiotic CAPS, Take by mouth., Disp: , Rfl:  .  ondansetron (ZOFRAN-ODT) 4 MG disintegrating tablet, Take 1 tablet (4 mg total) by mouth every 6 (six) hours as needed for nausea. (Patient not taking: Reported on 03/21/2017), Disp: 20 tablet, Rfl: 0  Review of Systems  Constitutional: Negative for appetite change, chills, fatigue and fever.  Respiratory: Negative for chest  tightness and shortness of breath.   Cardiovascular: Negative for chest pain and palpitations.  Gastrointestinal: Negative for abdominal pain, nausea and vomiting.  Neurological: Negative for dizziness and weakness.    Social History   Tobacco Use  . Smoking status: Never Smoker  . Smokeless tobacco: Never Used  Substance Use Topics  . Alcohol use: No    Alcohol/week: 0.0 oz   Objective:   BP 124/88 (BP Location: Right Arm, Patient Position: Sitting, Cuff Size: Normal)   Pulse 63   Temp 97.9 F (36.6 C) (Oral)   Resp 16   Wt 160 lb (72.6 kg)   SpO2 98%   BMI 28.34 kg/m  Vitals:   03/21/17 1509  BP: 124/88  Pulse: 63  Resp: 16  Temp: 97.9 F (36.6 C)  TempSrc: Oral  SpO2: 98%  Weight: 160 lb (72.6 kg)     Physical Exam  General Appearance:    Alert, cooperative, no distress  Eyes:    PERRL, conjunctiva/corneas clear, EOM's intact       Lungs:     Clear to auscultation bilaterally, respirations unlabored  Heart:    Regular rate and rhythm  Abdomen:   bowel sounds present and normal in all 4 quadrants, soft, round, nontender or nondistended. No CVA tenderness        Assessment & Plan:     1. Small bowel obstruction ([place]) Now resolved, but she provides a history suspicious for recurrent episodes which usually resolve without intervention.  She states she liked Dr. Hampton Abbot, but she would really like to get a second opinion regarding surgical evaluation and treatment.  - Ambulatory referral to General Surgery  2. Panic disorder Doing much better since she restarted alprazolam. In light of current health concernes, I think she would be off continuing for the time being.       Lelon Huh, MD  [facility]

## 2017-03-24 ENCOUNTER — Encounter: Payer: Self-pay | Admitting: *Deleted

## 2017-03-25 ENCOUNTER — Encounter: Payer: Self-pay | Admitting: *Deleted

## 2017-04-02 ENCOUNTER — Telehealth: Payer: Self-pay | Admitting: Family Medicine

## 2017-04-02 NOTE — Telephone Encounter (Signed)
Please advise 

## 2017-04-02 NOTE — Telephone Encounter (Signed)
Advised patient as below.  

## 2017-04-02 NOTE — Telephone Encounter (Signed)
As long as she has not had any stomach problems or constipation, then she can start introducing small amounts of meats into diet.

## 2017-04-02 NOTE — Telephone Encounter (Signed)
Patient states that she was in for a office visit on 03/21/2017 and you put her on a soft food diet and no meats.  She would like to know if she needs to continue this diet or if she can have meat and regular foods now.  Please advise.

## 2017-04-03 ENCOUNTER — Encounter: Payer: Self-pay | Admitting: Family Medicine

## 2017-04-03 ENCOUNTER — Ambulatory Visit (INDEPENDENT_AMBULATORY_CARE_PROVIDER_SITE_OTHER): Payer: Medicare Other | Admitting: Family Medicine

## 2017-04-03 ENCOUNTER — Telehealth: Payer: Self-pay | Admitting: Family Medicine

## 2017-04-03 VITALS — BP 140/88 | HR 83 | Temp 97.9°F | Resp 16 | Wt 158.0 lb

## 2017-04-03 DIAGNOSIS — R059 Cough, unspecified: Secondary | ICD-10-CM

## 2017-04-03 DIAGNOSIS — J0110 Acute frontal sinusitis, unspecified: Secondary | ICD-10-CM | POA: Diagnosis not present

## 2017-04-03 DIAGNOSIS — R05 Cough: Secondary | ICD-10-CM

## 2017-04-03 MED ORDER — HYDROCODONE-HOMATROPINE 5-1.5 MG/5ML PO SYRP: 5.0000 mL | ORAL_SOLUTION | Freq: Three times a day (TID) | ORAL | 0 refills | Status: DC | PRN

## 2017-04-03 MED ORDER — FLUCONAZOLE 150 MG PO TABS: 150.0000 mg | ORAL_TABLET | Freq: Once | ORAL | 0 refills | Status: AC

## 2017-04-03 MED ORDER — AMOXICILLIN-POT CLAVULANATE 875-125 MG PO TABS: 1.0000 | ORAL_TABLET | Freq: Two times a day (BID) | ORAL | 0 refills | Status: DC

## 2017-04-03 NOTE — Telephone Encounter (Signed)
Patient states that she was in for a office visit today and you gave her a antibiotic and did not give her fluconazole (DIFLUCAN).  She states that she has to have this with a antibiotic.  She uses [facility].

## 2017-04-03 NOTE — Telephone Encounter (Signed)
Please advise 

## 2017-04-03 NOTE — Progress Notes (Signed)
Patient: [name] Female    DOB: 02/17/1946   73 y.o.   MRN: [id number] Visit Date: 04/03/2017  Today's Provider: Lelon Huh, MD   Chief Complaint  Patient presents with  . Cough   Subjective:    Cough  This is a new problem. Episode onset: 3-4 days ago. The problem has been gradually worsening. The cough is productive of sputum (green colored). Associated symptoms include chills, headaches and postnasal drip. Pertinent negatives include no chest pain, fever, rhinorrhea or shortness of breath. Treatments tried: Robutussin. The treatment provided no relief.  cough is worse at night.  Feels likes previous sinus infections with tenderness in her sinuses and nasal congestion with yellow and green discharge.      Allergies  Allergen Reactions  . Levofloxacin     Tongue and mouth swelling  . Calcium Channel Blockers     Other reaction(s): Dizziness  . Amoxicillin Other (See Comments)    Has patient had a PCN reaction causing immediate rash, facial/tongue/throat swelling, SOB or lightheadedness with hypotension: No Has patient had a PCN reaction causing severe rash involving mucus membranes or skin necrosis: No Has patient had a PCN reaction that required hospitalization: No Has patient had a PCN reaction occurring within the last 10 years: No If all of the above answers are "NO", then may proceed with Cephalosporin use.   . Nickel Rash  . Penicillins Other (See Comments)    Has patient had a PCN reaction causing immediate rash, facial/tongue/throat swelling, SOB or lightheadedness with hypotension: No Has patient had a PCN reaction causing severe rash involving mucus membranes or skin necrosis: No Has patient had a PCN reaction that required hospitalization: No Has patient had a PCN reaction occurring within the last 10 years: No If all of the above answers are "NO", then may proceed with Cephalosporin use.     Current Outpatient Medications:  .  ALPRAZolam  (XANAX) 0.25 MG tablet, 1/2 - 1 tablet every evening, Disp: 30 tablet, Rfl: 3 .  aspirin 81 MG tablet, Take 81 mg by mouth daily. , Disp: , Rfl:  .  citalopram (CELEXA) 20 MG tablet, Take 1 tablet (20 mg total) daily by mouth. (Patient taking differently: Take 10 mg by mouth daily. ), Disp: 90 tablet, Rfl: 4 .  nystatin cream (MYCOSTATIN), Apply 1 application topically 2 (two) times daily. Mix with equal amounts of hydrocortisone cream, Disp: 30 g, Rfl: 0 .  omeprazole (PRILOSEC) 20 MG capsule, Take 20 mg by mouth daily as needed., Disp: , Rfl:  .  ondansetron (ZOFRAN-ODT) 4 MG disintegrating tablet, Take 1 tablet (4 mg total) by mouth every 6 (six) hours as needed for nausea., Disp: 20 tablet, Rfl: 0 .  Probiotic CAPS, Take by mouth., Disp: , Rfl:   Review of Systems  Constitutional: Positive for chills and fatigue. Negative for appetite change and fever.  HENT: Positive for congestion (nasal congestion), postnasal drip, sinus pressure, sinus pain and sneezing. Negative for rhinorrhea.   Respiratory: Positive for cough (productive) and chest tightness. Negative for shortness of breath.   Cardiovascular: Negative for chest pain and palpitations.  Gastrointestinal: Negative for abdominal pain, nausea and vomiting.  Neurological: Positive for headaches. Negative for dizziness and weakness.    Social History   Tobacco Use  . Smoking status: Never Smoker  . Smokeless tobacco: Never Used  Substance Use Topics  . Alcohol use: No    Alcohol/week: 0.0 oz   Objective:  BP 140/88 (BP Location: Left Arm, Patient Position: Sitting, Cuff Size: Normal)   Pulse 83   Temp 97.9 F (36.6 C) (Oral)   Resp 16   Wt 158 lb (71.7 kg)   SpO2 95% Comment: room air  BMI 27.99 kg/m  There were no vitals filed for this visit.   Physical Exam  General Appearance:    Alert, cooperative, no distress  HENT:   bilateral TM normal without fluid or infection, neck without nodes, frontal  sinuses tender and  nasal mucosa congested  Eyes:    PERRL, conjunctiva/corneas clear, EOM's intact       Lungs:     Clear to auscultation bilaterally, respirations unlabored  Heart:    Regular rate and rhythm  Neurologic:   Awake, alert, oriented x 3. No apparent focal neurological           defect.           Assessment & Plan:     1. Cough  - HYDROcodone-homatropine (HYCODAN) 5-1.5 MG/5ML syrup; Take 5 mLs by mouth every 8 (eight) hours as needed.  Dispense: 100 mL; Refill: 0  2. Acute frontal sinusitis, recurrence not specified  - amoxicillin-clavulanate (AUGMENTIN) 875-125 MG tablet; Take 1 tablet by mouth 2 (two) times daily for 7 days.  Dispense: 14 tablet; Refill: 0       Lelon Huh, MD  [facility]

## 2017-04-08 ENCOUNTER — Encounter: Payer: Self-pay | Admitting: General Surgery

## 2017-04-08 ENCOUNTER — Ambulatory Visit (INDEPENDENT_AMBULATORY_CARE_PROVIDER_SITE_OTHER): Payer: Medicare Other | Admitting: General Surgery

## 2017-04-08 VITALS — BP 132/74 | HR 104 | Resp 14 | Ht 63.0 in | Wt 154.0 lb

## 2017-04-08 DIAGNOSIS — H2513 Age-related nuclear cataract, bilateral: Secondary | ICD-10-CM | POA: Diagnosis not present

## 2017-04-08 DIAGNOSIS — H5203 Hypermetropia, bilateral: Secondary | ICD-10-CM | POA: Diagnosis not present

## 2017-04-08 DIAGNOSIS — R1084 Generalized abdominal pain: Secondary | ICD-10-CM

## 2017-04-08 NOTE — Progress Notes (Signed)
Patient ID: [name], female   DOB: 1945-09-03, 72 y.o.   MRN: [id number]  Chief Complaint  Patient presents with  . Other    HPI [name] is a 72 y.o. female.  Here for second opinion and evaluation of abdominal discomfort.  She states she does have constipation and lots of gas. Bowels move about once a day but before she she got sick and was in the hospital (January) they moved 2-3 times a day.  She states that while she was in the hospital she was suppose to have surgery but that Dr Aleen Campi told her she didn't need surgery. She states that since her colon surgery she has had 4 spells where she gets diaphoretic she passes out when she gets constipated before having a BM. She admits to occasional abdominal distention not always associated with the "spells". CT was 03-13-17. She has been told that she had scar tissue on her colon. She is currently on Augmentin for a sinus infection. She is here with her husband of 50 years, [name].  HPI  Past Medical History:  Diagnosis Date  . Anxiety   . Arthritis    lower spine  . Back pain    lower back - S/P lifting injury  . Complication of anesthesia    makes hair brittle  . GERD (gastroesophageal reflux disease)   . Hyperlipidemia   . Hypertension   . Neuromuscular disorder (HCC)    numbness legs and feet s/p lower back injury  . Stroke [facility])    "mini - strokes" 2009 - no deficit  . Vertigo    none recently  . Vitamin D deficiency   . Wears contact lenses     Past Surgical History:  Procedure Laterality Date  . ABDOMINAL HYSTERECTOMY  1987  . CARDIAC CATHETERIZATION  02/2007  . COLON SURGERY  03/12/2012   Dr Cecelia Byars  . COLONOSCOPY WITH PROPOFOL N/A 09/26/2014   Procedure: COLONOSCOPY WITH PROPOFOL;  Surgeon: Midge Minium, MD;  Location: [facility];  Service: Endoscopy;  Laterality: N/A;  marker (tattoo) used in colon  . ESOPHAGEAL DILATION N/A 02/02/2016   Procedure: ESOPHAGEAL DILATION;  Surgeon:  Midge Minium, MD;  Location: [facility];  Service: Endoscopy;  Laterality: N/A;  . ESOPHAGOGASTRODUODENOSCOPY (EGD) WITH PROPOFOL N/A 02/02/2016   Procedure: ESOPHAGOGASTRODUODENOSCOPY (EGD) WITH PROPOFOL;  Surgeon: Midge Minium, MD;  Location: [facility];  Service: Endoscopy;  Laterality: N/A;  . POLYPECTOMY  09/26/2014   Procedure: POLYPECTOMY;  Surgeon: Midge Minium, MD;  Location: [facility];  Service: Endoscopy;;  . TUBAL LIGATION  1972    Family History  Problem Relation Age of Onset  . Diabetes Mother   . Heart disease Mother   . Hypertension Mother   . Mental illness Mother   . Cancer Father        lung cancer  . Drug abuse Brother   . Multiple sclerosis Brother     Social History Social History   Tobacco Use  . Smoking status: Never Smoker  . Smokeless tobacco: Never Used  Substance Use Topics  . Alcohol use: No    Alcohol/week: 0.0 oz  . Drug use: No    Allergies  Allergen Reactions  . Levofloxacin     Tongue and mouth swelling  . Calcium Channel Blockers     Other reaction(s): Dizziness  . Amoxicillin Other (See Comments)    Has patient had a PCN reaction causing immediate rash, facial/tongue/throat swelling, SOB  or lightheadedness with hypotension: No Has patient had a PCN reaction causing severe rash involving mucus membranes or skin necrosis: No Has patient had a PCN reaction that required hospitalization: No Has patient had a PCN reaction occurring within the last 10 years: No If all of the above answers are "NO", then may proceed with Cephalosporin use.   . Nickel Rash  . Penicillins Other (See Comments)    Has patient had a PCN reaction causing immediate rash, facial/tongue/throat swelling, SOB or lightheadedness with hypotension: No Has patient had a PCN reaction causing severe rash involving mucus membranes or skin necrosis: No Has patient had a PCN reaction that required hospitalization: No Has patient had a PCN reaction  occurring within the last 10 years: No If all of the above answers are "NO", then may proceed with Cephalosporin use.    Current Outpatient Medications  Medication Sig Dispense Refill  . ALPRAZolam (XANAX) 0.25 MG tablet 1/2 - 1 tablet every evening 30 tablet 3  . amoxicillin-clavulanate (AUGMENTIN) 875-125 MG tablet Take 1 tablet by mouth 2 (two) times daily for 7 days. 14 tablet 0  . aspirin 81 MG tablet Take 81 mg by mouth daily.     . citalopram (CELEXA) 20 MG tablet Take 1 tablet (20 mg total) daily by mouth. (Patient taking differently: Take 10 mg by mouth daily. ) 90 tablet 4  . HYDROcodone-homatropine (HYCODAN) 5-1.5 MG/5ML syrup Take 5 mLs by mouth every 8 (eight) hours as needed. 100 mL 0  . nystatin cream (MYCOSTATIN) Apply 1 application topically 2 (two) times daily. Mix with equal amounts of hydrocortisone cream 30 g 0  . omeprazole (PRILOSEC) 20 MG capsule Take 20 mg by mouth daily as needed.    . ondansetron (ZOFRAN-ODT) 4 MG disintegrating tablet Take 1 tablet (4 mg total) by mouth every 6 (six) hours as needed for nausea. 20 tablet 0  . Polyethylene Glycol 3350 (MIRALAX PO) Take by mouth as needed.    . Probiotic CAPS Take by mouth.     No current facility-administered medications for this visit.     Review of Systems Review of Systems  Constitutional: Negative.   Respiratory: Negative.   Cardiovascular: Negative.   Gastrointestinal: Positive for nausea. Negative for diarrhea.    Blood pressure 132/74, pulse (!) 104, resp. rate 14, height 5\' 3"  (1.6 m), weight 154 lb (69.9 kg).  Physical Exam Physical Exam  Constitutional: She is oriented to person, place, and time. She appears well-developed and well-nourished.  HENT:  Mouth/Throat: Oropharynx is clear and moist.  Eyes: Conjunctivae are normal. No scleral icterus.  Neck: Neck supple.  Cardiovascular: Normal rate, regular rhythm, normal heart sounds and intact distal pulses.  Pulses:      Femoral pulses are 2+  on the right side, and 2+ on the left side. No lower leg edema  Pulmonary/Chest: Effort normal and breath sounds normal.  Abdominal: Soft. Normal appearance and bowel sounds are normal. There is no hepatomegaly. There is no tenderness. No hernia. Hernia confirmed negative in the right inguinal area and confirmed negative in the left inguinal area.    Lymphadenopathy:    She has no cervical adenopathy.  Neurological: She is alert and oriented to person, place, and time.  Skin: Skin is warm and dry.  Psychiatric: Her behavior is normal.    Data Reviewed Imaging studies related to her January 24-27, 2019 hospital admission reviewed.  ED CT scan of March 02, 2017 reviewed.  Negative For acute colonic pathology.  No evidence of small bowel obstruction.  September 26, 2014 colonoscopy completed by Midge Minium, MD: Colon, polyp(s), transverse - POLYPOID, INFLAMED GRANULATION TISSUE. - NO ADENOMATOUS CHANGE OR MALIGNANCY IDENTIFIED.  March 12, 2012 transverse colectomy for polyps: Diagnosis:  Part A: TRANSVERSE COLON, PARTIAL TRANSVERSE COLECTOMY:  - TUBULAR ADENOMA, 0.6 CM.  - NEGATIVE FOR HIGH GRADE DYSPLASIA AND MALIGNANCY.  - THE MARGINS OF RESECTION ARE NEGATIVE, SEE SUMMARY BELOW.  [name]  CANCER CASE SUMMARY: COLON RESECTION  .  SPECIMEN: Transverse colon  PROCEDURE: Partial transverse colectomy  TUMOR SITE: Transverse colon  TUMOR SIZE: 0.6 cm  MACROSCOPIC TUMOR PERFORATION: Not specified  HISTOLOGIC TYPE: Tubular adenoma  HISTOLOGIC GRADE: Not applicable  MICROSCOPIC TUMOR EXTENSION: Not applicable  MARGINS: All margins are uninvolved  TREATMENT EFFECT: No prior treatment  LYMPH-VASCULAR INVASION: Not applicable  PERINEURAL INVASION: Not applicable  LYMPH NODES: Number of lymph nodes examined: 10 (combined parts  A/B)        Number of lymph nodes involved: 0  PATHOLOGIC STAGING:    Primary tumor - pT0    Regional lymph nodes - pN0    Metastasis - not  applicable  .  Part B: DISTAL TRANSVERSE COLON, PARTIAL TRANSVERSE COLECTOMY:  - BENIGN COLONIC MUCOSA.  - THE MARGINS OF RESECTION ARE NEGATIVE.  - THREE BENIGN LYMPH NODES (0/3).  .  March 14, 2017 CBC results showed a hemoglobin of 13.8 with an MCV of 91, white blood cell count of 6100.  Normal differential.  Platelet count of 220,000. Basic metabolic panel of March 15, 2017 notable for a modestly depressed serum potassium of 3.1.  Normal renal function.  Creatinine 0.87.  Hospital records related to her March 13, 2017 through March 16, 2017 admission for a partial small bowel obstruction reviewed.  No evidence of dilatation proximal to the colocolonic anastomosis.  Several modestly dilated loops of distal ileum.  Review of the record showed that the patient was made n.p.o., treated with IV hydration with resolution of her symptoms.    Assessment    Recent episode of partial small bowel obstruction with spontaneous resolution.    Plan    The patient was concerned because she was scheduled for surgery, but it was canceled.  I relayed to her that this was likely a "reservation" in the OR in the event that she did not improve in a timely fashion, when she did improve, surgical intervention was not necessary.  The patient reports episodic spells where she will feel hot flushed and occasionally bloated, usually relieved by the passage of stool.  Her most recent episode which was reported to include a syncopal spell included a long sit on the commode, explosive diarrhea and then various maneuvers to look from the seated position into the toilet bowl likely aggravating a vasovagal response.  She has had good, easy, daily elimination since beginning MiraLAX daily.  Continued use of this medication is encouraged.   Follow up in 6 weeks or sooner she she has symptoms     HPI, Physical Exam, Assessment and Plan have been scribed under the direction and in the presence of Earline Mayotte, MD. Wanda Daft, RN  I have completed the exam and reviewed the above documentation for accuracy and completeness.  I agree with the above.  Museum/gallery conservator has been used and any errors in dictation or transcription are unintentional.  Donnalee Curry, M.D., F.A.C.S.  [name] 04/08/2017, 8:45 PM

## 2017-04-08 NOTE — Patient Instructions (Addendum)
Encourage miralax use daily. Follow up in 6 weeks or sooner she she has symptoms

## 2017-04-09 ENCOUNTER — Telehealth: Payer: Self-pay | Admitting: Family Medicine

## 2017-04-09 MED ORDER — FLUCONAZOLE 150 MG PO TABS: 150.0000 mg | ORAL_TABLET | Freq: Once | ORAL | 0 refills | Status: AC

## 2017-04-09 NOTE — Telephone Encounter (Signed)
Please advise 

## 2017-04-09 NOTE — Telephone Encounter (Signed)
none

## 2017-04-09 NOTE — Telephone Encounter (Signed)
Pt contacted office for refill request on the following medications:  fluconazole (DIFLUCAN) 150 MG tablet  [facility]  Pt stated that she finished taking amoxicillin-clavulanate (AUGMENTIN) 875-125 MG tablet yesterday 04/08/17 and she believes the medication caused her to have a yeast infection and is requesting an Rx for Diflucan. Please advise. Thanks TNP

## 2017-04-15 ENCOUNTER — Telehealth: Payer: Self-pay | Admitting: General Surgery

## 2017-04-15 NOTE — Telephone Encounter (Signed)
Patient came in on 04-08-17 for a second opinion for SBO.She ask for Rosann Auerbach to return her call. She has a few more question about what was said in the visit.It was a little to much for her to take in.Please call.

## 2017-04-15 NOTE — Telephone Encounter (Signed)
She states she has had two episodes since last visit. Normal BM was Thursday. She states she has not been sitting on the toilet like Dr Bary Castilla suggested. She had a smaller BM on Saturday and then she went and layed down in the bed-got better in 2-3 hours. She states that with the first episode she had not had a BM went felt herself falling over and caught herself. Dr Bary Castilla aware and recommends her seeing Dr [name] for further evaluation of these episodes. Appreciates call and agrees.

## 2017-04-29 ENCOUNTER — Ambulatory Visit (INDEPENDENT_AMBULATORY_CARE_PROVIDER_SITE_OTHER): Payer: Medicare Other | Admitting: Family Medicine

## 2017-04-29 ENCOUNTER — Encounter: Payer: Self-pay | Admitting: Family Medicine

## 2017-04-29 VITALS — BP 138/80 | HR 75 | Temp 98.0°F | Resp 16

## 2017-04-29 DIAGNOSIS — R0789 Other chest pain: Secondary | ICD-10-CM | POA: Diagnosis not present

## 2017-04-29 DIAGNOSIS — F419 Anxiety disorder, unspecified: Secondary | ICD-10-CM | POA: Diagnosis not present

## 2017-04-29 DIAGNOSIS — E785 Hyperlipidemia, unspecified: Secondary | ICD-10-CM

## 2017-04-29 MED ORDER — ALPRAZOLAM 0.25 MG PO TABS: ORAL_TABLET | ORAL | 3 refills | Status: DC

## 2017-04-29 NOTE — Progress Notes (Signed)
Patient: [name] Female    DOB: 16-Oct-1945   72 y.o.   MRN: [id number] Visit Date: 04/29/2017  Today's Provider: Lelon Huh, MD   Chief Complaint  Patient presents with  . Hypertension   Subjective:    HPI   Patient reports she was having a slight headache and mild pressure on the left side of her her chest this morning after feeding her dogs. It resolved after a few minutes and has not returned.. She checked her blood pressure and it was 148/91. Patient states for the past week she has been having fatigue, headache, and some pressure in chest. No dyspnea or palpitations. She had negative Myoview in 2014.  Wt Readings from Last 5 Encounters:  04/08/17 154 lb (69.9 kg)  04/03/17 158 lb (71.7 kg)  03/21/17 160 lb (72.6 kg)  03/14/17 171 lb 8.3 oz (77.8 kg)  03/03/17 160 lb (72.6 kg)      Allergies  Allergen Reactions  . Levofloxacin     Tongue and mouth swelling  . Calcium Channel Blockers     Other reaction(s): Dizziness  . Amoxicillin Other (See Comments)    Has patient had a PCN reaction causing immediate rash, facial/tongue/throat swelling, SOB or lightheadedness with hypotension: No Has patient had a PCN reaction causing severe rash involving mucus membranes or skin necrosis: No Has patient had a PCN reaction that required hospitalization: No Has patient had a PCN reaction occurring within the last 10 years: No If all of the above answers are "NO", then may proceed with Cephalosporin use.   . Nickel Rash  . Penicillins Other (See Comments)    Has patient had a PCN reaction causing immediate rash, facial/tongue/throat swelling, SOB or lightheadedness with hypotension: No Has patient had a PCN reaction causing severe rash involving mucus membranes or skin necrosis: No Has patient had a PCN reaction that required hospitalization: No Has patient had a PCN reaction occurring within the last 10 years: No If all of the above answers are "NO", then may  proceed with Cephalosporin use.     Current Outpatient Medications:  .  ALPRAZolam (XANAX) 0.25 MG tablet, 1/2 - 1 tablet every evening, Disp: 30 tablet, Rfl: 3 .  aspirin 81 MG tablet, Take 81 mg by mouth daily. , Disp: , Rfl:  .  citalopram (CELEXA) 20 MG tablet, Take 1 tablet (20 mg total) daily by mouth. (Patient taking differently: Take 10 mg by mouth daily. ), Disp: 90 tablet, Rfl: 4 .  HYDROcodone-homatropine (HYCODAN) 5-1.5 MG/5ML syrup, Take 5 mLs by mouth every 8 (eight) hours as needed., Disp: 100 mL, Rfl: 0 .  nystatin cream (MYCOSTATIN), Apply 1 application topically 2 (two) times daily. Mix with equal amounts of hydrocortisone cream, Disp: 30 g, Rfl: 0 .  omeprazole (PRILOSEC) 20 MG capsule, Take 20 mg by mouth daily as needed., Disp: , Rfl:  .  ondansetron (ZOFRAN-ODT) 4 MG disintegrating tablet, Take 1 tablet (4 mg total) by mouth every 6 (six) hours as needed for nausea., Disp: 20 tablet, Rfl: 0 .  Polyethylene Glycol 3350 (MIRALAX PO), Take by mouth as needed., Disp: , Rfl:  .  Probiotic CAPS, Take by mouth., Disp: , Rfl:   Review of Systems  Constitutional: Negative for appetite change, chills, fatigue and fever.  Respiratory: Negative for chest tightness and shortness of breath.   Cardiovascular: Negative for chest pain and palpitations.  Gastrointestinal: Negative for abdominal pain, nausea and vomiting.  Neurological: Negative  for dizziness and weakness.    Social History   Tobacco Use  . Smoking status: Never Smoker  . Smokeless tobacco: Never Used  Substance Use Topics  . Alcohol use: No    Alcohol/week: 0.0 oz   Objective:    Vitals:   04/29/17 1503 04/29/17 1530  BP: 120/74 138/80  Pulse: 75   Resp: 16   Temp: 98 F (36.7 C)   TempSrc: Oral   SpO2: 98%      Physical Exam   General Appearance:    Alert, cooperative, no distress  Eyes:    PERRL, conjunctiva/corneas clear, EOM's intact       Lungs:     Clear to auscultation bilaterally,  respirations unlabored  Heart:    Regular rate and rhythm  Neurologic:   Awake, alert, oriented x 3. No apparent focal neurological           defect.       EKG: NSR    Assessment & Plan:     1. Chest pressure Not typical cardiac angina and had negative Myoview in 2014, but she does have cardiac risk factors. She Is already on ECASA. Pain now completely resolved.- EKG 12-Lead - Troponin I  2. Anxiety She states that the alprazolam that was dispensed in January is not working for her at all and she only took a couple. At her January visit she wanted to wean medication and was prescription was changed from 0.5 to 0.25 which was dispensed on 03/03/2017. However it was changed back 0.5mg  on 03/09/2017. She does not know which of these is the once she had problems with but she thought it was the most recent one (which was 0.5mg )  She did not bring her medications with her today so I do not know which prescription she is talking about. I advised her to take the medication to her pharmacist and show them which tablet she had problems with.  - ALPRAZolam (XANAX) 0.25 MG tablet; 1/2 - 1 tablet every evening  Dispense: 30 tablet; Refill: 3  3. Hyperlipidemia, unspecified hyperlipidemia type  - Direct LDL       Lelon Huh, MD  [facility]

## 2017-04-30 ENCOUNTER — Telehealth: Payer: Self-pay | Admitting: *Deleted

## 2017-04-30 DIAGNOSIS — R079 Chest pain, unspecified: Secondary | ICD-10-CM

## 2017-04-30 LAB — TROPONIN I: Troponin I: <0.01 ng/mL (ref 0.00–0.04)

## 2017-04-30 LAB — LDL CHOLESTEROL, DIRECT: LDL Direct: 217 mg/dL — ABNORMAL HIGH (ref 0–99)

## 2017-04-30 MED ORDER — ATORVASTATIN CALCIUM 20 MG PO TABS
20.0000 mg | ORAL_TABLET | Freq: Every day | ORAL | 3 refills | Status: DC
Start: 2017-04-30 — End: 2017-06-30

## 2017-04-30 NOTE — Telephone Encounter (Signed)
Patient was notified of results. Expressed understanding. Rx sent to pharmacy. Referral in epic.

## 2017-04-30 NOTE — Telephone Encounter (Signed)
Please schedule referral to cardiology. Thanks!

## 2017-04-30 NOTE — Telephone Encounter (Signed)
-----   Message from Birdie Sons, MD sent at 04/30/2017  1:44 PM EDT ----- Very high LDL cholesterol if 217, should be under 100. High risk for cardiac disease and strokes. Need to start atorvastatin 20mg  once a day, #30, rf x 3 Need referral to cardiology for chest pain.  Schedule follow up here in 1 month.

## 2017-05-05 DIAGNOSIS — R5383 Other fatigue: Secondary | ICD-10-CM | POA: Diagnosis not present

## 2017-05-05 DIAGNOSIS — R079 Chest pain, unspecified: Secondary | ICD-10-CM | POA: Diagnosis not present

## 2017-05-05 DIAGNOSIS — E7849 Other hyperlipidemia: Secondary | ICD-10-CM | POA: Diagnosis not present

## 2017-05-13 DIAGNOSIS — R5383 Other fatigue: Secondary | ICD-10-CM | POA: Diagnosis not present

## 2017-05-13 DIAGNOSIS — R079 Chest pain, unspecified: Secondary | ICD-10-CM | POA: Diagnosis not present

## 2017-05-20 ENCOUNTER — Ambulatory Visit: Payer: Self-pay | Admitting: General Surgery

## 2017-05-22 DIAGNOSIS — R0789 Other chest pain: Secondary | ICD-10-CM | POA: Diagnosis not present

## 2017-05-22 DIAGNOSIS — I700 Atherosclerosis of aorta: Secondary | ICD-10-CM | POA: Diagnosis not present

## 2017-05-22 DIAGNOSIS — E7849 Other hyperlipidemia: Secondary | ICD-10-CM | POA: Diagnosis not present

## 2017-05-30 ENCOUNTER — Ambulatory Visit (INDEPENDENT_AMBULATORY_CARE_PROVIDER_SITE_OTHER): Payer: Medicare Other | Admitting: Family Medicine

## 2017-05-30 ENCOUNTER — Encounter: Payer: Self-pay | Admitting: Family Medicine

## 2017-05-30 VITALS — BP 128/84 | HR 72 | Temp 97.7°F | Resp 16 | Ht 63.0 in | Wt 159.0 lb

## 2017-05-30 DIAGNOSIS — E785 Hyperlipidemia, unspecified: Secondary | ICD-10-CM

## 2017-05-30 DIAGNOSIS — R1013 Epigastric pain: Secondary | ICD-10-CM | POA: Diagnosis not present

## 2017-05-30 NOTE — Progress Notes (Signed)
Patient: [name] Female    DOB: April 14, 1945   72 y.o.   MRN: [id number] Visit Date: 05/30/2017  Today's Provider: Lelon Huh, MD   Chief Complaint  Patient presents with  . Hyperlipidemia   Subjective:    HPI  Lipid/Cholesterol, Follow-up:   Last seen for this 1 months ago.  Management changes since that visit include starting Atorvastatin 20mg  daily. Patient states since the last visit, she was seen by her Cardiologist who increased Atorvastatin to 40mg  daily. She states she has been taking 2 of the 20mg  tablets instead. Denies any adverse effects but frequently forgets to take medication.  . Last Lipid Panel:    Component Value Date/Time   CHOL 235 (H) 06/18/2012 1415   TRIG 96 06/18/2012 1415   HDL 47 06/18/2012 1415   VLDL 19 06/18/2012 1415   LDLCALC 169 (H) 06/18/2012 1415   LDLDIRECT 217 (H) 04/29/2017 1559    She states she has been having episodes of epigastric pains off and of for a few months. She states cardiologist thought it might be related to diverticulosis which was seen on colonoscopy. Pain is a little worse after eating.  -------------------------------------------------------------------     Allergies  Allergen Reactions  . Levofloxacin     Tongue and mouth swelling  . Calcium Channel Blockers     Other reaction(s): Dizziness  . Amoxicillin Other (See Comments)    Has patient had a PCN reaction causing immediate rash, facial/tongue/throat swelling, SOB or lightheadedness with hypotension: No Has patient had a PCN reaction causing severe rash involving mucus membranes or skin necrosis: No Has patient had a PCN reaction that required hospitalization: No Has patient had a PCN reaction occurring within the last 10 years: No If all of the above answers are "NO", then may proceed with Cephalosporin use.   . Nickel Rash  . Penicillins Other (See Comments)    Has patient had a PCN reaction causing immediate rash, facial/tongue/throat  swelling, SOB or lightheadedness with hypotension: No Has patient had a PCN reaction causing severe rash involving mucus membranes or skin necrosis: No Has patient had a PCN reaction that required hospitalization: No Has patient had a PCN reaction occurring within the last 10 years: No If all of the above answers are "NO", then may proceed with Cephalosporin use.     Current Outpatient Medications:  .  ALPRAZolam (XANAX) 0.25 MG tablet, 1/2 - 1 tablet every evening, Disp: 30 tablet, Rfl: 3 .  aspirin 81 MG tablet, Take 81 mg by mouth daily. , Disp: , Rfl:  .  atorvastatin (LIPITOR) 20 MG tablet, Take 1 tablet (20 mg total) by mouth daily. (Patient taking differently: Take 40 mg by mouth daily. ), Disp: 30 tablet, Rfl: 3 .  citalopram (CELEXA) 20 MG tablet, Take 1 tablet (20 mg total) daily by mouth. (Patient taking differently: Take 10 mg by mouth daily. ), Disp: 90 tablet, Rfl: 4 .  HYDROcodone-homatropine (HYCODAN) 5-1.5 MG/5ML syrup, Take 5 mLs by mouth every 8 (eight) hours as needed., Disp: 100 mL, Rfl: 0 .  nystatin cream (MYCOSTATIN), Apply 1 application topically 2 (two) times daily. Mix with equal amounts of hydrocortisone cream, Disp: 30 g, Rfl: 0 .  omeprazole (PRILOSEC) 20 MG capsule, Take 20 mg by mouth daily as needed., Disp: , Rfl:  .  ondansetron (ZOFRAN-ODT) 4 MG disintegrating tablet, Take 1 tablet (4 mg total) by mouth every 6 (six) hours as needed for nausea., Disp: 20  tablet, Rfl: 0 .  Polyethylene Glycol 3350 (MIRALAX PO), Take by mouth as needed., Disp: , Rfl:  .  Probiotic CAPS, Take by mouth., Disp: , Rfl:   Review of Systems  Constitutional: Negative for appetite change, chills, fatigue and fever.  Respiratory: Negative for chest tightness and shortness of breath.   Cardiovascular: Negative for chest pain and palpitations.  Gastrointestinal: Positive for constipation (some changes in bowels; some days will have small round balls). Negative for abdominal pain, nausea  and vomiting.       Discomfort in the abdomen  Neurological: Negative for dizziness and weakness.    Social History   Tobacco Use  . Smoking status: Never Smoker  . Smokeless tobacco: Never Used  Substance Use Topics  . Alcohol use: No    Alcohol/week: 0.0 oz   Objective:   BP 128/84 (BP Location: Right Arm, Patient Position: Sitting, Cuff Size: Normal)   Pulse 72   Temp 97.7 F (36.5 C) (Oral)   Resp 16   Ht 5\' 3"  (1.6 m)   Wt 159 lb (72.1 kg)   SpO2 97% Comment: room air  BMI 28.17 kg/m     Physical Exam  General appearance: alert, well developed, well nourished, cooperative and in no distress Head: Normocephalic, without obvious abnormality, atraumatic Respiratory: Respirations even and unlabored, normal respiratory rate Extremities: No gross deformities Skin: Skin color, texture, turgor normal. No rashes seen  Psych: Appropriate mood and affect. Neurologic: Mental status: Alert, oriented to person, place, and time, thought content appropriate.     Assessment & Plan:     1. Epigastric pain  - H. pylori breath test  2. Hyperlipidemia, unspecified hyperlipidemia type Tolerating atorvastatin, although not taking consistently. Reinforced importance of taking consistently. Anticipate check lipids in 3-4 weeks.        Lelon Huh, MD  [facility]

## 2017-06-01 LAB — H. PYLORI BREATH TEST: H pylori Breath Test: NEGATIVE

## 2017-06-02 ENCOUNTER — Telehealth: Payer: Self-pay | Admitting: Emergency Medicine

## 2017-06-02 NOTE — Telephone Encounter (Signed)
-----   Message from Birdie Sons, MD sent at 06/01/2017  7:31 PM EDT ----- H. Pylori test is negative. Symptoms are likely due to acid reflux. Recommend referral to GI for follow up.

## 2017-06-02 NOTE — Telephone Encounter (Signed)
Pt advised.

## 2017-06-11 ENCOUNTER — Encounter: Payer: Self-pay | Admitting: *Deleted

## 2017-06-20 ENCOUNTER — Other Ambulatory Visit: Payer: Self-pay | Admitting: Family Medicine

## 2017-06-20 NOTE — Telephone Encounter (Signed)
Pharmacy requesting refills. Thanks!  

## 2017-06-24 ENCOUNTER — Telehealth: Payer: Self-pay | Admitting: Family Medicine

## 2017-06-24 DIAGNOSIS — E782 Mixed hyperlipidemia: Secondary | ICD-10-CM

## 2017-06-24 NOTE — Telephone Encounter (Signed)
Please advise patient it is time to check lipids since starting atorvastatin in April. Please print and leave lab order for her at lab. She needs to be fasting.

## 2017-06-24 NOTE — Telephone Encounter (Signed)
-----   Message from Birdie Sons, MD sent at 05/30/2017  1:01 PM EDT ----- Regarding: recheck lipids early may Started atorvastatin in march

## 2017-06-24 NOTE — Telephone Encounter (Signed)
Done. Patient was advised. 

## 2017-06-26 ENCOUNTER — Telehealth: Payer: Self-pay | Admitting: Emergency Medicine

## 2017-06-26 NOTE — Telephone Encounter (Signed)
Error

## 2017-06-27 DIAGNOSIS — E782 Mixed hyperlipidemia: Secondary | ICD-10-CM | POA: Diagnosis not present

## 2017-06-28 LAB — COMPREHENSIVE METABOLIC PANEL
ALT: 12 IU/L (ref 0–32)
AST: 19 IU/L (ref 0–40)
Albumin/Globulin Ratio: 1.7 (ref 1.2–2.2)
Albumin: 4.3 g/dL (ref 3.6–4.8)
Alkaline Phosphatase: 62 IU/L (ref 39–117)
BUN/Creatinine Ratio: 11 — ABNORMAL LOW (ref 12–28)
BUN: 9 mg/dL (ref 8–27)
Bilirubin Total: 1.1 mg/dL (ref 0.0–1.2)
CO2: 22 mmol/L (ref 20–29)
Calcium: 9.2 mg/dL (ref 8.7–10.3)
Chloride: 104 mmol/L (ref 96–106)
Creatinine, Ser: 0.82 mg/dL (ref 0.57–1.00)
GFR calc Af Amer: 84 mL/min/{1.73_m2} (ref >59)
GFR calc non Af Amer: 73 mL/min/{1.73_m2} (ref >59)
Globulin, Total: 2.6 g/dL (ref 1.5–4.5)
Glucose: 86 mg/dL (ref 65–99)
Potassium: 4.3 mmol/L (ref 3.5–5.2)
Sodium: 138 mmol/L (ref 134–144)
Total Protein: 6.9 g/dL (ref 6.0–8.5)

## 2017-06-28 LAB — LIPID PANEL
Chol/HDL Ratio: 4.5 ratio — ABNORMAL HIGH (ref 0.0–4.4)
Cholesterol, Total: 224 mg/dL — ABNORMAL HIGH (ref 100–199)
HDL: 50 mg/dL (ref >39)
LDL Calculated: 164 mg/dL — ABNORMAL HIGH (ref 0–99)
Triglycerides: 52 mg/dL (ref 0–149)
VLDL Cholesterol Cal: 10 mg/dL (ref 5–40)

## 2017-06-30 ENCOUNTER — Telehealth: Payer: Self-pay | Admitting: *Deleted

## 2017-06-30 MED ORDER — ATORVASTATIN CALCIUM 80 MG PO TABS: 80.0000 mg | ORAL_TABLET | Freq: Every day | ORAL | 5 refills | Status: DC

## 2017-06-30 NOTE — Telephone Encounter (Signed)
Patient was notified of results. Expressed understanding. Rx sent to pharmacy. 

## 2017-06-30 NOTE — Telephone Encounter (Signed)
-----   Message from Birdie Sons, MD sent at 06/30/2017  8:52 AM EDT ----- LDL cholesterol improved from 217 to 164, need to be under 130. Need to change dose of atorvastatin to 80mg  once a day, #30, rf x 5 and schedule follow up in 3 months

## 2017-07-22 ENCOUNTER — Ambulatory Visit (INDEPENDENT_AMBULATORY_CARE_PROVIDER_SITE_OTHER): Payer: Medicare Other | Admitting: General Surgery

## 2017-07-22 ENCOUNTER — Encounter: Payer: Self-pay | Admitting: General Surgery

## 2017-07-22 VITALS — BP 122/78 | HR 72 | Resp 14 | Ht 63.0 in | Wt 160.0 lb

## 2017-07-22 DIAGNOSIS — Z8719 Personal history of other diseases of the digestive system: Secondary | ICD-10-CM | POA: Diagnosis not present

## 2017-07-22 NOTE — Patient Instructions (Addendum)
Try different fruits like mangos.  Follow up as needed.

## 2017-07-22 NOTE — Progress Notes (Signed)
Patient ID: [name], female   DOB: 08/08/1945, 72 y.o.   MRN: [id number]  Chief Complaint  Patient presents with  . Follow-up    HPI [name] is a 72 y.o. female here for a follow up from small bowel obstruction in January that resolved spontaneously. She reports that he bowels are doing well. She usually going about 1-2 times a day and they are usually formed.  She report that some acidic foods don't agree with her. She has not does not eat many tomato products, but apples do bother her.  This is true whether or not she peels the apples before eating.  She does complain about an itchy rash in her groin area similar to what she gets under her breasts.   HPI  Past Medical History:  Diagnosis Date  . Anxiety   . Arthritis    lower spine  . Back pain    lower back - S/P lifting injury  . Complication of anesthesia    makes hair brittle  . GERD (gastroesophageal reflux disease)   . Hyperlipidemia   . Hypertension   . Neuromuscular disorder (HCC)    numbness legs and feet s/p lower back injury  . Stroke [facility])    "mini - strokes" 2009 - no deficit  . Vertigo    none recently  . Vitamin D deficiency   . Wears contact lenses     Past Surgical History:  Procedure Laterality Date  . ABDOMINAL HYSTERECTOMY  1987  . CARDIAC CATHETERIZATION  02/2007  . COLON SURGERY  03/12/2012   Dr Cecelia Byars  . COLONOSCOPY WITH PROPOFOL N/A 09/26/2014   Procedure: COLONOSCOPY WITH PROPOFOL;  Surgeon: Midge Minium, MD;  Location: [facility];  Service: Endoscopy;  Laterality: N/A;  marker (tattoo) used in colon  . ESOPHAGEAL DILATION N/A 02/02/2016   Procedure: ESOPHAGEAL DILATION;  Surgeon: Midge Minium, MD;  Location: [facility];  Service: Endoscopy;  Laterality: N/A;  . ESOPHAGOGASTRODUODENOSCOPY (EGD) WITH PROPOFOL N/A 02/02/2016   Procedure: ESOPHAGOGASTRODUODENOSCOPY (EGD) WITH PROPOFOL;  Surgeon: Midge Minium, MD;  Location: [facility];  Service: Endoscopy;   Laterality: N/A;  . POLYPECTOMY  09/26/2014   Procedure: POLYPECTOMY;  Surgeon: Midge Minium, MD;  Location: [facility];  Service: Endoscopy;;  . TUBAL LIGATION  1972    Family History  Problem Relation Age of Onset  . Diabetes Mother   . Heart disease Mother   . Hypertension Mother   . Mental illness Mother   . Cancer Father        lung cancer  . Drug abuse Brother   . Multiple sclerosis Brother     Social History Social History   Tobacco Use  . Smoking status: Never Smoker  . Smokeless tobacco: Never Used  Substance Use Topics  . Alcohol use: No    Alcohol/week: 0.0 oz  . Drug use: No    Allergies  Allergen Reactions  . Levofloxacin     Tongue and mouth swelling  . Calcium Channel Blockers     Other reaction(s): Dizziness  . Amoxicillin Other (See Comments)    Has patient had a PCN reaction causing immediate rash, facial/tongue/throat swelling, SOB or lightheadedness with hypotension: No Has patient had a PCN reaction causing severe rash involving mucus membranes or skin necrosis: No Has patient had a PCN reaction that required hospitalization: No Has patient had a PCN reaction occurring within the last 10 years: No If all of the above answers are "NO",  then may proceed with Cephalosporin use.   . Nickel Rash  . Penicillins Other (See Comments)    Has patient had a PCN reaction causing immediate rash, facial/tongue/throat swelling, SOB or lightheadedness with hypotension: No Has patient had a PCN reaction causing severe rash involving mucus membranes or skin necrosis: No Has patient had a PCN reaction that required hospitalization: No Has patient had a PCN reaction occurring within the last 10 years: No If all of the above answers are "NO", then may proceed with Cephalosporin use.    Current Outpatient Medications  Medication Sig Dispense Refill  . ALPRAZolam (XANAX) 0.25 MG tablet 1/2 - 1 tablet every evening 30 tablet 3  . ALPRAZolam (XANAX) 0.5 MG  tablet TAKE 1 TABLET BY MOUTH THREE TIMES A DAY AS NEEDED FOR ANXIETY 90 tablet 5  . aspirin 81 MG tablet Take 81 mg by mouth daily.     [name] atorvastatin (LIPITOR) 80 MG tablet Take 1 tablet (80 mg total) by mouth daily. 30 tablet 5  . citalopram (CELEXA) 20 MG tablet Take 1 tablet (20 mg total) daily by mouth. (Patient taking differently: Take 10 mg by mouth daily. ) 90 tablet 4  . nystatin cream (MYCOSTATIN) Apply 1 application topically 2 (two) times daily. Mix with equal amounts of hydrocortisone cream 30 g 0  . omeprazole (PRILOSEC) 20 MG capsule Take 20 mg by mouth daily as needed.    . Polyethylene Glycol 3350 (MIRALAX PO) Take by mouth as needed.    . Probiotic CAPS Take by mouth.     No current facility-administered medications for this visit.     Review of Systems Review of Systems  Constitutional: Negative.   Respiratory: Negative.   Cardiovascular: Negative.     Blood pressure 122/78, pulse 72, resp. rate 14, height 5\' 3"  (1.6 m), weight 160 lb (72.6 kg).  Physical Exam Physical Exam  Constitutional: She is oriented to person, place, and time. She appears well-developed and well-nourished.  Eyes: Conjunctivae are normal. No scleral icterus.  Neck: Neck supple.  Cardiovascular: Normal rate, regular rhythm and normal heart sounds.  Pulmonary/Chest: Effort normal and breath sounds normal.  Abdominal: Soft. Normal appearance. There is no tenderness.  Genitourinary:     Lymphadenopathy:    She has no cervical adenopathy.  Neurological: She is alert and oriented to person, place, and time.  Skin: Skin is warm and dry.  Psychiatric: She has a normal mood and affect.       Assessment    Past history intermittent small bowel obstruction, essentially asymptomatic since February.      Plan    Follow up as needed.  No indication for surgical intervention at this time.  HPI, Physical Exam, Assessment and Plan have been scribed under the direction and in the presence  of Earline Mayotte, MD  Carron Brazen, LPN  I have completed the exam and reviewed the above documentation for accuracy and completeness.  I agree with the above.  Museum/gallery conservator has been used and any errors in dictation or transcription are unintentional.  Donnalee Curry, M.D., F.A.C.S.   Merrily Pew Cyndy Braver 07/22/2017, 11:33 AM

## 2017-08-03 ENCOUNTER — Emergency Department
Admission: EM | Admit: 2017-08-03 | Discharge: 2017-08-03 | Disposition: A | Discharge status: Home / Self Care | Payer: Medicare Other | Attending: Emergency Medicine | Admitting: Emergency Medicine

## 2017-08-03 ENCOUNTER — Other Ambulatory Visit: Payer: Self-pay

## 2017-08-03 ENCOUNTER — Emergency Department: Payer: Medicare Other

## 2017-08-03 DIAGNOSIS — R1010 Upper abdominal pain, unspecified: Secondary | ICD-10-CM | POA: Diagnosis not present

## 2017-08-03 DIAGNOSIS — R1012 Left upper quadrant pain: Secondary | ICD-10-CM | POA: Insufficient documentation

## 2017-08-03 LAB — COMPREHENSIVE METABOLIC PANEL
ALT: 17 U/L (ref 14–54)
AST: 26 U/L (ref 15–41)
Albumin: 4 g/dL (ref 3.5–5.0)
Alkaline Phosphatase: 58 U/L (ref 38–126)
Anion gap: 8 (ref 5–15)
BUN: 18 mg/dL (ref 6–20)
CO2: 23 mmol/L (ref 22–32)
Calcium: 8.8 mg/dL — ABNORMAL LOW (ref 8.9–10.3)
Chloride: 105 mmol/L (ref 101–111)
Creatinine, Ser: 0.73 mg/dL (ref 0.44–1.00)
GFR calc Af Amer: >60 mL/min (ref >60)
GFR calc non Af Amer: >60 mL/min (ref >60)
Glucose, Bld: 121 mg/dL — ABNORMAL HIGH (ref 65–99)
Potassium: 3.3 mmol/L — ABNORMAL LOW (ref 3.5–5.1)
Sodium: 136 mmol/L (ref 135–145)
Total Bilirubin: 0.9 mg/dL (ref 0.3–1.2)
Total Protein: 7.2 g/dL (ref 6.5–8.1)

## 2017-08-03 LAB — LIPASE, BLOOD: Lipase: 38 U/L (ref 11–51)

## 2017-08-03 LAB — CBC
HCT: 43.9 % (ref 35.0–47.0)
Hemoglobin: 14.8 g/dL (ref 12.0–16.0)
MCH: 31.1 pg (ref 26.0–34.0)
MCHC: 33.7 g/dL (ref 32.0–36.0)
MCV: 92.2 fL (ref 80.0–100.0)
Platelets: 214 10*3/uL (ref 150–440)
RBC: 4.77 MIL/uL (ref 3.80–5.20)
RDW: 13.4 % (ref 11.5–14.5)
WBC: 5.7 10*3/uL (ref 3.6–11.0)

## 2017-08-03 LAB — URINALYSIS, COMPLETE (UACMP) WITH MICROSCOPIC
Bacteria, UA: NONE SEEN
Bilirubin Urine: NEGATIVE
Glucose, UA: NEGATIVE mg/dL
Hgb urine dipstick: NEGATIVE
Ketones, ur: NEGATIVE mg/dL
Leukocytes, UA: NEGATIVE
Nitrite: NEGATIVE
Protein, ur: NEGATIVE mg/dL
Specific Gravity, Urine: 1.018 (ref 1.005–1.030)
pH: 6 (ref 5.0–8.0)

## 2017-08-03 MED ORDER — METOCLOPRAMIDE HCL 10 MG PO TABS: 10.0000 mg | ORAL_TABLET | Freq: Three times a day (TID) | ORAL | 1 refills | Status: DC | PRN

## 2017-08-03 NOTE — ED Triage Notes (Signed)
Pt arrives EMS to ED for L sided upper abd pain. States nausea but denies vomiting. States 1 watery BM today. States symptoms began today. Ate fried chicken and pot pie today before pain and nausea began. Alert, oriented, in wheelchair. Hx diverticulitis. Denies urinary symptoms.

## 2017-08-03 NOTE — ED Provider Notes (Signed)
[facility] Emergency Department Provider Note       Time seen: ----------------------------------------- 5:37 PM on 08/03/2017 -----------------------------------------   I have reviewed the triage vital signs and the nursing notes.  HISTORY   Chief Complaint Abdominal Pain    HPI [name] is a 72 y.o. female with a history of anxiety, GERD, hyperlipidemia, hypertension, CVA and small bowel obstruction who presents to the ED for left upper quadrant abdominal pain.  Patient states she has had nausea but no vomiting.  She had one watery bowel movement today.  She ate fried chicken and pot pie today before the pain and nausea began.  She then subsequently felt like she was going to pass out.  She states this happens periodically and states she has scar tissue in her intestines.  Typically she has these symptoms until she feels the food material pass and then her symptoms resolved.  Currently she has no further symptoms.  Past Medical History:  Diagnosis Date  . Anxiety   . Arthritis    lower spine  . Back pain    lower back - S/P lifting injury  . Complication of anesthesia    makes hair brittle  . GERD (gastroesophageal reflux disease)   . Hyperlipidemia   . Hypertension   . Neuromuscular disorder (HCC)    numbness legs and feet s/p lower back injury  . Stroke [facility])    "mini - strokes" 2009 - no deficit  . Vertigo    none recently  . Vitamin D deficiency   . Wears contact lenses     Patient Active Problem List   Diagnosis Date Noted  . SBO (small bowel obstruction) ([place]) 03/13/2017  . Panic disorder 02/21/2016  . Weakness of both lower extremities 02/21/2016  . Depression 02/21/2016  . Problems with swallowing and mastication   . Stricture and stenosis of esophagus   . 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 Acid reflux 10/20/2014  . Hx of colonic polyps   . Benign neoplasm of transverse colon   . Diverticulosis of large intestine without  diverticulitis   . Allergic rhinitis 08/01/2014  . Anxiety 08/01/2014  . HLD (hyperlipidemia) 08/01/2014  . BP (high blood pressure) 08/01/2014  . Vitamin D deficiency 08/01/2014  . H/O adenomatous polyp of colon 01/22/2012    Past Surgical History:  Procedure Laterality Date  . ABDOMINAL HYSTERECTOMY  1987  . CARDIAC CATHETERIZATION  02/2007  . COLON SURGERY  03/12/2012   Dr Phylis Bougie  . COLONOSCOPY WITH PROPOFOL N/A 09/26/2014   Procedure: COLONOSCOPY WITH PROPOFOL;  Surgeon: Lucilla Lame, MD;  Location: [place];  Service: Endoscopy;  Laterality: N/A;  marker (tattoo) used in colon  . ESOPHAGEAL DILATION N/A 02/02/2016   Procedure: ESOPHAGEAL DILATION;  Surgeon: Lucilla Lame, MD;  Location: [place];  Service: Endoscopy;  Laterality: N/A;  . ESOPHAGOGASTRODUODENOSCOPY (EGD) WITH PROPOFOL N/A 02/02/2016   Procedure: ESOPHAGOGASTRODUODENOSCOPY (EGD) WITH PROPOFOL;  Surgeon: Lucilla Lame, MD;  Location: [place];  Service: Endoscopy;  Laterality: N/A;  . POLYPECTOMY  09/26/2014   Procedure: POLYPECTOMY;  Surgeon: Lucilla Lame, MD;  Location: [place];  Service: Endoscopy;;  . TUBAL LIGATION  1972    Allergies Levofloxacin; Calcium channel blockers; Amoxicillin; Nickel; and Penicillins  Social History Social History   Tobacco Use  . Smoking status: Never Smoker  . Smokeless tobacco: Never Used  Substance Use Topics  . Alcohol use: No    Alcohol/week: 0.0 oz  . Drug use:  No   Review of Systems Constitutional: Negative for fever. Cardiovascular: Negative for chest pain. Respiratory: Negative for shortness of breath. Gastrointestinal: Positive for abdominal pain Musculoskeletal: Negative for back pain. Skin: Negative for rash. Neurological: Negative for headaches, focal weakness or numbness.  All systems negative/normal/unremarkable except as stated in the HPI  ____________________________________________   PHYSICAL EXAM:  VITAL  SIGNS: ED Triage Vitals  Enc Vitals Group     BP 08/03/17 1559 124/65     Pulse Rate 08/03/17 1559 67     Resp 08/03/17 1559 14     Temp 08/03/17 1559 97.8 F (36.6 C)     Temp Source 08/03/17 1559 Oral     SpO2 08/03/17 1559 98 %     Weight 08/03/17 1559 155 lb (70.3 kg)     Height 08/03/17 1559 5\' 3"  (1.6 m)     Head Circumference --      Peak Flow --      Pain Score 08/03/17 1606 8     Pain Loc --      Pain Edu? --      Excl. in [place]? --    Constitutional: Alert and oriented. Well appearing and in no distress. Eyes: Conjunctivae are normal. Normal extraocular movements. ENT   Head: Normocephalic and atraumatic.   Nose: No congestion/rhinnorhea.   Mouth/Throat: Mucous membranes are moist.   Neck: No stridor. Cardiovascular: Normal rate, regular rhythm. No murmurs, rubs, or gallops. Respiratory: Normal respiratory effort without tachypnea nor retractions. Breath sounds are clear and equal bilaterally. No wheezes/rales/rhonchi. Gastrointestinal: Soft and nontender. Normal bowel sounds Musculoskeletal: Nontender with normal range of motion in extremities. No lower extremity tenderness nor edema. Neurologic:  Normal speech and language. No gross focal neurologic deficits are appreciated.  Skin:  Skin is warm, dry and intact. No rash noted. Psychiatric: Mood and affect are normal. Speech and behavior are normal.  ___________________________________________  ED COURSE:  As part of my medical decision making, I reviewed the following data within the [place] History obtained from family if available, nursing notes, old chart and ekg, as well as notes from prior ED visits. Patient presented for recent abdominal pain with nausea and near syncope, we will assess with labs and imaging as indicated at this time.   Procedures ____________________________________________   LABS (pertinent positives/negatives)  Labs Reviewed  COMPREHENSIVE METABOLIC PANEL  - Abnormal; Notable for the following components:      Result Value   Potassium 3.3 (*)    Glucose, Bld 121 (*)    Calcium 8.8 (*)    All other components within normal limits  URINALYSIS, COMPLETE (UACMP) WITH MICROSCOPIC - Abnormal; Notable for the following components:   Color, Urine YELLOW (*)    APPearance CLEAR (*)    All other components within normal limits  LIPASE, BLOOD  CBC    RADIOLOGY Images were viewed by me  Abdomen 2 view is unremarkable  ____________________________________________  DIFFERENTIAL DIAGNOSIS   IBS, dehydration, electrolyte abnormality, small bowel obstruction unlikely  FINAL ASSESSMENT AND PLAN  Abdominal pain   Plan: The patient had presented for abdominal pain with recent nausea near syncope. Patient's labs are normal. Patient's imaging was not concerning.  I will prescribe Reglan if this were to happen again, perhaps she has some gastroparesis or nausea that Reglan may help.  She is cleared for outpatient follow-up.   Laurence Aly, MD   Note: This note was generated in part or whole with voice recognition software.  Voice recognition is usually quite accurate but there are transcription errors that can and very often do occur. I apologize for any typographical errors that were not detected and corrected.     Earleen Newport, MD 08/03/17 1739

## 2017-08-11 ENCOUNTER — Ambulatory Visit
Admission: RE | Admit: 2017-08-11 | Discharge: 2017-08-11 | Disposition: A | Discharge status: Home / Self Care | Payer: Medicare Other | Source: Ambulatory Visit | Attending: Family Medicine | Admitting: Family Medicine

## 2017-08-11 ENCOUNTER — Ambulatory Visit (INDEPENDENT_AMBULATORY_CARE_PROVIDER_SITE_OTHER): Payer: Medicare Other | Admitting: Family Medicine

## 2017-08-11 ENCOUNTER — Encounter: Payer: Self-pay | Admitting: Family Medicine

## 2017-08-11 ENCOUNTER — Telehealth: Payer: Self-pay

## 2017-08-11 VITALS — BP 108/80 | HR 66 | Temp 97.7°F | Resp 15 | Wt 159.2 lb

## 2017-08-11 DIAGNOSIS — M25521 Pain in right elbow: Secondary | ICD-10-CM | POA: Diagnosis not present

## 2017-08-11 DIAGNOSIS — M25821 Other specified joint disorders, right elbow: Secondary | ICD-10-CM | POA: Diagnosis not present

## 2017-08-11 DIAGNOSIS — M7711 Lateral epicondylitis, right elbow: Secondary | ICD-10-CM | POA: Diagnosis not present

## 2017-08-11 DIAGNOSIS — M7989 Other specified soft tissue disorders: Secondary | ICD-10-CM | POA: Diagnosis not present

## 2017-08-11 MED ORDER — IBUPROFEN 800 MG PO TABS: 800.0000 mg | ORAL_TABLET | Freq: Three times a day (TID) | ORAL | 0 refills | Status: DC | PRN

## 2017-08-11 NOTE — Progress Notes (Signed)
  Subjective:     Patient ID: [name], female   DOB: 12/12/1945, 72 y.o.   MRN: [id number] Chief Complaint  Patient presents with  . Elbow Pain    Patient comes in office today with complaints of right elbow pain and swelling the past 24hrs. Patient denies any trauma or injury that could have triggered pain, she states tha tshe has difficulty straightning her arm.    HPI States she has been doing housework but unable to elicit any activity that may have contributed to her sx.  Review of Systems     Objective:   Physical Exam  Constitutional: She appears well-developed and well-nourished. She appears distressed (mild discomfort from pain).  Musculoskeletal:  Right lateral epicondyle is moderately swollen and tender with mild increased warmth. No fluctuance.       Assessment:    1. Epicondylitis, lateral, right - DG Elbow Complete Right; Future - ibuprofen (ADVIL,MOTRIN) 800 MG tablet; Take 1 tablet (800 mg total) by mouth every 8 (eight) hours as needed.  Dispense: 30 tablet; Refill: 0    Plan:    Further f/u pending x-ray report. Discussed use of nsaid's and cold compresses.

## 2017-08-11 NOTE — Patient Instructions (Signed)
Start ibuprofen 800 mg (four over the counter strength) 3 x day with food. Cold compresses for 20 minutes several x day. We will call you with the x-ray report.

## 2017-08-11 NOTE — Telephone Encounter (Signed)
-----   Message from Carmon Ginsberg, [place] sent at 08/11/2017 11:25 AM EDT ----- [place] is consistent with the epicondylitis diagnosis we discussed. No fracture or joint fluid accumulation noted. Continue with the treatment we discussed and let me know how you are doing after 48 hours.

## 2017-08-11 NOTE — Telephone Encounter (Signed)
Patient advised.KW 

## 2017-09-26 ENCOUNTER — Ambulatory Visit (INDEPENDENT_AMBULATORY_CARE_PROVIDER_SITE_OTHER): Payer: Medicare Other

## 2017-09-26 VITALS — BP 128/72 | HR 68 | Temp 97.6°F | Ht 63.0 in | Wt 162.8 lb

## 2017-09-26 DIAGNOSIS — Z0000 Encounter for general adult medical examination without abnormal findings: Secondary | ICD-10-CM

## 2017-09-26 NOTE — Patient Instructions (Signed)
[name] , Thank you for taking time to come for your Medicare Wellness Visit. I appreciate your ongoing commitment to your health goals. Please review the following plan we discussed and let me know if I can assist you in the future.   Screening recommendations/referrals: Colonoscopy: Up to date Mammogram: Up to date Bone Density: Up to date Recommended yearly ophthalmology/optometry visit for glaucoma screening and checkup Recommended yearly dental visit for hygiene and checkup  Vaccinations: Influenza vaccine: N/A Pneumococcal vaccine: Prevnar 13 up to date, declined Pneumovax 23 today. Tdap vaccine: Pt declines today.  Shingles vaccine: Pt declines today.     Advanced directives: Advance directive discussed with you today. I have provided a copy for you to complete at home and have notarized. Once this is complete please bring a copy in to our office so we can scan it into your chart.  Conditions/risks identified: Recommend decreasing portion sizes in half and eating 3 small meals a day with 2 healthy snacks in between.   Next appointment: 10/01/17 with Dr [name].    Preventive Care 70 Years and Older, Female Preventive care refers to lifestyle choices and visits with your health care provider that can promote health and wellness. What does preventive care include?  A yearly physical exam. This is also called an annual well check.  Dental exams once or twice a year.  Routine eye exams. Ask your health care provider how often you should have your eyes checked.  Personal lifestyle choices, including:  Daily care of your teeth and gums.  Regular physical activity.  Eating a healthy diet.  Avoiding tobacco and drug use.  Limiting alcohol use.  Practicing safe sex.  Taking low-dose aspirin every day.  Taking vitamin and mineral supplements as recommended by your health care provider. What happens during an annual well check? The services and screenings done by your  health care provider during your annual well check will depend on your age, overall health, lifestyle risk factors, and family history of disease. Counseling  Your health care provider may ask you questions about your:  Alcohol use.  Tobacco use.  Drug use.  Emotional well-being.  Home and relationship well-being.  Sexual activity.  Eating habits.  History of falls.  Memory and ability to understand (cognition).  Work and work Statistician.  Reproductive health. Screening  You may have the following tests or measurements:  Height, weight, and BMI.  Blood pressure.  Lipid and cholesterol levels. These may be checked every 5 years, or more frequently if you are over 40 years old.  Skin check.  Lung cancer screening. You may have this screening every year starting at age 45 if you have a 30-pack-year history of smoking and currently smoke or have quit within the past 15 years.  Fecal occult blood test (FOBT) of the stool. You may have this test every year starting at age 40.  Flexible sigmoidoscopy or colonoscopy. You may have a sigmoidoscopy every 5 years or a colonoscopy every 10 years starting at age 32.  Hepatitis C blood test.  Hepatitis B blood test.  Sexually transmitted disease (STD) testing.  Diabetes screening. This is done by checking your blood sugar (glucose) after you have not eaten for a while (fasting). You may have this done every 1-3 years.  Bone density scan. This is done to screen for osteoporosis. You may have this done starting at age 40.  Mammogram. This may be done every 1-2 years. Talk to your health care provider about  how often you should have regular mammograms. Talk with your health care provider about your test results, treatment options, and if necessary, the need for more tests. Vaccines  Your health care provider may recommend certain vaccines, such as:  Influenza vaccine. This is recommended every year.  Tetanus, diphtheria, and  acellular pertussis (Tdap, Td) vaccine. You may need a Td booster every 10 years.  Zoster vaccine. You may need this after age 16.  Pneumococcal 13-valent conjugate (PCV13) vaccine. One dose is recommended after age 63.  Pneumococcal polysaccharide (PPSV23) vaccine. One dose is recommended after age 53. Talk to your health care provider about which screenings and vaccines you need and how often you need them. This information is not intended to replace advice given to you by your health care provider. Make sure you discuss any questions you have with your health care provider. Document Released: 03/03/2015 Document Revised: 10/25/2015 Document Reviewed: 12/06/2014 Elsevier Interactive Patient Education  2017 [place] Prevention in the Home Falls can cause injuries. They can happen to people of all ages. There are many things you can do to make your home safe and to help prevent falls. What can I do on the outside of my home?  Regularly fix the edges of walkways and driveways and fix any cracks.  Remove anything that might make you trip as you walk through a door, such as a raised step or threshold.  Trim any bushes or trees on the path to your home.  Use bright outdoor lighting.  Clear any walking paths of anything that might make someone trip, such as rocks or tools.  Regularly check to see if handrails are loose or broken. Make sure that both sides of any steps have handrails.  Any raised decks and porches should have guardrails on the edges.  Have any leaves, snow, or ice cleared regularly.  Use sand or salt on walking paths during winter.  Clean up any spills in your garage right away. This includes oil or grease spills. What can I do in the bathroom?  Use night lights.  Install grab bars by the toilet and in the tub and shower. Do not use towel bars as grab bars.  Use non-skid mats or decals in the tub or shower.  If you need to sit down in the shower, use  a plastic, non-slip stool.  Keep the floor dry. Clean up any water that spills on the floor as soon as it happens.  Remove soap buildup in the tub or shower regularly.  Attach bath mats securely with double-sided non-slip rug tape.  Do not have throw rugs and other things on the floor that can make you trip. What can I do in the bedroom?  Use night lights.  Make sure that you have a light by your bed that is easy to reach.  Do not use any sheets or blankets that are too big for your bed. They should not hang down onto the floor.  Have a firm chair that has side arms. You can use this for support while you get dressed.  Do not have throw rugs and other things on the floor that can make you trip. What can I do in the kitchen?  Clean up any spills right away.  Avoid walking on wet floors.  Keep items that you use a lot in easy-to-reach places.  If you need to reach something above you, use a strong step stool that has a grab bar.  Keep  electrical cords out of the way.  Do not use floor polish or wax that makes floors slippery. If you must use wax, use non-skid floor wax.  Do not have throw rugs and other things on the floor that can make you trip. What can I do with my stairs?  Do not leave any items on the stairs.  Make sure that there are handrails on both sides of the stairs and use them. Fix handrails that are broken or loose. Make sure that handrails are as long as the stairways.  Check any carpeting to make sure that it is firmly attached to the stairs. Fix any carpet that is loose or worn.  Avoid having throw rugs at the top or bottom of the stairs. If you do have throw rugs, attach them to the floor with carpet tape.  Make sure that you have a light switch at the top of the stairs and the bottom of the stairs. If you do not have them, ask someone to add them for you. What else can I do to help prevent falls?  Wear shoes that:  Do not have high heels.  Have  rubber bottoms.  Are comfortable and fit you well.  Are closed at the toe. Do not wear sandals.  If you use a stepladder:  Make sure that it is fully opened. Do not climb a closed stepladder.  Make sure that both sides of the stepladder are locked into place.  Ask someone to hold it for you, if possible.  Clearly mark and make sure that you can see:  Any grab bars or handrails.  First and last steps.  Where the edge of each step is.  Use tools that help you move around (mobility aids) if they are needed. These include:  Canes.  Walkers.  Scooters.  Crutches.  Turn on the lights when you go into a dark area. Replace any light bulbs as soon as they burn out.  Set up your furniture so you have a clear path. Avoid moving your furniture around.  If any of your floors are uneven, fix them.  If there are any pets around you, be aware of where they are.  Review your medicines with your doctor. Some medicines can make you feel dizzy. This can increase your chance of falling. Ask your doctor what other things that you can do to help prevent falls. This information is not intended to replace advice given to you by your health care provider. Make sure you discuss any questions you have with your health care provider. Document Released: 12/01/2008 Document Revised: 07/13/2015 Document Reviewed: 03/11/2014 Elsevier Interactive Patient Education  2017 [employer].

## 2017-09-26 NOTE — Progress Notes (Signed)
Subjective:   [name] is a 72 y.o. female who presents for Medicare Annual (Subsequent) preventive examination.  Review of Systems:  N/A  Cardiac Risk Factors include: advanced age (>73men, >40 women);dyslipidemia;hypertension     Objective:     Vitals: BP 128/72 (BP Location: Right Arm)   Pulse 68   Temp 97.6 F (36.4 C) (Oral)   Ht 5\' 3"  (1.6 m)   Wt 162 lb 12.8 oz (73.8 kg)   BMI 28.84 kg/m   Body mass index is 28.84 kg/m.  Advanced Directives 09/26/2017 03/13/2017 03/13/2017 03/02/2017 09/25/2016 02/02/2016 03/07/2015  Does Patient Have a Medical Advance Directive? No No No No No No No  Would patient like information on creating a medical advance directive? Yes (MAU/Ambulatory/Procedural Areas - Information given) No - Patient declined No - Patient declined - Yes (ED - Information included in AVS) No - Patient declined Yes - Scientist, clinical (histocompatibility and immunogenetics) given    Tobacco Social History   Tobacco Use  Smoking Status Never Smoker  Smokeless Tobacco Never Used     Counseling given: Not Answered   Clinical Intake:  Pre-visit preparation completed: Yes  Pain : No/denies pain Pain Score: 0-No pain     Nutritional Status: BMI 25 -29 Overweight Nutritional Risks: None Diabetes: No  How often do you need to have someone help you when you read instructions, pamphlets, or other written materials from your doctor or pharmacy?: 1 - Never  Interpreter Needed?: No  Information entered by :: [facility], LPN  Past Medical History:  Diagnosis Date  . Anxiety   . Arthritis    lower spine  . Back pain    lower back - S/P lifting injury  . Complication of anesthesia    makes hair brittle  . GERD (gastroesophageal reflux disease)   . Hyperlipidemia   . Hypertension   . Neuromuscular disorder (HCC)    numbness legs and feet s/p lower back injury  . Stroke [facility])    "mini - strokes" 2009 - no deficit  . Vertigo    none recently  . Vitamin D deficiency   . Wears contact  lenses    Past Surgical History:  Procedure Laterality Date  . ABDOMINAL HYSTERECTOMY  1987  . CARDIAC CATHETERIZATION  02/2007  . COLON SURGERY  03/12/2012   Dr Phylis Bougie  . COLONOSCOPY WITH PROPOFOL N/A 09/26/2014   Procedure: COLONOSCOPY WITH PROPOFOL;  Surgeon: Lucilla Lame, MD;  Location: [place];  Service: Endoscopy;  Laterality: N/A;  marker (tattoo) used in colon  . ESOPHAGEAL DILATION N/A 02/02/2016   Procedure: ESOPHAGEAL DILATION;  Surgeon: Lucilla Lame, MD;  Location: [place];  Service: Endoscopy;  Laterality: N/A;  . ESOPHAGOGASTRODUODENOSCOPY (EGD) WITH PROPOFOL N/A 02/02/2016   Procedure: ESOPHAGOGASTRODUODENOSCOPY (EGD) WITH PROPOFOL;  Surgeon: Lucilla Lame, MD;  Location: [place];  Service: Endoscopy;  Laterality: N/A;  . POLYPECTOMY  09/26/2014   Procedure: POLYPECTOMY;  Surgeon: Lucilla Lame, MD;  Location: [place];  Service: Endoscopy;;  . TUBAL LIGATION  1972   Family History  Problem Relation Age of Onset  . Diabetes Mother   . Heart disease Mother   . Hypertension Mother   . Mental illness Mother   . Cancer Father        lung cancer  . Drug abuse Brother   . Multiple sclerosis Brother    Social History   Socioeconomic History  . Marital status: Married    Spouse name: Not on file  .  Number of children: 3  . Years of education: Not on file  . Highest education level: 12th grade  Occupational History  . Not on file  Social Needs  . Financial resource strain: Not hard at all  . Food insecurity:    Worry: Never true    Inability: Never true  . Transportation needs:    Medical: No    Non-medical: No  Tobacco Use  . Smoking status: Never Smoker  . Smokeless tobacco: Never Used  Substance and Sexual Activity  . Alcohol use: No    Alcohol/week: 0.0 standard drinks  . Drug use: No  . Sexual activity: Not on file  Lifestyle  . Physical activity:    Days per week: Not on file    Minutes per session: Not on file    . Stress: Very much  Relationships  . Social connections:    Talks on phone: Not on file    Gets together: Not on file    Attends religious service: Not on file    Active member of club or organization: Not on file    Attends meetings of clubs or organizations: Not on file    Relationship status: Not on file  Other Topics Concern  . Not on file  Social History Narrative  . Not on file    Outpatient Encounter Medications as of 09/26/2017  Medication Sig  . ALPRAZolam (XANAX) 0.5 MG tablet TAKE 1 TABLET BY MOUTH THREE TIMES A DAY AS NEEDED FOR ANXIETY  . atorvastatin (LIPITOR) 80 MG tablet Take 1 tablet (80 mg total) by mouth daily.  . citalopram (CELEXA) 20 MG tablet Take 1 tablet (20 mg total) daily by mouth. (Patient taking differently: Take 10 mg by mouth daily. )  . ibuprofen (ADVIL,MOTRIN) 800 MG tablet Take 1 tablet (800 mg total) by mouth every 8 (eight) hours as needed.  . nystatin cream (MYCOSTATIN) Apply 1 application topically 2 (two) times daily. Mix with equal amounts of hydrocortisone cream (Patient taking differently: Apply 1 application topically 2 (two) times daily. Mix with equal amounts of hydrocortisone cream)  . omeprazole (PRILOSEC) 20 MG capsule Take 20 mg by mouth daily as needed.  . Polyethylene Glycol 3350 (MIRALAX PO) Take by mouth as needed.  . Probiotic CAPS Take by mouth daily.   [name] ALPRAZolam (XANAX) 0.25 MG tablet 1/2 - 1 tablet every evening (Patient not taking: Reported on 09/26/2017)  . metoCLOPramide (REGLAN) 10 MG tablet Take 1 tablet (10 mg total) by mouth every 8 (eight) hours as needed for nausea or vomiting. (Patient not taking: Reported on 08/11/2017)   No facility-administered encounter medications on file as of 09/26/2017.     Activities of Daily Living In your present state of health, do you have any difficulty performing the following activities: 09/26/2017 03/13/2017  Hearing? Y N  Comment Does not wear hearing aids.  -  Vision? N N  Difficulty  concentrating or making decisions? Y N  Walking or climbing stairs? N N  Dressing or bathing? N N  Doing errands, shopping? N N  Preparing Food and eating ? N -  Using the Toilet? N -  In the past six months, have you accidently leaked urine? N -  Do you have problems with loss of bowel control? N -  Managing your Medications? N -  Managing your Finances? N -  Housekeeping or managing your Housekeeping? N -  Some recent data might be hidden    Patient Care Team: Lelon Huh  E, MD as PCP - General (Family Medicine) Lucilla Lame, MD as Consulting Physician (Gastroenterology) Bary Castilla Forest Gleason, MD as Consulting Physician (General Surgery)    Assessment:   This is a routine wellness examination for [place].  Exercise Activities and Dietary recommendations Current Exercise Habits: The patient does not participate in regular exercise at present, Exercise limited by: None identified  Goals    . DIET - REDUCE PORTION SIZE     Recommend decreasing portion sizes in half and eating 3 small meals a day with 2 healthy snacks in between.     . Eat more fruits and vegetables     Recommend increasing consumption of fruits and vegetables daily (2 servings).       Fall Risk Fall Risk  09/26/2017 09/25/2016 02/21/2016  Falls in the past year? Yes Yes No  Number falls in past yr: 1 1 -  Injury with Fall? No No -  Follow up Falls prevention discussed Falls prevention discussed -   Is the patient's home free of loose throw rugs in walkways, pet beds, electrical cords, etc?   yes      Grab bars in the bathroom? no      Handrails on the stairs?   no      Adequate lighting?   yes  Timed Get Up and Go performed: N/A  Depression Screen PHQ 2/9 Scores 09/26/2017 09/25/2016 02/21/2016  PHQ - 2 Score 6 6 1   PHQ- 9 Score 18 24 -     Cognitive Function: Pt declined screening today.      6CIT Screen 09/25/2016  What Year? 0 points  What month? 0 points  What time? 0 points  Count back from 20 0  points  Months in reverse 0 points  Repeat phrase 2 points  Total Score 2    Immunization History  Administered Date(s) Administered  . Pneumococcal Conjugate-13 01/19/2014    Qualifies for Shingles Vaccine? Due for Shingles vaccine. Declined my offer to administer today. Education has been provided regarding the importance of this vaccine. Pt has been advised to call her insurance company to determine her out of pocket expense. Advised she may also receive this vaccine at her local pharmacy or Health Dept. Verbalized acceptance and understanding.  Screening Tests Health Maintenance  Topic Date Due  . MAMMOGRAM  11/15/1997  . PNA vac Low Risk Adult (2 of 2 - PPSV23) 01/20/2015  . INFLUENZA VACCINE  09/18/2017  . TETANUS/TDAP  02/18/2026 (Originally 11/16/1966)  . COLONOSCOPY  09/25/2024  . DEXA SCAN  Completed  . Hepatitis C Screening  Completed    Cancer Screenings: Lung: Low Dose CT Chest recommended if Age 43-80 years, 30 pack-year currently smoking OR have quit w/in 15years. Patient does not qualify. Breast:  Up to date on Mammogram? Yes   Up to date of Bone Density/Dexa? Yes Colorectal: Up to date  Additional Screenings:  Hepatitis C Screening: Up to date     Plan:  I have personally reviewed and addressed the Medicare Annual Wellness questionnaire and have noted the following in the patient's chart:  A. Medical and social history B. Use of alcohol, tobacco or illicit drugs  C. Current medications and supplements D. Functional ability and status E.  Nutritional status F.  Physical activity G. Advance directives H. List of other physicians I.  Hospitalizations, surgeries, and ER visits in previous 12 months J.  [place] such as hearing and vision if needed, cognitive and depression L. Referrals and appointments -  none  In addition, I have reviewed and discussed with patient certain preventive protocols, quality metrics, and best practice  recommendations. A written personalized care plan for preventive services as well as general preventive health recommendations were provided to patient.  See attached scanned questionnaire for additional information.   Signed,  Fabio Neighbors, LPN Nurse Health Advisor   Nurse Recommendations: Pt declined the mammogram order, tetanus and Pneumovax 23 vaccines today.

## 2017-09-29 NOTE — Progress Notes (Deleted)
Patient: [name] Female    DOB: 11/21/1945   72 y.o.   MRN: [id number] Visit Date: 09/29/2017  Today's Provider: Lelon Huh, MD   No chief complaint on file.  Subjective:    HPI     Allergies  Allergen Reactions  . Levofloxacin     Tongue and mouth swelling  . Calcium Channel Blockers     Other reaction(s): Dizziness  . Amoxicillin Other (See Comments)    Has patient had a PCN reaction causing immediate rash, facial/tongue/throat swelling, SOB or lightheadedness with hypotension: No Has patient had a PCN reaction causing severe rash involving mucus membranes or skin necrosis: No Has patient had a PCN reaction that required hospitalization: No Has patient had a PCN reaction occurring within the last 10 years: No If all of the above answers are "NO", then may proceed with Cephalosporin use.   . Nickel Rash  . Penicillins Other (See Comments)    Has patient had a PCN reaction causing immediate rash, facial/tongue/throat swelling, SOB or lightheadedness with hypotension: No Has patient had a PCN reaction causing severe rash involving mucus membranes or skin necrosis: No Has patient had a PCN reaction that required hospitalization: No Has patient had a PCN reaction occurring within the last 10 years: No If all of the above answers are "NO", then may proceed with Cephalosporin use.     Current Outpatient Medications:  .  ALPRAZolam (XANAX) 0.25 MG tablet, 1/2 - 1 tablet every evening (Patient not taking: Reported on 09/26/2017), Disp: 30 tablet, Rfl: 3 .  ALPRAZolam (XANAX) 0.5 MG tablet, TAKE 1 TABLET BY MOUTH THREE TIMES A DAY AS NEEDED FOR ANXIETY, Disp: 90 tablet, Rfl: 5 .  atorvastatin (LIPITOR) 80 MG tablet, Take 1 tablet (80 mg total) by mouth daily., Disp: 30 tablet, Rfl: 5 .  citalopram (CELEXA) 20 MG tablet, Take 1 tablet (20 mg total) daily by mouth. (Patient taking differently: Take 10 mg by mouth daily. ), Disp: 90 tablet, Rfl: 4 .  ibuprofen  (ADVIL,MOTRIN) 800 MG tablet, Take 1 tablet (800 mg total) by mouth every 8 (eight) hours as needed., Disp: 30 tablet, Rfl: 0 .  metoCLOPramide (REGLAN) 10 MG tablet, Take 1 tablet (10 mg total) by mouth every 8 (eight) hours as needed for nausea or vomiting. (Patient not taking: Reported on 08/11/2017), Disp: 30 tablet, Rfl: 1 .  nystatin cream (MYCOSTATIN), Apply 1 application topically 2 (two) times daily. Mix with equal amounts of hydrocortisone cream (Patient taking differently: Apply 1 application topically 2 (two) times daily. Mix with equal amounts of hydrocortisone cream), Disp: 30 g, Rfl: 0 .  omeprazole (PRILOSEC) 20 MG capsule, Take 20 mg by mouth daily as needed., Disp: , Rfl:  .  Polyethylene Glycol 3350 (MIRALAX PO), Take by mouth as needed., Disp: , Rfl:  .  Probiotic CAPS, Take by mouth daily. , Disp: , Rfl:   Review of Systems  Constitutional: Negative for appetite change, chills, fatigue and fever.  Respiratory: Negative for chest tightness and shortness of breath.   Cardiovascular: Negative for chest pain and palpitations.  Gastrointestinal: Negative for abdominal pain, nausea and vomiting.  Neurological: Negative for dizziness and weakness.    Social History   Tobacco Use  . Smoking status: Never Smoker  . Smokeless tobacco: Never Used  Substance Use Topics  . Alcohol use: No    Alcohol/week: 0.0 standard drinks   Objective:   There were no vitals taken for this  visit. There were no vitals filed for this visit.   Physical Exam      Assessment & Plan:           Lelon Huh, MD  [facility]

## 2017-10-01 ENCOUNTER — Encounter: Payer: Self-pay | Admitting: Family Medicine

## 2017-10-01 ENCOUNTER — Ambulatory Visit: Payer: Self-pay | Admitting: Family Medicine

## 2017-10-01 ENCOUNTER — Ambulatory Visit (INDEPENDENT_AMBULATORY_CARE_PROVIDER_SITE_OTHER): Payer: Medicare Other | Admitting: Family Medicine

## 2017-10-01 VITALS — BP 116/79 | HR 79 | Temp 97.7°F | Resp 16 | Wt 160.0 lb

## 2017-10-01 DIAGNOSIS — E785 Hyperlipidemia, unspecified: Secondary | ICD-10-CM | POA: Diagnosis not present

## 2017-10-01 NOTE — Progress Notes (Signed)
Patient: [name] Female    DOB: March 15, 1945   72 y.o.   MRN: [id number] Visit Date: 10/01/2017  Today's Provider: Lelon Huh, MD   Chief Complaint  Patient presents with  . Follow-up  . Hyperlipidemia  . Anxiety   Subjective:    HPI   Lipid/Cholesterol, Follow-up:   Last seen for this 3 months ago.  Management since that visit includes; labs checked. Changed atorvastatin to 80 mg qd. Will recheck in 3 months.  Last Lipid Panel:    Component Value Date/Time   CHOL 224 (H) 06/27/2017 1221   CHOL 235 (H) 06/18/2012 1415   TRIG 52 06/27/2017 1221   TRIG 96 06/18/2012 1415   HDL 50 06/27/2017 1221   HDL 47 06/18/2012 1415   CHOLHDL 4.5 (H) 06/27/2017 1221   VLDL 19 06/18/2012 1415   LDLCALC 164 (H) 06/27/2017 1221   LDLCALC 169 (H) 06/18/2012 1415   LDLDIRECT 217 (H) 04/29/2017 1559    She reports good compliance with treatment. She is not having side effects.   Wt Readings from Last 3 Encounters:  10/01/17 160 lb (72.6 kg)  09/26/17 162 lb 12.8 oz (73.8 kg)  08/11/17 159 lb 3.2 oz (72.2 kg)    ------------------------------------------------------------------------    Allergies  Allergen Reactions  . Levofloxacin     Tongue and mouth swelling  . Calcium Channel Blockers     Other reaction(s): Dizziness  . Amoxicillin Other (See Comments)    Has patient had a PCN reaction causing immediate rash, facial/tongue/throat swelling, SOB or lightheadedness with hypotension: No Has patient had a PCN reaction causing severe rash involving mucus membranes or skin necrosis: No Has patient had a PCN reaction that required hospitalization: No Has patient had a PCN reaction occurring within the last 10 years: No If all of the above answers are "NO", then may proceed with Cephalosporin use.   . Nickel Rash  . Penicillins Other (See Comments)    Has patient had a PCN reaction causing immediate rash, facial/tongue/throat swelling, SOB or lightheadedness  with hypotension: No Has patient had a PCN reaction causing severe rash involving mucus membranes or skin necrosis: No Has patient had a PCN reaction that required hospitalization: No Has patient had a PCN reaction occurring within the last 10 years: No If all of the above answers are "NO", then may proceed with Cephalosporin use.     Current Outpatient Medications:  .  ALPRAZolam (XANAX) 0.5 MG tablet, TAKE 1 TABLET BY MOUTH THREE TIMES A DAY AS NEEDED FOR ANXIETY, Disp: 90 tablet, Rfl: 5 .  atorvastatin (LIPITOR) 80 MG tablet, Take 1 tablet (80 mg total) by mouth daily., Disp: 30 tablet, Rfl: 5 .  citalopram (CELEXA) 20 MG tablet, Take 1 tablet (20 mg total) daily by mouth. (Patient taking differently: Take 10 mg by mouth daily. ), Disp: 90 tablet, Rfl: 4 .  ibuprofen (ADVIL,MOTRIN) 800 MG tablet, Take 1 tablet (800 mg total) by mouth every 8 (eight) hours as needed., Disp: 30 tablet, Rfl: 0 .  nystatin cream (MYCOSTATIN), Apply 1 application topically 2 (two) times daily. Mix with equal amounts of hydrocortisone cream (Patient taking differently: Apply 1 application topically 2 (two) times daily. Mix with equal amounts of hydrocortisone cream), Disp: 30 g, Rfl: 0 .  omeprazole (PRILOSEC) 20 MG capsule, Take 20 mg by mouth daily as needed., Disp: , Rfl:  .  Polyethylene Glycol 3350 (MIRALAX PO), Take by mouth as needed., Disp: ,  Rfl:  .  Probiotic CAPS, Take by mouth daily. , Disp: , Rfl:   Review of Systems  Constitutional: Negative for appetite change, chills, fatigue and fever.  Respiratory: Negative for chest tightness and shortness of breath.   Cardiovascular: Negative for chest pain and palpitations.  Gastrointestinal: Negative for abdominal pain, nausea and vomiting.  Neurological: Negative for dizziness and weakness.    Social History   Tobacco Use  . Smoking status: Never Smoker  . Smokeless tobacco: Never Used  Substance Use Topics  . Alcohol use: No    Alcohol/week: 0.0  standard drinks   Objective:   BP 116/79 (BP Location: Right Arm, Patient Position: Sitting, Cuff Size: Normal)   Pulse 79   Temp 97.7 F (36.5 C) (Oral)   Resp 16   Wt 160 lb (72.6 kg)   SpO2 96%   BMI 28.34 kg/m  Vitals:   10/01/17 1136  BP: 116/79  Pulse: 79  Resp: 16  Temp: 97.7 F (36.5 C)  TempSrc: Oral  SpO2: 96%  Weight: 160 lb (72.6 kg)     Physical Exam  General appearance: alert, well developed, well nourished, cooperative and in no distress Head: Normocephalic, without obvious abnormality, atraumatic Respiratory: Respirations even and unlabored, normal respiratory rate Extremities: No gross deformities Skin: Skin color, texture, turgor normal. No rashes seen  Psych: Appropriate mood and affect. Neurologic: Mental status: Alert, oriented to person, place, and time, thought content appropriate.     Assessment & Plan:     1. Hyperlipidemia, unspecified hyperlipidemia type Tolerating increased dose of atorvastatin well.  - Lipid panel - Comprehensive metabolic panel       Lelon Huh, MD  [facility]

## 2017-10-02 LAB — COMPREHENSIVE METABOLIC PANEL
ALT: 17 IU/L (ref 0–32)
AST: 21 IU/L (ref 0–40)
Albumin/Globulin Ratio: 1.3 (ref 1.2–2.2)
Albumin: 4.1 g/dL (ref 3.6–4.8)
Alkaline Phosphatase: 69 IU/L (ref 39–117)
BUN/Creatinine Ratio: 13 (ref 12–28)
BUN: 11 mg/dL (ref 8–27)
Bilirubin Total: 0.8 mg/dL (ref 0.0–1.2)
CO2: 22 mmol/L (ref 20–29)
Calcium: 9.2 mg/dL (ref 8.7–10.3)
Chloride: 103 mmol/L (ref 96–106)
Creatinine, Ser: 0.87 mg/dL (ref 0.57–1.00)
GFR calc Af Amer: 79 mL/min/{1.73_m2} (ref >59)
GFR calc non Af Amer: 68 mL/min/{1.73_m2} (ref >59)
Globulin, Total: 3.1 g/dL (ref 1.5–4.5)
Glucose: 88 mg/dL (ref 65–99)
Potassium: 4.5 mmol/L (ref 3.5–5.2)
Sodium: 138 mmol/L (ref 134–144)
Total Protein: 7.2 g/dL (ref 6.0–8.5)

## 2017-10-02 LAB — LIPID PANEL
Chol/HDL Ratio: 4.4 ratio (ref 0.0–4.4)
Cholesterol, Total: 261 mg/dL — ABNORMAL HIGH (ref 100–199)
HDL: 59 mg/dL (ref >39)
LDL Calculated: 189 mg/dL — ABNORMAL HIGH (ref 0–99)
Triglycerides: 67 mg/dL (ref 0–149)
VLDL Cholesterol Cal: 13 mg/dL (ref 5–40)

## 2017-10-06 ENCOUNTER — Telehealth: Payer: Self-pay | Admitting: *Deleted

## 2017-10-06 NOTE — Telephone Encounter (Signed)
Need to start back on atorvastatin 80 and take every single day. Follow up in 3 months to recheck lipids.

## 2017-10-06 NOTE — Telephone Encounter (Signed)
LMOVM for pt to return call 

## 2017-10-06 NOTE — Telephone Encounter (Signed)
Patient was notified of results. Patient stated she was taking atorvastatin, but she has not taken it in about 2-3 weeks.

## 2017-10-06 NOTE — Telephone Encounter (Signed)
-----   Message from Birdie Sons, MD sent at 10/02/2017 12:57 PM EDT ----- Cholesterol has gone up from 224 to 261, please double check and verify that patient has been taking the 80mg  atorvastatin that was prescribed in May. If so then we need to change medications.

## 2017-11-27 DIAGNOSIS — E7849 Other hyperlipidemia: Secondary | ICD-10-CM | POA: Diagnosis not present

## 2017-11-27 DIAGNOSIS — I700 Atherosclerosis of aorta: Secondary | ICD-10-CM | POA: Diagnosis not present

## 2017-12-05 ENCOUNTER — Ambulatory Visit (INDEPENDENT_AMBULATORY_CARE_PROVIDER_SITE_OTHER): Payer: Medicare Other | Admitting: Family Medicine

## 2017-12-05 ENCOUNTER — Encounter: Payer: Self-pay | Admitting: Family Medicine

## 2017-12-05 VITALS — BP 120/60 | HR 74 | Temp 98.1°F | Resp 16 | Wt 160.0 lb

## 2017-12-05 DIAGNOSIS — J069 Acute upper respiratory infection, unspecified: Secondary | ICD-10-CM | POA: Diagnosis not present

## 2017-12-05 DIAGNOSIS — R05 Cough: Secondary | ICD-10-CM | POA: Diagnosis not present

## 2017-12-05 DIAGNOSIS — R059 Cough, unspecified: Secondary | ICD-10-CM

## 2017-12-05 LAB — POCT INFLUENZA A/B
Influenza A, POC: NEGATIVE
Influenza B, POC: NEGATIVE

## 2017-12-05 MED ORDER — CEPHALEXIN 500 MG PO CAPS: 500.0000 mg | ORAL_CAPSULE | Freq: Four times a day (QID) | ORAL | 0 refills | Status: AC

## 2017-12-05 MED ORDER — FLUCONAZOLE 150 MG PO TABS: 150.0000 mg | ORAL_TABLET | Freq: Once | ORAL | 0 refills | Status: DC

## 2017-12-05 MED ORDER — HYDROCODONE-HOMATROPINE 5-1.5 MG/5ML PO SYRP: 5.0000 mL | ORAL_SOLUTION | Freq: Three times a day (TID) | ORAL | 0 refills | Status: DC | PRN

## 2017-12-05 NOTE — Progress Notes (Signed)
Patient: [name] Female    DOB: [birth year]   72 y.o.   MRN: [id number] Visit Date: 12/05/2017  Today's Provider: Lelon Huh, MD   Chief Complaint  Patient presents with  . URI   Subjective:    URI   The current episode started in the past 7 days (3 days). The problem has been gradually worsening. There has been no fever (has felt feverish). Associated symptoms include congestion, coughing, headaches, rhinorrhea and sinus pain. She has tried increased fluids for the symptoms. The treatment provided no relief.   Patient reports that her symptoms were at its worse last night. Started as chills yesterday with sore throat and cough all night.. She has only increased fluids to help with her symptoms.     Allergies  Allergen Reactions  . Levofloxacin     Tongue and mouth swelling  . Calcium Channel Blockers     Other reaction(s): Dizziness  . Amoxicillin Other (See Comments)    Has patient had a PCN reaction causing immediate rash, facial/tongue/throat swelling, SOB or lightheadedness with hypotension: No Has patient had a PCN reaction causing severe rash involving mucus membranes or skin necrosis: No Has patient had a PCN reaction that required hospitalization: No Has patient had a PCN reaction occurring within the last 10 years: No If all of the above answers are "NO", then may proceed with Cephalosporin use.   . Nickel Rash  . Penicillins Other (See Comments)    Has patient had a PCN reaction causing immediate rash, facial/tongue/throat swelling, SOB or lightheadedness with hypotension: No Has patient had a PCN reaction causing severe rash involving mucus membranes or skin necrosis: No Has patient had a PCN reaction that required hospitalization: No Has patient had a PCN reaction occurring within the last 10 years: No If all of the above answers are "NO", then may proceed with Cephalosporin use.     Current Outpatient Medications:  .  ALPRAZolam (XANAX) 0.5 MG  tablet, TAKE 1 TABLET BY MOUTH THREE TIMES A DAY AS NEEDED FOR ANXIETY, Disp: 90 tablet, Rfl: 5 .  atorvastatin (LIPITOR) 80 MG tablet, Take 1 tablet (80 mg total) by mouth daily., Disp: 30 tablet, Rfl: 5 .  citalopram (CELEXA) 20 MG tablet, Take 1 tablet (20 mg total) daily by mouth. (Patient taking differently: Take 10 mg by mouth daily. ), Disp: 90 tablet, Rfl: 4 .  ibuprofen (ADVIL,MOTRIN) 800 MG tablet, Take 1 tablet (800 mg total) by mouth every 8 (eight) hours as needed., Disp: 30 tablet, Rfl: 0 .  nystatin cream (MYCOSTATIN), Apply 1 application topically 2 (two) times daily. Mix with equal amounts of hydrocortisone cream (Patient taking differently: Apply 1 application topically 2 (two) times daily. Mix with equal amounts of hydrocortisone cream), Disp: 30 g, Rfl: 0 .  omeprazole (PRILOSEC) 20 MG capsule, Take 20 mg by mouth daily as needed., Disp: , Rfl:  .  Polyethylene Glycol 3350 (MIRALAX PO), Take by mouth as needed., Disp: , Rfl:  .  Probiotic CAPS, Take by mouth daily. , Disp: , Rfl:   Review of Systems  Constitutional: Positive for activity change, appetite change, chills and fatigue.  HENT: Positive for congestion, postnasal drip, rhinorrhea, sinus pain and voice change.   Respiratory: Positive for cough. Negative for shortness of breath.   Musculoskeletal: Negative for myalgias.  Neurological: Positive for headaches.    Social History   Tobacco Use  . Smoking status: Never Smoker  . Smokeless tobacco:  Never Used  Substance Use Topics  . Alcohol use: No    Alcohol/week: 0.0 standard drinks   Objective:   BP 120/60 (BP Location: Right Arm, Patient Position: Sitting, Cuff Size: Normal)   Pulse 74   Temp 98.1 F (36.7 C)   Resp 16   Wt 160 lb (72.6 kg)   SpO2 98%   BMI 28.34 kg/m  Vitals:   12/05/17 1520  BP: 120/60  Pulse: 74  Resp: 16  Temp: 98.1 F (36.7 C)  SpO2: 98%  Weight: 160 lb (72.6 kg)     Physical Exam  General Appearance:    Alert,  cooperative, no distress  HENT:   ENT exam normal, no neck nodes or sinus tenderness  Eyes:    PERRL, conjunctiva/corneas clear, EOM's intact       Lungs:     Clear to auscultation bilaterally, respirations unlabored  Heart:    Regular rate and rhythm  Neurologic:   Awake, alert, oriented x 3. No apparent focal neurological           defect.        Results for orders placed or performed in visit on 12/05/17  POCT Influenza A/B  Result Value Ref Range   Influenza A, POC Negative Negative   Influenza B, POC Negative Negative        Assessment & Plan:     1. Upper respiratory tract infection, unspecified type  - cephALEXin (KEFLEX) 500 MG capsule; Take 1 capsule (500 mg total) by mouth 4 (four) times daily for 7 days.  Dispense: 28 capsule; Refill: 0 - fluconazole (DIFLUCAN) 150 MG tablet; Take 1 tablet (150 mg total) by mouth once for 1 dose.  Dispense: 1 tablet; Refill: 0 - POCT Influenza A/B  Call if symptoms change or if not rapidly improving.     2. Cough  - HYDROcodone-homatropine (HYCODAN) 5-1.5 MG/5ML syrup; Take 5 mLs by mouth every 8 (eight) hours as needed.  Dispense: 100 mL; Refill: 0       Lelon Huh, MD  [facility]

## 2017-12-25 ENCOUNTER — Other Ambulatory Visit: Payer: Self-pay | Admitting: Family Medicine

## 2018-02-04 ENCOUNTER — Emergency Department: Payer: Medicare Other

## 2018-02-04 ENCOUNTER — Inpatient Hospital Stay
Admission: EM | Admit: 2018-02-04 | Discharge: 2018-02-10 | DRG: 389 | Disposition: A | Discharge status: Home / Self Care | Payer: Medicare Other | Attending: General Surgery | Admitting: General Surgery

## 2018-02-04 ENCOUNTER — Other Ambulatory Visit: Payer: Self-pay

## 2018-02-04 DIAGNOSIS — Z90710 Acquired absence of both cervix and uterus: Secondary | ICD-10-CM | POA: Diagnosis not present

## 2018-02-04 DIAGNOSIS — Z8673 Personal history of transient ischemic attack (TIA), and cerebral infarction without residual deficits: Secondary | ICD-10-CM

## 2018-02-04 DIAGNOSIS — Z9109 Other allergy status, other than to drugs and biological substances: Secondary | ICD-10-CM

## 2018-02-04 DIAGNOSIS — E559 Vitamin D deficiency, unspecified: Secondary | ICD-10-CM | POA: Diagnosis present

## 2018-02-04 DIAGNOSIS — Z7982 Long term (current) use of aspirin: Secondary | ICD-10-CM

## 2018-02-04 DIAGNOSIS — Z4659 Encounter for fitting and adjustment of other gastrointestinal appliance and device: Secondary | ICD-10-CM

## 2018-02-04 DIAGNOSIS — Z8249 Family history of ischemic heart disease and other diseases of the circulatory system: Secondary | ICD-10-CM

## 2018-02-04 DIAGNOSIS — K5730 Diverticulosis of large intestine without perforation or abscess without bleeding: Secondary | ICD-10-CM | POA: Diagnosis not present

## 2018-02-04 DIAGNOSIS — Z0189 Encounter for other specified special examinations: Secondary | ICD-10-CM

## 2018-02-04 DIAGNOSIS — F419 Anxiety disorder, unspecified: Secondary | ICD-10-CM | POA: Diagnosis present

## 2018-02-04 DIAGNOSIS — E871 Hypo-osmolality and hyponatremia: Secondary | ICD-10-CM | POA: Diagnosis present

## 2018-02-04 DIAGNOSIS — K219 Gastro-esophageal reflux disease without esophagitis: Secondary | ICD-10-CM | POA: Diagnosis not present

## 2018-02-04 DIAGNOSIS — K5651 Intestinal adhesions [bands], with partial obstruction: Principal | ICD-10-CM | POA: Diagnosis present

## 2018-02-04 DIAGNOSIS — Z880 Allergy status to penicillin: Secondary | ICD-10-CM | POA: Diagnosis not present

## 2018-02-04 DIAGNOSIS — E785 Hyperlipidemia, unspecified: Secondary | ICD-10-CM | POA: Diagnosis not present

## 2018-02-04 DIAGNOSIS — Z79899 Other long term (current) drug therapy: Secondary | ICD-10-CM

## 2018-02-04 DIAGNOSIS — Z881 Allergy status to other antibiotic agents status: Secondary | ICD-10-CM

## 2018-02-04 DIAGNOSIS — K56600 Partial intestinal obstruction, unspecified as to cause: Secondary | ICD-10-CM

## 2018-02-04 DIAGNOSIS — Z888 Allergy status to other drugs, medicaments and biological substances status: Secondary | ICD-10-CM | POA: Diagnosis not present

## 2018-02-04 DIAGNOSIS — I10 Essential (primary) hypertension: Secondary | ICD-10-CM | POA: Diagnosis not present

## 2018-02-04 DIAGNOSIS — K56609 Unspecified intestinal obstruction, unspecified as to partial versus complete obstruction: Secondary | ICD-10-CM | POA: Diagnosis not present

## 2018-02-04 DIAGNOSIS — R109 Unspecified abdominal pain: Secondary | ICD-10-CM | POA: Diagnosis present

## 2018-02-04 DIAGNOSIS — M479 Spondylosis, unspecified: Secondary | ICD-10-CM | POA: Diagnosis present

## 2018-02-04 DIAGNOSIS — Z4682 Encounter for fitting and adjustment of non-vascular catheter: Secondary | ICD-10-CM | POA: Diagnosis not present

## 2018-02-04 DIAGNOSIS — R1084 Generalized abdominal pain: Secondary | ICD-10-CM | POA: Diagnosis not present

## 2018-02-04 HISTORY — DX: Partial intestinal obstruction, unspecified as to cause: K56.600

## 2018-02-04 LAB — COMPREHENSIVE METABOLIC PANEL
ALT: 18 U/L (ref 0–44)
AST: 20 U/L (ref 15–41)
Albumin: 4.3 g/dL (ref 3.5–5.0)
Alkaline Phosphatase: 51 U/L (ref 38–126)
Anion gap: 8 (ref 5–15)
BUN: 12 mg/dL (ref 8–23)
CO2: 23 mmol/L (ref 22–32)
Calcium: 8.9 mg/dL (ref 8.9–10.3)
Chloride: 100 mmol/L (ref 98–111)
Creatinine, Ser: 0.82 mg/dL (ref 0.44–1.00)
GFR calc Af Amer: >60 mL/min (ref >60)
GFR calc non Af Amer: >60 mL/min (ref >60)
Glucose, Bld: 148 mg/dL — ABNORMAL HIGH (ref 70–99)
Potassium: 4.1 mmol/L (ref 3.5–5.1)
Sodium: 131 mmol/L — ABNORMAL LOW (ref 135–145)
Total Bilirubin: 1.1 mg/dL (ref 0.3–1.2)
Total Protein: 7.4 g/dL (ref 6.5–8.1)

## 2018-02-04 LAB — CBC
HCT: 45.8 % (ref 36.0–46.0)
Hemoglobin: 15.3 g/dL — ABNORMAL HIGH (ref 12.0–15.0)
MCH: 29.9 pg (ref 26.0–34.0)
MCHC: 33.4 g/dL (ref 30.0–36.0)
MCV: 89.6 fL (ref 80.0–100.0)
Platelets: 228 10*3/uL (ref 150–400)
RBC: 5.11 MIL/uL (ref 3.87–5.11)
RDW: 13.3 % (ref 11.5–15.5)
WBC: 6.1 10*3/uL (ref 4.0–10.5)
nRBC: 0 % (ref 0.0–0.2)

## 2018-02-04 LAB — LIPASE, BLOOD: Lipase: 37 U/L (ref 11–51)

## 2018-02-04 MED ORDER — HYDROMORPHONE HCL 1 MG/ML IJ SOLN
0.5000 mg | INTRAMUSCULAR | Status: DC | PRN
  Administered 2018-02-04 (×2): 0.5 mg via INTRAVENOUS
  Filled 2018-02-04: qty 1

## 2018-02-04 MED ORDER — ALPRAZOLAM 0.5 MG PO TABS
0.5000 mg | ORAL_TABLET | Freq: Two times a day (BID) | ORAL | Status: DC
  Administered 2018-02-04 – 2018-02-07 (×4): 0.5 mg via ORAL
  Filled 2018-02-04 (×5): qty 1

## 2018-02-04 MED ORDER — HYDROMORPHONE HCL 1 MG/ML IJ SOLN
INTRAMUSCULAR | Status: AC
  Administered 2018-02-04: 0.5 mg via INTRAVENOUS
  Filled 2018-02-04: qty 1

## 2018-02-04 MED ORDER — KCL IN DEXTROSE-NACL 20-5-0.45 MEQ/L-%-% IV SOLN
INTRAVENOUS | Status: DC
  Administered 2018-02-04 – 2018-02-05 (×3): via INTRAVENOUS
  Filled 2018-02-04 (×4): qty 1000

## 2018-02-04 MED ORDER — LORAZEPAM 2 MG/ML IJ SOLN
0.5000 mg | Freq: Once | INTRAMUSCULAR | Status: AC
  Administered 2018-02-04: 0.5 mg via INTRAVENOUS
  Filled 2018-02-04: qty 1

## 2018-02-04 MED ORDER — ONDANSETRON HCL 4 MG/2ML IJ SOLN
INTRAMUSCULAR | Status: AC
  Filled 2018-02-04: qty 2

## 2018-02-04 MED ORDER — HYDROMORPHONE HCL 1 MG/ML IJ SOLN: 0.5000 mg | INTRAMUSCULAR | Status: DC | PRN

## 2018-02-04 MED ORDER — ONDANSETRON 4 MG PO TBDP
4.0000 mg | ORAL_TABLET | Freq: Four times a day (QID) | ORAL | Status: DC | PRN
  Filled 2018-02-04: qty 1

## 2018-02-04 MED ORDER — ONDANSETRON HCL 4 MG/2ML IJ SOLN
4.0000 mg | Freq: Four times a day (QID) | INTRAMUSCULAR | Status: DC | PRN
  Administered 2018-02-04 – 2018-02-09 (×6): 4 mg via INTRAVENOUS
  Filled 2018-02-04 (×6): qty 2

## 2018-02-04 MED ORDER — PANTOPRAZOLE SODIUM 40 MG PO TBEC
40.0000 mg | DELAYED_RELEASE_TABLET | Freq: Every day | ORAL | Status: DC
  Administered 2018-02-07: 40 mg via ORAL
  Filled 2018-02-04 (×2): qty 1

## 2018-02-04 MED ORDER — KETOROLAC TROMETHAMINE 30 MG/ML IJ SOLN
30.0000 mg | Freq: Four times a day (QID) | INTRAMUSCULAR | Status: AC | PRN
  Administered 2018-02-04 (×2): 30 mg via INTRAVENOUS
  Filled 2018-02-04 (×2): qty 1

## 2018-02-04 MED ORDER — ENOXAPARIN SODIUM 40 MG/0.4ML ~~LOC~~ SOLN
SUBCUTANEOUS | Status: AC
  Administered 2018-02-04: 40 mg via SUBCUTANEOUS
  Filled 2018-02-04: qty 0.4

## 2018-02-04 MED ORDER — IOPAMIDOL (ISOVUE-300) INJECTION 61%
100.0000 mL | Freq: Once | INTRAVENOUS | Status: AC | PRN
  Administered 2018-02-04: 100 mL via INTRAVENOUS

## 2018-02-04 MED ORDER — MORPHINE SULFATE (PF) 4 MG/ML IV SOLN
4.0000 mg | Freq: Once | INTRAVENOUS | Status: AC
  Administered 2018-02-04: 4 mg via INTRAVENOUS
  Filled 2018-02-04: qty 1

## 2018-02-04 MED ORDER — ENOXAPARIN SODIUM 40 MG/0.4ML ~~LOC~~ SOLN
40.0000 mg | SUBCUTANEOUS | Status: DC
  Administered 2018-02-04 – 2018-02-10 (×7): 40 mg via SUBCUTANEOUS
  Filled 2018-02-04 (×7): qty 0.4

## 2018-02-04 MED ORDER — ONDANSETRON HCL 4 MG/2ML IJ SOLN
4.0000 mg | Freq: Once | INTRAMUSCULAR | Status: AC
  Administered 2018-02-04: 4 mg via INTRAVENOUS
  Filled 2018-02-04: qty 2

## 2018-02-04 MED ORDER — MORPHINE SULFATE (PF) 2 MG/ML IV SOLN
2.0000 mg | Freq: Once | INTRAVENOUS | Status: AC
  Administered 2018-02-04: 2 mg via INTRAVENOUS
  Filled 2018-02-04: qty 1

## 2018-02-04 MED ORDER — PHENOL 1.4 % MT LIQD
1.0000 | OROMUCOSAL | Status: DC | PRN
  Administered 2018-02-08 (×2): 1 via OROMUCOSAL
  Filled 2018-02-04 (×2): qty 177

## 2018-02-04 MED ORDER — CITALOPRAM HYDROBROMIDE 20 MG PO TABS
10.0000 mg | ORAL_TABLET | Freq: Every day | ORAL | Status: DC
  Filled 2018-02-04 (×3): qty 1

## 2018-02-04 NOTE — Care Management (Signed)
[facility] notified of patient's presentation to hospital.

## 2018-02-04 NOTE — ED Triage Notes (Signed)
Patient arrives with c/o upper abdominal pain that started today. Dx with [facility] Jan 2019, states pain feels similar.

## 2018-02-04 NOTE — ED Notes (Signed)
Patient transported to CT 

## 2018-02-04 NOTE — ED Notes (Signed)
Attempted to call report x2. Tiffany, (1C RN) stated that she could not accept the pt without admission orders. Explained to her that orders have been placed by the admitting surgeon ([facility] printed out a copy for verification), but floor RN still stating "she can't see them so she can't take the pt and will notify the [facility]".

## 2018-02-04 NOTE — H&P (Addendum)
[facility] SURGICAL HISTORY & PHYSICAL (cpt [phone])  HISTORY OF PRESENT ILLNESS (HPI):  72 y.o. female presented to [facility] ED today for abdominal pain. Patient reports the onset of diffuse aching abdominal pain worse near her umbilicus yesterday morning which was constant throughout the day and worsened overnight which prompted her visit to the ED. Nothing has seemed to make the pain better. She endorses associated nausea but no emesis with the pain. She believes her last bowel movement was yesterday and has had marked decrease in flatus. No complaints of fevers, chills, chest pain, SOB, or urinary changes. She does endorse a history of similar pain in January of this year in which she was diagnosed with a small bowel obstruction. During that admission, a NGT could not be placed and she improved with conservative management (NPO, IVF). Her abdominal surgical history is positive for laparotomy and colectomy in 2014, cholecystectomy, and abdominal hysterectomy. Work up in the ED this morning was again concerning for small bowel obstruction with transition point in the right-mid abdomen.   General surgery is consulted by emergency medicine physician Dr Marjean Donna, MD for evaluation and management of small bowel obstruction.     PAST MEDICAL HISTORY (PMH):  Past Medical History:  Diagnosis Date  . Anxiety   . Arthritis    lower spine  . Back pain    lower back - S/P lifting injury  . Complication of anesthesia    makes hair brittle  . GERD (gastroesophageal reflux disease)   . Hyperlipidemia   . Hypertension   . Neuromuscular disorder (HCC)    numbness legs and feet s/p lower back injury  . Partial bowel obstruction ([place]) 02/04/2018  . Stroke [name])    "mini - strokes" 2009 - no deficit  . Vertigo    none recently  . Vitamin D deficiency   . Wears contact lenses     Reviewed. Otherwise negative.   PAST SURGICAL HISTORY ([place]):  Past Surgical History:  Procedure  Laterality Date  . ABDOMINAL HYSTERECTOMY  1987  . CARDIAC CATHETERIZATION  02/2007  . COLON SURGERY  03/12/2012   Dr Phylis Bougie  . COLONOSCOPY WITH PROPOFOL N/A 09/26/2014   Procedure: COLONOSCOPY WITH PROPOFOL;  Surgeon: Lucilla Lame, MD;  Location: [place];  Service: Endoscopy;  Laterality: N/A;  marker (tattoo) used in colon  . ESOPHAGEAL DILATION N/A 02/02/2016   Procedure: ESOPHAGEAL DILATION;  Surgeon: Lucilla Lame, MD;  Location: [place];  Service: Endoscopy;  Laterality: N/A;  . ESOPHAGOGASTRODUODENOSCOPY (EGD) WITH PROPOFOL N/A 02/02/2016   Procedure: ESOPHAGOGASTRODUODENOSCOPY (EGD) WITH PROPOFOL;  Surgeon: Lucilla Lame, MD;  Location: [place];  Service: Endoscopy;  Laterality: N/A;  . POLYPECTOMY  09/26/2014   Procedure: POLYPECTOMY;  Surgeon: Lucilla Lame, MD;  Location: [place];  Service: Endoscopy;;  . [place]    Reviewed. Otherwise negative.   MEDICATIONS:  Prior to Admission medications   Medication Sig Start Date End Date Taking? Authorizing Provider  ALPRAZolam (XANAX) 0.5 MG tablet TAKE 1 TABLET BY MOUTH THREE TIMES A DAY AS NEEDED FOR ANXIETY Patient taking differently: Take 0.5 mg by mouth 2 (two) times daily.  12/25/17  Yes Birdie Sons, MD  aspirin EC 81 MG tablet Take 81 mg by mouth daily.   Yes [provider]  citalopram (CELEXA) 20 MG tablet Take 1 tablet (20 mg total) daily by mouth. Patient taking differently: Take 10 mg by mouth daily.  01/04/17  Yes Lelon Huh  E, MD  omeprazole (PRILOSEC) 20 MG capsule Take 20 mg by mouth daily as needed (reflex).    Yes [provider]  atorvastatin (LIPITOR) 80 MG tablet Take 1 tablet (80 mg total) by mouth daily. Patient not taking: Reported on 02/04/2018 06/30/17   Birdie Sons, MD  HYDROcodone-homatropine [facility]) 5-1.5 MG/5ML syrup Take 5 mLs by mouth every 8 (eight) hours as needed. Patient not taking: Reported on 02/04/2018 12/05/17    Birdie Sons, MD  ibuprofen (ADVIL,MOTRIN) 800 MG tablet Take 1 tablet (800 mg total) by mouth every 8 (eight) hours as needed. Patient not taking: Reported on 02/04/2018 08/11/17   Carmon Ginsberg, PA  nystatin cream (MYCOSTATIN) Apply 1 application topically 2 (two) times daily. Mix with equal amounts of hydrocortisone cream Patient not taking: Reported on 02/04/2018 02/06/17   Carmon Ginsberg, PA     ALLERGIES:  Allergies  Allergen Reactions  . Levofloxacin     Tongue and mouth swelling  . Calcium Channel Blockers     Other reaction(s): Dizziness  . Amoxicillin Other (See Comments)    Has patient had a PCN reaction causing immediate rash, facial/tongue/throat swelling, SOB or lightheadedness with hypotension: No Has patient had a PCN reaction causing severe rash involving mucus membranes or skin necrosis: No Has patient had a PCN reaction that required hospitalization: No Has patient had a PCN reaction occurring within the last 10 years: No If all of the above answers are "NO", then may proceed with Cephalosporin use.   . Nickel Rash  . Penicillins Other (See Comments)    Has patient had a PCN reaction causing immediate rash, facial/tongue/throat swelling, SOB or lightheadedness with hypotension: No Has patient had a PCN reaction causing severe rash involving mucus membranes or skin necrosis: No Has patient had a PCN reaction that required hospitalization: No Has patient had a PCN reaction occurring within the last 10 years: No If all of the above answers are "NO", then may proceed with Cephalosporin use.     SOCIAL HISTORY:  Social History   Socioeconomic History  . Marital status: Married    Spouse name: Not on file  . Number of children: 3  . Years of education: Not on file  . Highest education level: 12th grade  Occupational History  . Not on file  Social Needs  . Financial resource strain: Not hard at all  . Food insecurity:    Worry: Never true    Inability:  Never true  . Transportation needs:    Medical: No    Non-medical: No  Tobacco Use  . Smoking status: Never Smoker  . Smokeless tobacco: Never Used  Substance and Sexual Activity  . Alcohol use: No    Alcohol/week: 0.0 standard drinks  . Drug use: No  . Sexual activity: Not on file  Lifestyle  . Physical activity:    Days per week: Not on file    Minutes per session: Not on file  . Stress: Very much  Relationships  . Social connections:    Talks on phone: Not on file    Gets together: Not on file    Attends religious service: Not on file    Active member of club or organization: Not on file    Attends meetings of clubs or organizations: Not on file    Relationship status: Not on file  . Intimate partner violence:    Fear of current or ex partner: Not on file    Emotionally abused: Not  on file    Physically abused: Not on file    Forced sexual activity: Not on file  Other Topics Concern  . Not on file  Social History Narrative  . Not on file     FAMILY HISTORY:  Family History  Problem Relation Age of Onset  . Diabetes Mother   . Heart disease Mother   . Hypertension Mother   . Mental illness Mother   . Cancer Father        lung cancer  . Drug abuse Brother   . Multiple sclerosis Brother     Otherwise negative.   REVIEW OF SYSTEMS:  Review of Systems  Constitutional: Negative for chills and fever.  Respiratory: Negative for cough and shortness of breath.   Cardiovascular: Negative for chest pain and palpitations.  Gastrointestinal: Positive for abdominal pain and nausea. Negative for blood in stool, constipation, diarrhea and vomiting.  Genitourinary: Negative for dysuria and hematuria.  Musculoskeletal: Negative for falls and myalgias.  Neurological: Negative for dizziness and headaches.  All other systems reviewed and are negative.   VITAL SIGNS:  Temp:  [97.5 F (36.4 C)-97.7 F (36.5 C)] 97.5 F (36.4 C) (12/18 0545) Pulse Rate:  [64-79] 68 (12/18  0545) Resp:  [11-18] 18 (12/18 0545) BP: (105-133)/(61-87) 109/69 (12/18 0545) SpO2:  [94 %-100 %] 100 % (12/18 0545) Weight:  [72.6 kg] 72.6 kg (12/18 0545)       Weight: 72.6 kg     PHYSICAL EXAM:  Physical Exam Constitutional:      General: She is not in acute distress.    Appearance: She is well-developed. She is not ill-appearing.  HENT:     Head: Normocephalic and atraumatic.  Eyes:     General: No scleral icterus.    Extraocular Movements: Extraocular movements intact.  Cardiovascular:     Rate and Rhythm: Normal rate and regular rhythm.     Heart sounds: Normal heart sounds. No murmur. No friction rub. No gallop.   Pulmonary:     Effort: Pulmonary effort is normal. No respiratory distress.     Breath sounds: Normal breath sounds. No wheezing or rhonchi.  Abdominal:     General: Abdomen is flat. A surgical scar is present. There is no distension.     Palpations: Abdomen is soft.     Tenderness: There is abdominal tenderness in the periumbilical area. There is no guarding or rebound.     Comments: Remotes Midline laparotomy incision, well healed  Genitourinary:    Comments: Deferred Musculoskeletal:     Comments: No peripheral edema appreciated, No calf tenderness  Skin:    General: Skin is warm and dry.     Coloration: Skin is not jaundiced.  Neurological:     General: No focal deficit present.     Mental Status: She is alert and oriented to person, place, and time.  Psychiatric:        Mood and Affect: Mood normal.        Behavior: Behavior normal.     INTAKE/OUTPUT:  This shift: No intake/output data recorded.  Last 2 shifts: @IOLAST2SHIFTS @  Labs:  CBC Latest Ref Rng & Units 02/04/2018 08/03/2017 03/14/2017  WBC 4.0 - 10.5 K/uL 6.1 5.7 6.1  Hemoglobin 12.0 - 15.0 g/dL 15.3(H) 14.8 13.8  Hematocrit 36.0 - 46.0 % 45.8 43.9 40.1  Platelets 150 - 400 K/uL 228 214 220   CMP Latest Ref Rng & Units 02/04/2018 10/01/2017 08/03/2017  Glucose 70 - 99 mg/dL 148(H) 88  121(H)  BUN 8 - 23 mg/dL 12 11 18   Creatinine 0.44 - 1.00 mg/dL 0.82 0.87 0.73  Sodium 135 - 145 mmol/L 131(L) 138 136  Potassium 3.5 - 5.1 mmol/L 4.1 4.5 3.3(L)  Chloride 98 - 111 mmol/L 100 103 105  CO2 22 - 32 mmol/L 23 22 23   Calcium 8.9 - 10.3 mg/dL 8.9 9.2 8.8(L)  Total Protein 6.5 - 8.1 g/dL 7.4 7.2 7.2  Total Bilirubin 0.3 - 1.2 mg/dL 1.1 0.8 0.9  Alkaline Phos 38 - 126 U/L 51 69 58  AST 15 - 41 U/L 20 21 26   ALT 0 - 44 U/L 18 17 17      Imaging studies:   -- CT Abdomen/Pelvis on 02/04/18:  IMPRESSION: 1. Small bowel obstruction with transition point in the right central abdomen, likely due to adhesions. 2. Colonic diverticulosis without diverticulitis. 3. Stable splenic lesions dating back to 2016, considered benign. 4.  Aortic Atherosclerosis (ICD10-I70.0) without aneurysm   Assessment/Plan: (ICD-10's: K58.51) 72 y.o. female with a recurrent partial small bowel obstruction most likely attributable to post-surgical adhesions given her significant surgical history and transition point on imaging studies, which is complicated by pertinent comorbidities including anxiety, HTN, HLD, TIA (2009), vitamin D deficiency, and somewhat advanced age (65).    - NPO + IVF  - Pain control as needed (avoid narcotics if possible), anti-emetics as needed  - Continue to monitor abdominal examination and on-going bowel function  - Recommend placement of NGT for decompression which she was reluctant to but eventually agreeable. Will attempt to place.   - No indication for emergent surgical intervention currently, however, she understands if she does not improve with conservative management she may require surgical intervention.   - Medical management of comorbidities  - Mobilize as tolerates  - DVT prophylaxis  All of the above findings and recommendations were discussed with the patient, and all of her questions were answered to her expressed satisfaction.  -- Edison Simon,  PA-C [facility] 02/04/2018, 8:20 AM [phone] M-F: 7am - 4pm  I saw and evaluated the patient.  I agree with the above documentation, exam, and plan, which I have edited where appropriate. Fredirick Maudlin  12:37 PM  02/04/18

## 2018-02-04 NOTE — Care Management (Signed)
RNCM attempted to meet with patient however patient did not feel well and CM expressed hopes she feels better soon and that I would be back to see her.  Home health list per CMS.gov with 3-5 star ratings along with my contact information left at bedside.

## 2018-02-04 NOTE — ED Provider Notes (Signed)
[facility] Emergency Department Provider Note ________   First MD Initiated Contact with Patient 02/04/18 [phone]     (approximate)  I have reviewed the triage vital signs and the nursing notes.   HISTORY  Chief Complaint Abdominal Pain    HPI [name] is a 72 y.o. female with below list of chronic medical conditions including bowel obstruction January 2019 presents to the emergency department with acute onset of generalized abdominal discomfort with associated nausea.  Patient denies any fever.  Patient denies any diarrhea or constipation.  Patient denies any urinary symptoms.  Past Medical History:  Diagnosis Date  . Anxiety   . Arthritis    lower spine  . Back pain    lower back - S/P lifting injury  . Complication of anesthesia    makes hair brittle  . GERD (gastroesophageal reflux disease)   . Hyperlipidemia   . Hypertension   . Neuromuscular disorder (HCC)    numbness legs and feet s/p lower back injury  . Stroke [facility])    "mini - strokes" 2009 - no deficit  . Vertigo    none recently  . Vitamin D deficiency   . Wears contact lenses     Patient Active Problem List   Diagnosis Date Noted  . Partial small bowel obstruction ([place]) 02/04/2018  . SBO (small bowel obstruction) ([place]) 03/13/2017  . Panic disorder 02/21/2016  . Weakness of both lower extremities 02/21/2016  . Depression 02/21/2016  . Problems with swallowing and mastication   . Stricture and stenosis of esophagus   . 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 Acid reflux 10/20/2014  . Hx of colonic polyps   . Benign neoplasm of transverse colon   . Diverticulosis of large intestine without diverticulitis   . Allergic rhinitis 08/01/2014  . Anxiety 08/01/2014  . HLD (hyperlipidemia) 08/01/2014  . BP (high blood pressure) 08/01/2014  . Vitamin D deficiency 08/01/2014  . H/O adenomatous polyp of colon 01/22/2012    Past Surgical History:  Procedure Laterality Date  .  ABDOMINAL HYSTERECTOMY  1987  . CARDIAC CATHETERIZATION  02/2007  . COLON SURGERY  03/12/2012   Dr Phylis Bougie  . COLONOSCOPY WITH PROPOFOL N/A 09/26/2014   Procedure: COLONOSCOPY WITH PROPOFOL;  Surgeon: Lucilla Lame, MD;  Location: [place];  Service: Endoscopy;  Laterality: N/A;  marker (tattoo) used in colon  . ESOPHAGEAL DILATION N/A 02/02/2016   Procedure: ESOPHAGEAL DILATION;  Surgeon: Lucilla Lame, MD;  Location: [place];  Service: Endoscopy;  Laterality: N/A;  . ESOPHAGOGASTRODUODENOSCOPY (EGD) WITH PROPOFOL N/A 02/02/2016   Procedure: ESOPHAGOGASTRODUODENOSCOPY (EGD) WITH PROPOFOL;  Surgeon: Lucilla Lame, MD;  Location: [place];  Service: Endoscopy;  Laterality: N/A;  . POLYPECTOMY  09/26/2014   Procedure: POLYPECTOMY;  Surgeon: Lucilla Lame, MD;  Location: [place];  Service: Endoscopy;;  . [place]    Prior to Admission medications   Medication Sig Start Date End Date Taking? Authorizing Provider  ALPRAZolam (XANAX) 0.5 MG tablet TAKE 1 TABLET BY MOUTH THREE TIMES A DAY AS NEEDED FOR ANXIETY Patient taking differently: Take 0.5 mg by mouth 2 (two) times daily.  12/25/17  Yes Birdie Sons, MD  aspirin EC 81 MG tablet Take 81 mg by mouth daily.   Yes [provider]  citalopram (CELEXA) 20 MG tablet Take 1 tablet (20 mg total) daily by mouth. Patient taking differently: Take 10 mg by mouth daily.  01/04/17  Yes Birdie Sons,  MD  omeprazole (PRILOSEC) 20 MG capsule Take 20 mg by mouth daily as needed (reflex).    Yes [provider]  atorvastatin (LIPITOR) 80 MG tablet Take 1 tablet (80 mg total) by mouth daily. Patient not taking: Reported on 02/04/2018 06/30/17   Birdie Sons, MD  HYDROcodone-homatropine [facility]) 5-1.5 MG/5ML syrup Take 5 mLs by mouth every 8 (eight) hours as needed. Patient not taking: Reported on 02/04/2018 12/05/17   Birdie Sons, MD  ibuprofen (ADVIL,MOTRIN) 800 MG tablet Take 1  tablet (800 mg total) by mouth every 8 (eight) hours as needed. Patient not taking: Reported on 02/04/2018 08/11/17   Carmon Ginsberg, PA  nystatin cream (MYCOSTATIN) Apply 1 application topically 2 (two) times daily. Mix with equal amounts of hydrocortisone cream Patient not taking: Reported on 02/04/2018 02/06/17   Carmon Ginsberg, PA    Allergies Levofloxacin; Calcium channel blockers; Amoxicillin; Nickel; and Penicillins  Family History  Problem Relation Age of Onset  . Diabetes Mother   . Heart disease Mother   . Hypertension Mother   . Mental illness Mother   . Cancer Father        lung cancer  . Drug abuse Brother   . Multiple sclerosis Brother     Social History Social History   Tobacco Use  . Smoking status: Never Smoker  . Smokeless tobacco: Never Used  Substance Use Topics  . Alcohol use: No    Alcohol/week: 0.0 standard drinks  . Drug use: No    Review of Systems Constitutional: No fever/chills Eyes: No visual changes. ENT: No sore throat. Cardiovascular: Denies chest pain. Respiratory: Denies shortness of breath. Gastrointestinal: Positive for abdominal pain and nausea no vomiting.  No diarrhea.  No constipation. Genitourinary: Negative for dysuria. Musculoskeletal: Negative for neck pain.  Negative for back pain. Integumentary: Negative for rash. Neurological: Negative for headaches, focal weakness or numbness.  ____________________________________________   PHYSICAL EXAM:  VITAL SIGNS: ED Triage Vitals  Enc Vitals Group     BP 02/04/18 0200 121/86     Pulse Rate 02/04/18 0133 71     Resp 02/04/18 0200 16     Temp 02/04/18 0133 97.7 F (36.5 C)     Temp Source 02/04/18 0133 Oral     SpO2 02/04/18 0133 100 %     Weight 02/04/18 0545 72.6 kg (160 lb 0.9 oz)     Height --      Head Circumference --      Peak Flow --      Pain Score 02/04/18 0131 8     Pain Loc --      Pain Edu? --      Excl. in [place]? --     Constitutional: Alert and  oriented.  Apparent discomfort  eyes: Conjunctivae are normal. Mouth/Throat: Mucous membranes are moist.  Oropharynx non-erythematous. Neck: No stridor.   Cardiovascular: Normal rate, regular rhythm. Good peripheral circulation. Grossly normal heart sounds. Respiratory: Normal respiratory effort.  No retractions. Lungs CTAB. Gastrointestinal: Generalized tenderness to palpation.. No distention.  Musculoskeletal: No lower extremity tenderness nor edema. No gross deformities of extremities. Neurologic:  Normal speech and language. No gross focal neurologic deficits are appreciated.  Skin:  Skin is warm, dry and intact. No rash noted. Psychiatric: Mood and affect are normal. Speech and behavior are normal.  ____________________________________________   LABS (all labs ordered are listed, but only abnormal results are displayed)  Labs Reviewed  COMPREHENSIVE METABOLIC PANEL - Abnormal; Notable for the following components:  Result Value   Sodium 131 (*)    Glucose, Bld 148 (*)    All other components within normal limits  CBC - Abnormal; Notable for the following components:   Hemoglobin 15.3 (*)    All other components within normal limits  LIPASE, BLOOD  HIV ANTIBODY (ROUTINE TESTING W REFLEX)   _____________________  RADIOLOGY I, Gregor Hams, personally viewed and evaluated these images (plain radiographs) as part of my medical decision making, as well as reviewing the written report by the radiologist.  ED MD interpretation: Small bowel obstruction with transition point in the right central abdomen likely due to adhesions per radiologist.  Official radiology report(s): Ct Abdomen Pelvis W Contrast  Result Date: 02/04/2018 CLINICAL DATA:  Acute abdominal pain. EXAM: CT ABDOMEN AND PELVIS WITH CONTRAST TECHNIQUE: Multidetector CT imaging of the abdomen and pelvis was performed using the standard protocol following bolus administration of intravenous contrast. CONTRAST:   126mL ISOVUE-300 IOPAMIDOL (ISOVUE-300) INJECTION 61% COMPARISON:  CT 03/13/2017, most remote CT 04/03/2014 FINDINGS: Lower chest: Mild hypoventilatory change at the lung bases. Hepatobiliary: No focal liver abnormality is seen. Status post cholecystectomy. No biliary dilatation. Pancreas: No ductal dilatation or inflammation. Spleen: Unchanged low-density lesions in the spleen, largest measuring 17 mm, stable from 2016. No perisplenic fluid. Adrenals/Urinary Tract: Normal adrenal glands. No hydronephrosis or perinephric edema. Homogeneous renal enhancement with symmetric excretion on delayed phase imaging. Urinary bladder is physiologically distended without wall thickening. Stomach/Bowel: Stomach distended with ingested contents. Dilated fluid-filled small bowel. Transition point in the right central abdomen, image 43 series 2, image 27 series 5. Mild associated mesenteric swirling but no pneumatosis or abnormal enhancement. Mild mesenteric edema and free fluid. Enteric sutures in the transverse colon which is unremarkable. Diverticulosis of the descending and sigmoid colon without diverticulitis. The appendix is not confidently visualized. Vascular/Lymphatic: Aortic atherosclerosis without aneurysm. No periaortic stranding. Contrast mixing in the IVC at the level of the renal veins. No adenopathy. Reproductive: Status post hysterectomy. No adnexal masses. Other: Small volume of free fluid in the pelvis is likely reactive. No free air. No intra-abdominal abscess. Musculoskeletal: There are no acute or suspicious osseous abnormalities. IMPRESSION: 1. Small bowel obstruction with transition point in the right central abdomen, likely due to adhesions. 2. Colonic diverticulosis without diverticulitis. 3. Stable splenic lesions dating back to 2016, considered benign. 4.  Aortic Atherosclerosis (ICD10-I70.0)  without aneurysm Electronically Signed   By: Keith Rake M.D.   On: 02/04/2018 02:47       Procedures   ____________________________________________   INITIAL IMPRESSION / ASSESSMENT AND PLAN / ED COURSE  As part of my medical decision making, I reviewed the following data within the electronic MEDICAL RECORD NUMBER   72 year old female presented to the emergency department with above-stated history and physical exam concerning for possible small bowel obstruction which was confirmed on CT.  Patient given IV morphine and Zofran after my initial evaluation.  On reevaluation patient states that pain is much improved and no nausea or vomiting at this time.  Patient requested to forego NG tube.  Patient discussed with Dr. Celine Ahr general surgeon on-call who will admit the patient for further evaluation and management. ____________________________________________  FINAL CLINICAL IMPRESSION(S) / ED DIAGNOSES  Final diagnoses:  Small bowel obstruction ([place])     MEDICATIONS GIVEN DURING THIS VISIT:  Medications  enoxaparin (LOVENOX) injection 40 mg (40 mg Subcutaneous Given 02/04/18 0440)  dextrose 5 % and 0.45 % NaCl with KCl 20 mEq/L infusion ( Intravenous Transfusing/Transfer  02/04/18 0531)  HYDROmorphone (DILAUDID) injection 0.5 mg (0.5 mg Intravenous Given 02/04/18 0424)  ondansetron (ZOFRAN-ODT) disintegrating tablet 4 mg ( Oral See Alternative 02/04/18 0424)    Or  ondansetron (ZOFRAN) injection 4 mg (4 mg Intravenous Given 02/04/18 0424)  ALPRAZolam (XANAX) tablet 0.5 mg (0.5 mg Oral Given 02/04/18 0622)  morphine 2 MG/ML injection 2 mg (2 mg Intravenous Given 02/04/18 0156)  ondansetron (ZOFRAN) injection 4 mg (4 mg Intravenous Given 02/04/18 0152)  iopamidol (ISOVUE-300) 61 % injection 100 mL (100 mLs Intravenous Contrast Given 02/04/18 0216)  morphine 4 MG/ML injection 4 mg (4 mg Intravenous Given 02/04/18 0248)     ED Discharge Orders    None       Note:  This document was prepared using Dragon voice recognition software and may include unintentional  dictation errors.    Gregor Hams, MD 02/04/18 0630

## 2018-02-04 NOTE — Progress Notes (Signed)
PT Cancellation Note  Patient Details Name: [name] MRN: [id number] DOB: [birth year]   Cancelled Treatment:    Reason Eval/Treat Not Completed: Pain limiting ability to participate;Other (comment)(Upon entering room, pt family member/pt requesting pain medication due to pt's increased abdominal pain. Pt requesting PT reattempt at a later time. Nursing aware pt requesting pain medication. PT will follow up as able.)  Lieutenant Diego PT, DPT 1:28 PM,02/04/18 [phone]

## 2018-02-04 NOTE — Progress Notes (Signed)
Spoke with Dr. Celine Ahr to inform her that patient is requesting her home dose of Xanax 0.5 mg at 0600 and 1800. Order received to order home dose.

## 2018-02-05 ENCOUNTER — Inpatient Hospital Stay: Payer: Medicare Other

## 2018-02-05 LAB — HIV ANTIBODY (ROUTINE TESTING W REFLEX): HIV Screen 4th Generation wRfx: NONREACTIVE

## 2018-02-05 LAB — CBC
HCT: 47.1 % — ABNORMAL HIGH (ref 36.0–46.0)
Hemoglobin: 15.8 g/dL — ABNORMAL HIGH (ref 12.0–15.0)
MCH: 30.9 pg (ref 26.0–34.0)
MCHC: 33.5 g/dL (ref 30.0–36.0)
MCV: 92 fL (ref 80.0–100.0)
Platelets: 262 10*3/uL (ref 150–400)
RBC: 5.12 MIL/uL — ABNORMAL HIGH (ref 3.87–5.11)
RDW: 13.5 % (ref 11.5–15.5)
WBC: 9.1 10*3/uL (ref 4.0–10.5)
nRBC: 0 % (ref 0.0–0.2)

## 2018-02-05 LAB — BASIC METABOLIC PANEL
Anion gap: 7 (ref 5–15)
BUN: 15 mg/dL (ref 8–23)
CO2: 21 mmol/L — ABNORMAL LOW (ref 22–32)
Calcium: 8.7 mg/dL — ABNORMAL LOW (ref 8.9–10.3)
Chloride: 106 mmol/L (ref 98–111)
Creatinine, Ser: 1.02 mg/dL — ABNORMAL HIGH (ref 0.44–1.00)
GFR calc Af Amer: >60 mL/min (ref >60)
GFR calc non Af Amer: 56 mL/min — ABNORMAL LOW (ref >60)
Glucose, Bld: 136 mg/dL — ABNORMAL HIGH (ref 70–99)
Potassium: 5 mmol/L (ref 3.5–5.1)
Sodium: 134 mmol/L — ABNORMAL LOW (ref 135–145)

## 2018-02-05 MED ORDER — DEXTROSE-NACL 5-0.45 % IV SOLN
INTRAVENOUS | Status: DC
  Administered 2018-02-05 – 2018-02-07 (×5): via INTRAVENOUS

## 2018-02-05 MED ORDER — PROMETHAZINE HCL 25 MG/ML IJ SOLN
12.5000 mg | Freq: Four times a day (QID) | INTRAMUSCULAR | Status: DC | PRN
  Administered 2018-02-05 – 2018-02-08 (×2): 12.5 mg via INTRAVENOUS
  Filled 2018-02-05 (×2): qty 1

## 2018-02-05 NOTE — Progress Notes (Addendum)
[facility] SURGICAL PROGRESS NOTE (cpt [phone])  Hospital Day(s): 1.   Post op day(s):  [name]   Interval History: Patient seen and examined, no acute events or new complaints overnight. NGT was removed this morning in anticipation of changing it to a large size. However, patient reports that her abdominal pain has resolved. She notes that her nausea has improved and only gets it "every 6-7 hours" and she denied any emesis. She endorses passing flatus "several times" but no BM. No other additional complaints this morning. She did not work with PT yesterday secondary to pain.  Review of Systems:  Constitutional: denies fever, chills  HEENT: denies cough or congestion  Respiratory: denies any shortness of breath  Cardiovascular: denies chest pain or palpitations  Gastrointestinal: denies abdominal pain, + intermittent Nausea, denied vomitting, or diarrhea/and bowel function as per interval history Genitourinary: denies burning with urination or urinary frequency Integumentary: denies any other rashes or skin discolorations except previous midline laparotomy   Vital signs in last 24 hours: [min-max] current  Temp:  [97.4 F (36.3 C)-98.4 F (36.9 C)] 98.1 F (36.7 C) (12/19 0439) Pulse Rate:  [63-81] 81 (12/19 0439) Resp:  [14-18] 14 (12/19 0439) BP: (128-158)/(72-88) 134/80 (12/19 0439) SpO2:  [97 %-100 %] 97 % (12/19 0439)     Height: 5\' 4"  (162.6 cm) Weight: 72.6 kg     Intake/Output this shift:  No intake/output data recorded.   Intake/Output last 2 shifts:  @IOLAST2SHIFTS @   Physical Exam:  Constitutional: alert, cooperative and no distress  HENT: normocephalic without obvious abnormality  Eyes: EOM's grossly intact and symmetric  Respiratory: breathing non-labored at rest  Gastrointestinal: soft, non-tender, and non-distended. Previous midline laparotomy scar present   Labs:  CBC Latest Ref Rng & Units 02/05/2018 02/04/2018 08/03/2017  WBC 4.0 - 10.5 K/uL 9.1  6.1 5.7  Hemoglobin 12.0 - 15.0 g/dL 15.8(H) 15.3(H) 14.8  Hematocrit 36.0 - 46.0 % 47.1(H) 45.8 43.9  Platelets 150 - 400 K/uL 262 228 214   CMP Latest Ref Rng & Units 02/05/2018 02/04/2018 10/01/2017  Glucose 70 - 99 mg/dL 136(H) 148(H) 88  BUN 8 - 23 mg/dL 15 12 11   Creatinine 0.44 - 1.00 mg/dL 1.02(H) 0.82 0.87  Sodium 135 - 145 mmol/L 134(L) 131(L) 138  Potassium 3.5 - 5.1 mmol/L 5.0 4.1 4.5  Chloride 98 - 111 mmol/L 106 100 103  CO2 22 - 32 mmol/L 21(L) 23 22  Calcium 8.9 - 10.3 mg/dL 8.7(L) 8.9 9.2  Total Protein 6.5 - 8.1 g/dL - 7.4 7.2  Total Bilirubin 0.3 - 1.2 mg/dL - 1.1 0.8  Alkaline Phos 38 - 126 U/L - 51 69  AST 15 - 41 U/L - 20 21  ALT 0 - 44 U/L - 18 17    Assessment/Plan: (ICD-10's: K42.51) 72 y.o. 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    - NGT removed this morning in anticipation of re-insertion for larger NGT, however, clinically patient seems improved so will leave NGT out for now. She understands that if she developed worsening pain, distension, nausea, or emesis then this needs to be re-inserted.    - Continue NPO + IVF  - Continue to monitor abdominal examination and on-going bowel function  - Pain control as  needed (minimize narcotics if possible) + anti-emetics PRN   - No indication for emergent surgical intervention currently, however, she understands if she does not improve with conservative management she may require surgical intervention.              - Medical management of comorbidities             - Mobilize as tolerates             - DVT prophylaxis   All of the above findings and recommendations were discussed with the patient, and  the medical team, and all of patient's questions were answered to her expressed satisfaction.  -- Edison Simon, PA-C [facility] 02/05/2018, 10:58 AM [phone] M-F: 7am - 4pm  I saw and evaluated the patient.  I agree with the above documentation, exam, and plan, which I have edited where appropriate. Fredirick Maudlin  11:26 AM

## 2018-02-05 NOTE — Evaluation (Signed)
Physical Therapy Evaluation Patient Details Name: [name] MRN: [id number] DOB: 09-13-1945 Today's Date: 02/05/2018   History of Present Illness  Pt is 72 yo female admitted for small bowel obstruction. PMH of laparotomy and colectomy in 2014, cholecystectomy, hysterectomy, back pain, GERD, HLD, HTN, stroke  Clinical Impression  Patient in bed complaining of stomach pain at start of session 8/10 but willing to work with PT with encouragement. Pt reported living in 1 story house with husband, previously independent in ADLs, IADLs, ambulation, drives.   Patient demonstrated bed mobility mod I due to use of bed rails, transferred with supervision/CGA. Ambulated ~268ft with IV pole for support, gait WFLs though gait velocity slightly decreased. Patient exhibited mild deficits in gait/mobility (does not use AD at baseline, balance, decreased gait speed, decreased activity tolerance due to pain) and would benefit from skilled PT intervention to return to PLOF. Recommendation is outpatient PT.    Follow Up Recommendations Outpatient PT    Equipment Recommendations  None recommended by PT;Other (comment)(PT reported having single point cane at home)    Recommendations for Other Services       Precautions / Restrictions Precautions Precautions: Fall Restrictions Weight Bearing Restrictions: No      Mobility  Bed Mobility Overal bed mobility: Modified Independent             General bed mobility comments: Pt preferred to reach for handheld assist though not required  Transfers Overall transfer level: Needs assistance   Transfers: Sit to/from Stand Sit to Stand: Supervision;Min guard         General transfer comment: CGA first attempt due to pt complaints of stomach pain  Ambulation/Gait Ambulation/Gait assistance: Supervision;Min guard Gait Distance (Feet): 200 Feet Assistive device: IV Pole       General Gait Details: decreased gait velocity but otherwise  WFLs  Stairs            Wheelchair Mobility    Modified Rankin (Stroke Patients Only)       Balance Overall balance assessment: Needs assistance Sitting-balance support: Feet supported Sitting balance-Leahy Scale: Good       Standing balance-Leahy Scale: Fair                               Pertinent Vitals/Pain Pain Assessment: 0-10 Pain Score: 8  Pain Location: abdominal pain Pain Descriptors / Indicators: Aching;Discomfort Pain Intervention(s): Monitored during session;Repositioned    Home Living Family/patient expects to be discharged to:: Private residence Living Arrangements: Spouse/significant other Available Help at Discharge: Family Type of Home: House Home Access: Stairs to enter Entrance Stairs-Rails: None Entrance Stairs-Number of Steps: 2 Home Layout: One level Home Equipment: Cane - single point      Prior Function Level of Independence: Independent         Comments: Pt reported no falls recently. husband works full time, but family is available to assist as needed     Hand Dominance   Dominant Hand: Right    Extremity/Trunk Assessment   Upper Extremity Assessment Upper Extremity Assessment: Overall WFL for tasks assessed    Lower Extremity Assessment Lower Extremity Assessment: Overall WFL for tasks assessed    Cervical / Trunk Assessment Cervical / Trunk Assessment: Normal  Communication   Communication: No difficulties  Cognition Arousal/Alertness: Awake/alert Behavior During Therapy: WFL for tasks assessed/performed Overall Cognitive Status: Within Functional Limits for tasks assessed  General Comments      Exercises     Assessment/Plan    PT Assessment Patient needs continued PT services  PT Problem List Decreased activity tolerance;Decreased mobility;Decreased balance       PT Treatment Interventions DME instruction;Therapeutic exercise;Gait  training;Balance training;Stair training;Neuromuscular re-education;Functional mobility training;Therapeutic activities;Patient/family education    PT Goals (Current goals can be found in the Care Plan section)  Acute Rehab PT Goals Patient Stated Goal: to decrease stomach pain PT Goal Formulation: With patient Time For Goal Achievement: 02/19/18 Potential to Achieve Goals: Good    Frequency Min 2X/week   Barriers to discharge        Co-evaluation               AM-PAC PT "6 Clicks" Mobility  Outcome Measure Help needed turning from your back to your side while in a flat bed without using bedrails?: None Help needed moving from lying on your back to sitting on the side of a flat bed without using bedrails?: None Help needed moving to and from a bed to a chair (including a wheelchair)?: None Help needed standing up from a chair using your arms (e.g., wheelchair or bedside chair)?: None Help needed to walk in hospital room?: A Little Help needed climbing 3-5 steps with a railing? : A Little 6 Click Score: 22    End of Session Equipment Utilized During Treatment: Gait belt Activity Tolerance: Patient limited by pain Patient left: in chair;with chair alarm set;with call bell/phone within reach Nurse Communication: Mobility status PT Visit Diagnosis: Difficulty in walking, not elsewhere classified (R26.2);Other abnormalities of gait and mobility (R26.89)    Time: 1405-1430 PT Time Calculation (min) (ACUTE ONLY): 25 min   Charges:   PT Evaluation $PT Eval Low Complexity: 1 Low PT Treatments $Therapeutic Activity: 8-22 mins       Lieutenant Diego PT, DPT 2:43 PM,02/05/18 [phone]

## 2018-02-06 ENCOUNTER — Inpatient Hospital Stay: Payer: Medicare Other

## 2018-02-06 LAB — BASIC METABOLIC PANEL
Anion gap: 9 (ref 5–15)
BUN: 13 mg/dL (ref 8–23)
CO2: 22 mmol/L (ref 22–32)
Calcium: 8.6 mg/dL — ABNORMAL LOW (ref 8.9–10.3)
Chloride: 102 mmol/L (ref 98–111)
Creatinine, Ser: 0.82 mg/dL (ref 0.44–1.00)
GFR calc Af Amer: >60 mL/min (ref >60)
GFR calc non Af Amer: >60 mL/min (ref >60)
Glucose, Bld: 140 mg/dL — ABNORMAL HIGH (ref 70–99)
Potassium: 4 mmol/L (ref 3.5–5.1)
Sodium: 133 mmol/L — ABNORMAL LOW (ref 135–145)

## 2018-02-06 MED ORDER — ALUM & MAG HYDROXIDE-SIMETH 200-200-20 MG/5ML PO SUSP
15.0000 mL | ORAL | Status: DC | PRN
  Administered 2018-02-06: 15 mL via ORAL
  Filled 2018-02-06: qty 30

## 2018-02-06 MED ORDER — LORAZEPAM 2 MG/ML IJ SOLN
0.5000 mg | Freq: Once | INTRAMUSCULAR | Status: AC
  Administered 2018-02-06: 0.5 mg via INTRAVENOUS
  Filled 2018-02-06: qty 1

## 2018-02-06 NOTE — Care Management Important Message (Signed)
Important Message  Patient Details  Name: [name] MRN: [id number] Date of Birth: 02/27/1945   Medicare Important Message Given:  Yes    Juliann Pulse A Karman [place] 02/06/2018, 11:12 AM

## 2018-02-06 NOTE — Progress Notes (Signed)
Cannot clinically determine the etiology of hyponatremia at this time.

## 2018-02-06 NOTE — Progress Notes (Addendum)
[facility] SURGICAL PROGRESS NOTE (cpt [phone])  Hospital Day(s): 2.   Post op day(s):  [name]   Interval History: Patient seen and examined, no acute events or new complaints overnight. Patient reports that she "does not feel very good this morning." She endorse "gas pain" in her abdomen, nausea, and frequent burping. She denied any emesis and has not passed any flatus or had a BM. She has remained NPO. Worked with physical therapy yesterday. NGT was removed yesterday as it did not appear to be functional. It was not replaced due to patient request at the time.  Review of Systems:  Constitutional: denies fever, chills  Respiratory: denies any shortness of breath  Cardiovascular: denies chest pain or palpitations  Gastrointestinal: + Abdominal pain, + nausea, denied emesis or diarrhea/and bowel function as per interval history Genitourinary: denies burning with urination or urinary frequency Integumentary: denies any other rashes or skin discolorations Neurological: denies HA or vision/hearing changes   Vital signs in last 24 hours: [min-max] current  Temp:  [98.3 F (36.8 C)-99 F (37.2 C)] 98.3 F (36.8 C) (12/20 0406) Pulse Rate:  [71-79] 76 (12/20 0406) Resp:  [16-18] 18 (12/20 0406) BP: (144-164)/(75-95) 150/95 (12/20 0406) SpO2:  [99 %-100 %] 100 % (12/20 0406)     Height: 5\' 4"  (162.6 cm) Weight: 72.6 kg     Intake/Output this shift:  No intake/output data recorded.   Intake/Output last 2 shifts:  @IOLAST2SHIFTS @   Physical Exam:  Constitutional: alert, cooperative and no distress  HENT: normocephalic without obvious abnormality  Eyes: EOM's grossly intact and symmetric  Respiratory: breathing non-labored at rest  Gastrointestinal: soft, non-tender, and non-distended. Previous midline laparotomy incision present Musculoskeletal: no edema or wounds, motor and sensation grossly intact, NT    Labs:  CBC Latest Ref Rng & Units 02/05/2018 02/04/2018 08/03/2017   WBC 4.0 - 10.5 K/uL 9.1 6.1 5.7  Hemoglobin 12.0 - 15.0 g/dL 15.8(H) 15.3(H) 14.8  Hematocrit 36.0 - 46.0 % 47.1(H) 45.8 43.9  Platelets 150 - 400 K/uL 262 228 214   CMP Latest Ref Rng & Units 02/06/2018 02/05/2018 02/04/2018  Glucose 70 - 99 mg/dL 140(H) 136(H) 148(H)  BUN 8 - 23 mg/dL 13 15 12   Creatinine 0.44 - 1.00 mg/dL 0.82 1.02(H) 0.82  Sodium 135 - 145 mmol/L 133(L) 134(L) 131(L)  Potassium 3.5 - 5.1 mmol/L 4.0 5.0 4.1  Chloride 98 - 111 mmol/L 102 106 100  CO2 22 - 32 mmol/L 22 21(L) 23  Calcium 8.9 - 10.3 mg/dL 8.6(L) 8.7(L) 8.9  Total Protein 6.5 - 8.1 g/dL - - 7.4  Total Bilirubin 0.3 - 1.2 mg/dL - - 1.1  Alkaline Phos 38 - 126 U/L - - 51  AST 15 - 41 U/L - - 20  ALT 0 - 44 U/L - - 18      Assessment/Plan: (ICD-10's:K56.51) 72 y.o.femalewith a clinically stable but not improviong recurrent partial small bowel obstruction most likely 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    - Placed 16 Fr NGT in right nare to 55 cm and secured with tape after position confirmed at bedside. Will get KUB to follow up.    - Continue NPO (Day 3) + IVF             - Continue to monitor abdominal examination and on-going bowel function             -  Pain control as needed (minimize narcotics if possible) + anti-emetics PRN              - No indication for emergent surgical intervention currently, however, she understands if she does not improve with conservative management she may require surgical intervention.  Believe she was not progressing earlier due to small bore NGT failing to function properly.    - Medical management of comorbidities - Mobilize as tolerates - DVT prophylaxis   All of the above findings and recommendations  were discussed with the patient, and the medical team, and all of patient's questions were answered to her expressed satisfaction.  -- Edison Simon, PA-C [facility] 02/06/2018, 8:34 AM [phone] M-F: 7am - 4pm  I saw and evaluated the patient.  I agree with the above documentation, exam, and plan, which I have edited where appropriate. Fredirick Maudlin  11:39 AM

## 2018-02-06 NOTE — Plan of Care (Signed)
  Problem: Education: Goal: Knowledge of General Education information will improve Description Including pain rating scale, medication(s)/side effects and non-pharmacologic comfort measures Outcome: Progressing   Problem: Health Behavior/Discharge Planning: Goal: Ability to manage health-related needs will improve Outcome: Progressing   Problem: Clinical Measurements: Goal: Will remain free from infection Outcome: Progressing Goal: Diagnostic test results will improve Outcome: Progressing   Problem: Nutrition: Goal: Adequate nutrition will be maintained Outcome: Progressing   Problem: Coping: Goal: Level of anxiety will decrease Outcome: Progressing   Problem: Pain Managment: Goal: General experience of comfort will improve Outcome: Progressing   Problem: Safety: Goal: Ability to remain free from injury will improve Outcome: Progressing   Problem: Skin Integrity: Goal: Risk for impaired skin integrity will decrease Outcome: Progressing

## 2018-02-06 NOTE — Care Management (Signed)
Outpatient PT order needed; MD updated in Epic. No DME needed per PT note.

## 2018-02-07 ENCOUNTER — Encounter (HOSPITAL_COMMUNITY): Payer: Self-pay

## 2018-02-07 MED ORDER — LORAZEPAM 2 MG/ML IJ SOLN
0.5000 mg | Freq: Four times a day (QID) | INTRAMUSCULAR | Status: DC
  Administered 2018-02-07 – 2018-02-10 (×11): 0.5 mg via INTRAVENOUS
  Filled 2018-02-07 (×11): qty 1

## 2018-02-07 MED ORDER — PANTOPRAZOLE SODIUM 40 MG IV SOLR
40.0000 mg | INTRAVENOUS | Status: DC
  Administered 2018-02-07 – 2018-02-09 (×3): 40 mg via INTRAVENOUS
  Filled 2018-02-07 (×3): qty 40

## 2018-02-07 NOTE — Plan of Care (Signed)
  Problem: Education: Goal: Knowledge of General Education information will improve Description Including pain rating scale, medication(s)/side effects and non-pharmacologic comfort measures Outcome: Progressing   Problem: Health Behavior/Discharge Planning: Goal: Ability to manage health-related needs will improve Outcome: Progressing   Problem: Clinical Measurements: Goal: Ability to maintain clinical measurements within normal limits will improve Outcome: Progressing Goal: Will remain free from infection Outcome: Progressing Goal: Diagnostic test results will improve Outcome: Progressing Goal: Cardiovascular complication will be avoided Outcome: Progressing   Problem: Activity: Goal: Risk for activity intolerance will decrease Outcome: Progressing   Problem: Nutrition: Goal: Adequate nutrition will be maintained Outcome: Progressing   Problem: Coping: Goal: Level of anxiety will decrease Outcome: Progressing   Problem: Elimination: Goal: Will not experience complications related to bowel motility Outcome: Progressing   Problem: Safety: Goal: Ability to remain free from injury will improve Outcome: Progressing

## 2018-02-07 NOTE — Progress Notes (Addendum)
Fredirick Maudlin, MD  Physician  [facility] Hospital Day(s): 3   Post op day(s):  [name]   Interval History: NGT replaced yesterday with high output. She reports having passed a small amount of gas, but still complains of abdominal tenderness.    Review of Systems:  Constitutional: denies fever, chills  Respiratory: denies any shortness of breath  Cardiovascular: denies chest pain or palpitations  Gastrointestinal: + Abdominal pain, + nausea, denied emesis  Genitourinary: denies burning with urination or urinary frequency Integumentary: denies any other rashes or skin discolorations Neurological: denies HA or vision/hearing changes   Vital signs in last 24 hours: [min-max] current  Vitals:   02/07/18 0424 02/07/18 0745  BP: 130/76 (!) 153/99  Pulse: 81 86  Resp:  18  Temp: 98.8 F (37.1 C) 97.6 F (36.4 C)  SpO2: 99% 99%     I/O last 3 completed shifts: In: 4644.8 [I.V.:4644.8] Out: 1100 [Urine:300; Emesis/NG output:800] No intake/output data recorded.    Physical Exam:  Constitutional: alert, cooperative and no distress. NGT in place with thin, green-brown drainage.  HENT: normocephalic without obvious abnormality  Eyes: EOM's grossly intact and symmetric  Respiratory: breathing non-labored at rest  Gastrointestinal: soft, non-tender, and non-distended. Previous midline laparotomy incision present Musculoskeletal: no edema or wounds, motor and sensation grossly intact, NT    Labs:  CBC Latest Ref Rng & Units 02/05/2018 02/04/2018 08/03/2017  WBC 4.0 - 10.5 K/uL 9.1 6.1 5.7  Hemoglobin 12.0 - 15.0 g/dL 15.8(H) 15.3(H) 14.8  Hematocrit 36.0 - 46.0 % 47.1(H) 45.8 43.9  Platelets 150 - 400 K/uL 262 228 214   CMP Latest Ref Rng & Units 02/06/2018 02/05/2018 02/04/2018  Glucose 70 - 99 mg/dL 140(H) 136(H) 148(H)  BUN 8 - 23 mg/dL 13 15 12   Creatinine 0.44 - 1.00 mg/dL 0.82 1.02(H) 0.82  Sodium 135 - 145 mmol/L 133(L) 134(L)  131(L)  Potassium 3.5 - 5.1 mmol/L 4.0 5.0 4.1  Chloride 98 - 111 mmol/L 102 106 100  CO2 22 - 32 mmol/L 22 21(L) 23  Calcium 8.9 - 10.3 mg/dL 8.6(L) 8.7(L) 8.9  Total Protein 6.5 - 8.1 g/dL - - 7.4  Total Bilirubin 0.3 - 1.2 mg/dL - - 1.1  Alkaline Phos 38 - 126 U/L - - 51  AST 15 - 41 U/L - - 20  ALT 0 - 44 U/L - - 18      Assessment/Plan: (ICD-10's:K56.51) 72 y.o.femalewith partial small bowel obstruction most likely attributable to post-surgical adhesions given her significant surgical history and transition point on imaging studies,which iscomplicated by pertinent comorbidities includinganxiety, HTN, HLD, TIA (2009), vitamin D deficiency, and somewhat advanced age (72).              - Tolerating NGT, still with high output. Small amount of flatus today.                - Continue NPO (Day 4) + IVF - Continue to monitor abdominal examination and on-going bowel function - Pain control as needed (minimize narcotics if possible) + anti-emetics PRN - No indication for emergent surgical intervention currently, however, she understands if she does not improve with conservative management she may require surgical intervention.  Believe she was not progressing earlier due to small bore NGT failing to function properly.    - Medical management of comorbidities - Mobilize as tolerates - DVT prophylaxis  All  of the above findings and recommendations were discussed with the patient, and the medical team, and all of patient's questions were answered to her expressed satisfaction.         Revision History

## 2018-02-07 NOTE — Plan of Care (Signed)
  Problem: Education: Goal: Knowledge of General Education information will improve Description Including pain rating scale, medication(s)/side effects and non-pharmacologic comfort measures 02/07/2018 0419 by Milas Gain, RN Outcome: Progressing 02/06/2018 2039 by Milas Gain, RN Outcome: Progressing   Problem: Health Behavior/Discharge Planning: Goal: Ability to manage health-related needs will improve 02/07/2018 0419 by Milas Gain, RN Outcome: Progressing 02/06/2018 2039 by Milas Gain, RN Outcome: Progressing   Problem: Clinical Measurements: Goal: Ability to maintain clinical measurements within normal limits will improve Outcome: Progressing Goal: Will remain free from infection 02/07/2018 0419 by Milas Gain, RN Outcome: Progressing 02/06/2018 2039 by Milas Gain, RN Outcome: Progressing Goal: Diagnostic test results will improve 02/07/2018 0419 by Milas Gain, RN Outcome: Progressing 02/06/2018 2039 by Milas Gain, RN Outcome: Progressing Goal: Respiratory complications will improve Outcome: Progressing Goal: Cardiovascular complication will be avoided Outcome: Progressing   Problem: Activity: Goal: Risk for activity intolerance will decrease Outcome: Progressing   Problem: Nutrition: Goal: Adequate nutrition will be maintained Outcome: Progressing   Problem: Coping: Goal: Level of anxiety will decrease Outcome: Progressing   Problem: Pain Managment: Goal: General experience of comfort will improve Outcome: Progressing   Problem: Safety: Goal: Ability to remain free from injury will improve Outcome: Progressing   Problem: Skin Integrity: Goal: Risk for impaired skin integrity will decrease Outcome: Progressing

## 2018-02-07 NOTE — Progress Notes (Signed)
Physical Therapy Treatment Patient Details Name: [name] MRN: [id number] DOB: Sep 22, 1945 Today's Date: 02/07/2018    History of Present Illness Pt is 72 yo female admitted for small bowel obstruction. PMH of laparotomy and colectomy in 2014, cholecystectomy, hysterectomy, back pain, GERD, HLD, HTN, stroke    PT Comments    Pt in bed, feeling poorly but wants to walk.  Bed mobility and transfers without assist.  Ambulated x 2 around unit with IV pole and HHA.  Slow steady gait.  Remained in recliner after session.   Follow Up Recommendations  Outpatient PT     Equipment Recommendations  None recommended by PT;Other (comment)    Recommendations for Other Services       Precautions / Restrictions Precautions Precautions: Fall Restrictions Weight Bearing Restrictions: No    Mobility  Bed Mobility Overal bed mobility: Modified Independent                Transfers Overall transfer level: Modified independent   Transfers: Sit to/from Stand Sit to Stand: Supervision            Ambulation/Gait Ambulation/Gait assistance: Min guard;Supervision Gait Distance (Feet): 350 Feet Assistive device: IV Pole;1 person hand held assist Gait Pattern/deviations: Step-through pattern     General Gait Details: decreased gait velocity but otherwise WFLs   Stairs             Wheelchair Mobility    Modified Rankin (Stroke Patients Only)       Balance Overall balance assessment: Needs assistance Sitting-balance support: Feet supported Sitting balance-Leahy Scale: Good     Standing balance support: Bilateral upper extremity supported Standing balance-Leahy Scale: Fair                              Cognition Arousal/Alertness: Awake/alert Behavior During Therapy: WFL for tasks assessed/performed Overall Cognitive Status: Within Functional Limits for tasks assessed                                        Exercises       General Comments        Pertinent Vitals/Pain Pain Assessment: 0-10 Pain Score: 6  Pain Location: abdominal pain - general discomfort Pain Descriptors / Indicators: Aching;Discomfort Pain Intervention(s): Limited activity within patient's tolerance;Monitored during session    Home Living                      Prior Function            PT Goals (current goals can now be found in the care plan section) Progress towards PT goals: Progressing toward goals    Frequency    Min 2X/week      PT Plan Current plan remains appropriate    Co-evaluation              AM-PAC PT "6 Clicks" Mobility   Outcome Measure  Help needed turning from your back to your side while in a flat bed without using bedrails?: None Help needed moving from lying on your back to sitting on the side of a flat bed without using bedrails?: None Help needed moving to and from a bed to a chair (including a wheelchair)?: None Help needed standing up from a chair using your arms (e.g., wheelchair or bedside chair)?: None Help needed to walk in  hospital room?: A Little Help needed climbing 3-5 steps with a railing? : A Little 6 Click Score: 22    End of Session Equipment Utilized During Treatment: Gait belt Activity Tolerance: Patient limited by pain Patient left: in chair;with call bell/phone within reach;with chair alarm set Nurse Communication: Other (comment)       Time: 1610-9604 PT Time Calculation (min) (ACUTE ONLY): 11 min  Charges:  $Gait Training: 8-22 mins                    Chesley Noon, PTA 02/07/18, 8:59 AM

## 2018-02-08 ENCOUNTER — Inpatient Hospital Stay: Payer: Medicare Other

## 2018-02-08 LAB — CBC
HCT: 46.1 % — ABNORMAL HIGH (ref 36.0–46.0)
Hemoglobin: 15.9 g/dL — ABNORMAL HIGH (ref 12.0–15.0)
MCH: 30.6 pg (ref 26.0–34.0)
MCHC: 34.5 g/dL (ref 30.0–36.0)
MCV: 88.8 fL (ref 80.0–100.0)
Platelets: 254 10*3/uL (ref 150–400)
RBC: 5.19 MIL/uL — ABNORMAL HIGH (ref 3.87–5.11)
RDW: 12.9 % (ref 11.5–15.5)
WBC: 8.6 10*3/uL (ref 4.0–10.5)
nRBC: 0 % (ref 0.0–0.2)

## 2018-02-08 LAB — BASIC METABOLIC PANEL
Anion gap: 9 (ref 5–15)
BUN: 15 mg/dL (ref 8–23)
CO2: 24 mmol/L (ref 22–32)
Calcium: 8.4 mg/dL — ABNORMAL LOW (ref 8.9–10.3)
Chloride: 98 mmol/L (ref 98–111)
Creatinine, Ser: 0.8 mg/dL (ref 0.44–1.00)
GFR calc Af Amer: >60 mL/min (ref >60)
GFR calc non Af Amer: >60 mL/min (ref >60)
Glucose, Bld: 121 mg/dL — ABNORMAL HIGH (ref 70–99)
Potassium: 2.8 mmol/L — ABNORMAL LOW (ref 3.5–5.1)
Sodium: 131 mmol/L — ABNORMAL LOW (ref 135–145)

## 2018-02-08 LAB — PHOSPHORUS: Phosphorus: 2.8 mg/dL (ref 2.5–4.6)

## 2018-02-08 LAB — MAGNESIUM: Magnesium: 2.5 mg/dL — ABNORMAL HIGH (ref 1.7–2.4)

## 2018-02-08 MED ORDER — SODIUM PHOSPHATES 45 MMOLE/15ML IV SOLN
20.0000 mmol | Freq: Once | INTRAVENOUS | Status: AC
  Administered 2018-02-08: 20 mmol via INTRAVENOUS
  Filled 2018-02-08: qty 6.67

## 2018-02-08 MED ORDER — KCL IN DEXTROSE-NACL 20-5-0.9 MEQ/L-%-% IV SOLN
INTRAVENOUS | Status: DC
  Administered 2018-02-08 – 2018-02-09 (×4): via INTRAVENOUS
  Filled 2018-02-08 (×5): qty 1000

## 2018-02-08 NOTE — Progress Notes (Signed)
Hospital day: 4  Interval History: She passed flatus yesterday.  She does not think she has passed any this morning.  Her abdominal pain has essentially resolved.  She continues to have significant NG output.  Currently her most pressing complaint is the discomfort from her nasogastric tube.  Past Medical History:  Diagnosis Date  . Anxiety   . Arthritis    lower spine  . Back pain    lower back - S/P lifting injury  . Complication of anesthesia    makes hair brittle  . GERD (gastroesophageal reflux disease)   . Hyperlipidemia   . Hypertension   . Neuromuscular disorder (HCC)    numbness legs and feet s/p lower back injury  . Partial bowel obstruction ([place]) 02/04/2018  . Stroke [facility])    "mini - strokes" 2009 - no deficit  . Vertigo    none recently  . Vitamin D deficiency   . Wears contact lenses    Past Surgical History:  Procedure Laterality Date  . ABDOMINAL HYSTERECTOMY  1987  . CARDIAC CATHETERIZATION  02/2007  . COLON SURGERY  03/12/2012   Dr Phylis Bougie  . COLONOSCOPY WITH PROPOFOL N/A 09/26/2014   Procedure: COLONOSCOPY WITH PROPOFOL;  Surgeon: Lucilla Lame, MD;  Location: [facility];  Service: Endoscopy;  Laterality: N/A;  marker (tattoo) used in colon  . ESOPHAGEAL DILATION N/A 02/02/2016   Procedure: ESOPHAGEAL DILATION;  Surgeon: Lucilla Lame, MD;  Location: [facility];  Service: Endoscopy;  Laterality: N/A;  . ESOPHAGOGASTRODUODENOSCOPY (EGD) WITH PROPOFOL N/A 02/02/2016   Procedure: ESOPHAGOGASTRODUODENOSCOPY (EGD) WITH PROPOFOL;  Surgeon: Lucilla Lame, MD;  Location: [facility];  Service: Endoscopy;  Laterality: N/A;  . POLYPECTOMY  09/26/2014   Procedure: POLYPECTOMY;  Surgeon: Lucilla Lame, MD;  Location: [facility];  Service: Endoscopy;;  . TUBAL LIGATION  1972   Family History  Problem Relation Age of Onset  . Diabetes Mother   . Heart disease Mother   . Hypertension Mother   . Mental illness Mother   . Cancer Father    lung cancer  . Drug abuse Brother   . Multiple sclerosis Brother    Social History   Tobacco Use  . Smoking status: Never Smoker  . Smokeless tobacco: Never Used  Substance Use Topics  . Alcohol use: No    Alcohol/week: 0.0 standard drinks  . Drug use: No   Current Meds  Medication Sig  . ALPRAZolam (XANAX) 0.5 MG tablet TAKE 1 TABLET BY MOUTH THREE TIMES A DAY AS NEEDED FOR ANXIETY (Patient taking differently: Take 0.5 mg by mouth 2 (two) times daily. )  . aspirin EC 81 MG tablet Take 81 mg by mouth daily.  . citalopram (CELEXA) 20 MG tablet Take 1 tablet (20 mg total) daily by mouth. (Patient taking differently: Take 10 mg by mouth daily. )  . omeprazole (PRILOSEC) 20 MG capsule Take 20 mg by mouth daily as needed (reflex).    Allergies  Allergen Reactions  . Levofloxacin     Tongue and mouth swelling  . Calcium Channel Blockers     Other reaction(s): Dizziness  . Amoxicillin Other (See Comments)    Has patient had a PCN reaction causing immediate rash, facial/tongue/throat swelling, SOB or lightheadedness with hypotension: No Has patient had a PCN reaction causing severe rash involving mucus membranes or skin necrosis: No Has patient had a PCN reaction that required hospitalization: No Has patient had a PCN reaction occurring within the last 10 years: No  If all of the above answers are "NO", then may proceed with Cephalosporin use.   . Nickel Rash  . Penicillins Other (See Comments)    Has patient had a PCN reaction causing immediate rash, facial/tongue/throat swelling, SOB or lightheadedness with hypotension: No Has patient had a PCN reaction causing severe rash involving mucus membranes or skin necrosis: No Has patient had a PCN reaction that required hospitalization: No Has patient had a PCN reaction occurring within the last 10 years: No If all of the above answers are "NO", then may proceed with Cephalosporin use.   Vitals:   02/08/18 0432 02/08/18 0738  BP: (!)  142/83 133/86  Pulse: 94 93  Resp: 18 18  Temp:  99 F (37.2 C)  SpO2: 91% 97%    Intake/Output Summary (Last 24 hours) at 02/08/2018 1325 Last data filed at 02/08/2018 1225 Gross per 24 hour  Intake 2973.34 ml  Output 2350 ml  Net 623.34 ml   NGT: 900 cc  Gen: A&O x 3. NAD. RRR Normal WOB on RA Abdomen soft, NT/ND. NGT in place; suction canister just replaced. Ext: warm  Impression and plan: This is a 72 year old woman who was admitted with a partial small bowel obstruction.  Unfortunately her initial nasogastric tube placement was insufficient and did not provide any actual decompression.  Replacement of her nasogastric tube on hospital day 2 resulted in significant output.  She has begun to pass a small amount of flatus however she continues to have relatively high output from her NG tube.  We will continue to encourage ambulation.  I will check an abdominal film to confirm NG placement with in the stomach as well as evaluate progression of her bowel obstruction.  I am still hopeful to avoid surgery however if this persists much longer we may be forced to proceed with operative intervention and adhesiolysis. [name]

## 2018-02-09 NOTE — Progress Notes (Signed)
Physical Therapy Treatment Patient Details Name: [name] MRN: [id number] DOB: 09-05-1945 Today's Date: 02/09/2018    History of Present Illness Pt is 72 yo female admitted for small bowel obstruction. PMH of laparotomy and colectomy in 2014, cholecystectomy, hysterectomy, back pain, GERD, HLD, HTN, stroke    PT Comments    Pt agreeable to PT; denies pain/problems other than mild drowsiness from recently awakening. Pt demonstrates all bed mobility and transfers (transfers from multiple surfaces). Progressing ambulation distance to 240 feet. Pt does use IV pole and intermittent light use of rail, although does not require for anything more than confidence. Pt offered use of appropriate assistive device prior to ambulation and declines. Pt able to manage toileting/personal hygiene with supervision/SBA. Continue PT to progress strength and confidence with ambulation to ensure optimal, safe return home.    Follow Up Recommendations  Outpatient PT     Equipment Recommendations  None recommended by PT;Other (comment)    Recommendations for Other Services       Precautions / Restrictions Precautions Precautions: Fall Restrictions Weight Bearing Restrictions: No    Mobility  Bed Mobility Overal bed mobility: Modified Independent                Transfers Overall transfer level: Modified independent     Sit to Stand: Modified independent (Device/Increase time)(from bed and BSC)         General transfer comment: Safe use of hands; no LOB  Ambulation/Gait Ambulation/Gait assistance: Supervision Gait Distance (Feet): 240 Feet Assistive device: IV Pole(intermittent light touch to rail although does not require) Gait Pattern/deviations: Step-through pattern   Gait velocity interpretation: 1.31 - 2.62 ft/sec, indicative of limited community ambulator General Gait Details: steady; uses IV pole and intermittent light touch on rail for confidence only; does not require.  Refuses use of any appropriate assistive device when offered   Stairs             Wheelchair Mobility    Modified Rankin (Stroke Patients Only)       Balance Overall balance assessment: No apparent balance deficits (not formally assessed)                                          Cognition Arousal/Alertness: Awake/alert(Drowsy from just awakening) Behavior During Therapy: WFL for tasks assessed/performed Overall Cognitive Status: Within Functional Limits for tasks assessed                                        Exercises Other Exercises Other Exercises: supervision/SBA during toileting/personal hygiene in stand    General Comments        Pertinent Vitals/Pain Pain Assessment: No/denies pain    Home Living                      Prior Function            PT Goals (current goals can now be found in the care plan section) Progress towards PT goals: Progressing toward goals    Frequency    Min 2X/week      PT Plan Current plan remains appropriate    Co-evaluation              AM-PAC PT "6 Clicks" Mobility   Outcome Measure  Help needed  turning from your back to your side while in a flat bed without using bedrails?: None Help needed moving from lying on your back to sitting on the side of a flat bed without using bedrails?: None Help needed moving to and from a bed to a chair (including a wheelchair)?: None Help needed standing up from a chair using your arms (e.g., wheelchair or bedside chair)?: None Help needed to walk in hospital room?: A Little Help needed climbing 3-5 steps with a railing? : A Little 6 Click Score: 22    End of Session Equipment Utilized During Treatment: Gait belt Activity Tolerance: Patient tolerated treatment well Patient left: in bed;with call bell/phone within reach;with bed alarm set;with nursing/sitter in room Nurse Communication: Mobility status PT Visit Diagnosis:  Difficulty in walking, not elsewhere classified (R26.2);Other abnormalities of gait and mobility (R26.89)     Time: 9379-0240 PT Time Calculation (min) (ACUTE ONLY): 21 min  Charges:  $Gait Training: 8-22 mins                      Larae Grooms, PTA 02/09/2018, 12:43 PM

## 2018-02-09 NOTE — Care Management Important Message (Signed)
Important Message  Patient Details  Name: [name] MRN: [id number] Date of Birth: 1945-02-22   Medicare Important Message Given:  Yes    Juliann Pulse A Othello Dickenson 02/09/2018, 11:34 AM

## 2018-02-09 NOTE — Progress Notes (Signed)
Hospital day:   Interval History: She has had several bowel movements Her abdominal pain has essentially resolved.  Currently her most pressing complaint is the discomfort from her nasogastric tube.  Past Medical History:  Diagnosis Date  . Anxiety   . Arthritis    lower spine  . Back pain    lower back - S/P lifting injury  . Complication of anesthesia    makes hair brittle  . GERD (gastroesophageal reflux disease)   . Hyperlipidemia   . Hypertension   . Neuromuscular disorder (HCC)    numbness legs and feet s/p lower back injury  . Partial bowel obstruction ([place]) 02/04/2018  . Stroke [facility])    "mini - strokes" 2009 - no deficit  . Vertigo    none recently  . Vitamin D deficiency   . Wears contact lenses    Past Surgical History:  Procedure Laterality Date  . ABDOMINAL HYSTERECTOMY  1987  . CARDIAC CATHETERIZATION  02/2007  . COLON SURGERY  03/12/2012   Dr Phylis Bougie  . COLONOSCOPY WITH PROPOFOL N/A 09/26/2014   Procedure: COLONOSCOPY WITH PROPOFOL;  Surgeon: Lucilla Lame, MD;  Location: [place];  Service: Endoscopy;  Laterality: N/A;  marker (tattoo) used in colon  . ESOPHAGEAL DILATION N/A 02/02/2016   Procedure: ESOPHAGEAL DILATION;  Surgeon: Lucilla Lame, MD;  Location: [place];  Service: Endoscopy;  Laterality: N/A;  . ESOPHAGOGASTRODUODENOSCOPY (EGD) WITH PROPOFOL N/A 02/02/2016   Procedure: ESOPHAGOGASTRODUODENOSCOPY (EGD) WITH PROPOFOL;  Surgeon: Lucilla Lame, MD;  Location: [place];  Service: Endoscopy;  Laterality: N/A;  . POLYPECTOMY  09/26/2014   Procedure: POLYPECTOMY;  Surgeon: Lucilla Lame, MD;  Location: [place];  Service: Endoscopy;;  . TUBAL LIGATION  1972   Family History  Problem Relation Age of Onset  . Diabetes Mother   . Heart disease Mother   . Hypertension Mother   . Mental illness Mother   . Cancer Father        lung cancer  . Drug abuse Brother   . Multiple sclerosis Brother    Social History    Tobacco Use  . Smoking status: Never Smoker  . Smokeless tobacco: Never Used  Substance Use Topics  . Alcohol use: No    Alcohol/week: 0.0 standard drinks  . Drug use: No   Current Meds  Medication Sig  . ALPRAZolam (XANAX) 0.5 MG tablet TAKE 1 TABLET BY MOUTH THREE TIMES A DAY AS NEEDED FOR ANXIETY (Patient taking differently: Take 0.5 mg by mouth 2 (two) times daily. )  . aspirin EC 81 MG tablet Take 81 mg by mouth daily.  . citalopram (CELEXA) 20 MG tablet Take 1 tablet (20 mg total) daily by mouth. (Patient taking differently: Take 10 mg by mouth daily. )  . omeprazole (PRILOSEC) 20 MG capsule Take 20 mg by mouth daily as needed (reflex).    Allergies  Allergen Reactions  . Levofloxacin     Tongue and mouth swelling  . Calcium Channel Blockers     Other reaction(s): Dizziness  . Amoxicillin Other (See Comments)    Has patient had a PCN reaction causing immediate rash, facial/tongue/throat swelling, SOB or lightheadedness with hypotension: No Has patient had a PCN reaction causing severe rash involving mucus membranes or skin necrosis: No Has patient had a PCN reaction that required hospitalization: No Has patient had a PCN reaction occurring within the last 10 years: No If all of the above answers are "NO", then may proceed with Cephalosporin  use.   . Nickel Rash  . Penicillins Other (See Comments)    Has patient had a PCN reaction causing immediate rash, facial/tongue/throat swelling, SOB or lightheadedness with hypotension: No Has patient had a PCN reaction causing severe rash involving mucus membranes or skin necrosis: No Has patient had a PCN reaction that required hospitalization: No Has patient had a PCN reaction occurring within the last 10 years: No If all of the above answers are "NO", then may proceed with Cephalosporin use.   Vitals:   02/08/18 1703 02/08/18 2336  BP: 135/86 (!) 139/92  Pulse: 91 (!) 106  Resp: 18 18  Temp: 98.3 F (36.8 C)   SpO2: 98%  100%    I/O last 3 completed shifts: In: 3402.6 [I.V.:2752.6; NG/GT:400; IV Piggyback:250] Out: 2150 [Emesis/NG output:2150] No intake/output data recorded.  Gen: A&O x 3. NAD. RRR Normal WOB on RA Abdomen soft, NT/ND. NGT in place; suction canister with about 300 cc. Ext: warm  Impression and plan: This is a 72 year old woman who was admitted with a partial small bowel obstruction.  Unfortunately her initial nasogastric tube placement was insufficient and did not provide any actual decompression.  Replacement of her nasogastric tube on hospital day 2 resulted in significant output.  She has had several bowel movements however she continues to have moderate output from her NG tube.  We will continue to encourage ambulation and perform 4 hour clamping trial, in hopes of removing NGT today, possible d/c tomorrow. [name]

## 2018-02-10 MED ORDER — ALPRAZOLAM 0.5 MG PO TABS
0.5000 mg | ORAL_TABLET | Freq: Two times a day (BID) | ORAL | Status: DC
  Administered 2018-02-10: 0.5 mg via ORAL
  Filled 2018-02-10: qty 1

## 2018-02-10 NOTE — Progress Notes (Signed)
[facility] Hospital Day(s): 6.   Post op day(s):  [name]   Interval History: Patient seen and examined, no acute events or new complaints overnight. Patient reports passing a lot of gas, denies nausea or vomiting.  Vital signs in last 24 hours: [min-max] current  Temp:  [98.6 F (37 C)-98.9 F (37.2 C)] 98.6 F (37 C) (12/23 2323) Pulse Rate:  [87-106] 91 (12/23 2323) Resp:  [16-18] 16 (12/23 2323) BP: (129-134)/(82-84) 134/82 (12/23 2323) SpO2:  [98 %-100 %] 99 % (12/23 2323)     Height: 5\' 4"  (162.6 cm) Weight: 72.6 kg      Physical Exam:  Constitutional: alert, cooperative and no distress  Respiratory: breathing non-labored at rest  Cardiovascular: regular rate and sinus rhythm  Gastrointestinal: soft, non-tender, and non-distended  Labs:  CBC Latest Ref Rng & Units 02/08/2018 02/05/2018 02/04/2018  WBC 4.0 - 10.5 K/uL 8.6 9.1 6.1  Hemoglobin 12.0 - 15.0 g/dL 15.9(H) 15.8(H) 15.3(H)  Hematocrit 36.0 - 46.0 % 46.1(H) 47.1(H) 45.8  Platelets 150 - 400 K/uL 254 262 228   CMP Latest Ref Rng & Units 02/08/2018 02/06/2018 02/05/2018  Glucose 70 - 99 mg/dL 121(H) 140(H) 136(H)  BUN 8 - 23 mg/dL 15 13 15   Creatinine 0.44 - 1.00 mg/dL 0.80 0.82 1.02(H)  Sodium 135 - 145 mmol/L 131(L) 133(L) 134(L)  Potassium 3.5 - 5.1 mmol/L 2.8(L) 4.0 5.0  Chloride 98 - 111 mmol/L 98 102 106  CO2 22 - 32 mmol/L 24 22 21(L)  Calcium 8.9 - 10.3 mg/dL 8.4(L) 8.6(L) 8.7(L)  Total Protein 6.5 - 8.1 g/dL - - -  Total Bilirubin 0.3 - 1.2 mg/dL - - -  Alkaline Phos 38 - 126 U/L - - -  AST 15 - 41 U/L - - -  ALT 0 - 44 U/L - - -    Imaging studies: No new pertinent imaging studies   Assessment/Plan:  72 y.o. female with small bowel obstruction. Patient refers continue passing a lot of gas. Tolerated clear liquid. There is no abdominal pain. Will advance diet to full liquid and progress to soft and if tolerates may be discharged in the afternoon.   Arnold Long, MD

## 2018-02-10 NOTE — Discharge Instructions (Signed)
Bowel Obstruction A bowel obstruction is a blockage in the small or large bowel. The bowel, which is also called the intestine, is a long, slender tube that connects the stomach to the anus. When a person eats and drinks, food and fluids go from the mouth to the stomach to the small bowel. This is where most of the nutrients in the food and fluids are absorbed. After the small bowel, material passes through the large bowel for further absorption until any leftover material leaves the body as stool through the anus during a bowel movement. A bowel obstruction will prevent food and fluids from passing through the bowel as they normally do during digestion. The bowel can become partially or completely blocked. If this condition is not treated, it can be dangerous because the bowel could rupture. What are the causes? Common causes of this condition include:  Scar tissue (adhesions) from previous surgery or treatment with high-energy X-rays (radiation).  Recent surgery. This may cause the movements of the bowel to slow down and cause food to block the intestine.  Inflammatory bowel disease, such asCrohn's disease or diverticulitis.  Growths or tumors.  A bulging organ (hernia).  Twisting of the bowel (volvulus).  A foreign body.  Slipping of a part of the bowel into another part (intussusception). What are the signs or symptoms? Symptoms of this condition include:  Pain in the abdomen. Depending on the degree of obstruction, pain may be: ? Mild or severe. ? Dull cramping or sharp pain. ? In one area or in the entire abdomen.  Nausea and vomiting. Vomit may be greenish or a yellow bile color.  Bloating in the abdomen.  Difficulty passing stool (constipation).  Lack of passing gas.  Frequent belching.  Diarrhea. This may occur if the obstruction is partial and runny stool is able to leak around the obstruction. How is this diagnosed? This condition may be diagnosed based  on:  A physical exam.  Medical history.  Imaging tests of the abdomen or pelvis, such as X-ray or CT scan.  Blood or urine tests. How is this treated? Treatment for this condition depends on the cause and severity of the problem. Treatment may include:  Fluids and pain medicines that are given through an IV. Your health care provider may instruct you not to eat or drink if you have nausea or vomiting.  Eating a simple diet. You may be asked to consume a clear liquid diet for several days. This allows the bowel to rest.  Placement of a small tube (nasogastric tube) into the stomach. This will relieve pain, discomfort, and nausea by removing blocked air and fluids from the stomach. It can also help the obstruction clear up faster.  Surgery. This may be required if other treatments do not work. Surgery may be required for: ? Bowel obstruction from a hernia. This can be an emergency procedure. ? Scar tissue that causes frequent or severe obstructions. Follow these instructions at home: Medicines  Take over-the-counter and prescription medicines only as told by your health care provider.  If you were prescribed an antibiotic medicine, take it as told by your health care provider. Do not stop taking the antibiotic even if you start to feel better. General instructions  Follow instructions from your health care provider about eating restrictions. You may need to avoid solid foods and consume only clear liquids until your condition improves.  Return to your normal activities as told by your health care provider. Ask your health care  provider what activities are safe for you.  Avoid sitting for a long time without moving. Get up to take short walks every 1-2 hours. This is important to improve blood flow and breathing. Ask for help if you feel weak or unsteady.  Keep all follow-up visits as told by your health care provider. This is important. How is this prevented? After having a bowel  obstruction, you are more likely to have another. You may do the following things to prevent another obstruction:  If you have a long-term (chronic) disease, pay attention to your symptoms and contact your health care provider if you have questions or concerns.  Avoid becoming constipated. To prevent or treat constipation, your health care provider may recommend that you: ? Drink enough fluid to keep your urine pale yellow. ? Take over-the-counter or prescription medicines. ? Eat foods that are high in fiber, such as beans, whole grains, and fresh fruits and vegetables. ? Limit foods that are high in fat and processed sugars, such as fried or sweet foods.  Stay active. Exercise for 30 minutes or more, 5 or more days each week. Ask your health care provider which exercises are safe for you.  Avoid stress. Find ways to reduce stress, such as meditation, exercise, or taking time for activities that relax you.  Instead of eating three large meals each day, eat three small meals with three small snacks.  Work with a Microbiologist to make a healthy meal plan that works for you.  Do not use any products that contain nicotine or tobacco, such as cigarettes and e-cigarettes. If you need help quitting, ask your health care provider. Contact a health care provider if you:  Have a fever.  Have chills. Get help right away if you:  Have increased pain or cramping.  Vomit blood.  Have uncontrolled vomiting or nausea.  Cannot drink fluids because of vomiting or pain.  Become confused.  Begin feeling very thirsty (dehydrated).  Have severe bloating.  Feel extremely weak or you faint. Summary  A bowel obstruction is a blockage in the small or large bowel.  A bowel obstruction will prevent food and fluids from passing through the bowel as they normally do during digestion.  Treatment for this condition depends on the cause and severity of the problem. It may include fluids and pain medicines  through an IV, a simple diet, a nasogastric tube, or surgery.  Follow instructions from your health care provider about eating restrictions. You may need to avoid solid foods and consume only clear liquids until your condition improves. This information is not intended to replace advice given to you by your health care provider. Make sure you discuss any questions you have with your health care provider. Document Released: 04/23/2005 Document Revised: 06/18/2017 Document Reviewed: 06/18/2017 [employer] Interactive Patient Education  2019 [employer]   Bowel Obstruction A bowel obstruction is a blockage in the small or large bowel. The bowel, which is also called the intestine, is a long, slender tube that connects the stomach to the anus. When a person eats and drinks, food and fluids go from the mouth to the stomach to the small bowel. This is where most of the nutrients in the food and fluids are absorbed. After the small bowel, material passes through the large bowel for further absorption until any leftover material leaves the body as stool through the anus during a bowel movement. A bowel obstruction will prevent food and fluids from passing through the bowel as they  normally do during digestion. The bowel can become partially or completely blocked. If this condition is not treated, it can be dangerous because the bowel could rupture. What are the causes? Common causes of this condition include:  Scar tissue (adhesions) from previous surgery or treatment with high-energy X-rays (radiation).  Recent surgery. This may cause the movements of the bowel to slow down and cause food to block the intestine.  Inflammatory bowel disease, such asCrohn's disease or diverticulitis.  Growths or tumors.  A bulging organ (hernia).  Twisting of the bowel (volvulus).  A foreign body.  Slipping of a part of the bowel into another part (intussusception). What are the signs or symptoms? Symptoms of  this condition include:  Pain in the abdomen. Depending on the degree of obstruction, pain may be: ? Mild or severe. ? Dull cramping or sharp pain. ? In one area or in the entire abdomen.  Nausea and vomiting. Vomit may be greenish or a yellow bile color.  Bloating in the abdomen.  Difficulty passing stool (constipation).  Lack of passing gas.  Frequent belching.  Diarrhea. This may occur if the obstruction is partial and runny stool is able to leak around the obstruction. How is this diagnosed? This condition may be diagnosed based on:  A physical exam.  Medical history.  Imaging tests of the abdomen or pelvis, such as X-ray or CT scan.  Blood or urine tests. How is this treated? Treatment for this condition depends on the cause and severity of the problem. Treatment may include:  Fluids and pain medicines that are given through an IV. Your health care provider may instruct you not to eat or drink if you have nausea or vomiting.  Eating a simple diet. You may be asked to consume a clear liquid diet for several days. This allows the bowel to rest.  Placement of a small tube (nasogastric tube) into the stomach. This will relieve pain, discomfort, and nausea by removing blocked air and fluids from the stomach. It can also help the obstruction clear up faster.  Surgery. This may be required if other treatments do not work. Surgery may be required for: ? Bowel obstruction from a hernia. This can be an emergency procedure. ? Scar tissue that causes frequent or severe obstructions. Follow these instructions at home: Medicines  Take over-the-counter and prescription medicines only as told by your health care provider.  If you were prescribed an antibiotic medicine, take it as told by your health care provider. Do not stop taking the antibiotic even if you start to feel better. General instructions  Follow instructions from your health care provider about eating restrictions.  You may need to avoid solid foods and consume only clear liquids until your condition improves.  Return to your normal activities as told by your health care provider. Ask your health care provider what activities are safe for you.  Avoid sitting for a long time without moving. Get up to take short walks every 1-2 hours. This is important to improve blood flow and breathing. Ask for help if you feel weak or unsteady.  Keep all follow-up visits as told by your health care provider. This is important. How is this prevented? After having a bowel obstruction, you are more likely to have another. You may do the following things to prevent another obstruction:  If you have a long-term (chronic) disease, pay attention to your symptoms and contact your health care provider if you have questions or concerns.  Avoid becoming  constipated. To prevent or treat constipation, your health care provider may recommend that you: ? Drink enough fluid to keep your urine pale yellow. ? Take over-the-counter or prescription medicines. ? Eat foods that are high in fiber, such as beans, whole grains, and fresh fruits and vegetables. ? Limit foods that are high in fat and processed sugars, such as fried or sweet foods.  Stay active. Exercise for 30 minutes or more, 5 or more days each week. Ask your health care provider which exercises are safe for you.  Avoid stress. Find ways to reduce stress, such as meditation, exercise, or taking time for activities that relax you.  Instead of eating three large meals each day, eat three small meals with three small snacks.  Work with a Microbiologist to make a healthy meal plan that works for you.  Do not use any products that contain nicotine or tobacco, such as cigarettes and e-cigarettes. If you need help quitting, ask your health care provider. Contact a health care provider if you:  Have a fever.  Have chills. Get help right away if you:  Have increased pain or  cramping.  Vomit blood.  Have uncontrolled vomiting or nausea.  Cannot drink fluids because of vomiting or pain.  Become confused.  Begin feeling very thirsty (dehydrated).  Have severe bloating.  Feel extremely weak or you faint. Summary  A bowel obstruction is a blockage in the small or large bowel.  A bowel obstruction will prevent food and fluids from passing through the bowel as they normally do during digestion.  Treatment for this condition depends on the cause and severity of the problem. It may include fluids and pain medicines through an IV, a simple diet, a nasogastric tube, or surgery.  Follow instructions from your health care provider about eating restrictions. You may need to avoid solid foods and consume only clear liquids until your condition improves. This information is not intended to replace advice given to you by your health care provider. Make sure you discuss any questions you have with your health care provider. Document Released: 04/23/2005 Document Revised: 06/18/2017 Document Reviewed: 06/18/2017 [employer] Interactive Patient Education  2019 [employer]   Bowel Obstruction A bowel obstruction is a blockage in the small or large bowel. The bowel, which is also called the intestine, is a long, slender tube that connects the stomach to the anus. When a person eats and drinks, food and fluids go from the mouth to the stomach to the small bowel. This is where most of the nutrients in the food and fluids are absorbed. After the small bowel, material passes through the large bowel for further absorption until any leftover material leaves the body as stool through the anus during a bowel movement. A bowel obstruction will prevent food and fluids from passing through the bowel as they normally do during digestion. The bowel can become partially or completely blocked. If this condition is not treated, it can be dangerous because the bowel could rupture. What are  the causes? Common causes of this condition include:  Scar tissue (adhesions) from previous surgery or treatment with high-energy X-rays (radiation).  Recent surgery. This may cause the movements of the bowel to slow down and cause food to block the intestine.  Inflammatory bowel disease, such asCrohn's disease or diverticulitis.  Growths or tumors.  A bulging organ (hernia).  Twisting of the bowel (volvulus).  A foreign body.  Slipping of a part of the bowel into another part (intussusception). What  are the signs or symptoms? Symptoms of this condition include:  Pain in the abdomen. Depending on the degree of obstruction, pain may be: ? Mild or severe. ? Dull cramping or sharp pain. ? In one area or in the entire abdomen.  Nausea and vomiting. Vomit may be greenish or a yellow bile color.  Bloating in the abdomen.  Difficulty passing stool (constipation).  Lack of passing gas.  Frequent belching.  Diarrhea. This may occur if the obstruction is partial and runny stool is able to leak around the obstruction. How is this diagnosed? This condition may be diagnosed based on:  A physical exam.  Medical history.  Imaging tests of the abdomen or pelvis, such as X-ray or CT scan.  Blood or urine tests. How is this treated? Treatment for this condition depends on the cause and severity of the problem. Treatment may include:  Fluids and pain medicines that are given through an IV. Your health care provider may instruct you not to eat or drink if you have nausea or vomiting.  Eating a simple diet. You may be asked to consume a clear liquid diet for several days. This allows the bowel to rest.  Placement of a small tube (nasogastric tube) into the stomach. This will relieve pain, discomfort, and nausea by removing blocked air and fluids from the stomach. It can also help the obstruction clear up faster.  Surgery. This may be required if other treatments do not work.  Surgery may be required for: ? Bowel obstruction from a hernia. This can be an emergency procedure. ? Scar tissue that causes frequent or severe obstructions. Follow these instructions at home: Medicines  Take over-the-counter and prescription medicines only as told by your health care provider.  If you were prescribed an antibiotic medicine, take it as told by your health care provider. Do not stop taking the antibiotic even if you start to feel better. General instructions  Follow instructions from your health care provider about eating restrictions. You may need to avoid solid foods and consume only clear liquids until your condition improves.  Return to your normal activities as told by your health care provider. Ask your health care provider what activities are safe for you.  Avoid sitting for a long time without moving. Get up to take short walks every 1-2 hours. This is important to improve blood flow and breathing. Ask for help if you feel weak or unsteady.  Keep all follow-up visits as told by your health care provider. This is important. How is this prevented? After having a bowel obstruction, you are more likely to have another. You may do the following things to prevent another obstruction:  If you have a long-term (chronic) disease, pay attention to your symptoms and contact your health care provider if you have questions or concerns.  Avoid becoming constipated. To prevent or treat constipation, your health care provider may recommend that you: ? Drink enough fluid to keep your urine pale yellow. ? Take over-the-counter or prescription medicines. ? Eat foods that are high in fiber, such as beans, whole grains, and fresh fruits and vegetables. ? Limit foods that are high in fat and processed sugars, such as fried or sweet foods.  Stay active. Exercise for 30 minutes or more, 5 or more days each week. Ask your health care provider which exercises are safe for you.  Avoid  stress. Find ways to reduce stress, such as meditation, exercise, or taking time for activities that relax you.  Instead of eating three large meals each day, eat three small meals with three small snacks.  Work with a Microbiologist to make a healthy meal plan that works for you.  Do not use any products that contain nicotine or tobacco, such as cigarettes and e-cigarettes. If you need help quitting, ask your health care provider. Contact a health care provider if you:  Have a fever.  Have chills. Get help right away if you:  Have increased pain or cramping.  Vomit blood.  Have uncontrolled vomiting or nausea.  Cannot drink fluids because of vomiting or pain.  Become confused.  Begin feeling very thirsty (dehydrated).  Have severe bloating.  Feel extremely weak or you faint. Summary  A bowel obstruction is a blockage in the small or large bowel.  A bowel obstruction will prevent food and fluids from passing through the bowel as they normally do during digestion.  Treatment for this condition depends on the cause and severity of the problem. It may include fluids and pain medicines through an IV, a simple diet, a nasogastric tube, or surgery.  Follow instructions from your health care provider about eating restrictions. You may need to avoid solid foods and consume only clear liquids until your condition improves. This information is not intended to replace advice given to you by your health care provider. Make sure you discuss any questions you have with your health care provider. Document Released: 04/23/2005 Document Revised: 06/18/2017 Document Reviewed: 06/18/2017 [employer] Interactive Patient Education  2019 [employer]   Bowel Obstruction A bowel obstruction is a blockage in the small or large bowel. The bowel, which is also called the intestine, is a long, slender tube that connects the stomach to the anus. When a person eats and drinks, food and fluids go from the  mouth to the stomach to the small bowel. This is where most of the nutrients in the food and fluids are absorbed. After the small bowel, material passes through the large bowel for further absorption until any leftover material leaves the body as stool through the anus during a bowel movement. A bowel obstruction will prevent food and fluids from passing through the bowel as they normally do during digestion. The bowel can become partially or completely blocked. If this condition is not treated, it can be dangerous because the bowel could rupture. What are the causes? Common causes of this condition include:  Scar tissue (adhesions) from previous surgery or treatment with high-energy X-rays (radiation).  Recent surgery. This may cause the movements of the bowel to slow down and cause food to block the intestine.  Inflammatory bowel disease, such asCrohn's disease or diverticulitis.  Growths or tumors.  A bulging organ (hernia).  Twisting of the bowel (volvulus).  A foreign body.  Slipping of a part of the bowel into another part (intussusception). What are the signs or symptoms? Symptoms of this condition include:  Pain in the abdomen. Depending on the degree of obstruction, pain may be: ? Mild or severe. ? Dull cramping or sharp pain. ? In one area or in the entire abdomen.  Nausea and vomiting. Vomit may be greenish or a yellow bile color.  Bloating in the abdomen.  Difficulty passing stool (constipation).  Lack of passing gas.  Frequent belching.  Diarrhea. This may occur if the obstruction is partial and runny stool is able to leak around the obstruction. How is this diagnosed? This condition may be diagnosed based on:  A physical exam.  Medical history.  Imaging tests of the abdomen or pelvis, such as X-ray or CT scan.  Blood or urine tests. How is this treated? Treatment for this condition depends on the cause and severity of the problem. Treatment may  include:  Fluids and pain medicines that are given through an IV. Your health care provider may instruct you not to eat or drink if you have nausea or vomiting.  Eating a simple diet. You may be asked to consume a clear liquid diet for several days. This allows the bowel to rest.  Placement of a small tube (nasogastric tube) into the stomach. This will relieve pain, discomfort, and nausea by removing blocked air and fluids from the stomach. It can also help the obstruction clear up faster.  Surgery. This may be required if other treatments do not work. Surgery may be required for: ? Bowel obstruction from a hernia. This can be an emergency procedure. ? Scar tissue that causes frequent or severe obstructions. Follow these instructions at home: Medicines  Take over-the-counter and prescription medicines only as told by your health care provider.  If you were prescribed an antibiotic medicine, take it as told by your health care provider. Do not stop taking the antibiotic even if you start to feel better. General instructions  Follow instructions from your health care provider about eating restrictions. You may need to avoid solid foods and consume only clear liquids until your condition improves.  Return to your normal activities as told by your health care provider. Ask your health care provider what activities are safe for you.  Avoid sitting for a long time without moving. Get up to take short walks every 1-2 hours. This is important to improve blood flow and breathing. Ask for help if you feel weak or unsteady.  Keep all follow-up visits as told by your health care provider. This is important. How is this prevented? After having a bowel obstruction, you are more likely to have another. You may do the following things to prevent another obstruction:  If you have a long-term (chronic) disease, pay attention to your symptoms and contact your health care provider if you have questions or  concerns.  Avoid becoming constipated. To prevent or treat constipation, your health care provider may recommend that you: ? Drink enough fluid to keep your urine pale yellow. ? Take over-the-counter or prescription medicines. ? Eat foods that are high in fiber, such as beans, whole grains, and fresh fruits and vegetables. ? Limit foods that are high in fat and processed sugars, such as fried or sweet foods.  Stay active. Exercise for 30 minutes or more, 5 or more days each week. Ask your health care provider which exercises are safe for you.  Avoid stress. Find ways to reduce stress, such as meditation, exercise, or taking time for activities that relax you.  Instead of eating three large meals each day, eat three small meals with three small snacks.  Work with a Microbiologist to make a healthy meal plan that works for you.  Do not use any products that contain nicotine or tobacco, such as cigarettes and e-cigarettes. If you need help quitting, ask your health care provider. Contact a health care provider if you:  Have a fever.  Have chills. Get help right away if you:  Have increased pain or cramping.  Vomit blood.  Have uncontrolled vomiting or nausea.  Cannot drink fluids because of vomiting or pain.  Become confused.  Begin feeling very thirsty (dehydrated).  Have severe  bloating.  Feel extremely weak or you faint. Summary  A bowel obstruction is a blockage in the small or large bowel.  A bowel obstruction will prevent food and fluids from passing through the bowel as they normally do during digestion.  Treatment for this condition depends on the cause and severity of the problem. It may include fluids and pain medicines through an IV, a simple diet, a nasogastric tube, or surgery.  Follow instructions from your health care provider about eating restrictions. You may need to avoid solid foods and consume only clear liquids until your condition improves. This  information is not intended to replace advice given to you by your health care provider. Make sure you discuss any questions you have with your health care provider. Document Released: 04/23/2005 Document Revised: 06/18/2017 Document Reviewed: 06/18/2017 Elsevier Interactive Patient Education  2019 Elsevier Inc.   Bowel Obstruction A bowel obstruction is a blockage in the small or large bowel. The bowel, which is also called the intestine, is a long, slender tube that connects the stomach to the anus. When a person eats and drinks, food and fluids go from the mouth to the stomach to the small bowel. This is where most of the nutrients in the food and fluids are absorbed. After the small bowel, material passes through the large bowel for further absorption until any leftover material leaves the body as stool through the anus during a bowel movement. A bowel obstruction will prevent food and fluids from passing through the bowel as they normally do during digestion. The bowel can become partially or completely blocked. If this condition is not treated, it can be dangerous because the bowel could rupture. What are the causes? Common causes of this condition include:  Scar tissue (adhesions) from previous surgery or treatment with high-energy X-rays (radiation).  Recent surgery. This may cause the movements of the bowel to slow down and cause food to block the intestine.  Inflammatory bowel disease, such asCrohn's disease or diverticulitis.  Growths or tumors.  A bulging organ (hernia).  Twisting of the bowel (volvulus).  A foreign body.  Slipping of a part of the bowel into another part (intussusception). What are the signs or symptoms? Symptoms of this condition include:  Pain in the abdomen. Depending on the degree of obstruction, pain may be: ? Mild or severe. ? Dull cramping or sharp pain. ? In one area or in the entire abdomen.  Nausea and vomiting. Vomit may be greenish or a  yellow bile color.  Bloating in the abdomen.  Difficulty passing stool (constipation).  Lack of passing gas.  Frequent belching.  Diarrhea. This may occur if the obstruction is partial and runny stool is able to leak around the obstruction. How is this diagnosed? This condition may be diagnosed based on:  A physical exam.  Medical history.  Imaging tests of the abdomen or pelvis, such as X-ray or CT scan.  Blood or urine tests. How is this treated? Treatment for this condition depends on the cause and severity of the problem. Treatment may include:  Fluids and pain medicines that are given through an IV. Your health care provider may instruct you not to eat or drink if you have nausea or vomiting.  Eating a simple diet. You may be asked to consume a clear liquid diet for several days. This allows the bowel to rest.  Placement of a small tube (nasogastric tube) into the stomach. This will relieve pain, discomfort, and nausea by removing blocked  air and fluids from the stomach. It can also help the obstruction clear up faster.  Surgery. This may be required if other treatments do not work. Surgery may be required for: ? Bowel obstruction from a hernia. This can be an emergency procedure. ? Scar tissue that causes frequent or severe obstructions. Follow these instructions at home: Medicines  Take over-the-counter and prescription medicines only as told by your health care provider.  If you were prescribed an antibiotic medicine, take it as told by your health care provider. Do not stop taking the antibiotic even if you start to feel better. General instructions  Follow instructions from your health care provider about eating restrictions. You may need to avoid solid foods and consume only clear liquids until your condition improves.  Return to your normal activities as told by your health care provider. Ask your health care provider what activities are safe for you.  Avoid  sitting for a long time without moving. Get up to take short walks every 1-2 hours. This is important to improve blood flow and breathing. Ask for help if you feel weak or unsteady.  Keep all follow-up visits as told by your health care provider. This is important. How is this prevented? After having a bowel obstruction, you are more likely to have another. You may do the following things to prevent another obstruction:  If you have a long-term (chronic) disease, pay attention to your symptoms and contact your health care provider if you have questions or concerns.  Avoid becoming constipated. To prevent or treat constipation, your health care provider may recommend that you: ? Drink enough fluid to keep your urine pale yellow. ? Take over-the-counter or prescription medicines. ? Eat foods that are high in fiber, such as beans, whole grains, and fresh fruits and vegetables. ? Limit foods that are high in fat and processed sugars, such as fried or sweet foods.  Stay active. Exercise for 30 minutes or more, 5 or more days each week. Ask your health care provider which exercises are safe for you.  Avoid stress. Find ways to reduce stress, such as meditation, exercise, or taking time for activities that relax you.  Instead of eating three large meals each day, eat three small meals with three small snacks.  Work with a Microbiologist to make a healthy meal plan that works for you.  Do not use any products that contain nicotine or tobacco, such as cigarettes and e-cigarettes. If you need help quitting, ask your health care provider. Contact a health care provider if you:  Have a fever.  Have chills. Get help right away if you:  Have increased pain or cramping.  Vomit blood.  Have uncontrolled vomiting or nausea.  Cannot drink fluids because of vomiting or pain.  Become confused.  Begin feeling very thirsty (dehydrated).  Have severe bloating.  Feel extremely weak or you  faint. Summary  A bowel obstruction is a blockage in the small or large bowel.  A bowel obstruction will prevent food and fluids from passing through the bowel as they normally do during digestion.  Treatment for this condition depends on the cause and severity of the problem. It may include fluids and pain medicines through an IV, a simple diet, a nasogastric tube, or surgery.  Follow instructions from your health care provider about eating restrictions. You may need to avoid solid foods and consume only clear liquids until your condition improves. This information is not intended to replace advice given to you  by your health care provider. Make sure you discuss any questions you have with your health care provider. Document Released: 04/23/2005 Document Revised: 06/18/2017 Document Reviewed: 06/18/2017 Elsevier Interactive Patient Education  2019 [employer].  Diet: Resume soft heart healthy regular diet.   Activity: increase activity as tolerated  Medications: Continue all home medications.   Call office: Call Dr. Glenford Peers office if has any questions, worsening pain, nausea, vomiting, fevers/chills, or other concerns.

## 2018-02-10 NOTE — Discharge Summary (Addendum)
Patient ID: [name] MRN: [id number] DOB/AGE: 70-Jan-1947 70 y.o.  Admit date: 02/04/2018 Discharge date: 02/10/2018   Discharge Diagnoses:  Active Problems:   Partial small bowel obstruction ([place])   Procedures: None  Hospital Course: Patient admitted with small bowel obstruction. NGT was placed and bowel decompression achieved. Patient started to pass gas and had bowel movement. Yesterday tolerated clear liquids and today tolerated full liquid and soft diet. Patient passing gas and having bowel movement today.  Physical Exam  Constitutional: She is oriented to person, place, and time and well-developed, well-nourished, and in no distress.  HENT:  Head: Normocephalic.  Cardiovascular: Normal rate and regular rhythm.  Pulmonary/Chest: Effort normal and breath sounds normal. No respiratory distress.  Abdominal: Soft. Bowel sounds are normal. She exhibits no distension. There is no abdominal tenderness. There is no rebound.  Musculoskeletal: Normal range of motion.  Neurological: She is alert and oriented to person, place, and time.   Consults: none  Disposition: Discharge disposition: 01-Home or Self Care      Time spent on evaluation and counseling of this patient was more than 30 minutes.  Discharge Instructions    Increase activity slowly   Complete by:  As directed      Allergies as of 02/10/2018      Reactions   Levofloxacin    Tongue and mouth swelling   Calcium Channel Blockers    Other reaction(s): Dizziness   Amoxicillin Other (See Comments)   Has patient had a PCN reaction causing immediate rash, facial/tongue/throat swelling, SOB or lightheadedness with hypotension: No Has patient had a PCN reaction causing severe rash involving mucus membranes or skin necrosis: No Has patient had a PCN reaction that required hospitalization: No Has patient had a PCN reaction occurring within the last 10 years: No If all of the above answers are "NO", then may  proceed with Cephalosporin use.   Nickel Rash   Penicillins Other (See Comments)   Has patient had a PCN reaction causing immediate rash, facial/tongue/throat swelling, SOB or lightheadedness with hypotension: No Has patient had a PCN reaction causing severe rash involving mucus membranes or skin necrosis: No Has patient had a PCN reaction that required hospitalization: No Has patient had a PCN reaction occurring within the last 10 years: No If all of the above answers are "NO", then may proceed with Cephalosporin use.      Medication List    TAKE these medications   ALPRAZolam 0.5 MG tablet Commonly known as:  XANAX TAKE 1 TABLET BY MOUTH THREE TIMES A DAY AS NEEDED FOR ANXIETY What changed:  See the new instructions.   aspirin EC 81 MG tablet Take 81 mg by mouth daily.   atorvastatin 80 MG tablet Commonly known as:  LIPITOR Take 1 tablet (80 mg total) by mouth daily.   citalopram 20 MG tablet Commonly known as:  CELEXA Take 1 tablet (20 mg total) daily by mouth. What changed:  how much to take   HYDROcodone-homatropine 5-1.5 MG/5ML syrup Commonly known as:  HYCODAN Take 5 mLs by mouth every 8 (eight) hours as needed.   ibuprofen 800 MG tablet Commonly known as:  ADVIL,MOTRIN Take 1 tablet (800 mg total) by mouth every 8 (eight) hours as needed.   nystatin cream Commonly known as:  MYCOSTATIN Apply 1 application topically 2 (two) times daily. Mix with equal amounts of hydrocortisone cream   omeprazole 20 MG capsule Commonly known as:  PRILOSEC Take 20 mg by mouth daily as needed (  reflex).      Follow-up Information    Fredirick Maudlin, MD Follow up in 1 week(s).   Specialty:  General Surgery Contact information: [address] [place] [address] [phone]

## 2018-02-10 NOTE — Progress Notes (Signed)
Physical Therapy Treatment Patient Details Name: [name] MRN: [id number] DOB: 1945-06-19 Today's Date: 02/10/2018    History of Present Illness Pt is 72 yo female admitted for small bowel obstruction. PMH of laparotomy and colectomy in 2014, cholecystectomy, hysterectomy, back pain, GERD, HLD, HTN, stroke    PT Comments    Pt agreeable to PT; no voiced complaints. Pt demonstrating independent bed mobility and transfers. Progressed ambulation distance and quality with no use of railings while ambulating. Pt without LOB during ambulation in/out of room or standing functional activity in room. Continue PT to ensure safe, optimal discharge home.    Follow Up Recommendations  Outpatient PT     Equipment Recommendations  None recommended by PT;Other (comment)    Recommendations for Other Services       Precautions / Restrictions Precautions Precautions: Fall Restrictions Weight Bearing Restrictions: No    Mobility  Bed Mobility Overal bed mobility: Independent                Transfers Overall transfer level: Independent                  Ambulation/Gait Ambulation/Gait assistance: Supervision Gait Distance (Feet): 340 Feet Assistive device: IV Pole Gait Pattern/deviations: Step-through pattern     General Gait Details: steady, uses IV pole. No use of railings today and improved fluidity/confidence today.    Stairs             Wheelchair Mobility    Modified Rankin (Stroke Patients Only)       Balance Overall balance assessment: No apparent balance deficits (not formally assessed)                                          Cognition Arousal/Alertness: Awake/alert Behavior During Therapy: WFL for tasks assessed/performed Overall Cognitive Status: Within Functional Limits for tasks assessed                                        Exercises Other Exercises Other Exercises: standing functional activity  in room    General Comments        Pertinent Vitals/Pain Pain Assessment: No/denies pain    Home Living                      Prior Function            PT Goals (current goals can now be found in the care plan section) Progress towards PT goals: Progressing toward goals    Frequency    Min 2X/week      PT Plan Current plan remains appropriate    Co-evaluation              AM-PAC PT "6 Clicks" Mobility   Outcome Measure  Help needed turning from your back to your side while in a flat bed without using bedrails?: None Help needed moving from lying on your back to sitting on the side of a flat bed without using bedrails?: None Help needed moving to and from a bed to a chair (including a wheelchair)?: None Help needed standing up from a chair using your arms (e.g., wheelchair or bedside chair)?: None Help needed to walk in hospital room?: None Help needed climbing 3-5 steps with a railing? : A Little  6 Click Score: 23    End of Session   Activity Tolerance: Patient tolerated treatment well Patient left: in bed;with call bell/phone within reach   PT Visit Diagnosis: Difficulty in walking, not elsewhere classified (R26.2);Other abnormalities of gait and mobility (R26.89)     Time: 4037-5436 PT Time Calculation (min) (ACUTE ONLY): 24 min  Charges:  $Gait Training: 8-22 mins $Therapeutic Activity: 8-22 mins                      Larae Grooms, PTA 02/10/2018, 12:19 PM

## 2018-02-19 ENCOUNTER — Telehealth: Payer: Self-pay

## 2018-02-19 DIAGNOSIS — M545 Low back pain, unspecified: Secondary | ICD-10-CM

## 2018-02-19 MED ORDER — HYDROCODONE-ACETAMINOPHEN 5-325 MG PO TABS: 1.0000 | ORAL_TABLET | Freq: Three times a day (TID) | ORAL | 0 refills | Status: DC | PRN

## 2018-02-19 NOTE — Telephone Encounter (Signed)
Patient is requesting a refill on Hydrocodone. I do not see the medication in patient's med list. She states she has been taking the medication PRN for a while for back pain. CB# [phone].

## 2018-03-13 ENCOUNTER — Ambulatory Visit (INDEPENDENT_AMBULATORY_CARE_PROVIDER_SITE_OTHER): Payer: Medicare Other | Admitting: Physician Assistant

## 2018-03-13 ENCOUNTER — Encounter: Payer: Self-pay | Admitting: Physician Assistant

## 2018-03-13 VITALS — BP 135/86 | HR 74 | Temp 99.3°F | Wt 158.2 lb

## 2018-03-13 DIAGNOSIS — G8929 Other chronic pain: Secondary | ICD-10-CM

## 2018-03-13 DIAGNOSIS — M5442 Lumbago with sciatica, left side: Secondary | ICD-10-CM

## 2018-03-13 DIAGNOSIS — J40 Bronchitis, not specified as acute or chronic: Secondary | ICD-10-CM

## 2018-03-13 MED ORDER — IBUPROFEN 800 MG PO TABS: ORAL_TABLET | ORAL | 0 refills | Status: DC

## 2018-03-13 MED ORDER — PROMETHAZINE-DM 6.25-15 MG/5ML PO SYRP: 5.0000 mL | ORAL_SOLUTION | Freq: Every evening | ORAL | 0 refills | Status: DC | PRN

## 2018-03-13 MED ORDER — ALBUTEROL SULFATE HFA 108 (90 BASE) MCG/ACT IN AERS: 2.0000 | INHALATION_SPRAY | Freq: Four times a day (QID) | RESPIRATORY_TRACT | 2 refills | Status: DC | PRN

## 2018-03-13 NOTE — Patient Instructions (Signed)
Acute Bronchitis, Adult Acute bronchitis is when air tubes (bronchi) in the lungs suddenly get swollen. The condition can make it hard to breathe. It can also cause these symptoms:  A cough.  Coughing up clear, yellow, or green mucus.  Wheezing.  Chest congestion.  Shortness of breath.  A fever.  Body aches.  Chills.  A sore throat. Follow these instructions at home:  Medicines  Take over-the-counter and prescription medicines only as told by your doctor.  If you were prescribed an antibiotic medicine, take it as told by your doctor. Do not stop taking the antibiotic even if you start to feel better. General instructions  Rest.  Drink enough fluids to keep your pee (urine) pale yellow.  Avoid smoking and secondhand smoke. If you smoke and you need help quitting, ask your doctor. Quitting will help your lungs heal faster.  Use an inhaler, cool mist vaporizer, or humidifier as told by your doctor.  Keep all follow-up visits as told by your doctor. This is important. How is this prevented? To lower your risk of getting this condition again:  Wash your hands often with soap and water. If you cannot use soap and water, use hand sanitizer.  Avoid contact with people who have cold symptoms.  Try not to touch your hands to your mouth, nose, or eyes.  Make sure to get the flu shot every year. Contact a doctor if:  Your symptoms do not get better in 2 weeks. Get help right away if:  You cough up blood.  You have chest pain.  You have very bad shortness of breath.  You become dehydrated.  You faint (pass out) or keep feeling like you are going to pass out.  You keep throwing up (vomiting).  You have a very bad headache.  Your fever or chills gets worse. This information is not intended to replace advice given to you by your health care provider. Make sure you discuss any questions you have with your health care provider. Document Released: 07/24/2007 Document  Revised: 09/18/2016 Document Reviewed: 07/26/2015 Elsevier Interactive Patient Education  2019 Elsevier Inc.  

## 2018-03-13 NOTE — Progress Notes (Signed)
Patient: [name] Female    DOB: 07/10/1945   73 y.o.   MRN: [id number] Visit Date: 03/13/2018  Today's Provider: Trinna Post, PA-C   Chief Complaint  Patient presents with  . URI   Subjective:   Patient with history of two bowel obstructions in the past year presents today with the below symptoms.   URI   The current episode started in the past 7 days. The problem has been gradually worsening. There has been no fever. Associated symptoms include congestion, coughing and a sore throat. She has tried decongestant for the symptoms. The treatment provided no relief.   Denies wheezing, shortness of breath, myalgias, vomiting.    Allergies  Allergen Reactions  . Levofloxacin     Tongue and mouth swelling  . Calcium Channel Blockers     Other reaction(s): Dizziness  . Amoxicillin Other (See Comments)    Has patient had a PCN reaction causing immediate rash, facial/tongue/throat swelling, SOB or lightheadedness with hypotension: No Has patient had a PCN reaction causing severe rash involving mucus membranes or skin necrosis: No Has patient had a PCN reaction that required hospitalization: No Has patient had a PCN reaction occurring within the last 10 years: No If all of the above answers are "NO", then may proceed with Cephalosporin use.   . Nickel Rash  . Penicillins Other (See Comments)    Has patient had a PCN reaction causing immediate rash, facial/tongue/throat swelling, SOB or lightheadedness with hypotension: No Has patient had a PCN reaction causing severe rash involving mucus membranes or skin necrosis: No Has patient had a PCN reaction that required hospitalization: No Has patient had a PCN reaction occurring within the last 10 years: No If all of the above answers are "NO", then may proceed with Cephalosporin use.     Current Outpatient Medications:  .  ALPRAZolam (XANAX) 0.5 MG tablet, TAKE 1 TABLET BY MOUTH THREE TIMES A DAY AS NEEDED FOR ANXIETY  (Patient taking differently: Take 0.5 mg by mouth 2 (two) times daily. ), Disp: 90 tablet, Rfl: 4 .  aspirin EC 81 MG tablet, Take 81 mg by mouth daily., Disp: , Rfl:  .  citalopram (CELEXA) 20 MG tablet, Take 1 tablet (20 mg total) daily by mouth. (Patient taking differently: Take 10 mg by mouth daily. ), Disp: 90 tablet, Rfl: 4 .  HYDROcodone-acetaminophen (NORCO/VICODIN) 5-325 MG tablet, Take 1 tablet by mouth every 8 (eight) hours as needed for moderate pain., Disp: 15 tablet, Rfl: 0 .  nystatin cream (MYCOSTATIN), Apply 1 application topically 2 (two) times daily. Mix with equal amounts of hydrocortisone cream, Disp: 30 g, Rfl: 0 .  omeprazole (PRILOSEC) 20 MG capsule, Take 20 mg by mouth daily as needed (reflex). , Disp: , Rfl:  .  albuterol (PROVENTIL HFA;VENTOLIN HFA) 108 (90 Base) MCG/ACT inhaler, Inhale 2 puffs into the lungs every 6 (six) hours as needed for wheezing or shortness of breath., Disp: 1 Inhaler, Rfl: 2 .  atorvastatin (LIPITOR) 80 MG tablet, Take 1 tablet (80 mg total) by mouth daily. (Patient not taking: Reported on 02/04/2018), Disp: 30 tablet, Rfl: 5 .  HYDROcodone-homatropine (HYCODAN) 5-1.5 MG/5ML syrup, Take 5 mLs by mouth every 8 (eight) hours as needed. (Patient not taking: Reported on 02/04/2018), Disp: 100 mL, Rfl: 0 .  ibuprofen (ADVIL,MOTRIN) 800 MG tablet, One tab daily as needed. Try to minimize use to severe pain., Disp: 30 tablet, Rfl: 0 .  promethazine-dextromethorphan (PROMETHAZINE-DM) 6.25-15  MG/5ML syrup, Take 5 mLs by mouth at bedtime as needed for cough., Disp: 118 mL, Rfl: 0  Review of Systems  Constitutional: Positive for chills.  HENT: Positive for congestion, sinus pressure and sore throat.   Respiratory: Positive for cough.   Genitourinary: Negative.   Neurological: Negative.     Social History   Tobacco Use  . Smoking status: Never Smoker  . Smokeless tobacco: Never Used  Substance Use Topics  . Alcohol use: No    Alcohol/week: 0.0  standard drinks      Objective:   BP 135/86 (BP Location: Left Arm, Patient Position: Sitting, Cuff Size: Normal)   Pulse 74   Temp 99.3 F (37.4 C) (Oral)   Wt 158 lb 3.2 oz (71.8 kg)   SpO2 98%   BMI 27.15 kg/m  Vitals:   03/13/18 1110  BP: 135/86  Pulse: 74  Temp: 99.3 F (37.4 C)  TempSrc: Oral  SpO2: 98%  Weight: 158 lb 3.2 oz (71.8 kg)     Physical Exam Constitutional:      Appearance: Normal appearance. She is ill-appearing.     Comments: Voice hoarse.   Cardiovascular:     Rate and Rhythm: Normal rate and regular rhythm.     Heart sounds: Normal heart sounds.  Pulmonary:     Effort: Pulmonary effort is normal.     Breath sounds: Wheezing and rhonchi present.  Skin:    General: Skin is warm and dry.  Neurological:     Mental Status: She is alert.  Psychiatric:        Mood and Affect: Mood normal.        Behavior: Behavior normal.         Assessment & Plan    1. Bronchitis  Patient declines steroids because they make her feel funny. She is willing to take an inhaler. She requests narcotic cough medication but I am concerned for risk of constipation and recent history of SBO. Will prescribe cough medication as below.  - promethazine-dextromethorphan (PROMETHAZINE-DM) 6.25-15 MG/5ML syrup; Take 5 mLs by mouth at bedtime as needed for cough.  Dispense: 118 mL; Refill: 0 - albuterol (PROVENTIL HFA;VENTOLIN HFA) 108 (90 Base) MCG/ACT inhaler; Inhale 2 puffs into the lungs every 6 (six) hours as needed for wheezing or shortness of breath.  Dispense: 1 Inhaler; Refill: 2  2. Chronic bilateral low back pain with left-sided sciatica  She is requesting refill on high dose of ibuprofen. She has had her last refill 07/2017.  - ibuprofen (ADVIL,MOTRIN) 800 MG tablet; One tab daily as needed. Try to minimize use to severe pain.  Dispense: 30 tablet; Refill: 0  The entirety of the information documented in the History of Present Illness, Review of Systems and  Physical Exam were personally obtained by me. Portions of this information were initially documented by Wilburt Finlay, CMA and reviewed by me for thoroughness and accuracy.   Return if symptoms worsen or fail to improve.         Trinna Post, PA-C  [facility]

## 2018-03-16 ENCOUNTER — Telehealth: Payer: Self-pay | Admitting: Physician Assistant

## 2018-03-16 DIAGNOSIS — J40 Bronchitis, not specified as acute or chronic: Secondary | ICD-10-CM

## 2018-03-16 MED ORDER — PREDNISONE 20 MG PO TABS: 20.0000 mg | ORAL_TABLET | Freq: Every day | ORAL | 0 refills | Status: AC

## 2018-03-16 NOTE — Telephone Encounter (Signed)
Patient advised as below.  

## 2018-03-16 NOTE — Telephone Encounter (Signed)
Prednisone sent to [facility] on [address] [address].

## 2018-03-16 NOTE — Telephone Encounter (Signed)
Patient is not much better and is asking to get Prednisone called into [facility] [address]. She could not afford the inhaler she was prescribed at office visit.

## 2018-03-19 ENCOUNTER — Other Ambulatory Visit: Payer: Self-pay | Admitting: Physician Assistant

## 2018-03-19 DIAGNOSIS — J40 Bronchitis, not specified as acute or chronic: Secondary | ICD-10-CM

## 2018-03-21 ENCOUNTER — Other Ambulatory Visit: Payer: Self-pay | Admitting: Family Medicine

## 2018-03-21 DIAGNOSIS — F32A Depression, unspecified: Secondary | ICD-10-CM

## 2018-03-21 DIAGNOSIS — F329 Major depressive disorder, single episode, unspecified: Secondary | ICD-10-CM

## 2018-04-05 ENCOUNTER — Other Ambulatory Visit: Payer: Self-pay | Admitting: Physician Assistant

## 2018-04-05 DIAGNOSIS — M5442 Lumbago with sciatica, left side: Principal | ICD-10-CM

## 2018-04-05 DIAGNOSIS — G8929 Other chronic pain: Secondary | ICD-10-CM

## 2018-05-10 ENCOUNTER — Other Ambulatory Visit: Payer: Self-pay | Admitting: Family Medicine

## 2018-06-26 ENCOUNTER — Other Ambulatory Visit: Payer: Self-pay

## 2018-06-26 DIAGNOSIS — M545 Low back pain, unspecified: Secondary | ICD-10-CM

## 2018-06-26 MED ORDER — HYDROCODONE-ACETAMINOPHEN 5-325 MG PO TABS: 1.0000 | ORAL_TABLET | Freq: Three times a day (TID) | ORAL | 0 refills | Status: DC | PRN

## 2018-06-26 NOTE — Telephone Encounter (Signed)
Please review. Thanks!  

## 2018-06-26 NOTE — Telephone Encounter (Signed)
Patient is requesting a refill on HYDROcodone-acetaminophen (NORCO/VICODIN) 5-325 MG tablet be sent to [facility].

## 2018-07-01 ENCOUNTER — Telehealth: Payer: Self-pay | Admitting: Family Medicine

## 2018-07-01 DIAGNOSIS — L304 Erythema intertrigo: Secondary | ICD-10-CM

## 2018-07-01 MED ORDER — NYSTATIN 100000 UNIT/GM EX CREA: 1.0000 "application " | TOPICAL_CREAM | Freq: Two times a day (BID) | CUTANEOUS | 0 refills | Status: DC

## 2018-07-01 NOTE — Telephone Encounter (Signed)
[facility] faxed refill request for the following medications:  nystatin cream (MYCOSTATIN)    Please advise.

## 2018-07-01 NOTE — Telephone Encounter (Signed)
refilled 

## 2018-07-23 ENCOUNTER — Encounter: Payer: Self-pay | Admitting: Family Medicine

## 2018-07-23 ENCOUNTER — Ambulatory Visit (INDEPENDENT_AMBULATORY_CARE_PROVIDER_SITE_OTHER): Payer: Medicare Other | Admitting: Family Medicine

## 2018-07-23 DIAGNOSIS — J0141 Acute recurrent pansinusitis: Secondary | ICD-10-CM | POA: Diagnosis not present

## 2018-07-23 MED ORDER — FLUTICASONE PROPIONATE 50 MCG/ACT NA SUSP: 2.0000 | Freq: Every day | NASAL | 6 refills | Status: DC

## 2018-07-23 NOTE — Progress Notes (Signed)
Patient: [name] Female    DOB: 07/18/1945   73 y.o.   MRN: [id number] Visit Date: 07/23/2018   Today's Provider: Lavon Paganini, MD   Chief Complaint  Patient presents with  . Sinusitis   Subjective:    Virtual Visit via Video Note  I connected with [name] on 07/23/18 at 10:00 AM EDT by a video enabled telemedicine application and verified that I am speaking with the correct person using two identifiers.   Patient location: home Provider location: [place] involved in the visit: patient, provider   I discussed the limitations of evaluation and management by telemedicine and the availability of in person appointments. The patient expressed understanding and agreed to proceed.  Sinus Problem  This is a new problem. Episode onset: 2-3 days ago. The problem has been gradually worsening since onset. Maximum temperature: low grade fever. Associated symptoms include congestion, coughing, headaches, shortness of breath and sinus pressure. Pertinent negatives include no sore throat. Treatments tried: OTC sinus medication. The treatment provided no relief.   States that she has allergic rhinitis and gets a sinus infection like this every year.  She wants an antibiotic and to "nip it in the bud" before it gets bad. She states that she doesn't want to get sick before taking an antibiotic  No fever or sore throat.    Was using a nasal spray, not sure what it was called.  Allergies  Allergen Reactions  . Levofloxacin     Tongue and mouth swelling  . Calcium Channel Blockers     Other reaction(s): Dizziness  . Amoxicillin Other (See Comments)    Has patient had a PCN reaction causing immediate rash, facial/tongue/throat swelling, SOB or lightheadedness with hypotension: No Has patient had a PCN reaction causing severe rash involving mucus membranes or skin necrosis: No Has patient had a PCN reaction that required hospitalization: No Has  patient had a PCN reaction occurring within the last 10 years: No If all of the above answers are "NO", then may proceed with Cephalosporin use.   . Nickel Rash  . Penicillins Other (See Comments)    Has patient had a PCN reaction causing immediate rash, facial/tongue/throat swelling, SOB or lightheadedness with hypotension: No Has patient had a PCN reaction causing severe rash involving mucus membranes or skin necrosis: No Has patient had a PCN reaction that required hospitalization: No Has patient had a PCN reaction occurring within the last 10 years: No If all of the above answers are "NO", then may proceed with Cephalosporin use.     Current Outpatient Medications:  .  ALPRAZolam (XANAX) 0.5 MG tablet, Take 1 tablet (0.5 mg total) by mouth 3 (three) times daily as needed for anxiety., Disp: 90 tablet, Rfl: 4 .  aspirin EC 81 MG tablet, Take 81 mg by mouth daily., Disp: , Rfl:  .  atorvastatin (LIPITOR) 80 MG tablet, Take 1 tablet (80 mg total) by mouth daily., Disp: 30 tablet, Rfl: 5 .  citalopram (CELEXA) 20 MG tablet, TAKE 1 TABLET (20 MG TOTAL) DAILY BY MOUTH., Disp: 90 tablet, Rfl: 4 .  HYDROcodone-acetaminophen (NORCO/VICODIN) 5-325 MG tablet, Take 1 tablet by mouth every 8 (eight) hours as needed for moderate pain., Disp: 15 tablet, Rfl: 0 .  ibuprofen (ADVIL,MOTRIN) 800 MG tablet, ONE TAB DAILY AS NEEDED. TRY TO MINIMIZE USE TO SEVERE PAIN., Disp: 30 tablet, Rfl: 3 .  nystatin cream (MYCOSTATIN), Apply 1 application topically 2 (two)  times daily. Mix with equal amounts of hydrocortisone cream, Disp: 30 g, Rfl: 0 .  omeprazole (PRILOSEC) 20 MG capsule, Take 20 mg by mouth daily as needed (reflex). , Disp: , Rfl:   Review of Systems  Constitutional: Positive for fever.  HENT: Positive for congestion, sinus pressure and sinus pain. Negative for sore throat.   Respiratory: Positive for cough and shortness of breath.   Cardiovascular: Negative.   Musculoskeletal: Negative.    Neurological: Positive for headaches.    Social History   Tobacco Use  . Smoking status: Never Smoker  . Smokeless tobacco: Never Used  Substance Use Topics  . Alcohol use: No    Alcohol/week: 0.0 standard drinks      Objective:   There were no vitals taken for this visit. There were no vitals filed for this visit.   Physical Exam Constitutional:      Appearance: Normal appearance.  HENT:     Nose:     Comments: Reports pain with palpating over sinuses diffusely Pulmonary:     Effort: Pulmonary effort is normal. No respiratory distress.  Neurological:     Mental Status: She is alert and oriented to person, place, and time. Mental status is at baseline.  Psychiatric:        Mood and Affect: Mood normal.        Behavior: Behavior normal.         Assessment & Plan    I discussed the assessment and treatment plan with the patient. The patient was provided an opportunity to ask questions and all were answered. The patient agreed with the plan and demonstrated an understanding of the instructions.   The patient was advised to call back or seek an in-person evaluation if the symptoms worsen or if the condition fails to improve as anticipated.  1. Acute recurrent pansinusitis - symptoms and exam c/w sinusitis vs allergic rhinitis   - no symptoms of AOM, CAP, strep pharyngitis, or other infection - low suspicion for COVID19 infection - given duration of symptoms, discussed with patient that this is most likely allergic or viral, not bacterial - no abx at this time - conservative management with flonase, antihistamine, neti pot - if still symptomatic at 7-10 days duration, can consider abx therapy    Meds ordered this encounter  Medications  . fluticasone (FLONASE) 50 MCG/ACT nasal spray    Sig: Place 2 sprays into both nostrils daily.    Dispense:  16 g    Refill:  6     Return if symptoms worsen or fail to improve.   The entirety of the information documented  in the History of Present Illness, Review of Systems and Physical Exam were personally obtained by me. Portions of this information were initially documented by Tiburcio Pea, CMA and reviewed by me for thoroughness and accuracy.    , Dionne Bucy, MD MPH [facility]

## 2018-07-23 NOTE — Patient Instructions (Signed)
Sinusitis, Adult  Sinusitis is inflammation of your sinuses. Sinuses are hollow spaces in the bones around your face. Your sinuses are located:   Around your eyes.   In the middle of your forehead.   Behind your nose.   In your cheekbones.  Mucus normally drains out of your sinuses. When your nasal tissues become inflamed or swollen, mucus can become trapped or blocked. This allows bacteria, viruses, and fungi to grow, which leads to infection. Most infections of the sinuses are caused by a virus.  Sinusitis can develop quickly. It can last for up to 4 weeks (acute) or for more than 12 weeks (chronic). Sinusitis often develops after a cold.  What are the causes?  This condition is caused by anything that creates swelling in the sinuses or stops mucus from draining. This includes:   Allergies.   Asthma.   Infection from bacteria or viruses.   Deformities or blockages in your nose or sinuses.   Abnormal growths in the nose (nasal polyps).   Pollutants, such as chemicals or irritants in the air.   Infection from fungi (rare).  What increases the risk?  You are more likely to develop this condition if you:   Have a weak body defense system (immune system).   Do a lot of swimming or diving.   Overuse nasal sprays.   Smoke.  What are the signs or symptoms?  The main symptoms of this condition are pain and a feeling of pressure around the affected sinuses. Other symptoms include:   Stuffy nose or congestion.   Thick drainage from your nose.   Swelling and warmth over the affected sinuses.   Headache.   Upper toothache.   A cough that may get worse at night.   Extra mucus that collects in the throat or the back of the nose (postnasal drip).   Decreased sense of smell and taste.   Fatigue.   A fever.   Sore throat.   Bad breath.  How is this diagnosed?  This condition is diagnosed based on:   Your symptoms.   Your medical history.   A physical exam.   Tests to find out if your condition is  acute or chronic. This may include:  ? Checking your nose for nasal polyps.  ? Viewing your sinuses using a device that has a light (endoscope).  ? Testing for allergies or bacteria.  ? Imaging tests, such as an MRI or CT scan.  In rare cases, a bone biopsy may be done to rule out more serious types of fungal sinus disease.  How is this treated?  Treatment for sinusitis depends on the cause and whether your condition is chronic or acute.   If caused by a virus, your symptoms should go away on their own within 10 days. You may be given medicines to relieve symptoms. They include:  ? Medicines that shrink swollen nasal passages (topical intranasal decongestants).  ? Medicines that treat allergies (antihistamines).  ? A spray that eases inflammation of the nostrils (topical intranasal corticosteroids).  ? Rinses that help get rid of thick mucus in your nose (nasal saline washes).   If caused by bacteria, your health care provider may recommend waiting to see if your symptoms improve. Most bacterial infections will get better without antibiotic medicine. You may be given antibiotics if you have:  ? A severe infection.  ? A weak immune system.   If caused by narrow nasal passages or nasal polyps, you may need   to have surgery.  Follow these instructions at home:  Medicines   Take, use, or apply over-the-counter and prescription medicines only as told by your health care provider. These may include nasal sprays.   If you were prescribed an antibiotic medicine, take it as told by your health care provider. Do not stop taking the antibiotic even if you start to feel better.  Hydrate and humidify     Drink enough fluid to keep your urine pale yellow. Staying hydrated will help to thin your mucus.   Use a cool mist humidifier to keep the humidity level in your home above 50%.   Inhale steam for 10-15 minutes, 3-4 times a day, or as told by your health care provider. You can do this in the bathroom while a hot shower is  running.   Limit your exposure to cool or dry air.  Rest   Rest as much as possible.   Sleep with your head raised (elevated).   Make sure you get enough sleep each night.  General instructions     Apply a warm, moist washcloth to your face 3-4 times a day or as told by your health care provider. This will help with discomfort.   Wash your hands often with soap and water to reduce your exposure to germs. If soap and water are not available, use hand sanitizer.   Do not smoke. Avoid being around people who are smoking (secondhand smoke).   Keep all follow-up visits as told by your health care provider. This is important.  Contact a health care provider if:   You have a fever.   Your symptoms get worse.   Your symptoms do not improve within 10 days.  Get help right away if:   You have a severe headache.   You have persistent vomiting.   You have severe pain or swelling around your face or eyes.   You have vision problems.   You develop confusion.   Your neck is stiff.   You have trouble breathing.  Summary   Sinusitis is soreness and inflammation of your sinuses. Sinuses are hollow spaces in the bones around your face.   This condition is caused by nasal tissues that become inflamed or swollen. The swelling traps or blocks the flow of mucus. This allows bacteria, viruses, and fungi to grow, which leads to infection.   If you were prescribed an antibiotic medicine, take it as told by your health care provider. Do not stop taking the antibiotic even if you start to feel better.   Keep all follow-up visits as told by your health care provider. This is important.  This information is not intended to replace advice given to you by your health care provider. Make sure you discuss any questions you have with your health care provider.  Document Released: 02/04/2005 Document Revised: 07/07/2017 Document Reviewed: 07/07/2017  Elsevier Interactive Patient Education  2019 [employer]

## 2018-07-25 ENCOUNTER — Other Ambulatory Visit: Payer: Self-pay | Admitting: Physician Assistant

## 2018-07-25 DIAGNOSIS — M5442 Lumbago with sciatica, left side: Secondary | ICD-10-CM

## 2018-07-25 DIAGNOSIS — G8929 Other chronic pain: Secondary | ICD-10-CM

## 2018-08-10 ENCOUNTER — Telehealth: Payer: Self-pay

## 2018-08-10 NOTE — Telephone Encounter (Signed)
Patient called the office reporting that for the past 2 months she has been under more stress. She says her family tells her that she is hateful, negative and snappy. For the past 2 months she has been hearing voices and knocks on her front door. Occasionally she hallucinates seeing someone sitting in her peripheral vision. Patient currently takes Xanax twice daily and Citalopram at night. I consulted with Dr. [name], and was advised to have patient  hold off on taking the Citalopram, and follow up in the office for an appointment tomorrow morning. Patient advised and verbally voiced understanding. Appointment scheduled for 08/11/2018 at 9:20am.

## 2018-08-11 ENCOUNTER — Other Ambulatory Visit: Payer: Self-pay

## 2018-08-11 ENCOUNTER — Encounter: Payer: Self-pay | Admitting: Family Medicine

## 2018-08-11 ENCOUNTER — Ambulatory Visit (INDEPENDENT_AMBULATORY_CARE_PROVIDER_SITE_OTHER): Payer: Medicare Other | Admitting: Family Medicine

## 2018-08-11 VITALS — BP 128/80 | HR 75 | Temp 98.8°F | Resp 16 | Wt 165.0 lb

## 2018-08-11 DIAGNOSIS — E559 Vitamin D deficiency, unspecified: Secondary | ICD-10-CM

## 2018-08-11 DIAGNOSIS — R441 Visual hallucinations: Secondary | ICD-10-CM

## 2018-08-11 DIAGNOSIS — F329 Major depressive disorder, single episode, unspecified: Secondary | ICD-10-CM

## 2018-08-11 DIAGNOSIS — M549 Dorsalgia, unspecified: Secondary | ICD-10-CM | POA: Diagnosis not present

## 2018-08-11 DIAGNOSIS — F32A Depression, unspecified: Secondary | ICD-10-CM

## 2018-08-11 DIAGNOSIS — R319 Hematuria, unspecified: Secondary | ICD-10-CM

## 2018-08-11 DIAGNOSIS — F430 Acute stress reaction: Secondary | ICD-10-CM

## 2018-08-11 LAB — POCT URINALYSIS DIPSTICK
Glucose, UA: NEGATIVE
Ketones, UA: NEGATIVE
Leukocytes, UA: NEGATIVE
Nitrite, UA: NEGATIVE
Protein, UA: NEGATIVE
Spec Grav, UA: 1.02 (ref 1.010–1.025)
Urobilinogen, UA: 0.2 E.U./dL
pH, UA: 6 (ref 5.0–8.0)

## 2018-08-11 NOTE — Patient Instructions (Signed)
.   Please review the attached list of medications and notify my office if there are any errors.   . Please bring all of your medications to every appointment so we can make sure that our medication list is the same as yours.   
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
## 2018-08-12 ENCOUNTER — Telehealth: Payer: Self-pay

## 2018-08-12 LAB — COMPREHENSIVE METABOLIC PANEL
ALT: 15 IU/L (ref 0–32)
AST: 19 IU/L (ref 0–40)
Albumin/Globulin Ratio: 1.7 (ref 1.2–2.2)
Albumin: 4.3 g/dL (ref 3.8–4.8)
Alkaline Phosphatase: 62 IU/L (ref 39–117)
BUN/Creatinine Ratio: 16 (ref 12–28)
BUN: 13 mg/dL (ref 8–27)
Bilirubin Total: 0.8 mg/dL (ref 0.0–1.2)
CO2: 19 mmol/L — ABNORMAL LOW (ref 20–29)
Calcium: 9.2 mg/dL (ref 8.7–10.3)
Chloride: 103 mmol/L (ref 96–106)
Creatinine, Ser: 0.8 mg/dL (ref 0.57–1.00)
GFR calc Af Amer: 86 mL/min/{1.73_m2} (ref >59)
GFR calc non Af Amer: 75 mL/min/{1.73_m2} (ref >59)
Globulin, Total: 2.6 g/dL (ref 1.5–4.5)
Glucose: 98 mg/dL (ref 65–99)
Potassium: 4.2 mmol/L (ref 3.5–5.2)
Sodium: 136 mmol/L (ref 134–144)
Total Protein: 6.9 g/dL (ref 6.0–8.5)

## 2018-08-12 LAB — CBC
Hematocrit: 45.5 % (ref 34.0–46.6)
Hemoglobin: 15.3 g/dL (ref 11.1–15.9)
MCH: 29.9 pg (ref 26.6–33.0)
MCHC: 33.6 g/dL (ref 31.5–35.7)
MCV: 89 fL (ref 79–97)
Platelets: 254 10*3/uL (ref 150–450)
RBC: 5.11 x10E6/uL (ref 3.77–5.28)
RDW: 13.9 % (ref 11.7–15.4)
WBC: 4.9 10*3/uL (ref 3.4–10.8)

## 2018-08-12 LAB — VITAMIN D 25 HYDROXY (VIT D DEFICIENCY, FRACTURES): Vit D, 25-Hydroxy: 20.8 ng/mL — ABNORMAL LOW (ref 30.0–100.0)

## 2018-08-12 LAB — TSH: TSH: 1.46 u[IU]/mL (ref 0.450–4.500)

## 2018-08-12 MED ORDER — VENLAFAXINE HCL ER 37.5 MG PO CP24: 37.5000 mg | ORAL_CAPSULE | Freq: Every day | ORAL | 0 refills | Status: DC

## 2018-08-12 NOTE — Telephone Encounter (Signed)
Pt advised. RX sent to [facility] in [place].  Follow up apt made for 09/08/2018 at 9:40   Thanks,   -Mickel Baas

## 2018-08-12 NOTE — Telephone Encounter (Signed)
-----   Message from Birdie Sons, MD sent at 08/12/2018  8:09 AM EDT ----- Vitamin d level is very low. Need to start OTC vitamin d3 2000 units once a day. Rest of labs are normal Recommend she change citalopram to venlafaxine XR 37.5 once tablet daily, #30 and follow up in 3 weeks.

## 2018-08-13 LAB — URINE CULTURE: Organism ID, Bacteria: NO GROWTH

## 2018-08-28 ENCOUNTER — Telehealth: Payer: Self-pay | Admitting: Family Medicine

## 2018-08-28 NOTE — Chronic Care Management (AMB) (Signed)
Chronic Care Management   Note  08/28/2018 Name: [name] MRN: [id number] DOB: [birth year]  [name] is a 73 y.o. year old female who is a primary care patient of [name], [name], MD. I reached out to [name] by phone today in response to a referral sent by Ms. [name]'s health plan.    [name] was given information about Chronic Care Management services today including:  1. CCM service includes personalized support from designated clinical staff supervised by her physician, including individualized plan of care and coordination with other care providers 2. 24/7 contact phone numbers for assistance for urgent and routine care needs. 3. Service will only be billed when office clinical staff spend 20 minutes or more in a month to coordinate care. 4. Only one practitioner may furnish and bill the service in a calendar month. 5. The patient may stop CCM services at any time (effective at the end of the month) by phone call to the office staff. 6. The patient will be responsible for cost sharing (co-pay) of up to 20% of the service fee (after annual deductible is met).  Patient agreed to services and verbal consent obtained.   Follow up plan: Telephone appointment with CCM team member scheduled for: 09/02/2018   [place]  ??[email or web address]   ??[phone]

## 2018-09-02 ENCOUNTER — Encounter: Payer: Self-pay | Admitting: *Deleted

## 2018-09-02 ENCOUNTER — Ambulatory Visit: Payer: Medicare Other | Admitting: *Deleted

## 2018-09-02 DIAGNOSIS — F32A Depression, unspecified: Secondary | ICD-10-CM

## 2018-09-02 DIAGNOSIS — F329 Major depressive disorder, single episode, unspecified: Secondary | ICD-10-CM

## 2018-09-02 DIAGNOSIS — F419 Anxiety disorder, unspecified: Secondary | ICD-10-CM

## 2018-09-03 ENCOUNTER — Encounter: Payer: Self-pay | Admitting: *Deleted

## 2018-09-03 ENCOUNTER — Other Ambulatory Visit: Payer: Self-pay | Admitting: Family Medicine
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
## 2018-09-03 NOTE — Patient Instructions (Signed)
Thank you allowing the Chronic Care Management Team to be a part of your care! It was a pleasure speaking with you today!  1. Please call this social worker with any questions related to your mental health needs 2. Please continue to practice self care  CCM (Chronic Care Management) Team   Trish Fountain RN, BSN Nurse Care Coordinator  [phone]  Ruben Reason PharmD  Clinical Pharmacist  [phone]   Elliot Gurney, LCSW Clinical Social Worker [phone]  Goals Addressed            This Visit's Progress   . "I have been depressed lately" (pt-stated)       Current Barriers:  [name] Mental Health Concerns   Clinical Social Work Clinical Goal(s):  [name] Over the next 90 days, client will work with SW to address concerns related to depression  Interventions: . Patient interviewed and appropriate assessments performed . Provided mental health counseling with regard to symptoms of depression and common triggers (mental health diagnosis or concern) . Provided emotional support, explored current coping strategies used, emphasizing self care . Discussed plans with patient for ongoing care management follow up and provided patient with direct contact information for care management team  Patient Self Care Activities:  . Performs ADL's independently . Performs IADL's independently . Calls provider office for new concerns or questions  Initial goal documentation         The patient verbalized understanding of instructions provided today and declined a print copy of patient instruction materials.   The care management team will reach out to the patient again over the next 14  days.

## 2018-09-05 NOTE — Telephone Encounter (Signed)
ADDRESS AT FOLLOW UP APPT ON 09-08-2018.

## 2018-09-07 ENCOUNTER — Encounter: Payer: Self-pay | Admitting: Family Medicine

## 2018-09-07 ENCOUNTER — Other Ambulatory Visit: Payer: Self-pay

## 2018-09-07 DIAGNOSIS — M545 Low back pain, unspecified: Secondary | ICD-10-CM

## 2018-09-07 DIAGNOSIS — L304 Erythema intertrigo: Secondary | ICD-10-CM

## 2018-09-07 NOTE — Telephone Encounter (Signed)
Patient request refill

## 2018-09-07 NOTE — Progress Notes (Signed)
Patient: [name] Female    DOB: October 27, 1945   73 y.o.   MRN: [id number] Visit Date: 09/07/2018  Today's Provider: Lelon Huh, MD   Chief Complaint  Patient presents with  . Follow-up   Subjective:    Virtual Visit via Video Note  I connected with [name] on 09/08/18 at  9:40 AM EDT by a video enabled telemedicine application and verified that I am speaking with the correct person using two identifiers.   I discussed the limitations of evaluation and management by telemedicine and the availability of in person appointments. The patient expressed understanding and agreed to proceed.  HPI    Follow up for Depression:  The patient was last seen for this 1 months ago. Changes made at last visit include weaning off of Citalopram and starting Venlafaxine XR.  She reports good compliance with treatment. She feels that condition is Worse. States her family tells her she is very anxious and gets upset easily. She still feels depressed, she states she is sleeping well.  She is not having side effects.   ------------------------------------------------------------------------------------  Allergies  Allergen Reactions  . Levofloxacin     Tongue and mouth swelling  . Calcium Channel Blockers     Other reaction(s): Dizziness  . Amoxicillin Other (See Comments)    Has patient had a PCN reaction causing immediate rash, facial/tongue/throat swelling, SOB or lightheadedness with hypotension: No Has patient had a PCN reaction causing severe rash involving mucus membranes or skin necrosis: No Has patient had a PCN reaction that required hospitalization: No Has patient had a PCN reaction occurring within the last 10 years: No If all of the above answers are "NO", then may proceed with Cephalosporin use.   . Nickel Rash  . Penicillins Other (See Comments)    Has patient had a PCN reaction causing immediate rash, facial/tongue/throat swelling, SOB or lightheadedness  with hypotension: No Has patient had a PCN reaction causing severe rash involving mucus membranes or skin necrosis: No Has patient had a PCN reaction that required hospitalization: No Has patient had a PCN reaction occurring within the last 10 years: No If all of the above answers are "NO", then may proceed with Cephalosporin use.     Current Outpatient Medications:  .  ALPRAZolam (XANAX) 0.5 MG tablet, Take 1 tablet (0.5 mg total) by mouth 3 (three) times daily as needed for anxiety., Disp: 90 tablet, Rfl: 4 .  aspirin EC 81 MG tablet, Take 81 mg by mouth daily., Disp: , Rfl:  .  atorvastatin (LIPITOR) 80 MG tablet, Take 1 tablet (80 mg total) by mouth daily., Disp: 30 tablet, Rfl: 5 .  fluticasone (FLONASE) 50 MCG/ACT nasal spray, Place 2 sprays into both nostrils daily., Disp: 16 g, Rfl: 6 .  HYDROcodone-acetaminophen (NORCO/VICODIN) 5-325 MG tablet, Take 1 tablet by mouth every 8 (eight) hours as needed for moderate pain., Disp: 15 tablet, Rfl: 0 .  ibuprofen (ADVIL) 800 MG tablet, ONE TAB DAILY AS NEEDED. TRY TO MINIMIZE USE TO SEVERE PAIN., Disp: 30 tablet, Rfl: 3 .  nystatin cream (MYCOSTATIN), Apply 1 application topically 2 (two) times daily. Mix with equal amounts of hydrocortisone cream, Disp: 30 g, Rfl: 0 .  omeprazole (PRILOSEC) 20 MG capsule, Take 20 mg by mouth daily as needed (reflex). , Disp: , Rfl:  .  venlafaxine XR (EFFEXOR XR) 37.5 MG 24 hr capsule, Take 1 capsule (37.5 mg total) by mouth daily with breakfast., Disp: 30  capsule, Rfl: 0  Review of Systems  Constitutional: Negative for appetite change, chills, fatigue and fever.  Respiratory: Negative for chest tightness and shortness of breath.   Cardiovascular: Negative for chest pain and palpitations.  Gastrointestinal: Negative for abdominal pain, nausea and vomiting.  Neurological: Negative for dizziness and weakness.  Psychiatric/Behavioral: Positive for dysphoric mood. Negative for decreased concentration.     Social History   Tobacco Use  . Smoking status: Never Smoker  . Smokeless tobacco: Never Used  Substance Use Topics  . Alcohol use: No    Alcohol/week: 0.0 standard drinks      Objective:   There were no vitals taken for this visit.   Physical Exam  Limited due to not seeing patient in-person, but able to see and hear patient via internet video.   General appearance: alert, well developed, well nourished, cooperative and in no distress Psych: Appropriate mood and affect. Neurologic: Mental status: Alert, oriented to person, place, and time, thought content appropriate.      Assessment & Plan    1. Anxiety   2. Depression, unspecified depression type  Has weaned off of citalopram, but no improvement on low dose venlafaxine, will increase to 75mg  daily and follow up in about 3 weeks.      Lelon Huh, MD  [facility]

## 2018-09-08 ENCOUNTER — Ambulatory Visit (INDEPENDENT_AMBULATORY_CARE_PROVIDER_SITE_OTHER): Payer: Medicare Other | Admitting: Family Medicine

## 2018-09-08 ENCOUNTER — Other Ambulatory Visit: Payer: Self-pay

## 2018-09-08 DIAGNOSIS — F329 Major depressive disorder, single episode, unspecified: Secondary | ICD-10-CM

## 2018-09-08 DIAGNOSIS — F419 Anxiety disorder, unspecified: Secondary | ICD-10-CM

## 2018-09-08 DIAGNOSIS — F32A Depression, unspecified: Secondary | ICD-10-CM

## 2018-09-08 MED ORDER — NYSTATIN 100000 UNIT/GM EX CREA: 1.0000 "application " | TOPICAL_CREAM | Freq: Two times a day (BID) | CUTANEOUS | 0 refills | Status: DC

## 2018-09-08 MED ORDER — HYDROCODONE-ACETAMINOPHEN 5-325 MG PO TABS: 1.0000 | ORAL_TABLET | Freq: Three times a day (TID) | ORAL | 0 refills | Status: DC | PRN

## 2018-09-08 NOTE — Patient Instructions (Signed)
.   Please review the attached list of medications and notify my office if there are any errors.   . Please bring all of your medications to every appointment so we can make sure that our medication list is the same as yours.   . We will have flu vaccines available after Labor Day. Please go to your pharmacy or call the office in early September to schedule you flu shot.   

## 2018-09-09 IMAGING — CR DG ABDOMEN 1V
1 series · 1 of 1 positions shown · non-contrast
Comparison: 03/13/2017

CLINICAL DATA: Small bowel obstruction

EXAM:
ABDOMEN - 1 VIEW

[dg abd 1 view]
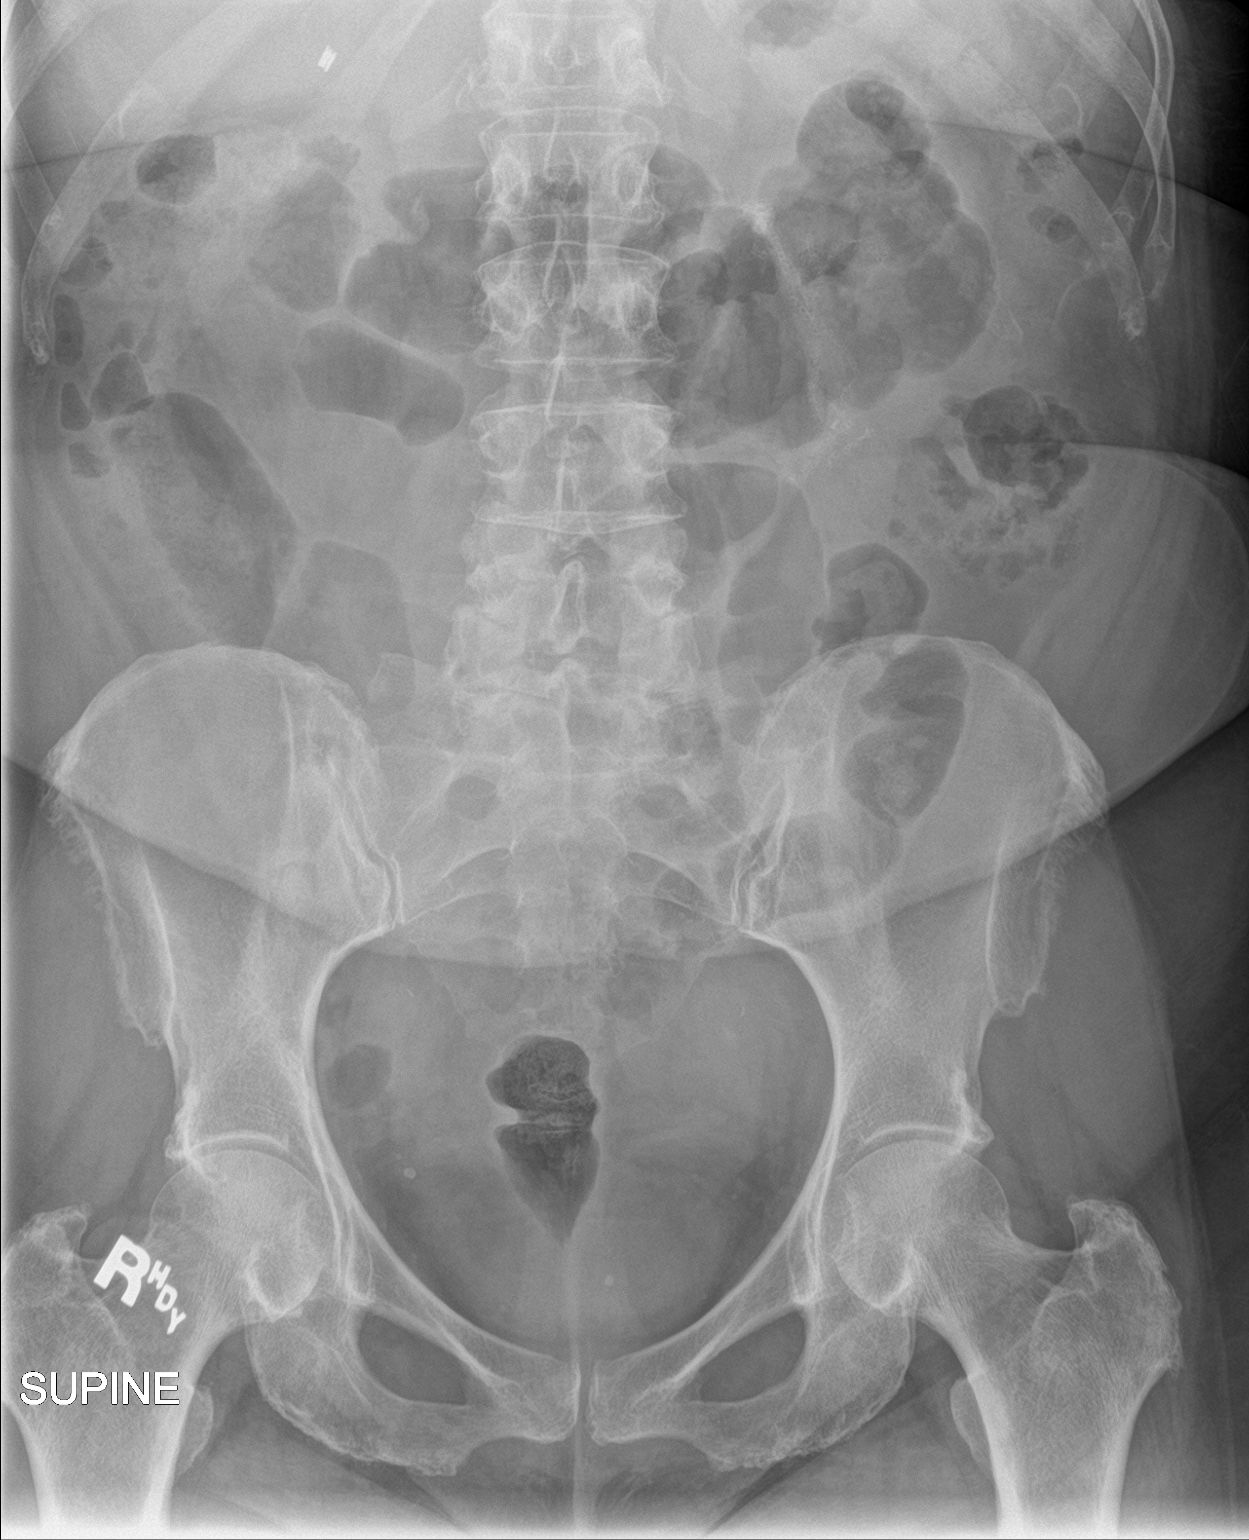

[1 of 1 positions shown; findings below may reference images not displayed]

FINDINGS: Gaseous distension of small bowel and colon. There is no bowel
dilatation to suggest obstruction. There is no evidence of
pneumoperitoneum, portal venous gas or pneumatosis.

There are no pathologic calcifications along the expected course of
the ureters.

The osseous structures are unremarkable.
IMPRESSION: Gaseous distension of small bowel and colon. No definite evidence of
small bowel obstruction.

## 2018-09-16 ENCOUNTER — Ambulatory Visit: Payer: Self-pay | Admitting: *Deleted

## 2018-09-16 DIAGNOSIS — F419 Anxiety disorder, unspecified: Secondary | ICD-10-CM

## 2018-09-16 DIAGNOSIS — F329 Major depressive disorder, single episode, unspecified: Secondary | ICD-10-CM

## 2018-09-16 NOTE — Chronic Care Management (AMB) (Signed)
  Chronic Care Management    Clinical Social Work Follow Up Note  09/16/2018 Name: [name] MRN: [id number] DOB: Mar 17, 1945  [name] is a 73 y.o. year old female who is a primary care patient of [name], [name], MD. The CCM team was consulted for assistance with Mental Health Counseling and Resources.   Review of patient status, including review of consultants reports, other relevant assessments, and collaboration with appropriate care team members and the patient's provider was performed as part of comprehensive patient evaluation and provision of chronic care management services.     Goals Addressed            This Visit's Progress   . "I would like to manage my depression better" (pt-stated)       Current Barriers:  [name] Mental Health Concerns   Clinical Social Work Clinical Goal(s):  [name] Over the next 90 days, client will work with SW to address concerns related to depression  Interventions: . Patient interviewed and appropriate assessments performed . Provided mental health counseling with regard to symptoms of depression and common triggers . Discussed family conflicts and lack of boundaries as contributing factors to her current depression . Explored patient's cognitive messages that reinforce symptoms of depression . Emotional support provided, exploring current coping strategies used to decrease depressed mood, emphasizing self preservation . Discussed plans with patient for ongoing care management follow up and provided patient with direct contact information for care management team  Patient Self Care Activities:  . Performs ADL's independently . Performs IADL's independently . Calls provider office for new concerns or questions  Please see past updates related to this goal by clicking on the "Past Updates" button in the selected goal          Follow Up Plan: SW will follow up with patient by phone over the next 2 weeks    [place], [place] Worker  [facility] [phone]

## 2018-09-16 NOTE — Patient Instructions (Signed)
Thank you allowing the Chronic Care Management Team to be a part of your care! It was a pleasure speaking with you today!  1. Please continue to utilized your self care strategies as discussed 2. Please call this CCM social worker if you have questions regarding your mental health needs  CCM (Chronic Care Management) Team   Trish Fountain RN, BSN Nurse Care Coordinator  [phone]  Ruben Reason PharmD  Clinical Pharmacist  [phone]   [place], LCSW Clinical Social Worker [phone]  Goals Addressed            This Visit's Progress   . "I would like to manage my depression better" (pt-stated)       Current Barriers:  [name] Mental Health Concerns   Clinical Social Work Clinical Goal(s):  [name] Over the next 90 days, client will work with SW to address concerns related to depression  Interventions: . Patient interviewed and appropriate assessments performed . Provided mental health counseling with regard to symptoms of depression and common triggers . Discussed family conflicts and lack of boundaries as contributing factors to her current depression . Explored patient's cognitive messages that reinforce symptoms of depression . Emotional support provided, exploring current coping strategies used to decrease depressed mood, emphasizing self preservation . Discussed plans with patient for ongoing care management follow up and provided patient with direct contact information for care management team  Patient Self Care Activities:  . Performs ADL's independently . Performs IADL's independently . Calls provider office for new concerns or questions  Please see past updates related to this goal by clicking on the "Past Updates" button in the selected goal          The patient verbalized understanding of instructions provided today and declined a print copy of patient instruction materials.   Telephone follow up appointment with care management team member  scheduled for:09/30/18

## 2018-09-18 ENCOUNTER — Telehealth: Payer: Self-pay | Admitting: Family Medicine

## 2018-09-18 MED ORDER — VENLAFAXINE HCL ER 37.5 MG PO CP24: 37.5000 mg | ORAL_CAPSULE | Freq: Every day | ORAL | 3 refills | Status: DC

## 2018-09-18 NOTE — Telephone Encounter (Signed)
Pt advised.   Thanks,   -Espiridion Supinski  

## 2018-09-18 NOTE — Telephone Encounter (Signed)
Pt called saying she is taking Venlafaxine 75mg .  She states when she takes this mg she cant sleep at night.  She wants to know if she can go back to the 37.5 mg.  She also needs a new refill for the 37.5 mg  [facility]  CB#  [phone]  [place]

## 2018-09-18 NOTE — Telephone Encounter (Signed)
Ok, sent refill for 37.5mg  capsules

## 2018-09-29 ENCOUNTER — Other Ambulatory Visit: Payer: Self-pay

## 2018-09-29 ENCOUNTER — Ambulatory Visit (INDEPENDENT_AMBULATORY_CARE_PROVIDER_SITE_OTHER): Payer: Medicare Other

## 2018-09-29 DIAGNOSIS — Z0000 Encounter for general adult medical examination without abnormal findings: Secondary | ICD-10-CM | POA: Diagnosis not present

## 2018-09-29 NOTE — Patient Instructions (Signed)
[name] , Thank you for taking time to come for your Medicare Wellness Visit. I appreciate your ongoing commitment to your health goals. Please review the following plan we discussed and let me know if I can assist you in the future.   Screening recommendations/referrals: Colonoscopy: Due- ptt declines referral today.  Mammogram: Due- pt declined order today.  Bone Density: Up to date, normal DEXA previously. No repeat needed at this time.  Recommended yearly ophthalmology/optometry visit for glaucoma screening and checkup Recommended yearly dental visit for hygiene and checkup  Vaccinations: Influenza vaccine: Due fall 2020 Pneumococcal vaccine: Pneumovax 23 due Tdap vaccine: Pt declines today.  Shingles vaccine: Pt declines today.     Advanced directives: Please bring a copy of your POA (Power of Attorney) and/or Living Will to your next appointment.   Conditions/risks identified: Continue current diet plan of cutting back on fast foods and eating healthier meals.   Next appointment: 10/05/18 @ 8:20 for a follow up with Dr [name].    Preventive Care 71 Years and Older, Female Preventive care refers to lifestyle choices and visits with your health care provider that can promote health and wellness. What does preventive care include?  A yearly physical exam. This is also called an annual well check.  Dental exams once or twice a year.  Routine eye exams. Ask your health care provider how often you should have your eyes checked.  Personal lifestyle choices, including:  Daily care of your teeth and gums.  Regular physical activity.  Eating a healthy diet.  Avoiding tobacco and drug use.  Limiting alcohol use.  Practicing safe sex.  Taking low-dose aspirin every day.  Taking vitamin and mineral supplements as recommended by your health care provider. What happens during an annual well check? The services and screenings done by your health care provider during your  annual well check will depend on your age, overall health, lifestyle risk factors, and family history of disease. Counseling  Your health care provider may ask you questions about your:  Alcohol use.  Tobacco use.  Drug use.  Emotional well-being.  Home and relationship well-being.  Sexual activity.  Eating habits.  History of falls.  Memory and ability to understand (cognition).  Work and work Statistician.  Reproductive health. Screening  You may have the following tests or measurements:  Height, weight, and BMI.  Blood pressure.  Lipid and cholesterol levels. These may be checked every 5 years, or more frequently if you are over 63 years old.  Skin check.  Lung cancer screening. You may have this screening every year starting at age 53 if you have a 30-pack-year history of smoking and currently smoke or have quit within the past 15 years.  Fecal occult blood test (FOBT) of the stool. You may have this test every year starting at age 71.  Flexible sigmoidoscopy or colonoscopy. You may have a sigmoidoscopy every 5 years or a colonoscopy every 10 years starting at age 21.  Hepatitis C blood test.  Hepatitis B blood test.  Sexually transmitted disease (STD) testing.  Diabetes screening. This is done by checking your blood sugar (glucose) after you have not eaten for a while (fasting). You may have this done every 1-3 years.  Bone density scan. This is done to screen for osteoporosis. You may have this done starting at age 38.  Mammogram. This may be done every 1-2 years. Talk to your health care provider about how often you should have regular mammograms. Talk with  your health care provider about your test results, treatment options, and if necessary, the need for more tests. Vaccines  Your health care provider may recommend certain vaccines, such as:  Influenza vaccine. This is recommended every year.  Tetanus, diphtheria, and acellular pertussis (Tdap, Td)  vaccine. You may need a Td booster every 10 years.  Zoster vaccine. You may need this after age 35.  Pneumococcal 13-valent conjugate (PCV13) vaccine. One dose is recommended after age 39.  Pneumococcal polysaccharide (PPSV23) vaccine. One dose is recommended after age 40. Talk to your health care provider about which screenings and vaccines you need and how often you need them. This information is not intended to replace advice given to you by your health care provider. Make sure you discuss any questions you have with your health care provider. Document Released: 03/03/2015 Document Revised: 10/25/2015 Document Reviewed: 12/06/2014 Elsevier Interactive Patient Education  2017 [place] Prevention in the Home Falls can cause injuries. They can happen to people of all ages. There are many things you can do to make your home safe and to help prevent falls. What can I do on the outside of my home?  Regularly fix the edges of walkways and driveways and fix any cracks.  Remove anything that might make you trip as you walk through a door, such as a raised step or threshold.  Trim any bushes or trees on the path to your home.  Use bright outdoor lighting.  Clear any walking paths of anything that might make someone trip, such as rocks or tools.  Regularly check to see if handrails are loose or broken. Make sure that both sides of any steps have handrails.  Any raised decks and porches should have guardrails on the edges.  Have any leaves, snow, or ice cleared regularly.  Use sand or salt on walking paths during winter.  Clean up any spills in your garage right away. This includes oil or grease spills. What can I do in the bathroom?  Use night lights.  Install grab bars by the toilet and in the tub and shower. Do not use towel bars as grab bars.  Use non-skid mats or decals in the tub or shower.  If you need to sit down in the shower, use a plastic, non-slip stool.   Keep the floor dry. Clean up any water that spills on the floor as soon as it happens.  Remove soap buildup in the tub or shower regularly.  Attach bath mats securely with double-sided non-slip rug tape.  Do not have throw rugs and other things on the floor that can make you trip. What can I do in the bedroom?  Use night lights.  Make sure that you have a light by your bed that is easy to reach.  Do not use any sheets or blankets that are too big for your bed. They should not hang down onto the floor.  Have a firm chair that has side arms. You can use this for support while you get dressed.  Do not have throw rugs and other things on the floor that can make you trip. What can I do in the kitchen?  Clean up any spills right away.  Avoid walking on wet floors.  Keep items that you use a lot in easy-to-reach places.  If you need to reach something above you, use a strong step stool that has a grab bar.  Keep electrical cords out of the way.  Do not  use floor polish or wax that makes floors slippery. If you must use wax, use non-skid floor wax.  Do not have throw rugs and other things on the floor that can make you trip. What can I do with my stairs?  Do not leave any items on the stairs.  Make sure that there are handrails on both sides of the stairs and use them. Fix handrails that are broken or loose. Make sure that handrails are as long as the stairways.  Check any carpeting to make sure that it is firmly attached to the stairs. Fix any carpet that is loose or worn.  Avoid having throw rugs at the top or bottom of the stairs. If you do have throw rugs, attach them to the floor with carpet tape.  Make sure that you have a light switch at the top of the stairs and the bottom of the stairs. If you do not have them, ask someone to add them for you. What else can I do to help prevent falls?  Wear shoes that:  Do not have high heels.  Have rubber bottoms.  Are  comfortable and fit you well.  Are closed at the toe. Do not wear sandals.  If you use a stepladder:  Make sure that it is fully opened. Do not climb a closed stepladder.  Make sure that both sides of the stepladder are locked into place.  Ask someone to hold it for you, if possible.  Clearly mark and make sure that you can see:  Any grab bars or handrails.  First and last steps.  Where the edge of each step is.  Use tools that help you move around (mobility aids) if they are needed. These include:  Canes.  Walkers.  Scooters.  Crutches.  Turn on the lights when you go into a dark area. Replace any light bulbs as soon as they burn out.  Set up your furniture so you have a clear path. Avoid moving your furniture around.  If any of your floors are uneven, fix them.  If there are any pets around you, be aware of where they are.  Review your medicines with your doctor. Some medicines can make you feel dizzy. This can increase your chance of falling. Ask your doctor what other things that you can do to help prevent falls. This information is not intended to replace advice given to you by your health care provider. Make sure you discuss any questions you have with your health care provider. Document Released: 12/01/2008 Document Revised: 07/13/2015 Document Reviewed: 03/11/2014 Elsevier Interactive Patient Education  2017 [employer].

## 2018-09-29 NOTE — Progress Notes (Signed)
Subjective:   [name] is a 73 y.o. female who presents for Medicare Annual (Subsequent) preventive examination.    This visit is being conducted through telemedicine due to the COVID-19 pandemic. This patient has given me verbal consent via doximity to conduct this visit, patient states they are participating from their home address. Some vital signs may be absent or patient reported.    Patient identification: identified by name, DOB, and current address  Review of Systems:  N/A  Cardiac Risk Factors include: advanced age (>56men, >65 women);dyslipidemia;hypertension     Objective:     Vitals: There were no vitals taken for this visit.  There is no height or weight on file to calculate BMI. Unable to obtain vitals due to visit being conducted via telephonically.   Advanced Directives 09/29/2018 02/04/2018 02/04/2018 09/26/2017 03/13/2017 03/13/2017 03/02/2017  Does Patient Have a Medical Advance Directive? Yes Yes Yes No No No No  Type of Paramedic of [place];Living will [place];Living will [place];Living will - - - -  Does patient want to make changes to medical advance directive? - No - Patient declined No - Patient declined - - - -  Copy of [place] in Chart? No - copy requested No - copy requested - - - - -  Would patient like information on creating a medical advance directive? - - - Yes (MAU/Ambulatory/Procedural Areas - Information given) No - Patient declined No - Patient declined -    Tobacco Social History   Tobacco Use  Smoking Status Never Smoker  Smokeless Tobacco Never Used     Counseling given: Not Answered   Clinical Intake:  Pre-visit preparation completed: Yes  Pain : No/denies pain Pain Score: 0-No pain     Nutritional Risks: None Diabetes: No  How often do you need to have someone help you when you read instructions, pamphlets, or other written materials  from your doctor or pharmacy?: 1 - Never  Interpreter Needed?: No  Information entered by :: [facility], LPN  Past Medical History:  Diagnosis Date  . Anxiety   . Arthritis    lower spine  . Back pain    lower back - S/P lifting injury  . Complication of anesthesia    makes hair brittle  . GERD (gastroesophageal reflux disease)   . Hyperlipidemia   . Hypertension   . Neuromuscular disorder (HCC)    numbness legs and feet s/p lower back injury  . Partial bowel obstruction ([place]) 02/04/2018  . Stroke [facility])    "mini - strokes" 2009 - no deficit  . Vertigo    none recently  . Vitamin D deficiency   . Wears contact lenses    Past Surgical History:  Procedure Laterality Date  . ABDOMINAL HYSTERECTOMY  1987  . CARDIAC CATHETERIZATION  02/2007  . COLON SURGERY  03/12/2012   Dr Phylis Bougie  . COLONOSCOPY WITH PROPOFOL N/A 09/26/2014   Procedure: COLONOSCOPY WITH PROPOFOL;  Surgeon: Lucilla Lame, MD;  Location: [place];  Service: Endoscopy;  Laterality: N/A;  marker (tattoo) used in colon  . ESOPHAGEAL DILATION N/A 02/02/2016   Procedure: ESOPHAGEAL DILATION;  Surgeon: Lucilla Lame, MD;  Location: [place];  Service: Endoscopy;  Laterality: N/A;  . ESOPHAGOGASTRODUODENOSCOPY (EGD) WITH PROPOFOL N/A 02/02/2016   Procedure: ESOPHAGOGASTRODUODENOSCOPY (EGD) WITH PROPOFOL;  Surgeon: Lucilla Lame, MD;  Location: [place];  Service: Endoscopy;  Laterality: N/A;  . POLYPECTOMY  09/26/2014  Procedure: POLYPECTOMY;  Surgeon: Lucilla Lame, MD;  Location: [place];  Service: Endoscopy;;  . TUBAL LIGATION  1972   Family History  Problem Relation Age of Onset  . Diabetes Mother   . Heart disease Mother   . Hypertension Mother   . Mental illness Mother   . Cancer Father        lung cancer  . Drug abuse Brother   . Multiple sclerosis Brother    Social History   Socioeconomic History  . Marital status: Married    Spouse name: Not on file  . Number of  children: 3  . Years of education: Not on file  . Highest education level: 12th grade  Occupational History  . Occupation: retired  Scientific laboratory technician  . Financial resource strain: Not hard at all  . Food insecurity    Worry: Never true    Inability: Never true  . Transportation needs    Medical: No    Non-medical: No  Tobacco Use  . Smoking status: Never Smoker  . Smokeless tobacco: Never Used  Substance and Sexual Activity  . Alcohol use: No    Alcohol/week: 0.0 standard drinks  . Drug use: No  . Sexual activity: Not Currently  Lifestyle  . Physical activity    Days per week: 0 days    Minutes per session: 0 min  . Stress: Not at all  Relationships  . Social Herbalist on phone: Patient refused    Gets together: Patient refused    Attends religious service: Patient refused    Active member of club or organization: Patient refused    Attends meetings of clubs or organizations: Patient refused    Relationship status: Patient refused  Other Topics Concern  . Not on file  Social History Narrative  . Not on file    Outpatient Encounter Medications as of 09/29/2018  Medication Sig  . ALPRAZolam (XANAX) 0.5 MG tablet Take 1 tablet (0.5 mg total) by mouth 3 (three) times daily as needed for anxiety.  [name] aspirin EC 81 MG tablet Take 81 mg by mouth daily.  [name] atorvastatin (LIPITOR) 80 MG tablet Take 1 tablet (80 mg total) by mouth daily.  . fluticasone (FLONASE) 50 MCG/ACT nasal spray Place 2 sprays into both nostrils daily.  [name] ibuprofen (ADVIL) 800 MG tablet ONE TAB DAILY AS NEEDED. TRY TO MINIMIZE USE TO SEVERE PAIN.  [name] nystatin cream (MYCOSTATIN) Apply 1 application topically 2 (two) times daily. Mix with equal amounts of hydrocortisone cream  . omeprazole (PRILOSEC) 20 MG capsule Take 20 mg by mouth daily as needed (reflex).   . venlafaxine XR (EFFEXOR-XR) 37.5 MG 24 hr capsule Take 1 capsule (37.5 mg total) by mouth daily. PLEASE NOTE CHANGE BACK TO 37.5MG  SINCE 75mg   capsule was not tolerated.  [name] HYDROcodone-acetaminophen (NORCO/VICODIN) 5-325 MG tablet Take 1 tablet by mouth every 8 (eight) hours as needed for moderate pain. (Patient not taking: Reported on 09/29/2018)   No facility-administered encounter medications on file as of 09/29/2018.     Activities of Daily Living In your present state of health, do you have any difficulty performing the following activities: 09/29/2018 02/04/2018  Hearing? N -  Vision? N N  Difficulty concentrating or making decisions? N N  Walking or climbing stairs? N N  Dressing or bathing? N N  Doing errands, shopping? N N  Preparing Food and eating ? N -  Using the Toilet? N -  In the past  six months, have you accidently leaked urine? N -  Do you have problems with loss of bowel control? N -  Managing your Medications? N -  Managing your Finances? N -  Housekeeping or managing your Housekeeping? N -  Some recent data might be hidden    Patient Care Team: Birdie Sons, MD as PCP - General (Family Medicine) Lucilla Lame, MD as Consulting Physician (Gastroenterology) Bary Castilla, Forest Gleason, MD as Consulting Physician (General Surgery) Vern Claude, [place] as [place] [facility]    Assessment:   This is a routine wellness examination for [place].  Exercise Activities and Dietary recommendations Current Exercise Habits: The patient does not participate in regular exercise at present, Exercise limited by: None identified  Goals      Patient Stated   . "I would like to manage my depression better" (pt-stated)     Current Barriers:  [name] Mental Health Concerns   Clinical Social Work Clinical Goal(s):  [name] Over the next 90 days, client will work with SW to address concerns related to depression  Interventions: . Patient interviewed and appropriate assessments performed . Provided mental health counseling with regard to symptoms of depression and common triggers . Discussed family conflicts and  lack of boundaries as contributing factors to her current depression . Explored patient's cognitive messages that reinforce symptoms of depression . Emotional support provided, exploring current coping strategies used to decrease depressed mood, emphasizing self preservation . Discussed plans with patient for ongoing care management follow up and provided patient with direct contact information for care management team  Patient Self Care Activities:  . Performs ADL's independently . Performs IADL's independently . Calls provider office for new concerns or questions  Please see past updates related to this goal by clicking on the "Past Updates" button in the selected goal        Other   . DIET - REDUCE PORTION SIZE     Recommend decreasing portion sizes in half and eating 3 small meals a day with 2 healthy snacks in between.     . Eat more fruits and vegetables     Recommend increasing consumption of fruits and vegetables daily (2 servings).       Fall Risk: Fall Risk  09/29/2018 10/01/2017 09/26/2017 09/25/2016 02/21/2016  Falls in the past year? 0 Yes Yes Yes No  Number falls in past yr: - 2 or more 1 1 -  Injury with Fall? - - No No -  Follow up - - Falls prevention discussed Falls prevention discussed -    FALL RISK PREVENTION PERTAINING TO THE HOME:  Any stairs in or around the home? No  If so, are there any without handrails? N/A  Home free of loose throw rugs in walkways, pet beds, electrical cords, etc? Yes  Adequate lighting in your home to reduce risk of falls? Yes   ASSISTIVE DEVICES UTILIZED TO PREVENT FALLS:  Life alert? No  Use of a cane, walker or w/c? No  Grab bars in the bathroom? No  Shower chair or bench in shower? No  Elevated toilet seat or a handicapped toilet? Yes    TIMED UP AND GO:  Was the test performed? No .    Depression Screen PHQ 2/9 Scores 09/29/2018 09/02/2018 10/01/2017 09/26/2017  PHQ - 2 Score 0 1 6 6   PHQ- 9 Score - - 18 18      Cognitive Function: Declined today.      6CIT Screen 09/25/2016  What Year? 0 points  What month? 0 points  What time? 0 points  Count back from 20 0 points  Months in reverse 0 points  Repeat phrase 2 points  Total Score 2    Immunization History  Administered Date(s) Administered  . Pneumococcal Conjugate-13 01/19/2014    Qualifies for Shingles Vaccine? Yes . Due for Shingrix. Education has been provided regarding the importance of this vaccine. Pt has been advised to call insurance company to determine out of pocket expense. Advised may also receive vaccine at local pharmacy or Health Dept. Verbalized acceptance and understanding.  Tdap: Although this vaccine is not a covered service during a Wellness Exam, does the patient still wish to receive this vaccine today?  No .    Flu Vaccine: Due fall 2020  Pneumococcal Vaccine: Due for Pneumococcal vaccine. Does the patient want to receive this vaccine today?  No .   Screening Tests Health Maintenance  Topic Date Due  . PNA vac Low Risk Adult (2 of 2 - PPSV23) 01/20/2015  . COLONOSCOPY  09/25/2017  . INFLUENZA VACCINE  09/19/2018  . MAMMOGRAM  10/02/2018 (Originally 11/15/1997)  . TETANUS/TDAP  02/18/2026 (Originally 11/16/1966)  . DEXA SCAN  Completed  . Hepatitis C Screening  Completed    Cancer Screenings:  Colorectal Screening: Completed 09/26/14. Repeat every 3 years. Pt declined order today.   Mammogram: Completed 10/19/13. Repeat every year. Pt declined an order for this today.   Bone Density: Completed 10/19/13. Results reflect NORMAL. No repeat needed due to previous normal DEXA results.   Lung Cancer Screening: (Low Dose CT Chest recommended if Age 71-80 years, 30 pack-year currently smoking OR have quit w/in 15years.) does not qualify.   Additional Screening:  Hepatitis C Screening: Up to date  Vision Screening: Recommended annual ophthalmology exams for early detection of glaucoma and other disorders of the  eye.  Dental Screening: Recommended annual dental exams for proper oral hygiene  Community Resource Referral:  CRR required this visit?  No       Plan:  I have personally reviewed and addressed the Medicare Annual Wellness questionnaire and have noted the following in the patient's chart:  A. Medical and social history B. Use of alcohol, tobacco or illicit drugs  C. Current medications and supplements D. Functional ability and status E.  Nutritional status F.  Physical activity G. Advance directives H. List of other physicians I.  Hospitalizations, surgeries, and ER visits in previous 12 months J.  [place] such as hearing and vision if needed, cognitive and depression L. Referrals and appointments   In addition, I have reviewed and discussed with patient certain preventive protocols, quality metrics, and best practice recommendations. A written personalized care plan for preventive services as well as general preventive health recommendations were provided to patient. Nurse Health Advisor  Signed,    Peightyn Roberson [place], [place]  [phone] Nurse Health Advisor   Nurse Notes: Pt declined a colonoscopy referral, mammogram order, future order for a  tetanus vaccine and Pneumovax 23 vaccine.

## 2018-10-05 ENCOUNTER — Other Ambulatory Visit: Payer: Self-pay

## 2018-10-05 ENCOUNTER — Ambulatory Visit (INDEPENDENT_AMBULATORY_CARE_PROVIDER_SITE_OTHER): Payer: Medicare Other | Admitting: Family Medicine

## 2018-10-05 ENCOUNTER — Ambulatory Visit: Payer: Self-pay | Admitting: Family Medicine

## 2018-10-05 DIAGNOSIS — F419 Anxiety disorder, unspecified: Secondary | ICD-10-CM

## 2018-10-05 DIAGNOSIS — F329 Major depressive disorder, single episode, unspecified: Secondary | ICD-10-CM

## 2018-10-05 NOTE — Progress Notes (Signed)
Patient: [name] Female    DOB: [birth year]   73 y.o.   MRN: [id number] Visit Date: 10/05/2018  Today's Provider: Lelon Huh, MD   Chief Complaint  Patient presents with  . Anxiety   Subjective:    Virtual Visit via Video Note  I connected with [name] on 10/05/18 at  8:00 AM EDT by a video enabled telemedicine application and verified that I am speaking with the correct person using two identifiers.   I discussed the limitations of evaluation and management by telemedicine and the availability of in person appointments. The patient expressed understanding and agreed to proceed.   Interactive audio and video communications were attempted, although failed due to patient's inability to connect to video. Continued visit with audio only interaction with patient agreement.  HPI Patient was seen 08/11/2018 with hallucinations after citalopram was increased and previous visit. Changed from citalopram to venlafaxine 37.5 for depression and anxiety which seemed to have worsened when she returned on 09/08/2018. Had her increase to 2 x 37.5 venlafaxine daily, but she called back 10 days later stating she couldn't sleep on the higher dose. Has since been back on 1 x 37.5mg  a day and states she is doing well. Having a little bit of anxiety, but has improved compared to a few months ago. She wishes to continue current dose of venlafaxine.   Allergies  Allergen Reactions  . Levofloxacin     Tongue and mouth swelling  . Calcium Channel Blockers     Other reaction(s): Dizziness  . Amoxicillin Other (See Comments)    Has patient had a PCN reaction causing immediate rash, facial/tongue/throat swelling, SOB or lightheadedness with hypotension: No Has patient had a PCN reaction causing severe rash involving mucus membranes or skin necrosis: No Has patient had a PCN reaction that required hospitalization: No Has patient had a PCN reaction occurring within the last 10 years: No If  all of the above answers are "NO", then may proceed with Cephalosporin use.   . Nickel Rash  . Penicillins Other (See Comments)    Has patient had a PCN reaction causing immediate rash, facial/tongue/throat swelling, SOB or lightheadedness with hypotension: No Has patient had a PCN reaction causing severe rash involving mucus membranes or skin necrosis: No Has patient had a PCN reaction that required hospitalization: No Has patient had a PCN reaction occurring within the last 10 years: No If all of the above answers are "NO", then may proceed with Cephalosporin use.     Current Outpatient Medications:  .  ALPRAZolam (XANAX) 0.5 MG tablet, Take 1 tablet (0.5 mg total) by mouth 3 (three) times daily as needed for anxiety., Disp: 90 tablet, Rfl: 4 .  aspirin EC 81 MG tablet, Take 81 mg by mouth daily., Disp: , Rfl:  .  atorvastatin (LIPITOR) 80 MG tablet, Take 1 tablet (80 mg total) by mouth daily., Disp: 30 tablet, Rfl: 5 .  fluticasone (FLONASE) 50 MCG/ACT nasal spray, Place 2 sprays into both nostrils daily., Disp: 16 g, Rfl: 6 .  HYDROcodone-acetaminophen (NORCO/VICODIN) 5-325 MG tablet, Take 1 tablet by mouth every 8 (eight) hours as needed for moderate pain. (Patient not taking: Reported on 09/29/2018), Disp: 15 tablet, Rfl: 0 .  ibuprofen (ADVIL) 800 MG tablet, ONE TAB DAILY AS NEEDED. TRY TO MINIMIZE USE TO SEVERE PAIN., Disp: 30 tablet, Rfl: 3 .  nystatin cream (MYCOSTATIN), Apply 1 application topically 2 (two) times daily. Mix with equal amounts  of hydrocortisone cream, Disp: 30 g, Rfl: 0 .  omeprazole (PRILOSEC) 20 MG capsule, Take 20 mg by mouth daily as needed (reflex). , Disp: , Rfl:  .  venlafaxine XR (EFFEXOR-XR) 37.5 MG 24 hr capsule, Take 1 capsule (37.5 mg total) by mouth daily. PLEASE NOTE CHANGE BACK TO 37.5MG  SINCE 75mg  capsule was not tolerated., Disp: 30 capsule, Rfl: 3  Review of Systems  Constitutional: Negative for appetite change, chills, fatigue and fever.   Respiratory: Negative for chest tightness and shortness of breath.   Cardiovascular: Negative for chest pain and palpitations.  Gastrointestinal: Negative for abdominal pain, nausea and vomiting.  Neurological: Negative for dizziness and weakness.    Social History   Tobacco Use  . Smoking status: Never Smoker  . Smokeless tobacco: Never Used  Substance Use Topics  . Alcohol use: No    Alcohol/week: 0.0 standard drinks      Objective:   There were no vitals taken for this visit.  Physical Exam  Awake alert and oriented x 3. Visit was conducted over telephone limiting physical exam.     Assessment & Plan    1. Major depressive disorder with single episode, remission status unspecified   2. Anxiety  She is satisfied with current treatment of 37.5mg  venlafaxine daily. Advised it is common to develop some tolerance to new medications after a few months, and if she starts to feels that medication is not working as well we can try going back up to 2 x 37.5mg  a day.     I discussed the assessment and treatment plan with the patient. The patient was provided an opportunity to ask questions and all were answered. The patient agreed with the plan and demonstrated an understanding of the instructions.   The patient was advised to call back or seek an in-person evaluation if the symptoms worsen or if the condition fails to improve as anticipated.  I provided 8 minutes of non-face-to-face time during this encounter.      Lelon Huh, MD  [facility]

## 2018-10-09 ENCOUNTER — Ambulatory Visit (INDEPENDENT_AMBULATORY_CARE_PROVIDER_SITE_OTHER): Payer: Medicare Other | Admitting: *Deleted

## 2018-10-09 DIAGNOSIS — F329 Major depressive disorder, single episode, unspecified: Secondary | ICD-10-CM

## 2018-10-09 DIAGNOSIS — F419 Anxiety disorder, unspecified: Secondary | ICD-10-CM

## 2018-10-09 NOTE — Patient Instructions (Signed)
Thank you allowing the Chronic Care Management Team to be a part of your care! It was a pleasure speaking with you today!  1. Please continue to utilize coping strategies discussed today to manage your anxiety 2. Please call this social worker with any questions or concerns regarding your mental health care  CCM (Chronic Care Management) Team   Trish Fountain RN, BSN Nurse Care Coordinator  [phone]  Ruben Reason PharmD  Clinical Pharmacist  [phone]   [place], LCSW Clinical Social Worker [phone]  Goals Addressed            This Visit's Progress   . "I would like to manage my depression better" (pt-stated)       Current Barriers:  [name] Mental Health Concerns   Clinical Social Work Clinical Goal(s):  [name] Over the next 90 days, client will work with SW to address concerns related to depression  Interventions: . Patient interviewed and appropriate assessments performed . Continued to provide mental health counseling with regard to patient's symptoms of depression and common triggers . Processed patient's feelings regarding recent deaths in her community and it's affects on her functioning . Continued to discuss increased care taking responsibilities with family member and the importance of establishing strong boundaries  . Continued to explored patient's cognitive messages that reinforce symptoms of depression offering alternative perspectives . Emotional support provided, reinforcing positive current coping strategies used to decrease depressed mood . Discussed plans with patient for ongoing care management follow up and provided patient with direct contact information for care management team  Patient Self Care Activities:  . Performs ADL's independently . Performs IADL's independently . Calls provider office for new concerns or questions  Please see past updates related to this goal by clicking on the "Past Updates" button in the selected goal           The patient verbalized understanding of instructions provided today and declined a print copy of patient instruction materials.   Telephone follow up appointment with care management team member scheduled for: 10/22/18

## 2018-10-09 NOTE — Chronic Care Management (AMB) (Signed)
.  Chronic Care Management    Clinical Social Work Follow Up Note  10/09/2018 Name: [name] MRN: [id number] DOB: October 24, 1945  [name] is a 73 y.o. year old female who is a primary care patient of [name], [name], MD. The CCM team was consulted for assistance with Mental Health Counseling and Resources.   Review of patient status, including review of consultants reports, other relevant assessments, and collaboration with appropriate care team members and the patient's provider was performed as part of comprehensive patient evaluation and provision of chronic care management services.     Outpatient Encounter Medications as of 10/09/2018  Medication Sig  . ALPRAZolam (XANAX) 0.5 MG tablet Take 1 tablet (0.5 mg total) by mouth 3 (three) times daily as needed for anxiety.  [name] aspirin EC 81 MG tablet Take 81 mg by mouth daily.  [name] atorvastatin (LIPITOR) 80 MG tablet Take 1 tablet (80 mg total) by mouth daily.  . fluticasone (FLONASE) 50 MCG/ACT nasal spray Place 2 sprays into both nostrils daily.  [name] HYDROcodone-acetaminophen (NORCO/VICODIN) 5-325 MG tablet Take 1 tablet by mouth every 8 (eight) hours as needed for moderate pain. (Patient not taking: Reported on 09/29/2018)  . ibuprofen (ADVIL) 800 MG tablet ONE TAB DAILY AS NEEDED. TRY TO MINIMIZE USE TO SEVERE PAIN.  [name] nystatin cream (MYCOSTATIN) Apply 1 application topically 2 (two) times daily. Mix with equal amounts of hydrocortisone cream  . omeprazole (PRILOSEC) 20 MG capsule Take 20 mg by mouth daily as needed (reflex).   . venlafaxine XR (EFFEXOR-XR) 37.5 MG 24 hr capsule Take 1 capsule (37.5 mg total) by mouth daily. PLEASE NOTE CHANGE BACK TO 37.5MG  SINCE 75mg  capsule was not tolerated.   No facility-administered encounter medications on file as of 10/09/2018.      Goals Addressed            This Visit's Progress   . "I would like to manage my depression better" (pt-stated)       This social worker spoke to patient by  phone today. Patient discussed some difficulty dealing with the 2 deaths in her community, however discussed being able to put the events in perspective and provide support to the families. Patient able to discuss the importance of continued use of her coping strategies and establishing boundaries with family members.  Current Barriers:  [name] Mental Health Concerns   Clinical Social Work Clinical Goal(s):  [name] Over the next 90 days, client will work with SW to address concerns related to depression  Interventions: . Patient interviewed and appropriate assessments performed . Continued to provide mental health counseling with regard to patient's symptoms of depression and common triggers . Processed patient's feelings regarding recent deaths in her community and it's affects on her functioning . Continued to discuss increased care taking responsibilities with family member and the importance of establishing strong boundaries  . Continued to explored patient's cognitive messages that reinforce symptoms of depression offering alternative perspectives . Emotional support provided, reinforcing positive current coping strategies used to decrease depressed mood . Discussed plans with patient for ongoing care management follow up and provided patient with direct contact information for care management team  Patient Self Care Activities:  . Performs ADL's independently . Performs IADL's independently . Calls provider office for new concerns or questions  Please see past updates related to this goal by clicking on the "Past Updates" button in the selected goal          Follow Up Plan:  Appointment scheduled for SW follow up with client by phone on: 10/23/18    Elliot Gurney, [place] Worker  [facility] [phone]

## 2018-10-10 ENCOUNTER — Other Ambulatory Visit: Payer: Self-pay | Admitting: Family Medicine

## 2018-10-23 ENCOUNTER — Telehealth: Payer: Self-pay

## 2018-10-23 ENCOUNTER — Ambulatory Visit (INDEPENDENT_AMBULATORY_CARE_PROVIDER_SITE_OTHER): Payer: Medicare Other | Admitting: *Deleted

## 2018-10-23 DIAGNOSIS — F419 Anxiety disorder, unspecified: Secondary | ICD-10-CM

## 2018-10-23 DIAGNOSIS — F329 Major depressive disorder, single episode, unspecified: Secondary | ICD-10-CM

## 2018-10-23 NOTE — Chronic Care Management (AMB) (Signed)
Chronic Care Management    Clinical Social Work Follow Up Note  10/23/2018 Name: [name] MRN: [id number] DOB: 1945/08/28  [name] is a 73 y.o. year old female who is a primary care patient of [name], [name], MD. The CCM team was consulted for assistance with Mental Health Counseling and Resources.   Review of patient status, including review of consultants reports, other relevant assessments, and collaboration with appropriate care team members and the patient's provider was performed as part of comprehensive patient evaluation and provision of chronic care management services.     Outpatient Encounter Medications as of 10/23/2018  Medication Sig  . ALPRAZolam (XANAX) 0.5 MG tablet Take 1 tablet (0.5 mg total) by mouth 3 (three) times daily as needed for anxiety.  [name] aspirin EC 81 MG tablet Take 81 mg by mouth daily.  [name] atorvastatin (LIPITOR) 80 MG tablet Take 1 tablet (80 mg total) by mouth daily.  . fluticasone (FLONASE) 50 MCG/ACT nasal spray Place 2 sprays into both nostrils daily.  [name] HYDROcodone-acetaminophen (NORCO/VICODIN) 5-325 MG tablet Take 1 tablet by mouth every 8 (eight) hours as needed for moderate pain. (Patient not taking: Reported on 09/29/2018)  . ibuprofen (ADVIL) 800 MG tablet ONE TAB DAILY AS NEEDED. TRY TO MINIMIZE USE TO SEVERE PAIN.  [name] nystatin cream (MYCOSTATIN) Apply 1 application topically 2 (two) times daily. Mix with equal amounts of hydrocortisone cream  . omeprazole (PRILOSEC) 20 MG capsule Take 20 mg by mouth daily as needed (reflex).   . venlafaxine XR (EFFEXOR-XR) 37.5 MG 24 hr capsule TAKE 1 CAPSULE (37.5 MG TOTAL) BY MOUTH DAILY. PLEASE NOTE CHANGE BACK TO 37.5MG  SINCE 75MG  CAPSULE WAS NOT TOLERATED.   No facility-administered encounter medications on file as of 10/23/2018.      Goals Addressed            This Visit's Progress   . "I would like to manage my depression better" (pt-stated)       Phone call to patient today. Patient discussed  multiple stressors related to issues with family and friends affecting her ability to function fully. Patient discussed difficulty in establishing and maintaining  Boundaries. Placing focus on herself and her well being emphasized.   Current Barriers:  [name] Mental Health Concerns   Clinical Social Work Clinical Goal(s):  [name] Over the next 90 days, client will work with SW to address concerns related to depression  Interventions: . Patient interviewed and appropriate assessments performed . Continued to provide mental health counseling with regard to patient's symptoms of depression and common triggers . Processed patient's feelings regarding recent stressful events that have occurred in her family and with outside friendships . Processed with patient the effect of lack of boundaries has on her overall functioning . Continued to reinforce self care and the importance of establishing strong boundaries with family and friends . Continued to explored patient's cognitive messages that reinforce symptoms of depression offering alternative perspectives . Emotional support provided, reinforcing positive current coping strategies used to decrease depression and anxiety  . Discussed plans with patient for ongoing care management follow up and provided patient with direct contact information for care management team  Patient Self Care Activities:  . Performs ADL's independently . Performs IADL's independently . Calls provider office for new concerns or questions  Please see past updates related to this goal by clicking on the "Past Updates" button in the selected goal          Follow Up Plan:  Appointment scheduled for SW follow up with client by phone on: 11/09/18    Elliot Gurney, [place] Worker  [facility] [phone]

## 2018-10-23 NOTE — Patient Instructions (Signed)
Thank you allowing the Chronic Care Management Team to be a part of your care! It was a pleasure speaking with you today!  1. Please continue to utilize self care strategies discussed today 2. Please call this social worker with any questions or concerns regarding your mental health needs.  CCM (Chronic Care Management) Team   E. Thressa Sheller RN, BSN Nurse Care Coordinator  [phone]  Ruben Reason PharmD  Clinical Pharmacist  [phone]   Elliot Gurney, LCSW Clinical Social Worker [phone]  Goals Addressed            This Visit's Progress   . "I would like to manage my depression better" (pt-stated)       Current Barriers:  [name] Mental Health Concerns   Clinical Social Work Clinical Goal(s):  [name] Over the next 90 days, client will work with SW to address concerns related to depression  Interventions: . Patient interviewed and appropriate assessments performed . Continued to provide mental health counseling with regard to patient's symptoms of depression and common triggers . Processed patient's feelings regarding recent stressful events that have occurred in her family and with outside friendships . Processed with patient the effect of lack of boundaries has on her overall functioning . Continued to reinforce self care and the importance of establishing strong boundaries with family and friends . Continued to explored patient's cognitive messages that reinforce symptoms of depression offering alternative perspectives . Emotional support provided, reinforcing positive current coping strategies used to decrease depression and anxiety  . Discussed plans with patient for ongoing care management follow up and provided patient with direct contact information for care management team  Patient Self Care Activities:  . Performs ADL's independently . Performs IADL's independently . Calls provider office for new concerns or questions  Please see past updates related to  this goal by clicking on the "Past Updates" button in the selected goal          The patient verbalized understanding of instructions provided today and declined a print copy of patient instruction materials.   Telephone follow up appointment with care management team member scheduled for:11/09/18

## 2018-10-26 ENCOUNTER — Other Ambulatory Visit: Payer: Self-pay | Admitting: Family Medicine

## 2018-10-27 NOTE — Telephone Encounter (Signed)
L.O.V. was 10/05/2018, please advise.

## 2018-11-09 ENCOUNTER — Ambulatory Visit: Payer: Self-pay | Admitting: *Deleted

## 2018-11-09 DIAGNOSIS — F329 Major depressive disorder, single episode, unspecified: Secondary | ICD-10-CM | POA: Diagnosis not present

## 2018-11-09 DIAGNOSIS — F419 Anxiety disorder, unspecified: Secondary | ICD-10-CM

## 2018-11-09 NOTE — Chronic Care Management (AMB) (Signed)
Chronic Care Management    Clinical Social Work Follow Up Note  11/09/2018 Name: [name] MRN: [id number] DOB: 04/26/1945  [name] is a 73 y.o. year old female who is a primary care patient of [name], [name], MD. The CCM team was consulted for assistance with Mental Health Counseling and Resources.   Review of patient status, including review of consultants reports, other relevant assessments, and collaboration with appropriate care team members and the patient's provider was performed as part of comprehensive patient evaluation and provision of chronic care management services.     Advanced Directives Status: <no information> See Care Plan for related entries.   Outpatient Encounter Medications as of 11/09/2018  Medication Sig  . ALPRAZolam (XANAX) 0.5 MG tablet TAKE 1 TABLET (0.5 MG TOTAL) BY MOUTH 3 (THREE) TIMES DAILY AS NEEDED FOR ANXIETY.  [name] aspirin EC 81 MG tablet Take 81 mg by mouth daily.  [name] atorvastatin (LIPITOR) 80 MG tablet Take 1 tablet (80 mg total) by mouth daily.  . fluticasone (FLONASE) 50 MCG/ACT nasal spray Place 2 sprays into both nostrils daily.  [name] HYDROcodone-acetaminophen (NORCO/VICODIN) 5-325 MG tablet Take 1 tablet by mouth every 8 (eight) hours as needed for moderate pain. (Patient not taking: Reported on 09/29/2018)  . ibuprofen (ADVIL) 800 MG tablet ONE TAB DAILY AS NEEDED. TRY TO MINIMIZE USE TO SEVERE PAIN.  [name] nystatin cream (MYCOSTATIN) Apply 1 application topically 2 (two) times daily. Mix with equal amounts of hydrocortisone cream  . omeprazole (PRILOSEC) 20 MG capsule Take 20 mg by mouth daily as needed (reflex).   . venlafaxine XR (EFFEXOR-XR) 37.5 MG 24 hr capsule TAKE 1 CAPSULE (37.5 MG TOTAL) BY MOUTH DAILY. PLEASE NOTE CHANGE BACK TO 37.5MG  SINCE 75MG  CAPSULE WAS NOT TOLERATED.   No facility-administered encounter medications on file as of 11/09/2018.      Goals Addressed            This Visit's Progress   . "I would like to manage  my depression better" (pt-stated)       Phone call to patient today. Patient continued to verbalize symptoms of depression and anxiety. Patient discussed having pain in her anal area but fears medical follow up due to what may result.  Per patient, this may be triggering her depression. This Education officer, museum strongly encouraged medical follow up.  Current Barriers:  [name] Mental Health Concerns   Clinical Social Work Clinical Goal(s):  [name] Over the next 90 days, client will work with SW to address concerns related to depression  Interventions: . Patient interviewed and appropriate assessments performed . Continued to provide mental health counseling with regard to patient's symptoms of depression and common triggers . Continued to process patient's feelings regarding recent stressful events that have occurred in her family and with outside friendships . Processed with patient the effect of lack of boundaries has on her overall functioning . Continued to reinforce self care and the importance of establishing strong boundaries with family and friends . Emotional support provided, reinforcing positive current coping strategies used to decrease depression and anxiety  . Discussed plans with patient for ongoing care management follow up and provided patient with direct contact information for care management team  Patient Self Care Activities:  . Performs ADL's independently . Performs IADL's independently . Calls provider office for new concerns or questions  Please see past updates related to this goal by clicking on the "Past Updates" button in the selected goal  Follow Up Plan: Appointment scheduled for SW follow up with client by phone on: 11/25/18    Elliot Gurney, [place] Worker  [facility] [phone]

## 2018-11-09 NOTE — Patient Instructions (Signed)
Thank you allowing the Chronic Care Management Team to be a part of your care! It was a pleasure speaking with you today!  1. Please call this social worker with any questions or concerns in regards to your mental health needs 2. Please continue to practice self care   CCM (Chronic Care Management) Team     Ruben Reason PharmD  Clinical Pharmacist  [phone]   [place], [place] Social Worker [phone]  Goals Addressed            This Visit's Progress   . "I would like to manage my depression better" (pt-stated)       Current Barriers:  [name] Mental Health Concerns   Clinical Social Work Clinical Goal(s):  [name] Over the next 90 days, client will work with SW to address concerns related to depression  Interventions: . Patient interviewed and appropriate assessments performed . Continued to provide mental health counseling with regard to patient's symptoms of depression and common triggers . Continued to process patient's feelings regarding recent stressful events that have occurred in her family and with outside friendships . Processed with patient the effect of lack of boundaries has on her overall functioning . Continued to reinforce self care and the importance of establishing strong boundaries with family and friends . Emotional support provided, reinforcing positive current coping strategies used to decrease depression and anxiety  . Discussed plans with patient for ongoing care management follow up and provided patient with direct contact information for care management team  Patient Self Care Activities:  . Performs ADL's independently . Performs IADL's independently . Calls provider office for new concerns or questions  Please see past updates related to this goal by clicking on the "Past Updates" button in the selected goal          The patient verbalized understanding of instructions provided today and declined a print copy of patient  instruction materials.   Telephone follow up appointment with care management team member scheduled for:11/25/18

## 2018-11-10 ENCOUNTER — Other Ambulatory Visit: Payer: Self-pay | Admitting: Family Medicine

## 2018-11-10 DIAGNOSIS — G8929 Other chronic pain: Secondary | ICD-10-CM

## 2018-11-25 ENCOUNTER — Ambulatory Visit: Payer: Self-pay | Admitting: *Deleted

## 2018-11-25 DIAGNOSIS — F419 Anxiety disorder, unspecified: Secondary | ICD-10-CM

## 2018-11-25 DIAGNOSIS — F329 Major depressive disorder, single episode, unspecified: Secondary | ICD-10-CM

## 2018-11-25 NOTE — Patient Instructions (Signed)
Thank you allowing the Chronic Care Management Team to be a part of your care! It was a pleasure speaking with you today!  1. Please call this social worker with any questions or concerns regarding your mental health needs    CCM (Chronic Care Management) Team    Ruben Reason PharmD  Clinical Pharmacist  [phone]   [place], [place] Social Worker [phone]  Goals Addressed            This Visit's Progress   . "I would like to manage my depression better" (pt-stated)       Current Barriers:  [name] Mental Health Concerns   Clinical Social Work Clinical Goal(s):  [name] Over the next 90 days, client will work with SW to address concerns related to depression  Interventions: . Patient interviewed and appropriate assessments performed . Continued to provide mental health counseling with regard to patient's symptoms of depression and common triggers . Explored status of patient's mood, confirming current status "stable" . Confirmed that patient has the desire to taper off of her xanax and would like referral to a psychiatrist . Continued to reinforce self care  . Emotional support provided, reinforcing positive current coping strategies used to decrease depression and anxiety  . Encouraged follow up with a medical doctor regarding recommendation of colonoscopy testing . Discussed plans with patient for ongoing care management follow up and provided patient with direct contact information for care management team  Patient Self Care Activities:  . Performs ADL's independently . Performs IADL's independently . Calls provider office for new concerns or questions  Please see past updates related to this goal by clicking on the "Past Updates" button in the selected goal          The patient verbalized understanding of instructions provided today and declined a print copy of patient instruction materials.   The care management team will reach out to the patient  again over the next 14 days. with potential referrals for psychiatrist

## 2018-11-25 NOTE — Chronic Care Management (AMB) (Signed)
  Care Management    Clinical Social Work General Follow Up Note  11/25/2018 Name: [name] MRN: [id number] DOB: 1945-10-26  [name] is a 73 y.o. year old female who is a primary care patient of [name], [name], MD. The CCM team was consulted for assistance with Mental Health Counseling and Resources.   Review of patient status, including review of consultants reports, relevant laboratory and other test results, and collaboration with appropriate care team members and the patient's provider was performed as part of comprehensive patient evaluation and provision of chronic care management services.    Advanced Directives Status: <no information> See Care Plan for related entries.   Outpatient Encounter Medications as of 11/25/2018  Medication Sig  . ALPRAZolam (XANAX) 0.5 MG tablet TAKE 1 TABLET (0.5 MG TOTAL) BY MOUTH 3 (THREE) TIMES DAILY AS NEEDED FOR ANXIETY.  [name] aspirin EC 81 MG tablet Take 81 mg by mouth daily.  [name] atorvastatin (LIPITOR) 80 MG tablet Take 1 tablet (80 mg total) by mouth daily.  . fluticasone (FLONASE) 50 MCG/ACT nasal spray Place 2 sprays into both nostrils daily.  [name] HYDROcodone-acetaminophen (NORCO/VICODIN) 5-325 MG tablet Take 1 tablet by mouth every 8 (eight) hours as needed for moderate pain. (Patient not taking: Reported on 09/29/2018)  . ibuprofen (ADVIL) 800 MG tablet ONE TAB DAILY AS NEEDED. TRY TO MINIMIZE USE TO SEVERE PAIN.  [name] nystatin cream (MYCOSTATIN) Apply 1 application topically 2 (two) times daily. Mix with equal amounts of hydrocortisone cream  . omeprazole (PRILOSEC) 20 MG capsule Take 20 mg by mouth daily as needed (reflex).   . venlafaxine XR (EFFEXOR-XR) 37.5 MG 24 hr capsule TAKE 1 CAPSULE (37.5 MG TOTAL) BY MOUTH DAILY. PLEASE NOTE CHANGE BACK TO 37.5MG  SINCE 75MG  CAPSULE WAS NOT TOLERATED.   No facility-administered encounter medications on file as of 11/25/2018.     Goals Addressed            This Visit's Progress   . "I would  like to manage my depression better" (pt-stated)       Current Barriers:  [name] Mental Health Concerns   Clinical Social Work Clinical Goal(s):  [name] Over the next 90 days, client will work with SW to address concerns related to depression  Interventions: . Patient interviewed and appropriate assessments performed . Continued to provide mental health counseling with regard to patient's symptoms of depression and common triggers . Explored status of patient's mood, confirming current status "stable" . Confirmed that patient has the desire to taper off of her xanax and would like referral to a psychiatrist . Continued to reinforce self care  . Emotional support provided, reinforcing positive current coping strategies used to decrease depression and anxiety  . Encouraged follow up with a medical doctor regarding recommendation of colonoscopy testing . Discussed plans with patient for ongoing care management follow up and provided patient with direct contact information for care management team  Patient Self Care Activities:  . Performs ADL's independently . Performs IADL's independently . Calls provider office for new concerns or questions  Please see past updates related to this goal by clicking on the "Past Updates" button in the selected goal           Follow Up Plan: SW will follow up with patient by phone over the next 2 weeks     [place], [place] Worker  [facility] [phone]

## 2018-11-27 ENCOUNTER — Ambulatory Visit (INDEPENDENT_AMBULATORY_CARE_PROVIDER_SITE_OTHER): Payer: Medicare Other | Admitting: *Deleted

## 2018-11-27 DIAGNOSIS — F329 Major depressive disorder, single episode, unspecified: Secondary | ICD-10-CM | POA: Diagnosis not present

## 2018-11-27 DIAGNOSIS — F419 Anxiety disorder, unspecified: Secondary | ICD-10-CM

## 2018-11-27 NOTE — Patient Instructions (Signed)
Thank you allowing the Chronic Care Management Team to be a part of your care! It was a pleasure speaking with you today!  1. Please contact the local mental health providers listed to schedule initial assessment appointment   CCM (Chronic Care Management) Team   Neldon Labella RN, BSN Nurse Care Coordinator  [phone]  Ruben Reason PharmD  Clinical Pharmacist  [phone]   [place], LCSW Clinical Social Worker [phone]  Goals Addressed            This Visit's Progress   . "I would like to manage my depression better" (pt-stated)       Current Barriers:  [name] Mental Health Concerns   Clinical Social Work Clinical Goal(s):  [name] Over the next 90 days, client will work with SW to address concerns related to depression  Interventions: . Provided patient with referral options for local psychiatrist to further manage her anxiety . Patient provided with the contact information for the [facility] [phone] and [facility] [phone] . Patient encouraged to contact the agencies provided to discuss services and appointment availabilities . Discussed plans with patient for ongoing care management follow up and provided patient with direct contact information for care management team  Patient Self Care Activities:  . Performs ADL's independently . Performs IADL's independently . Calls provider office for new concerns or questions  Please see past updates related to this goal by clicking on the "Past Updates" button in the selected goal          The patient verbalized understanding of instructions provided today and declined a print copy of patient instruction materials.   Telephone follow up appointment with care management team member scheduled for:12/09/18

## 2018-11-27 NOTE — Chronic Care Management (AMB) (Signed)
  Chronic Care Management    Clinical Social Work Follow Up Note  11/27/2018 Name: [name] MRN: [id number] DOB: [birth year]  [name] is a 73 y.o. year old female who is a primary care patient of [name], [name], MD. The CCM team was consulted for assistance with Mental Health Counseling and Resources.   Review of patient status, including review of consultants reports, other relevant assessments, and collaboration with appropriate care team members and the patient's provider was performed as part of comprehensive patient evaluation and provision of chronic care management services.     Advanced Directives Status: <no information> See Care Plan for related entries.   Outpatient Encounter Medications as of 11/27/2018  Medication Sig  . ALPRAZolam (XANAX) 0.5 MG tablet TAKE 1 TABLET (0.5 MG TOTAL) BY MOUTH 3 (THREE) TIMES DAILY AS NEEDED FOR ANXIETY.  [name] aspirin EC 81 MG tablet Take 81 mg by mouth daily.  [name] atorvastatin (LIPITOR) 80 MG tablet Take 1 tablet (80 mg total) by mouth daily.  . fluticasone (FLONASE) 50 MCG/ACT nasal spray Place 2 sprays into both nostrils daily.  [name] HYDROcodone-acetaminophen (NORCO/VICODIN) 5-325 MG tablet Take 1 tablet by mouth every 8 (eight) hours as needed for moderate pain. (Patient not taking: Reported on 09/29/2018)  . ibuprofen (ADVIL) 800 MG tablet ONE TAB DAILY AS NEEDED. TRY TO MINIMIZE USE TO SEVERE PAIN.  [name] nystatin cream (MYCOSTATIN) Apply 1 application topically 2 (two) times daily. Mix with equal amounts of hydrocortisone cream  . omeprazole (PRILOSEC) 20 MG capsule Take 20 mg by mouth daily as needed (reflex).   . venlafaxine XR (EFFEXOR-XR) 37.5 MG 24 hr capsule TAKE 1 CAPSULE (37.5 MG TOTAL) BY MOUTH DAILY. PLEASE NOTE CHANGE BACK TO 37.5MG  SINCE 75MG  CAPSULE WAS NOT TOLERATED.   No facility-administered encounter medications on file as of 11/27/2018.      Goals Addressed            This Visit's Progress   . "I would like to manage  my depression better" (pt-stated)       Current Barriers:  [name] Mental Health Concerns   Clinical Social Work Clinical Goal(s):  [name] Over the next 90 days, client will work with SW to address concerns related to depression  Interventions: . Provided patient with referral options for local psychiatrist to further manage her anxiety . Patient provided with the contact information for the [facility] [phone] and [facility] [phone] . Patient encouraged to contact the agencies provided to discuss services and appointment availabilities . Discussed plans with patient for ongoing care management follow up and provided patient with direct contact information for care management team  Patient Self Care Activities:  . Performs ADL's independently . Performs IADL's independently . Calls provider office for new concerns or questions  Please see past updates related to this goal by clicking on the "Past Updates" button in the selected goal          Follow Up Plan: SW will follow up with patient by phone over the next 2 weeks    [place], [place] Worker  [facility] Care Management [phone]

## 2018-12-09 ENCOUNTER — Telehealth: Payer: Self-pay

## 2018-12-10 ENCOUNTER — Other Ambulatory Visit: Payer: Self-pay

## 2018-12-10 DIAGNOSIS — Z20822 Contact with and (suspected) exposure to covid-19: Secondary | ICD-10-CM

## 2018-12-12 LAB — NOVEL CORONAVIRUS, NAA: SARS-CoV-2, NAA: NOT DETECTED

## 2018-12-14 ENCOUNTER — Telehealth: Payer: Self-pay | Admitting: Family Medicine

## 2018-12-14 ENCOUNTER — Telehealth: Payer: Self-pay

## 2018-12-14 NOTE — Telephone Encounter (Signed)
I called patient to get more information. She states she doesn't like how the Effexor is making her feel. She says  the Effexor is not helping to control her depression, and she feels that her depression has worsened since taking Effexor. Patient wants change back to taking Citalopram. Please advise.

## 2018-12-14 NOTE — Telephone Encounter (Signed)
Pt called saying she is taking the generic Effexor and wants to know if she can be put  back on the other medication that she was taking for depression.  She is actually trying to get off all the medication for depression.  She can't come in right now because she is waiting for covid results  [phone]  Con [place]

## 2018-12-15 ENCOUNTER — Telehealth: Payer: Self-pay | Admitting: *Deleted

## 2018-12-15 MED ORDER — CITALOPRAM HYDROBROMIDE 10 MG PO TABS: 10.0000 mg | ORAL_TABLET | Freq: Every day | ORAL | 4 refills | Status: DC

## 2018-12-15 NOTE — Telephone Encounter (Signed)
OK, have sent prescription for citalopram to take in place of Effexor.

## 2018-12-15 NOTE — Telephone Encounter (Signed)
Patient advised.

## 2018-12-16 ENCOUNTER — Ambulatory Visit: Payer: Self-pay | Admitting: *Deleted

## 2018-12-16 DIAGNOSIS — F419 Anxiety disorder, unspecified: Secondary | ICD-10-CM

## 2018-12-16 DIAGNOSIS — F329 Major depressive disorder, single episode, unspecified: Secondary | ICD-10-CM

## 2018-12-16 NOTE — Chronic Care Management (AMB) (Signed)
  Chronic Care Management    Clinical Social Work Follow Up Note  12/16/2018 Name: [name] MRN: [id number] DOB: August 01, 1945  [name] is a 73 y.o. year old female who is a primary care patient of [name], [name], MD. The CCM team was consulted for assistance with Mental Health Counseling and Resources.   Review of patient status, including review of consultants reports, other relevant assessments, and collaboration with appropriate care team members and the patient's provider was performed as part of comprehensive patient evaluation and provision of chronic care management services.    Advanced Directives Status: <no information> See Care Plan for related entries.   Outpatient Encounter Medications as of 12/16/2018  Medication Sig  . ALPRAZolam (XANAX) 0.5 MG tablet TAKE 1 TABLET (0.5 MG TOTAL) BY MOUTH 3 (THREE) TIMES DAILY AS NEEDED FOR ANXIETY.  [name] aspirin EC 81 MG tablet Take 81 mg by mouth daily.  [name] atorvastatin (LIPITOR) 80 MG tablet Take 1 tablet (80 mg total) by mouth daily.  . citalopram (CELEXA) 10 MG tablet Take 1 tablet (10 mg total) by mouth daily.  . fluticasone (FLONASE) 50 MCG/ACT nasal spray Place 2 sprays into both nostrils daily.  [name] HYDROcodone-acetaminophen (NORCO/VICODIN) 5-325 MG tablet Take 1 tablet by mouth every 8 (eight) hours as needed for moderate pain. (Patient not taking: Reported on 09/29/2018)  . ibuprofen (ADVIL) 800 MG tablet ONE TAB DAILY AS NEEDED. TRY TO MINIMIZE USE TO SEVERE PAIN.  [name] nystatin cream (MYCOSTATIN) Apply 1 application topically 2 (two) times daily. Mix with equal amounts of hydrocortisone cream  . omeprazole (PRILOSEC) 20 MG capsule Take 20 mg by mouth daily as needed (reflex).    No facility-administered encounter medications on file as of 12/16/2018.      Goals Addressed            This Visit's Progress   . "I would like to manage my depression better" (pt-stated)       Current Barriers:  [name] Mental Health Concerns    Clinical Social Work Clinical Goal(s):  [name] Over the next 90 days, client will work with SW to address concerns related to depression  Interventions: . Provided patient with referral options for local psychiatrist to further manage her anxiety . Patient states that she has been contacted from the  [facility] [phone], she missed the call and has requested a call back . Discussed coping strategies to stress in addition to medication management including meditation and possibly yoga . Discussed plans with patient for ongoing care management follow up and provided patient with direct contact information for care management team  Patient Self Care Activities:  . Performs ADL's independently . Performs IADL's independently . Calls provider office for new concerns or questions  Please see past updates related to this goal by clicking on the "Past Updates" button in the selected goal          Follow Up Plan: SW will follow up with patient by phone over the next 2 weeks   [place], [place] Worker  [facility] Care Management [phone]

## 2018-12-16 NOTE — Patient Instructions (Signed)
Thank you allowing the Chronic Care Management Team to be a part of your care! It was a pleasure speaking with you today!  1. Please contact the [facility] to schedule your initial appointment for medication management. 2. Please call this social worker with any questions or concerns regarding your mental health needs.   CCM (Chronic Care Management) Team   Neldon Labella, RN, BSN Nurse Care Coordinator  [phone]  Ruben Reason PharmD  Clinical Pharmacist  [phone]   Elliot Gurney, LCSW Clinical Social Worker [phone]  Goals Addressed            This Visit's Progress   . "I would like to manage my depression better" (pt-stated)       Current Barriers:  [name] Mental Health Concerns   Clinical Social Work Clinical Goal(s):  [name] Over the next 90 days, client will work with SW to address concerns related to depression  Interventions: . Provided patient with referral options for local psychiatrist to further manage her anxiety . Patient states that she has been contacted from the  [facility] [phone], she missed the call and has requested a call back . Discussed coping strategies to stress in addition to medication management including meditation and possibly yoga . Discussed plans with patient for ongoing care management follow up and provided patient with direct contact information for care management team  Patient Self Care Activities:  . Performs ADL's independently . Performs IADL's independently . Calls provider office for new concerns or questions  Please see past updates related to this goal by clicking on the "Past Updates" button in the selected goal          The patient verbalized understanding of instructions provided today and declined a print copy of patient instruction materials.   Telephone follow up appointment with care management team member scheduled for:12/30/18

## 2018-12-17 ENCOUNTER — Emergency Department
Admission: EM | Admit: 2018-12-17 | Discharge: 2018-12-17 | Disposition: A | Discharge status: Home / Self Care | Payer: Medicare Other | Source: Home / Self Care | Attending: Emergency Medicine | Admitting: Emergency Medicine

## 2018-12-17 ENCOUNTER — Emergency Department: Payer: Medicare Other

## 2018-12-17 ENCOUNTER — Other Ambulatory Visit: Payer: Self-pay

## 2018-12-17 DIAGNOSIS — R079 Chest pain, unspecified: Secondary | ICD-10-CM | POA: Diagnosis not present

## 2018-12-17 DIAGNOSIS — I10 Essential (primary) hypertension: Secondary | ICD-10-CM | POA: Insufficient documentation

## 2018-12-17 DIAGNOSIS — M542 Cervicalgia: Secondary | ICD-10-CM | POA: Insufficient documentation

## 2018-12-17 DIAGNOSIS — R0789 Other chest pain: Secondary | ICD-10-CM | POA: Insufficient documentation

## 2018-12-17 DIAGNOSIS — Y999 Unspecified external cause status: Secondary | ICD-10-CM | POA: Insufficient documentation

## 2018-12-17 DIAGNOSIS — Y93I9 Activity, other involving external motion: Secondary | ICD-10-CM | POA: Insufficient documentation

## 2018-12-17 DIAGNOSIS — K56609 Unspecified intestinal obstruction, unspecified as to partial versus complete obstruction: Secondary | ICD-10-CM | POA: Diagnosis not present

## 2018-12-17 DIAGNOSIS — Z888 Allergy status to other drugs, medicaments and biological substances status: Secondary | ICD-10-CM | POA: Diagnosis not present

## 2018-12-17 DIAGNOSIS — Z820 Family history of epilepsy and other diseases of the nervous system: Secondary | ICD-10-CM | POA: Diagnosis not present

## 2018-12-17 DIAGNOSIS — S299XXA Unspecified injury of thorax, initial encounter: Secondary | ICD-10-CM | POA: Diagnosis not present

## 2018-12-17 DIAGNOSIS — E785 Hyperlipidemia, unspecified: Secondary | ICD-10-CM | POA: Diagnosis not present

## 2018-12-17 DIAGNOSIS — R4020 Unspecified coma: Secondary | ICD-10-CM | POA: Diagnosis not present

## 2018-12-17 DIAGNOSIS — K5730 Diverticulosis of large intestine without perforation or abscess without bleeding: Secondary | ICD-10-CM | POA: Diagnosis not present

## 2018-12-17 DIAGNOSIS — Z7982 Long term (current) use of aspirin: Secondary | ICD-10-CM | POA: Insufficient documentation

## 2018-12-17 DIAGNOSIS — K565 Intestinal adhesions [bands], unspecified as to partial versus complete obstruction: Secondary | ICD-10-CM | POA: Diagnosis not present

## 2018-12-17 DIAGNOSIS — Z03818 Encounter for observation for suspected exposure to other biological agents ruled out: Secondary | ICD-10-CM | POA: Diagnosis not present

## 2018-12-17 DIAGNOSIS — Z4682 Encounter for fitting and adjustment of non-vascular catheter: Secondary | ICD-10-CM | POA: Diagnosis not present

## 2018-12-17 DIAGNOSIS — Z7951 Long term (current) use of inhaled steroids: Secondary | ICD-10-CM | POA: Diagnosis not present

## 2018-12-17 DIAGNOSIS — Z801 Family history of malignant neoplasm of trachea, bronchus and lung: Secondary | ICD-10-CM | POA: Diagnosis not present

## 2018-12-17 DIAGNOSIS — Y92481 Parking lot as the place of occurrence of the external cause: Secondary | ICD-10-CM | POA: Insufficient documentation

## 2018-12-17 DIAGNOSIS — Z79891 Long term (current) use of opiate analgesic: Secondary | ICD-10-CM | POA: Diagnosis not present

## 2018-12-17 DIAGNOSIS — Z8249 Family history of ischemic heart disease and other diseases of the circulatory system: Secondary | ICD-10-CM | POA: Diagnosis not present

## 2018-12-17 DIAGNOSIS — R1084 Generalized abdominal pain: Secondary | ICD-10-CM | POA: Diagnosis not present

## 2018-12-17 DIAGNOSIS — Z880 Allergy status to penicillin: Secondary | ICD-10-CM | POA: Diagnosis not present

## 2018-12-17 DIAGNOSIS — M1990 Unspecified osteoarthritis, unspecified site: Secondary | ICD-10-CM | POA: Diagnosis not present

## 2018-12-17 DIAGNOSIS — Z881 Allergy status to other antibiotic agents status: Secondary | ICD-10-CM | POA: Diagnosis not present

## 2018-12-17 DIAGNOSIS — Z8673 Personal history of transient ischemic attack (TIA), and cerebral infarction without residual deficits: Secondary | ICD-10-CM | POA: Diagnosis not present

## 2018-12-17 DIAGNOSIS — Z743 Need for continuous supervision: Secondary | ICD-10-CM | POA: Diagnosis not present

## 2018-12-17 DIAGNOSIS — R109 Unspecified abdominal pain: Secondary | ICD-10-CM | POA: Diagnosis not present

## 2018-12-17 DIAGNOSIS — K219 Gastro-esophageal reflux disease without esophagitis: Secondary | ICD-10-CM | POA: Diagnosis not present

## 2018-12-17 DIAGNOSIS — R202 Paresthesia of skin: Secondary | ICD-10-CM | POA: Diagnosis not present

## 2018-12-17 DIAGNOSIS — S199XXA Unspecified injury of neck, initial encounter: Secondary | ICD-10-CM | POA: Diagnosis not present

## 2018-12-17 DIAGNOSIS — Z833 Family history of diabetes mellitus: Secondary | ICD-10-CM | POA: Diagnosis not present

## 2018-12-17 DIAGNOSIS — K5989 Other specified functional intestinal disorders: Secondary | ICD-10-CM | POA: Diagnosis not present

## 2018-12-17 DIAGNOSIS — Z79899 Other long term (current) drug therapy: Secondary | ICD-10-CM | POA: Diagnosis not present

## 2018-12-17 MED ORDER — ALPRAZOLAM 0.5 MG PO TABS
0.5000 mg | ORAL_TABLET | Freq: Once | ORAL | Status: AC
  Administered 2018-12-17: 0.5 mg via ORAL
  Filled 2018-12-17: qty 1

## 2018-12-17 MED ORDER — ALPRAZOLAM 0.5 MG PO TABS
0.5000 mg | ORAL_TABLET | Freq: Once | ORAL | Status: DC
  Filled 2018-12-17: qty 1

## 2018-12-17 MED ORDER — METHOCARBAMOL 500 MG PO TABS: 500.0000 mg | ORAL_TABLET | Freq: Three times a day (TID) | ORAL | 0 refills | Status: AC | PRN

## 2018-12-17 MED ORDER — ACETAMINOPHEN 325 MG PO TABS
650.0000 mg | ORAL_TABLET | Freq: Once | ORAL | Status: AC
  Administered 2018-12-17: 650 mg via ORAL
  Filled 2018-12-17: qty 2
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
## 2018-12-17 NOTE — ED Provider Notes (Signed)
-----------------------------------------   9:34 PM on 12/17/2018 -----------------------------------------  EKG viewed and interpreted by myself shows a normal sinus rhythm at 67 bpm with a narrow QRS, normal axis, normal intervals, no concerning ST changes.  Reassuring EKG.   Harvest Dark, MD 12/17/18 2134

## 2018-12-19 ENCOUNTER — Other Ambulatory Visit: Payer: Self-pay

## 2018-12-19 ENCOUNTER — Inpatient Hospital Stay: Payer: Medicare Other

## 2018-12-19 ENCOUNTER — Emergency Department: Payer: Medicare Other

## 2018-12-19 ENCOUNTER — Inpatient Hospital Stay
Admission: EM | Admit: 2018-12-19 | Discharge: 2018-12-22 | DRG: 390 | Disposition: A | Discharge status: Home / Self Care | Payer: Medicare Other | Attending: Surgery | Admitting: Surgery

## 2018-12-19 DIAGNOSIS — Z8673 Personal history of transient ischemic attack (TIA), and cerebral infarction without residual deficits: Secondary | ICD-10-CM | POA: Diagnosis not present

## 2018-12-19 DIAGNOSIS — Z888 Allergy status to other drugs, medicaments and biological substances status: Secondary | ICD-10-CM | POA: Diagnosis not present

## 2018-12-19 DIAGNOSIS — Z7982 Long term (current) use of aspirin: Secondary | ICD-10-CM | POA: Diagnosis not present

## 2018-12-19 DIAGNOSIS — Z90710 Acquired absence of both cervix and uterus: Secondary | ICD-10-CM

## 2018-12-19 DIAGNOSIS — K565 Intestinal adhesions [bands], unspecified as to partial versus complete obstruction: Secondary | ICD-10-CM | POA: Diagnosis present

## 2018-12-19 DIAGNOSIS — Z813 Family history of other psychoactive substance abuse and dependence: Secondary | ICD-10-CM

## 2018-12-19 DIAGNOSIS — Z20828 Contact with and (suspected) exposure to other viral communicable diseases: Secondary | ICD-10-CM | POA: Diagnosis present

## 2018-12-19 DIAGNOSIS — R1084 Generalized abdominal pain: Secondary | ICD-10-CM

## 2018-12-19 DIAGNOSIS — Z79899 Other long term (current) drug therapy: Secondary | ICD-10-CM

## 2018-12-19 DIAGNOSIS — Z818 Family history of other mental and behavioral disorders: Secondary | ICD-10-CM

## 2018-12-19 DIAGNOSIS — E785 Hyperlipidemia, unspecified: Secondary | ICD-10-CM | POA: Diagnosis present

## 2018-12-19 DIAGNOSIS — Z7951 Long term (current) use of inhaled steroids: Secondary | ICD-10-CM

## 2018-12-19 DIAGNOSIS — Z79891 Long term (current) use of opiate analgesic: Secondary | ICD-10-CM | POA: Diagnosis not present

## 2018-12-19 DIAGNOSIS — I10 Essential (primary) hypertension: Secondary | ICD-10-CM | POA: Diagnosis present

## 2018-12-19 DIAGNOSIS — Z833 Family history of diabetes mellitus: Secondary | ICD-10-CM | POA: Diagnosis not present

## 2018-12-19 DIAGNOSIS — M1990 Unspecified osteoarthritis, unspecified site: Secondary | ICD-10-CM | POA: Diagnosis present

## 2018-12-19 DIAGNOSIS — Z801 Family history of malignant neoplasm of trachea, bronchus and lung: Secondary | ICD-10-CM | POA: Diagnosis not present

## 2018-12-19 DIAGNOSIS — Y92410 Unspecified street and highway as the place of occurrence of the external cause: Secondary | ICD-10-CM | POA: Diagnosis not present

## 2018-12-19 DIAGNOSIS — K56609 Unspecified intestinal obstruction, unspecified as to partial versus complete obstruction: Secondary | ICD-10-CM | POA: Diagnosis present

## 2018-12-19 DIAGNOSIS — Z880 Allergy status to penicillin: Secondary | ICD-10-CM

## 2018-12-19 DIAGNOSIS — F419 Anxiety disorder, unspecified: Secondary | ICD-10-CM | POA: Diagnosis present

## 2018-12-19 DIAGNOSIS — Z881 Allergy status to other antibiotic agents status: Secondary | ICD-10-CM

## 2018-12-19 DIAGNOSIS — Z820 Family history of epilepsy and other diseases of the nervous system: Secondary | ICD-10-CM | POA: Diagnosis not present

## 2018-12-19 DIAGNOSIS — R079 Chest pain, unspecified: Secondary | ICD-10-CM | POA: Diagnosis present

## 2018-12-19 DIAGNOSIS — K219 Gastro-esophageal reflux disease without esophagitis: Secondary | ICD-10-CM | POA: Diagnosis present

## 2018-12-19 DIAGNOSIS — Z8249 Family history of ischemic heart disease and other diseases of the circulatory system: Secondary | ICD-10-CM | POA: Diagnosis not present

## 2018-12-19 LAB — CBC
HCT: 45.3 % (ref 36.0–46.0)
Hemoglobin: 15.8 g/dL — ABNORMAL HIGH (ref 12.0–15.0)
MCH: 30.7 pg (ref 26.0–34.0)
MCHC: 34.9 g/dL (ref 30.0–36.0)
MCV: 88 fL (ref 80.0–100.0)
Platelets: 266 10*3/uL (ref 150–400)
RBC: 5.15 MIL/uL — ABNORMAL HIGH (ref 3.87–5.11)
RDW: 12.5 % (ref 11.5–15.5)
WBC: 7.2 10*3/uL (ref 4.0–10.5)
nRBC: 0 % (ref 0.0–0.2)

## 2018-12-19 LAB — COMPREHENSIVE METABOLIC PANEL
ALT: 18 U/L (ref 0–44)
AST: 27 U/L (ref 15–41)
Albumin: 4.3 g/dL (ref 3.5–5.0)
Alkaline Phosphatase: 55 U/L (ref 38–126)
Anion gap: 10 (ref 5–15)
BUN: 16 mg/dL (ref 8–23)
CO2: 21 mmol/L — ABNORMAL LOW (ref 22–32)
Calcium: 9.3 mg/dL (ref 8.9–10.3)
Chloride: 104 mmol/L (ref 98–111)
Creatinine, Ser: 0.76 mg/dL (ref 0.44–1.00)
GFR calc Af Amer: >60 mL/min (ref >60)
GFR calc non Af Amer: >60 mL/min (ref >60)
Glucose, Bld: 148 mg/dL — ABNORMAL HIGH (ref 70–99)
Potassium: 4.3 mmol/L (ref 3.5–5.1)
Sodium: 135 mmol/L (ref 135–145)
Total Bilirubin: 1.4 mg/dL — ABNORMAL HIGH (ref 0.3–1.2)
Total Protein: 8 g/dL (ref 6.5–8.1)

## 2018-12-19 LAB — URINALYSIS, COMPLETE (UACMP) WITH MICROSCOPIC
Bacteria, UA: NONE SEEN
Bilirubin Urine: NEGATIVE
Glucose, UA: 50 mg/dL — AB
Hgb urine dipstick: NEGATIVE
Ketones, ur: 5 mg/dL — AB
Leukocytes,Ua: NEGATIVE
Nitrite: NEGATIVE
Protein, ur: 100 mg/dL — AB
Specific Gravity, Urine: 1.021 (ref 1.005–1.030)
pH: 8 (ref 5.0–8.0)

## 2018-12-19 LAB — SARS CORONAVIRUS 2 BY RT PCR (HOSPITAL ORDER, PERFORMED IN ~~LOC~~ HOSPITAL LAB): SARS Coronavirus 2: NEGATIVE

## 2018-12-19 LAB — LIPASE, BLOOD: Lipase: 21 U/L (ref 11–51)

## 2018-12-19 MED ORDER — LACTATED RINGERS IV SOLN
INTRAVENOUS | Status: DC
  Administered 2018-12-20 – 2018-12-21 (×3): via INTRAVENOUS

## 2018-12-19 MED ORDER — SODIUM CHLORIDE 0.9% FLUSH: 3.0000 mL | Freq: Once | INTRAVENOUS | Status: DC

## 2018-12-19 MED ORDER — MORPHINE SULFATE (PF) 2 MG/ML IV SOLN
1.0000 mg | INTRAVENOUS | Status: DC | PRN
  Administered 2018-12-19 – 2018-12-20 (×5): 1 mg via INTRAVENOUS
  Filled 2018-12-19 (×7): qty 1

## 2018-12-19 MED ORDER — NYSTATIN 100000 UNIT/GM EX CREA
1.0000 "application " | TOPICAL_CREAM | Freq: Two times a day (BID) | CUTANEOUS | Status: DC
  Administered 2018-12-20: 1 via TOPICAL
  Filled 2018-12-19 (×2): qty 15

## 2018-12-19 MED ORDER — ONDANSETRON HCL 4 MG/2ML IJ SOLN
4.0000 mg | Freq: Four times a day (QID) | INTRAMUSCULAR | Status: DC | PRN
  Administered 2018-12-19 – 2018-12-21 (×6): 4 mg via INTRAVENOUS
  Filled 2018-12-19 (×6): qty 2

## 2018-12-19 MED ORDER — ALPRAZOLAM 0.5 MG PO TABS
0.5000 mg | ORAL_TABLET | Freq: Three times a day (TID) | ORAL | Status: DC | PRN
  Administered 2018-12-20 – 2018-12-22 (×4): 0.5 mg via ORAL
  Filled 2018-12-19 (×5): qty 1

## 2018-12-19 MED ORDER — ACETAMINOPHEN 325 MG PO TABS: 650.0000 mg | ORAL_TABLET | Freq: Four times a day (QID) | ORAL | Status: DC | PRN

## 2018-12-19 MED ORDER — ONDANSETRON HCL 4 MG/2ML IJ SOLN
4.0000 mg | Freq: Once | INTRAMUSCULAR | Status: AC
  Administered 2018-12-19: 4 mg via INTRAVENOUS
  Filled 2018-12-19: qty 2

## 2018-12-19 MED ORDER — ENOXAPARIN SODIUM 40 MG/0.4ML ~~LOC~~ SOLN
40.0000 mg | SUBCUTANEOUS | Status: DC
  Administered 2018-12-21 – 2018-12-22 (×2): 40 mg via SUBCUTANEOUS
  Filled 2018-12-19 (×5): qty 0.4

## 2018-12-19 MED ORDER — IOHEXOL 9 MG/ML PO SOLN
500.0000 mL | Freq: Two times a day (BID) | ORAL | Status: DC | PRN
  Administered 2018-12-19: 500 mL via ORAL
  Filled 2018-12-19: qty 500

## 2018-12-19 MED ORDER — PANTOPRAZOLE SODIUM 40 MG PO TBEC
40.0000 mg | DELAYED_RELEASE_TABLET | Freq: Every day | ORAL | Status: DC
  Administered 2018-12-20 – 2018-12-22 (×3): 40 mg via ORAL
  Filled 2018-12-19 (×3): qty 1

## 2018-12-19 MED ORDER — MORPHINE SULFATE (PF) 4 MG/ML IV SOLN
4.0000 mg | Freq: Once | INTRAVENOUS | Status: AC
  Administered 2018-12-19: 4 mg via INTRAVENOUS
  Filled 2018-12-19: qty 1

## 2018-12-19 MED ORDER — ONDANSETRON 4 MG PO TBDP: 4.0000 mg | ORAL_TABLET | Freq: Four times a day (QID) | ORAL | Status: DC | PRN

## 2018-12-19 MED ORDER — ATORVASTATIN CALCIUM 20 MG PO TABS
80.0000 mg | ORAL_TABLET | Freq: Every day | ORAL | Status: DC
  Administered 2018-12-20: 80 mg via ORAL
  Filled 2018-12-19 (×3): qty 4

## 2018-12-19 MED ORDER — IOHEXOL 300 MG/ML  SOLN
100.0000 mL | Freq: Once | INTRAMUSCULAR | Status: AC | PRN
  Administered 2018-12-19: 100 mL via INTRAVENOUS

## 2018-12-19 MED ORDER — CITALOPRAM HYDROBROMIDE 20 MG PO TABS
10.0000 mg | ORAL_TABLET | Freq: Every day | ORAL | Status: DC
  Administered 2018-12-20 – 2018-12-22 (×3): 10 mg via ORAL
  Filled 2018-12-19 (×3): qty 1

## 2018-12-19 NOTE — ED Notes (Addendum)
Pt with NG tube to low intermittant suction. Pt tolerating well. NG tube placement verified by xray

## 2018-12-19 NOTE — ED Notes (Signed)
NG tube placed, pt with vomiting, but tolerated well. Xray ordered for placement confirmation.

## 2018-12-19 NOTE — H&P (Signed)
Subjective:   CC: Small bowel obstruction  HPI:  [name] is a 73 y.o. female who was consulted by [facility] for issue above.  Symptoms were first noted 1 day ago. Pain is sharp, confined to the epigastric area, without radiation.  Associated with nausea vomiting, exacerbated by nothing specific.  Pain similar to the episode of small bowel obstruction she had back in December.   Past Medical History:  has a past medical history of Anxiety, Arthritis, Back pain, Complication of anesthesia, GERD (gastroesophageal reflux disease), Hyperlipidemia, Hypertension, Neuromuscular disorder ([place]), Partial bowel obstruction ([place]) (02/04/2018), Stroke ([place]), Vertigo, Vitamin D deficiency, and Wears contact lenses.  Past Surgical History:  Past Surgical History:  Procedure Laterality Date  . ABDOMINAL HYSTERECTOMY  1987  . CARDIAC CATHETERIZATION  02/2007  . COLON SURGERY  03/12/2012   Dr Phylis Bougie  . COLONOSCOPY WITH PROPOFOL N/A 09/26/2014   Procedure: COLONOSCOPY WITH PROPOFOL;  Surgeon: Lucilla Lame, MD;  Location: [place];  Service: Endoscopy;  Laterality: N/A;  marker (tattoo) used in colon  . ESOPHAGEAL DILATION N/A 02/02/2016   Procedure: ESOPHAGEAL DILATION;  Surgeon: Lucilla Lame, MD;  Location: [place];  Service: Endoscopy;  Laterality: N/A;  . ESOPHAGOGASTRODUODENOSCOPY (EGD) WITH PROPOFOL N/A 02/02/2016   Procedure: ESOPHAGOGASTRODUODENOSCOPY (EGD) WITH PROPOFOL;  Surgeon: Lucilla Lame, MD;  Location: [place];  Service: Endoscopy;  Laterality: N/A;  . POLYPECTOMY  09/26/2014   Procedure: POLYPECTOMY;  Surgeon: Lucilla Lame, MD;  Location: [place];  Service: Endoscopy;;  . TUBAL LIGATION  1972    Family History: family history includes Cancer in her father; Diabetes in her mother; Drug abuse in her brother; Heart disease in her mother; Hypertension in her mother; Mental illness in her mother; Multiple sclerosis in her brother.  Social History:   reports that she has never smoked. She has never used smokeless tobacco. She reports that she does not drink alcohol or use drugs.  Current Medications:  ALPRAZolam (XANAX) 0.5 MG tablet TAKE 1 TABLET (0.5 MG TOTAL) BY MOUTH 3 (THREE) TIMES DAILY AS NEEDED FOR ANXIETY. Lysle Pearl, Avian Konigsberg, DO Reordered  Ordered as: ALPRAZolam Duanne Moron) tablet 0.5 mg - 0.5 mg, Oral, 3 times daily PRN, anxiety, Starting Sat 12/19/18 at 2025  aspirin EC 81 MG tablet Take 81 mg by mouth daily. Lysle Pearl, Zanayah Shadowens, DO Not Ordered  atorvastatin (LIPITOR) 80 MG tablet Take 1 tablet (80 mg total) by mouth daily. Lysle Pearl, Sundi Slevin, DO Reordered  Ordered as: atorvastatin (LIPITOR) tablet 80 mg - 80 mg, Oral, Daily, First dose on Sat 12/19/18 at 2030  citalopram (CELEXA) 10 MG tablet Take 1 tablet (10 mg total) by mouth daily. Lysle Pearl, Kazmir Oki, DO Reordered  Ordered as: citalopram (CELEXA) tablet 10 mg - 10 mg, Oral, Daily, First dose on Sat 12/19/18 at 2030  fluticasone (FLONASE) 50 MCG/ACT nasal spray Place 2 sprays into both nostrils daily. Benjamine Sprague, DO Not Ordered  HYDROcodone-acetaminophen (NORCO/VICODIN) 5-325 MG tablet Take 1 tablet by mouth every 8 (eight) hours as needed for moderate pain. Benjamine Sprague, DO Not Ordered   Patient not taking: Reported on 09/29/2018    ibuprofen (ADVIL) 800 MG tablet ONE TAB DAILY AS NEEDED. TRY TO MINIMIZE USE TO SEVERE PAIN. Benjamine Sprague, DO Not Ordered  methocarbamol (ROBAXIN) 500 MG tablet Take 1 tablet (500 mg total) by mouth every 8 (eight) hours as needed for up to 5 days. Lysle Pearl, Elysabeth Aust, DO Not Ordered  nystatin cream (MYCOSTATIN) Apply 1 application topically 2 (two) times daily. Mix  with equal amounts of hydrocortisone cream Lysle Pearl, Carly Sabo, DO Reordered  Ordered as: nystatin cream (MYCOSTATIN) 1 application - 1 application, Topical, 2 times daily, First dose on Sat 12/19/18 at 2200 Apply to affected area as needed   omeprazole (PRILOSEC) 20 MG capsule Take 20 mg by mouth daily as needed (reflex).  Lysle Pearl,  Nader Boys, DO Reordered  Ordered as: pantoprazole (PROTONIX) EC tablet 40 mg - 40 mg, Oral, Daily, First dose on Sat 12/19/18 at 2030     Allergies:  Allergies as of 12/19/2018 - Review Complete 12/19/2018  Allergen Reaction Noted  . Levofloxacin  12/19/2016  . Calcium channel blockers  08/01/2014  . Amoxicillin Other (See Comments) 08/01/2014  . Nickel Rash 09/16/2014  . Penicillins Other (See Comments) 08/01/2014    ROS:  General: Denies weight loss, weight gain, fatigue, fevers, chills, and night sweats. Eyes: Denies blurry vision, double vision, eye pain, itchy eyes, and tearing. Ears: Denies hearing loss, earache, and ringing in ears. Nose: Denies sinus pain, congestion, infections, runny nose, and nosebleeds. Mouth/throat: Denies hoarseness, sore throat, bleeding gums, and difficulty swallowing. Heart: Denies chest pain, palpitations, racing heart, irregular heartbeat, leg pain or swelling, and decreased activity tolerance. Respiratory: Denies breathing difficulty, shortness of breath, wheezing, cough, and sputum. GI: Denies change in appetite, heartburn, constipation, diarrhea, and blood in stool. GU: Denies difficulty urinating, pain with urinating, urgency, frequency, blood in urine. Musculoskeletal: Denies joint stiffness, pain, swelling, muscle weakness. Skin: Denies rash, itching, mass, tumors, sores, and boils Neurologic: Denies headache, fainting, dizziness, seizures, numbness, and tingling. Psychiatric: Denies depression, anxiety, difficulty sleeping, and memory loss. Endocrine: Denies heat or cold intolerance, and increased thirst or urination. Blood/lymph: Denies easy bruising, easy bruising, and swollen glands     Objective:     BP (!) 169/94   Pulse 76   Temp 98.1 F (36.7 C) (Oral)   Resp 14   Ht 5\' 3"  (1.6 m)   Wt 82.8 kg   SpO2 99%   BMI 32.35 kg/m   Constitutional :  alert, cooperative, appears stated age and no distress  Lymphatics/Throat:  no  asymmetry, masses, or scars  Respiratory:  clear to auscultation bilaterally  Cardiovascular:  regular rate and rhythm  Gastrointestinal: Soft, no guarding, focal tenderness noted in epigastric region..   Musculoskeletal: Steady movement  Skin: Cool and moist, visible midline surgical scars   Psychiatric: Normal affect, non-agitated, not confused       LABS:  CMP Latest Ref Rng & Units 12/19/2018 08/11/2018 02/08/2018  Glucose 70 - 99 mg/dL 148(H) 98 121(H)  BUN 8 - 23 mg/dL 16 13 15   Creatinine 0.44 - 1.00 mg/dL 0.76 0.80 0.80  Sodium 135 - 145 mmol/L 135 136 131(L)  Potassium 3.5 - 5.1 mmol/L 4.3 4.2 2.8(L)  Chloride 98 - 111 mmol/L 104 103 98  CO2 22 - 32 mmol/L 21(L) 19(L) 24  Calcium 8.9 - 10.3 mg/dL 9.3 9.2 8.4(L)  Total Protein 6.5 - 8.1 g/dL 8.0 6.9 -  Total Bilirubin 0.3 - 1.2 mg/dL 1.4(H) 0.8 -  Alkaline Phos 38 - 126 U/L 55 62 -  AST 15 - 41 U/L 27 19 -  ALT 0 - 44 U/L 18 15 -   CBC Latest Ref Rng & Units 12/19/2018 08/11/2018 02/08/2018  WBC 4.0 - 10.5 K/uL 7.2 4.9 8.6  Hemoglobin 12.0 - 15.0 g/dL 15.8(H) 15.3 15.9(H)  Hematocrit 36.0 - 46.0 % 45.3 45.5 46.1(H)  Platelets 150 - 400 K/uL 266 254 254  RADS: CLINICAL DATA:  73 year old female with abdominal pain. Concern for gastroenteritis versus colitis.  EXAM: CT ABDOMEN AND PELVIS WITH CONTRAST  TECHNIQUE: Multidetector CT imaging of the abdomen and pelvis was performed using the standard protocol following bolus administration of intravenous contrast.  CONTRAST:  160mL OMNIPAQUE IOHEXOL 300 MG/ML  SOLN  COMPARISON:  CT of the abdomen pelvis dated 02/04/2018  FINDINGS: Lower chest: The visualized lung bases are clear.  No intra-abdominal free air. There is small free fluid within the abdomen pelvis.  Hepatobiliary: The liver is unremarkable. No intrahepatic biliary ductal dilatation. Cholecystectomy.  Pancreas: Unremarkable. No pancreatic ductal dilatation or surrounding inflammatory  changes.  Spleen: A 15 mm splenic hypoenhancing lesion is not well characterized but appears stable most consistent with a hemangioma. The spleen is otherwise unremarkable.  Adrenals/Urinary Tract: The adrenal glands are unremarkable. There is no hydronephrosis on either side. There is symmetric enhancement and excretion of contrast by both kidneys. The visualized ureters and urinary bladder appear unremarkable.  Stomach/Bowel: There is a small hiatal hernia with gastroesophageal reflux. The stomach is distended with contrast and oral content. No evidence of gastric outlet obstruction. Postsurgical changes of the bowel with anastomotic suture in the anterior abdomen. There is dilatation of several loops of small bowel measuring up to 3.5 cm in diameter. A transition point is noted in the lower abdomen (series 2, image 55 and coronal series 5, image 32). Findings consistent with small-bowel obstruction, likely secondary to adhesions. There is abutment of loops of small bowel to the anterior peritoneal wall at the anastomotic site which represent additional area of adhesion. No obstruction noted at the anastomotic site. There is extensive colonic diverticulosis without active inflammatory changes. The appendix is not visualized with certainty. No inflammatory changes identified in the right lower quadrant.  Vascular/Lymphatic: Mild aortoiliac atherosclerotic disease. The IVC is unremarkable. No portal venous gas. There is no adenopathy.  Reproductive: Hysterectomy. No adnexal masses.  Other: Midline vertical anterior abdominal wall incisional scar.  Musculoskeletal: Degenerative changes of the spine. No acute osseous pathology.  IMPRESSION: 1. Small-bowel obstruction with transition point in the lower abdomen likely secondary to adhesions. 2. Extensive colonic diverticulosis. 3. Aortic atherosclerosis.  Aortic Atherosclerosis (ICD10-I70.0).   Electronically  Signed   By: Anner Crete M.D.   On: 12/19/2018 19:46 Assessment:   Small bowel obstruction, likely secondary to adhesions from previous surgery.  Possible involvement of the anastomosis from colon surgery.  Plan:    Previous episode resolved with NG tube decompression and n.p.o.  We will initially try with such.  We will also follow with serial abdominal x-rays to monitor the advancement of contrast through the bowel.  NG tube, IV fluids, and pain control in the meantime.  Patient verbalized understanding and is agreeable to plan at this time.  Patient has specifically said multiple times since initial discussion that if surgery becomes warranted, she would like to have Dr. Tollie Pizza performed the procedure since she has an established relationship with him.  We will notify him of the admission.

## 2018-12-19 NOTE — ED Notes (Signed)
Pt states she cannot drink any more contrast due to "stomach tightness". LLQ feels tight. CT notified.

## 2018-12-19 NOTE — ED Notes (Signed)
Pt states she wont be able to drink all of the contrast, pt encouraged to drink as much as possible.

## 2018-12-19 NOTE — ED Notes (Addendum)
Pt returned from CT. Pt alert but states she wants to rest. Pt states she still has discomfort but does not want any pain meds at this time.

## 2018-12-19 NOTE — ED Provider Notes (Signed)
[facility] Emergency Department Provider Note ____________________________________________   None    (approximate)  I have reviewed the triage vital signs and the nursing notes.   HISTORY  Chief Complaint Abdominal Pain  HPI [name] is a 73 y.o. female who presents to the emergency department for treatment and evaluation of abdominal pain.  Patient states that symptoms started approximately 1 AM.  She has had previous small bowel obstructions and reports that symptoms today are similar to those in the past.  She did call the gastroenterologist who advised her to come to the emergency department.  Patient states that she has been unable to tolerate any food or fluids today due to abdominal pain and nausea.   Past Medical History:  Diagnosis Date  . Anxiety   . Arthritis    lower spine  . Back pain    lower back - S/P lifting injury  . Complication of anesthesia    makes hair brittle  . GERD (gastroesophageal reflux disease)   . Hyperlipidemia   . Hypertension   . Neuromuscular disorder ([facility])    numbness legs and feet s/p lower back injury  . Partial bowel obstruction ([place]) 02/04/2018  . Stroke [facility])    "mini - strokes" 2009 - no deficit  . Vertigo    none recently  . Vitamin D deficiency   . Wears contact lenses     Patient Active Problem List   Diagnosis Date Noted  . Partial small bowel obstruction ([place]) 02/04/2018  . SBO (small bowel obstruction) ([place]) 03/13/2017  . Panic disorder 02/21/2016  . Weakness of both lower extremities 02/21/2016  . Depression 02/21/2016  . Problems with swallowing and mastication   . Stricture and stenosis of esophagus   . 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 Acid reflux 10/20/2014  . Hx of colonic polyps   . Benign neoplasm of transverse colon   . Diverticulosis of large intestine without diverticulitis   . Allergic rhinitis 08/01/2014  . Anxiety 08/01/2014  . HLD (hyperlipidemia) 08/01/2014  . BP  (high blood pressure) 08/01/2014  . Vitamin D deficiency 08/01/2014  . H/O adenomatous polyp of colon 01/22/2012    Past Surgical History:  Procedure Laterality Date  . ABDOMINAL HYSTERECTOMY  1987  . CARDIAC CATHETERIZATION  02/2007  . COLON SURGERY  03/12/2012   Dr Phylis Bougie  . COLONOSCOPY WITH PROPOFOL N/A 09/26/2014   Procedure: COLONOSCOPY WITH PROPOFOL;  Surgeon: Lucilla Lame, MD;  Location: [place];  Service: Endoscopy;  Laterality: N/A;  marker (tattoo) used in colon  . ESOPHAGEAL DILATION N/A 02/02/2016   Procedure: ESOPHAGEAL DILATION;  Surgeon: Lucilla Lame, MD;  Location: [place];  Service: Endoscopy;  Laterality: N/A;  . ESOPHAGOGASTRODUODENOSCOPY (EGD) WITH PROPOFOL N/A 02/02/2016   Procedure: ESOPHAGOGASTRODUODENOSCOPY (EGD) WITH PROPOFOL;  Surgeon: Lucilla Lame, MD;  Location: [place];  Service: Endoscopy;  Laterality: N/A;  . POLYPECTOMY  09/26/2014   Procedure: POLYPECTOMY;  Surgeon: Lucilla Lame, MD;  Location: [place];  Service: Endoscopy;;  . [place]    Prior to Admission medications   Medication Sig Start Date End Date Taking? Authorizing Provider  ALPRAZolam (XANAX) 0.5 MG tablet TAKE 1 TABLET (0.5 MG TOTAL) BY MOUTH 3 (THREE) TIMES DAILY AS NEEDED FOR ANXIETY. 10/27/18   Birdie Sons, MD  aspirin EC 81 MG tablet Take 81 mg by mouth daily.    [provider]  atorvastatin (LIPITOR) 80 MG tablet Take 1 tablet (80  mg total) by mouth daily. 06/30/17   Birdie Sons, MD  citalopram (CELEXA) 10 MG tablet Take 1 tablet (10 mg total) by mouth daily. 12/15/18   Birdie Sons, MD  fluticasone (FLONASE) 50 MCG/ACT nasal spray Place 2 sprays into both nostrils daily. 07/23/18   Bacigalupo, Dionne Bucy, MD  HYDROcodone-acetaminophen (NORCO/VICODIN) 5-325 MG tablet Take 1 tablet by mouth every 8 (eight) hours as needed for moderate pain. Patient not taking: Reported on 09/29/2018 09/08/18   Birdie Sons, MD   ibuprofen (ADVIL) 800 MG tablet ONE TAB DAILY AS NEEDED. TRY TO MINIMIZE USE TO SEVERE PAIN. 11/10/18   Birdie Sons, MD  methocarbamol (ROBAXIN) 500 MG tablet Take 1 tablet (500 mg total) by mouth every 8 (eight) hours as needed for up to 5 days. 12/17/18 12/22/18  Lannie Fields, PA-C  nystatin cream (MYCOSTATIN) Apply 1 application topically 2 (two) times daily. Mix with equal amounts of hydrocortisone cream 09/08/18   Birdie Sons, MD  omeprazole (PRILOSEC) 20 MG capsule Take 20 mg by mouth daily as needed (reflex).     [provider]    Allergies Levofloxacin, Calcium channel blockers, Amoxicillin, Nickel, and Penicillins  Family History  Problem Relation Age of Onset  . Diabetes Mother   . Heart disease Mother   . Hypertension Mother   . Mental illness Mother   . Cancer Father        lung cancer  . Drug abuse Brother   . Multiple sclerosis Brother     Social History Social History   Tobacco Use  . Smoking status: Never Smoker  . Smokeless tobacco: Never Used  Substance Use Topics  . Alcohol use: No    Alcohol/week: 0.0 standard drinks  . Drug use: No    Review of Systems  Constitutional: No fever/chills Eyes: No visual changes. ENT: No sore throat. Cardiovascular: Denies chest pain. Respiratory: Denies shortness of breath. Gastrointestinal: Positive for abdominal pain.  Positive for nausea, no vomiting.  No diarrhea.  No constipation. Genitourinary: Negative for dysuria. Musculoskeletal: Negative for back pain. Skin: Negative for rash. Neurological: Negative for headaches, focal weakness or numbness. ____________________________________________   PHYSICAL EXAM:  VITAL SIGNS: ED Triage Vitals [12/19/18 1544]  Enc Vitals Group     BP (!) 161/97     Pulse Rate 72     Resp 16     Temp 98.1 F (36.7 C)     Temp Source Oral     SpO2 99 %     Weight 182 lb 9.6 oz (82.8 kg)     Height 5\' 3"  (1.6 m)     Head Circumference      Peak Flow       Pain Score 10     Pain Loc      Pain Edu?      Excl. in [place]?     Constitutional: Alert and oriented. Well appearing and in no acute distress. Eyes: Conjunctivae are normal. PERRL. EOMI. Head: Atraumatic. Nose: No congestion/rhinnorhea. Mouth/Throat: Mucous membranes are moist.  Oropharynx non-erythematous. Neck: No stridor.   Hematological/Lymphatic/Immunilogical: No cervical lymphadenopathy. Cardiovascular: Normal rate, regular rhythm. Grossly normal heart sounds.  Good peripheral circulation. Respiratory: Normal respiratory effort.  No retractions. Lungs CTAB. Gastrointestinal: Soft and diffusely tender. No distention. No abdominal bruits.. Genitourinary:  Musculoskeletal: No lower extremity tenderness nor edema.  No joint effusions. Neurologic:  Normal speech and language. No gross focal neurologic deficits are appreciated. No gait instability. Skin:  Skin is warm, dry and intact. No rash noted. Psychiatric: Mood and affect are normal. Speech and behavior are normal.  ____________________________________________   LABS (all labs ordered are listed, but only abnormal results are displayed)  Labs Reviewed  COMPREHENSIVE METABOLIC PANEL - Abnormal; Notable for the following components:      Result Value   CO2 21 (*)    Glucose, Bld 148 (*)    Total Bilirubin 1.4 (*)    All other components within normal limits  CBC - Abnormal; Notable for the following components:   RBC 5.15 (*)    Hemoglobin 15.8 (*)    All other components within normal limits  URINALYSIS, COMPLETE (UACMP) WITH MICROSCOPIC - Abnormal; Notable for the following components:   Color, Urine YELLOW (*)    APPearance HAZY (*)    Glucose, UA 50 (*)    Ketones, ur 5 (*)    Protein, ur 100 (*)    All other components within normal limits  LIPASE, BLOOD   ____________________________________________  EKG  ED ECG REPORT I, Trebor Galdamez, FNP-BC personally viewed and interpreted this ECG.   Date:  12/19/2018  EKG Time: 1633  Rate: 68  Rhythm: normal EKG, normal sinus rhythm  Axis: normal  Intervals:none  ST&T Change: no ST elevation  ____________________________________________  RADIOLOGY  ED MD interpretation:    Small bowel obstruction  Official radiology report(s): Dg Abdomen 1 View  Result Date: 12/19/2018 CLINICAL DATA:  Pain, history of small-bowel obstruction, hypertension, GERD EXAM: ABDOMEN - 1 VIEW COMPARISON:  02/08/2018 FINDINGS: Several air-filled dilated loops of small bowel in the mid abdomen. Paucity of colonic gas. Minimal gas and stool in rectum. Findings most suggestive of small bowel obstruction. No definite bowel wall thickening. Osseous structures demineralized. No urinary tract calcification. Surgical clips RIGHT upper quadrant likely reflect cholecystectomy. IMPRESSION: Few dilated small bowel loops in mid abdomen with paucity of colonic gas most suggestive of small bowel obstruction. Electronically Signed   By: Lavonia Dana M.D.   On: 12/19/2018 16:39    ____________________________________________   PROCEDURES  Procedure(s) performed (including Critical Care):  Procedures  ____________________________________________   INITIAL IMPRESSION / ASSESSMENT AND PLAN     73 year old female presenting to the emergency department for what she feels to be a bowel obstruction.  She states that she has had similar symptoms in the past.  See HPI for further details.  Initial plan will be to obtain labs and plain image of the abdomen.  She will also likely require CT of the abdomen pelvis.  Plan discussed with the patient and she agrees.  DIFFERENTIAL DIAGNOSIS  Partial small bowel obstruction, small bowel obstruction, idiopathic abdominal pain, pelvic mass, diverticulosis, diverticulitis  ED COURSE  Image of abdomen shows concern for small bowel obstruction. Surgery notified. CT abdomen and pelvis with IV and oral contrast per his request.    ----------------------------------------- 6:09 PM on 12/19/2018 -----------------------------------------  Care transitioned to Dr. Jari Pigg who will follow the patient to disposition. ____________________________________________   FINAL CLINICAL IMPRESSION(S) / ED DIAGNOSES  Final diagnoses:  None     ED Discharge Orders    None       Note:  This document was prepared using Dragon voice recognition software and may include unintentional dictation errors.   Victorino Dike, FNP 12/19/18 1812    [place] , MD 12/19/18 2028
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
## 2018-12-19 NOTE — ED Notes (Signed)
Pt appears comfortable at present, no N/V. Waiting for radiologist to read xray for NG placement. Family at bedside.

## 2018-12-20 ENCOUNTER — Encounter: Payer: Self-pay | Admitting: *Deleted

## 2018-12-20 ENCOUNTER — Inpatient Hospital Stay: Payer: Medicare Other

## 2018-12-20 LAB — BASIC METABOLIC PANEL
Anion gap: 10 (ref 5–15)
BUN: 17 mg/dL (ref 8–23)
CO2: 20 mmol/L — ABNORMAL LOW (ref 22–32)
Calcium: 8.7 mg/dL — ABNORMAL LOW (ref 8.9–10.3)
Chloride: 104 mmol/L (ref 98–111)
Creatinine, Ser: 0.69 mg/dL (ref 0.44–1.00)
GFR calc Af Amer: >60 mL/min (ref >60)
GFR calc non Af Amer: >60 mL/min (ref >60)
Glucose, Bld: 143 mg/dL — ABNORMAL HIGH (ref 70–99)
Potassium: 4.4 mmol/L (ref 3.5–5.1)
Sodium: 134 mmol/L — ABNORMAL LOW (ref 135–145)

## 2018-12-20 LAB — CBC
HCT: 49.2 % — ABNORMAL HIGH (ref 36.0–46.0)
Hemoglobin: 16.6 g/dL — ABNORMAL HIGH (ref 12.0–15.0)
MCH: 30.5 pg (ref 26.0–34.0)
MCHC: 33.7 g/dL (ref 30.0–36.0)
MCV: 90.3 fL (ref 80.0–100.0)
Platelets: 251 10*3/uL (ref 150–400)
RBC: 5.45 MIL/uL — ABNORMAL HIGH (ref 3.87–5.11)
RDW: 13 % (ref 11.5–15.5)
WBC: 10.4 10*3/uL (ref 4.0–10.5)
nRBC: 0 % (ref 0.0–0.2)

## 2018-12-20 LAB — PHOSPHORUS: Phosphorus: 3.7 mg/dL (ref 2.5–4.6)

## 2018-12-20 LAB — MAGNESIUM: Magnesium: 2.3 mg/dL (ref 1.7–2.4)

## 2018-12-20 MED ORDER — CALCIUM CARBONATE ANTACID 500 MG PO CHEW
1.0000 | CHEWABLE_TABLET | Freq: Once | ORAL | Status: AC
  Administered 2018-12-20: 200 mg via ORAL
  Filled 2018-12-20 (×2): qty 1

## 2018-12-20 MED ORDER — SIMETHICONE 80 MG PO CHEW
80.0000 mg | CHEWABLE_TABLET | Freq: Four times a day (QID) | ORAL | Status: DC | PRN
  Administered 2018-12-20 – 2018-12-21 (×2): 80 mg via ORAL
  Filled 2018-12-20 (×4): qty 1

## 2018-12-20 NOTE — ED Notes (Signed)
Pt requesting medication for increased nausea.

## 2018-12-20 NOTE — ED Notes (Signed)
Pt hooked back up to intermittent suction.

## 2018-12-20 NOTE — ED Notes (Signed)
Pt asleep, even unlabored respirations, environment secured.

## 2018-12-20 NOTE — ED Notes (Signed)
Surgery at bedside.

## 2018-12-20 NOTE — ED Notes (Signed)
Pt assisted on bedpan.

## 2018-12-20 NOTE — ED Notes (Signed)
This RN gave pt PO meds and stopped suction @ 1850. Suction can resume in 75mins. Night shift nurse aware.

## 2018-12-21 LAB — CBC
HCT: 45.6 % (ref 36.0–46.0)
Hemoglobin: 15.8 g/dL — ABNORMAL HIGH (ref 12.0–15.0)
MCH: 30.6 pg (ref 26.0–34.0)
MCHC: 34.6 g/dL (ref 30.0–36.0)
MCV: 88.4 fL (ref 80.0–100.0)
Platelets: 259 10*3/uL (ref 150–400)
RBC: 5.16 MIL/uL — ABNORMAL HIGH (ref 3.87–5.11)
RDW: 12.8 % (ref 11.5–15.5)
WBC: 12.1 10*3/uL — ABNORMAL HIGH (ref 4.0–10.5)
nRBC: 0 % (ref 0.0–0.2)

## 2018-12-21 LAB — BASIC METABOLIC PANEL
Anion gap: 8 (ref 5–15)
BUN: 25 mg/dL — ABNORMAL HIGH (ref 8–23)
CO2: 25 mmol/L (ref 22–32)
Calcium: 9.2 mg/dL (ref 8.9–10.3)
Chloride: 104 mmol/L (ref 98–111)
Creatinine, Ser: 0.83 mg/dL (ref 0.44–1.00)
GFR calc Af Amer: >60 mL/min (ref >60)
GFR calc non Af Amer: >60 mL/min (ref >60)
Glucose, Bld: 121 mg/dL — ABNORMAL HIGH (ref 70–99)
Potassium: 3.9 mmol/L (ref 3.5–5.1)
Sodium: 137 mmol/L (ref 135–145)

## 2018-12-21 LAB — PHOSPHORUS: Phosphorus: 3 mg/dL (ref 2.5–4.6)

## 2018-12-21 LAB — MAGNESIUM: Magnesium: 2.2 mg/dL (ref 1.7–2.4)

## 2018-12-21 NOTE — Progress Notes (Signed)
Subjective:  CC: [name] is a 73 y.o. female  Hospital stay day 2,   SBO  HPI: No change since admission.   ROS:  General: Denies weight loss, weight gain, fatigue, fevers, chills, and night sweats. Heart: Denies chest pain, palpitations, racing heart, irregular heartbeat, leg pain or swelling, and decreased activity tolerance. Respiratory: Denies breathing difficulty, shortness of breath, wheezing, cough, and sputum. GI: Denies change in appetite, heartburn,  vomiting, constipation, diarrhea, and blood in stool. GU: Denies difficulty urinating, pain with urinating, urgency, frequency, blood in urine.   Objective:   Temp:  [98.4 F (36.9 C)-98.6 F (37 C)] 98.4 F (36.9 C) (11/02 0432) Pulse Rate:  [72-77] 76 (11/02 0432) Resp:  [14-18] 14 (11/01 2143) BP: (148-170)/(80-89) 170/80 (11/02 0432) SpO2:  [95 %-97 %] 96 % (11/02 0432) Weight:  [77.6 kg] 77.6 kg (11/01 2145)     Height: 5\' 3"  (160 cm) Weight: 77.6 kg BMI (Calculated): 30.31   Intake/Output this shift:   Intake/Output Summary (Last 24 hours) at 12/21/2018 1141 Last data filed at 12/21/2018 [id number] Gross per 24 hour  Intake 350 ml  Output 350 ml  Net 0 ml    Constitutional :  alert, cooperative, appears stated age and no distress  Respiratory:  clear to auscultation bilaterally  Cardiovascular:  regular rate and rhythm  Gastrointestinal: Soft, no guarding, but tenderness noted in the epigastric region..   Skin: Cool and moist.   Psychiatric: Normal affect, non-agitated, not confused       LABS:  CMP Latest Ref Rng & Units 12/21/2018 12/20/2018 12/19/2018  Glucose 70 - 99 mg/dL 121(H) 143(H) 148(H)  BUN 8 - 23 mg/dL 25(H) 17 16  Creatinine 0.44 - 1.00 mg/dL 0.83 0.69 0.76  Sodium 135 - 145 mmol/L 137 134(L) 135  Potassium 3.5 - 5.1 mmol/L 3.9 4.4 4.3  Chloride 98 - 111 mmol/L 104 104 104  CO2 22 - 32 mmol/L 25 20(L) 21(L)  Calcium 8.9 - 10.3 mg/dL 9.2 8.7(L) 9.3  Total Protein 6.5 - 8.1 g/dL - - 8.0   Total Bilirubin 0.3 - 1.2 mg/dL - - 1.4(H)  Alkaline Phos 38 - 126 U/L - - 55  AST 15 - 41 U/L - - 27  ALT 0 - 44 U/L - - 18   CBC Latest Ref Rng & Units 12/21/2018 12/20/2018 12/19/2018  WBC 4.0 - 10.5 K/uL 12.1(H) 10.4 7.2  Hemoglobin 12.0 - 15.0 g/dL 15.8(H) 16.6(H) 15.8(H)  Hematocrit 36.0 - 46.0 % 45.6 49.2(H) 45.3  Platelets 150 - 400 K/uL 259 251 266    RADS: n/a Assessment:   SBO.  Will continue NG tube decompression

## 2018-12-21 NOTE — Progress Notes (Signed)
Subjective:  CC: [name] is a 73 y.o. female  Hospital stay day 2,   SBO  HPI: Positive flatus, feeling better overall.  ROS:  General: Denies weight loss, weight gain, fatigue, fevers, chills, and night sweats. Heart: Denies chest pain, palpitations, racing heart, irregular heartbeat, leg pain or swelling, and decreased activity tolerance. Respiratory: Denies breathing difficulty, shortness of breath, wheezing, cough, and sputum. GI: Denies change in appetite, heartburn,  vomiting, constipation, diarrhea, and blood in stool. GU: Denies difficulty urinating, pain with urinating, urgency, frequency, blood in urine.   Objective:   Temp:  [98.4 F (36.9 C)-98.6 F (37 C)] 98.4 F (36.9 C) (11/02 0432) Pulse Rate:  [72-77] 76 (11/02 0432) Resp:  [14-18] 14 (11/01 2143) BP: (148-170)/(80-89) 170/80 (11/02 0432) SpO2:  [95 %-97 %] 96 % (11/02 0432) Weight:  [77.6 kg] 77.6 kg (11/01 2145)     Height: 5\' 3"  (160 cm) Weight: 77.6 kg BMI (Calculated): 30.31   Intake/Output this shift:   Intake/Output Summary (Last 24 hours) at 12/21/2018 1143 Last data filed at 12/21/2018 [id number] Gross per 24 hour  Intake 350 ml  Output 350 ml  Net 0 ml    Constitutional :  alert, cooperative, appears stated age and no distress  Respiratory:  clear to auscultation bilaterally  Cardiovascular:  regular rate and rhythm  Gastrointestinal: soft, non-tender; bowel sounds normal; no masses,  no organomegaly.   Skin: Cool and moist.   Psychiatric: Normal affect, non-agitated, not confused       LABS:  CMP Latest Ref Rng & Units 12/21/2018 12/20/2018 12/19/2018  Glucose 70 - 99 mg/dL 121(H) 143(H) 148(H)  BUN 8 - 23 mg/dL 25(H) 17 16  Creatinine 0.44 - 1.00 mg/dL 0.83 0.69 0.76  Sodium 135 - 145 mmol/L 137 134(L) 135  Potassium 3.5 - 5.1 mmol/L 3.9 4.4 4.3  Chloride 98 - 111 mmol/L 104 104 104  CO2 22 - 32 mmol/L 25 20(L) 21(L)  Calcium 8.9 - 10.3 mg/dL 9.2 8.7(L) 9.3  Total Protein 6.5 - 8.1  g/dL - - 8.0  Total Bilirubin 0.3 - 1.2 mg/dL - - 1.4(H)  Alkaline Phos 38 - 126 U/L - - 55  AST 15 - 41 U/L - - 27  ALT 0 - 44 U/L - - 18   CBC Latest Ref Rng & Units 12/21/2018 12/20/2018 12/19/2018  WBC 4.0 - 10.5 K/uL 12.1(H) 10.4 7.2  Hemoglobin 12.0 - 15.0 g/dL 15.8(H) 16.6(H) 15.8(H)  Hematocrit 36.0 - 46.0 % 45.6 49.2(H) 45.3  Platelets 150 - 400 K/uL 259 251 266    RADS: n/a Assessment:   SBO.  Positive flatus.  Will try clamp trial and then if passes start clears and advance as tolerated.

## 2018-12-22 LAB — CBC
HCT: 43.3 % (ref 36.0–46.0)
Hemoglobin: 14.9 g/dL (ref 12.0–15.0)
MCH: 30.5 pg (ref 26.0–34.0)
MCHC: 34.4 g/dL (ref 30.0–36.0)
MCV: 88.7 fL (ref 80.0–100.0)
Platelets: 226 10*3/uL (ref 150–400)
RBC: 4.88 MIL/uL (ref 3.87–5.11)
RDW: 12.3 % (ref 11.5–15.5)
WBC: 8.7 10*3/uL (ref 4.0–10.5)
nRBC: 0 % (ref 0.0–0.2)

## 2018-12-22 LAB — BASIC METABOLIC PANEL
Anion gap: 8 (ref 5–15)
BUN: 19 mg/dL (ref 8–23)
CO2: 26 mmol/L (ref 22–32)
Calcium: 8.5 mg/dL — ABNORMAL LOW (ref 8.9–10.3)
Chloride: 101 mmol/L (ref 98–111)
Creatinine, Ser: 0.83 mg/dL (ref 0.44–1.00)
GFR calc Af Amer: >60 mL/min (ref >60)
GFR calc non Af Amer: >60 mL/min (ref >60)
Glucose, Bld: 93 mg/dL (ref 70–99)
Potassium: 3.2 mmol/L — ABNORMAL LOW (ref 3.5–5.1)
Sodium: 135 mmol/L (ref 135–145)

## 2018-12-22 LAB — MAGNESIUM: Magnesium: 2.3 mg/dL (ref 1.7–2.4)

## 2018-12-22 LAB — PHOSPHORUS: Phosphorus: 2.6 mg/dL (ref 2.5–4.6)

## 2018-12-22 MED ORDER — POTASSIUM CHLORIDE CRYS ER 10 MEQ PO TBCR
30.0000 meq | EXTENDED_RELEASE_TABLET | ORAL | Status: AC
  Administered 2018-12-22 (×2): 30 meq via ORAL
  Filled 2018-12-22 (×2): qty 3

## 2018-12-22 NOTE — Discharge Summary (Signed)
Physician Discharge Summary  Patient ID: [name] MRN: [id number] DOB/AGE: 71/26/1947 71 y.o.  Admit date: 12/19/2018 Discharge date: 12/22/2018  Admission Diagnoses: Small bowel obstruction  Discharge Diagnoses:  Same as above, hypertension  Discharged Condition: good  Hospital Course: Admitted for above.  Underwent n.p.o., NG tube decompression.  Started having return of bowel function, therefore NG tube was discontinued and diet restarted slowly.  Tolerated diet well, and asymptomatic at time of discharge.  Of note she was noted to have persistently elevated blood pressure with a systolic in the [id number] and [id number].  This is a new issue for her.  Recommend that she follow-up with her primary care doctor for possible chronic management.  She was also seen for her small bowel obstruction episodes in the past by Dr. Tollie Pizza.  He recommended that patient follow-up with him as an outpatient regarding her recurrent bowel obstructions as well.  Consults: None  Discharge Exam: Blood pressure (!) 142/86, pulse 67, temperature 98.6 F (37 C), temperature source Oral, resp. rate 12, height 5\' 3"  (1.6 m), weight 77.6 kg, SpO2 100 %. General appearance: alert, cooperative and no distress GI: soft, non-tender; bowel sounds normal; no masses,  no organomegaly  Disposition:  Discharge disposition: 01-Home or Self Care       Discharge Instructions    Discharge patient   Complete by: As directed    Discharge disposition: 01-Home or Self Care   Discharge patient date: 12/22/2018     Allergies as of 12/22/2018      Reactions   Levofloxacin    Tongue and mouth swelling   Calcium Channel Blockers    Other reaction(s): Dizziness   Amoxicillin Other (See Comments)   Has patient had a PCN reaction causing immediate rash, facial/tongue/throat swelling, SOB or lightheadedness with hypotension: No Has patient had a PCN reaction causing severe rash involving mucus membranes or skin necrosis:  No Has patient had a PCN reaction that required hospitalization: No Has patient had a PCN reaction occurring within the last 10 years: No If all of the above answers are "NO", then may proceed with Cephalosporin use.   Nickel Rash   Penicillins Other (See Comments)   Has patient had a PCN reaction causing immediate rash, facial/tongue/throat swelling, SOB or lightheadedness with hypotension: No Has patient had a PCN reaction causing severe rash involving mucus membranes or skin necrosis: No Has patient had a PCN reaction that required hospitalization: No Has patient had a PCN reaction occurring within the last 10 years: No If all of the above answers are "NO", then may proceed with Cephalosporin use.      Medication List    STOP taking these medications   atorvastatin 80 MG tablet Commonly known as: LIPITOR   HYDROcodone-acetaminophen 5-325 MG tablet Commonly known as: NORCO/VICODIN   nystatin cream Commonly known as: MYCOSTATIN     TAKE these medications   ALPRAZolam 0.5 MG tablet Commonly known as: XANAX TAKE 1 TABLET (0.5 MG TOTAL) BY MOUTH 3 (THREE) TIMES DAILY AS NEEDED FOR ANXIETY. What changed:   when to take this  reasons to take this   aspirin EC 81 MG tablet Take 81 mg by mouth daily.   citalopram 10 MG tablet Commonly known as: CeleXA Take 1 tablet (10 mg total) by mouth daily.   fluticasone 50 MCG/ACT nasal spray Commonly known as: FLONASE Place 2 sprays into both nostrils daily. What changed:   how much to take  when to take this  reasons to  take this   ibuprofen 800 MG tablet Commonly known as: ADVIL ONE TAB DAILY AS NEEDED. TRY TO MINIMIZE USE TO SEVERE PAIN. What changed: See the new instructions.   methocarbamol 500 MG tablet Commonly known as: Robaxin Take 1 tablet (500 mg total) by mouth every 8 (eight) hours as needed for up to 5 days.   omeprazole 20 MG capsule Commonly known as: PRILOSEC Take 20 mg by mouth daily as needed (acid  reflux).      Follow-up Information    [name], [name], MD Follow up.   Specialty: Family Medicine Why: for persistant hypertension while inpatient Contact information: [address] [place] [address] [id number]        Robert Bellow, MD Follow up in 2 week(s).   Specialties: General Surgery, Radiology Why: for followup from recent hospitalization Contact information: [address] [place] [address] [phone]            Total time spent arranging discharge was >83min. Signed: Benjamine Sprague 12/22/2018, 3:50 PM

## 2018-12-22 NOTE — Progress Notes (Signed)
Patient cleared for discharge.   Education complete. AVS printed. Discharge instructions given. All questions answered for patient clarification.  Prescriptions given, pharmacy verified.  IV removed.  Discharged to home via POV     

## 2018-12-22 NOTE — Discharge Instructions (Signed)
Bowel Obstruction °A bowel obstruction means that something is blocking the small or large bowel. The bowel is also called the intestine. It is the long tube that connects the stomach to the opening of the butt (anus). When something blocks the bowel, food and fluids cannot pass through like normal. This condition needs to be treated. Treatment depends on the cause of the problem and how bad the problem is. °What are the causes? °Common causes of this condition include: °· Scar tissue (adhesions) from past surgery or from high-energy X-rays (radiation). °· Recent surgery in the belly. This affects how food moves in the bowel. °· Some diseases, such as: °? Irritation of the lining of the digestive tract (Crohn's disease). °? Irritation of small pouches in the bowel (diverticulitis). °· Growths or tumors. °· A bulging organ (hernia). °· Twisting of the bowel (volvulus). °· A foreign body. °· Slipping of a part of the bowel into another part (intussusception). °What are the signs or symptoms? °Symptoms of this condition include: °· Pain in the belly. °· Feeling sick to your stomach (nauseous). °· Throwing up (vomiting). °· Bloating in the belly. °· Being unable to pass gas. °· Trouble pooping (constipation). °· Watery poop (diarrhea). °· A lot of belching. °How is this diagnosed? °This condition may be diagnosed based on: °· A physical exam. °· Medical history. °· Imaging tests, such as X-ray or CT scan. °· Blood tests. °· Urine tests. °How is this treated? °Treatment for this condition may include: °· Fluids and pain medicines that are given through an IV tube. Your doctor may tell you not to eat or drink if you feel sick to your stomach and are throwing up. °· Eating a clear liquid diet for a few days. °· Putting a small tube (nasogastric tube) into the stomach. This will help with pain, discomfort, and nausea by removing blocked air and fluids from the stomach. °· Surgery. This may be needed if other treatments do  not work. °Follow these instructions at home: °Medicines °· Take over-the-counter and prescription medicines only as told by your doctor. °· If you were prescribed an antibiotic medicine, take it as told by your doctor. Do not stop taking the antibiotic even if you start to feel better. °General instructions °· Follow your diet as told by your doctor. You may need to: °? Only drink clear liquids until you start to get better. °? Avoid solid foods. °· Return to your normal activities as told by your doctor. Ask your doctor what activities are safe for you. °· Do not sit for a long time without moving. Get up to take short walks every 1-2 hours. This is important. Ask for help if you feel weak or unsteady. °· Keep all follow-up visits as told by your doctor. This is important. °How is this prevented? °After having a bowel obstruction, you may be more likely to have another. You can do some things to stop it from happening again. °· If you have a long-term (chronic) disease, contact your doctor if you see changes or problems. °· Take steps to prevent or treat trouble pooping. Your doctor may ask that you: °? Drink enough fluid to keep your pee (urine) pale yellow. °? Take over-the-counter or prescription medicines. °? Eat foods that are high in fiber. These include beans, whole grains, and fresh fruits and vegetables. °? Limit foods that are high in fat and sugar. These include fried or sweet foods. °· Stay active. Ask your doctor which exercises are   safe for you. °· Avoid stress. °· Eat three small meals and three small snacks each day. °· Work with a food expert (dietitian) to make a meal plan that works for you. °· Do not use any products that contain nicotine or tobacco, such as cigarettes and e-cigarettes. If you need help quitting, ask your doctor. °Contact a doctor if: °· You have a fever. °· You have chills. °Get help right away if: °· You have pain or cramps that get worse. °· You throw up blood. °· You are  sick to your stomach. °· You cannot stop throwing up. °· You cannot drink fluids. °· You feel mixed up (confused). °· You feel very thirsty (dehydrated). °· Your belly gets more bloated. °· You feel weak or you pass out (faint). °Summary °· A bowel obstruction means that something is blocking the small or large bowel. °· Treatment may include IV fluids and pain medicine. You may also have a clear liquid diet, a small tube in your stomach, or surgery. °· Drink clear liquids and avoid solid foods until you get better. °This information is not intended to replace advice given to you by your health care provider. Make sure you discuss any questions you have with your health care provider. °Document Released: 03/14/2004 Document Revised: 06/18/2017 Document Reviewed: 06/18/2017 °Elsevier Patient Education © 2020 [employer] ° °

## 2018-12-23 ENCOUNTER — Telehealth: Payer: Self-pay

## 2018-12-23 NOTE — Telephone Encounter (Signed)
HFU scheduled 01/01/19 @ 3:00 PM.

## 2018-12-23 NOTE — Telephone Encounter (Signed)
Transition Care Management Follow-up Telephone Call  Date of discharge and from where: [facility] on 12/22/18  How have you been since you were released from the hospital? Doing better but still very weak and gets SOB easy. Pt states she has not ate much which may be why she she is weak. Still having a lot of gas that causes abdominal pain. Declines fever or n/v/d.  Any questions or concerns? No  Items Reviewed:  Did the pt receive and understand the discharge instructions provided? Yes   Medications obtained and verified? Yes   Any new allergies since your discharge? No   Dietary orders reviewed? N/A  Do you have support at home? Yes   Other (ie: DME, Home Health, etc) N/A  Functional Questionnaire: (I = Independent and D = Dependent)  Bathing/Dressing- Needing assistance currently   Meal Prep- Not currently  Eating- I  Maintaining continence- I  Transferring/Ambulation- I  Managing Meds- I   Follow up appointments reviewed:    PCP Hospital f/u appt confirmed? Yes  Scheduled to see Dr [name] on 01/01/19 @ 3:00 PM.  [facility] f/u appt confirmed? Yes  Are transportation arrangements needed? No   If their condition worsens, is the pt aware to call  their PCP or go to the ED? Yes  Was the patient provided with contact information for the PCP's office or ED? Yes  Was the pt encouraged to call back with questions or concerns? Yes

## 2018-12-30 ENCOUNTER — Other Ambulatory Visit: Payer: Self-pay | Admitting: Family Medicine

## 2018-12-30 ENCOUNTER — Ambulatory Visit: Payer: Self-pay | Admitting: *Deleted

## 2018-12-30 DIAGNOSIS — F329 Major depressive disorder, single episode, unspecified: Secondary | ICD-10-CM

## 2018-12-30 DIAGNOSIS — F419 Anxiety disorder, unspecified: Secondary | ICD-10-CM

## 2018-12-31 NOTE — Patient Instructions (Signed)
Thank you allowing the Chronic Care Management Team to be a part of your care! It was a pleasure speaking with you today!  1. Please contact the [facility] to schedule an intake appointment. 2. Please call this social worker with any questions or concerns regarding your mental health needs.  CCM (Chronic Care Management) Team   Neldon Labella RN, BSN Nurse Care Coordinator  [phone]  Ruben Reason PharmD  Clinical Pharmacist  [phone]   Elliot Gurney, LCSW Clinical Social Worker [phone]  Goals Addressed            This Visit's Progress   . "I would like to manage my depression better" (pt-stated)       Current Barriers:  [name] Mental Health Concerns   Clinical Social Work Clinical Goal(s):  [name] Over the next 90 days, client will work with SW to address concerns related to depression  Interventions: . Followed up on referral options for local psychiatrist to further manage her anxiety . Patient states that she has been contacted from the  [facility] [phone], but has not returned the call . Patient discussed recent hospitalization and disappointment experienced due to level of family support received . Discussed importance of managing expectations and utilizing coping strategies to manage stress . [name] Strongly encouraged follow up with the [facility] . Discussed plans with patient for ongoing care management follow up and provided patient with direct contact information for care management team  Patient Self Care Activities:  . Performs ADL's independently . Performs IADL's independently . Calls provider office for new concerns or questions  Please see past updates related to this goal by clicking on the "Past Updates" button in the selected goal          The patient verbalized understanding of instructions provided today and declined a print copy of patient instruction materials.   Telephone follow  up appointment with care management team member scheduled for:01/11/19

## 2018-12-31 NOTE — Chronic Care Management (AMB) (Signed)
  Chronic Care Management    Clinical Social Work Follow Up Note  12/31/2018 Name: CRISTEN EGBERS MRN: [id number] DOB: 1945/10/19  [name] is a 73 y.o. year old female who is a primary care patient of [name], [name], MD. The CCM team was consulted for assistance with Mental Health Counseling and Resources.   Review of patient status, including review of consultants reports, other relevant assessments, and collaboration with appropriate care team members and the patient's provider was performed as part of comprehensive patient evaluation and provision of chronic care management services.    Advanced Directives Status: <no information> See Care Plan for related entries.   Outpatient Encounter Medications as of 12/30/2018  Medication Sig  . ALPRAZolam (XANAX) 0.5 MG tablet TAKE 1 TABLET (0.5 MG TOTAL) BY MOUTH 3 (THREE) TIMES DAILY AS NEEDED FOR ANXIETY. (Patient taking differently: Take 0.5 mg by mouth 2 (two) times daily as needed for anxiety or sleep. )  . aspirin EC 81 MG tablet Take 81 mg by mouth daily.  . citalopram (CELEXA) 10 MG tablet TAKE 1 TABLET BY MOUTH EVERY DAY  . fluticasone (FLONASE) 50 MCG/ACT nasal spray Place 2 sprays into both nostrils daily. (Patient taking differently: Place 1-2 sprays into both nostrils daily as needed for allergies or rhinitis. )  . ibuprofen (ADVIL) 800 MG tablet ONE TAB DAILY AS NEEDED. TRY TO MINIMIZE USE TO SEVERE PAIN. (Patient taking differently: Take 800 mg by mouth daily as needed for moderate pain. )  . omeprazole (PRILOSEC) 20 MG capsule Take 20 mg by mouth daily as needed (acid reflux).    No facility-administered encounter medications on file as of 12/30/2018.      Goals Addressed            This Visit's Progress   . "I would like to manage my depression better" (pt-stated)       Current Barriers:  [name] Mental Health Concerns   Clinical Social Work Clinical Goal(s):  [name] Over the next 90 days, client will work with SW to  address concerns related to depression  Interventions: . Followed up on referral options for local psychiatrist to further manage her anxiety . Patient states that she has been contacted from the  [facility] [phone], but has not returned the call . Patient discussed recent hospitalization and disappointment experienced due to level of family support received . Discussed importance of managing expectations and utilizing coping strategies to manage stress . [name] Strongly encouraged follow up with the [facility] . Discussed plans with patient for ongoing care management follow up and provided patient with direct contact information for care management team  Patient Self Care Activities:  . Performs ADL's independently . Performs IADL's independently . Calls provider office for new concerns or questions  Please see past updates related to this goal by clicking on the "Past Updates" button in the selected goal          Follow Up Plan: Appointment scheduled for SW follow up with client by phone on: 01/11/19    Elliot Gurney, [place] Worker  [facility] Care Management [phone]

## 2019-01-01 ENCOUNTER — Ambulatory Visit (INDEPENDENT_AMBULATORY_CARE_PROVIDER_SITE_OTHER): Payer: Medicare Other | Admitting: Family Medicine

## 2019-01-01 ENCOUNTER — Encounter: Payer: Self-pay | Admitting: Family Medicine

## 2019-01-01 ENCOUNTER — Other Ambulatory Visit: Payer: Self-pay

## 2019-01-01 VITALS — BP 135/82 | HR 76 | Temp 96.9°F | Wt 171.0 lb

## 2019-01-01 DIAGNOSIS — R030 Elevated blood-pressure reading, without diagnosis of hypertension: Secondary | ICD-10-CM
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
## 2019-01-12 ENCOUNTER — Telehealth: Payer: Self-pay

## 2019-01-12 NOTE — Telephone Encounter (Signed)
Patient advised pre-COVID that would have been fine, now with COVID she would need a appointment and be COVID prescreened before coming in the office. Patient verbalized understanding and states she will continue to monitor BP.

## 2019-01-12 NOTE — Telephone Encounter (Signed)
Copied from [place] [id number]. Topic: Appointment Scheduling - Scheduling Inquiry for Clinic >> Jan 12, 2019  9:39 AM [name] wrote: Patient would like to know if she is able to just "stop by office" to have blood pressure checked, as she thinks her home meter is not working. Please advise.

## 2019-02-04 ENCOUNTER — Ambulatory Visit: Payer: Self-pay | Admitting: *Deleted

## 2019-02-04 NOTE — Chronic Care Management (AMB) (Signed)
Chronic Care Management   Social Work Note  02/04/2019 Name: [name] MRN: [id number] DOB: 10/13/1945  Jeanne Ivan is a 73 y.o. year old female who sees [name], [name], MD for primary care. The CCM team was consulted for assistance with Mental Health Counseling and Resources   Patient was not able to engage at this time as patient was not at home at the time of the call. Patient briefly stated that she was doing well, however was dealing with her son being ill over the holiday. Patient agreed to a call to speak further within 2 weeks.   Outpatient Encounter Medications as of 02/04/2019  Medication Sig  . ALPRAZolam (XANAX) 0.5 MG tablet TAKE 1 TABLET (0.5 MG TOTAL) BY MOUTH 3 (THREE) TIMES DAILY AS NEEDED FOR ANXIETY. (Patient taking differently: Take 0.5 mg by mouth 2 (two) times daily as needed for anxiety or sleep. )  . aspirin EC 81 MG tablet Take 81 mg by mouth daily.  . citalopram (CELEXA) 10 MG tablet TAKE 1 TABLET BY MOUTH EVERY DAY  . fluticasone (FLONASE) 50 MCG/ACT nasal spray Place 2 sprays into both nostrils daily. (Patient taking differently: Place 1-2 sprays into both nostrils daily as needed for allergies or rhinitis. )  . ibuprofen (ADVIL) 800 MG tablet ONE TAB DAILY AS NEEDED. TRY TO MINIMIZE USE TO SEVERE PAIN. (Patient taking differently: Take 800 mg by mouth daily as needed for moderate pain. )  . omeprazole (PRILOSEC) 20 MG capsule Take 20 mg by mouth daily as needed (acid reflux).    No facility-administered encounter medications on file as of 02/04/2019.    Goals Addressed   None     Follow Up Plan: SW will follow up with patient by phone over the next 2 weeks   Niylah Hassan, LCSW Clinical Social Worker  [facility] Care Management [phone]

## 2019-02-05 ENCOUNTER — Other Ambulatory Visit: Payer: Self-pay | Admitting: Family Medicine

## 2019-02-17 ENCOUNTER — Ambulatory Visit (INDEPENDENT_AMBULATORY_CARE_PROVIDER_SITE_OTHER): Payer: Medicare Other | Admitting: *Deleted

## 2019-02-17 DIAGNOSIS — F419 Anxiety disorder, unspecified: Secondary | ICD-10-CM

## 2019-02-17 DIAGNOSIS — F329 Major depressive disorder, single episode, unspecified: Secondary | ICD-10-CM | POA: Diagnosis not present

## 2019-02-17 NOTE — Patient Instructions (Signed)
Thank you allowing the Chronic Care Management Team to be a part of your care! It was a pleasure speaking with you today!  1. Please call this social worker with any questions or concerns regarding your mental health needs  CCM (Chronic Care Management) Team   Neldon Labella RN, BSN Nurse Care Coordinator  [phone]  Ruben Reason PharmD  Clinical Pharmacist  [phone]   [place], LCSW Clinical Social Worker [phone]  Goals Addressed            This Visit's Progress   . "I would like to manage my depression better" (pt-stated)       Current Barriers:  [name] Mental Health Concerns   Clinical Social Work Clinical Goal(s):  [name] Over the next 90 days, client will work with SW to address concerns related to depression  Interventions: . Followed up on referral options for local psychiatrist to further manage her anxiety . Patient states that she has been contacted from the  [place] [facility] [phone], "I think they left me a message" but has not returned the call . Patient discussed need to have surgery but is reconsidering having the surgery due to concerns of a negative result . Discussed pros and cons of the surgery and discussing concerns with her physician  . Explored coping strategies for anxiety including adjusting her perspective and discussing concerns with her doctor in order to make an informed decision . Strongly encouraged follow up with the [facility] for ongoing therapy for her anxiety . Discussed plans with patient for ongoing care management follow up and provided patient with direct contact information for care management team  Patient Self Care Activities:  . Performs ADL's independently . Performs IADL's independently . Calls provider office for new concerns or questions  Please see past updates related to this goal by clicking on the "Past Updates" button in the selected goal          The patient  verbalized understanding of instructions provided today and declined a print copy of patient instruction materials.   The care management team will reach out to the patient again over the next 30 days.

## 2019-02-17 NOTE — Chronic Care Management (AMB) (Signed)
  Chronic Care Management    Clinical Social Work Follow Up Note  02/17/2019 Name: [name] MRN: [id number] DOB: [birth year]  [name] is a 73 y.o. year old female who is a primary care patient of [name], Wanda Peri, MD. The CCM team was consulted for assistance with Mental Health Counseling and Resources.   Review of patient status, including review of consultants reports, other relevant assessments, and collaboration with appropriate care team members and the patient's provider was performed as part of comprehensive patient evaluation and provision of chronic care management services.     Advanced Directives Status: <no information> See Care Plan for related entries.   Outpatient Encounter Medications as of 02/17/2019  Medication Sig  . ALPRAZolam (XANAX) 0.5 MG tablet TAKE 1 TABLET (0.5 MG TOTAL) BY MOUTH 3 (THREE) TIMES DAILY AS NEEDED FOR ANXIETY. (Patient taking differently: Take 0.5 mg by mouth 2 (two) times daily as needed for anxiety or sleep. )  . aspirin EC 81 MG tablet Take 81 mg by mouth daily.  . citalopram (CELEXA) 10 MG tablet TAKE 1 TABLET BY MOUTH EVERY DAY  . fluticasone (FLONASE) 50 MCG/ACT nasal spray Place 1-2 sprays into both nostrils daily as needed for allergies or rhinitis.  [name] ibuprofen (ADVIL) 800 MG tablet ONE TAB DAILY AS NEEDED. TRY TO MINIMIZE USE TO SEVERE PAIN. (Patient taking differently: Take 800 mg by mouth daily as needed for moderate pain. )  . omeprazole (PRILOSEC) 20 MG capsule Take 20 mg by mouth daily as needed (acid reflux).    No facility-administered encounter medications on file as of 02/17/2019.     Goals Addressed            This Visit's Progress   . "I would like to manage my depression better" (pt-stated)       Current Barriers:  [name] Mental Health Concerns   Clinical Social Work Clinical Goal(s):  [name] Over the next 90 days, client will work with SW to address concerns related to depression  Interventions: . Followed up on  referral options for local psychiatrist to further manage her anxiety . Patient states that she has been contacted from the  [place] [phone], "I think they left me a message" but has not returned the call . Patient discussed need to have surgery but is reconsidering having the surgery due to concerns of a negative result . Discussed pros and cons of the surgery and discussing concerns with her physician  . Explored coping strategies for anxiety including adjusting her perspective and discussing concerns with her doctor in order to make an informed decision . Strongly encouraged follow up with the [facility] for ongoing therapy for her anxiety . Discussed plans with patient for ongoing care management follow up and provided patient with direct contact information for care management team  Patient Self Care Activities:  . Performs ADL's independently . Performs IADL's independently . Calls provider office for new concerns or questions  Please see past updates related to this goal by clicking on the "Past Updates" button in the selected goal          Follow Up Plan: SW will follow up with patient by phone over the next 30 days    [place], [place] Worker  [facility] Care Management [phone]

## 2019-03-17 ENCOUNTER — Ambulatory Visit: Payer: Self-pay | Admitting: *Deleted

## 2019-03-17 ENCOUNTER — Telehealth: Payer: Self-pay | Admitting: *Deleted

## 2019-03-17 NOTE — Chronic Care Management (AMB) (Signed)
  Chronic Care Management    Clinical Social Work Follow Up Note  03/17/2019 Name: [name] MRN: [id number] DOB: 03/14/1945  [name] is a 74 y.o. year old female who is a primary care patient of [name], [name], MD. The CCM team was consulted for assistance with Mental Health Counseling and Resources.   Follow up call to patient. Patient received another call and stated that she would call this social worker back later today.  Review of patient status, including review of consultants reports, other relevant assessments, and collaboration with appropriate care team members and the patient's provider was performed as part of comprehensive patient evaluation and provision of chronic care management services.    Advanced Directives Status: <no information> See Care Plan for related entries.   Outpatient Encounter Medications as of 03/17/2019  Medication Sig  . ALPRAZolam (XANAX) 0.5 MG tablet TAKE 1 TABLET (0.5 MG TOTAL) BY MOUTH 3 (THREE) TIMES DAILY AS NEEDED FOR ANXIETY. (Patient taking differently: Take 0.5 mg by mouth 2 (two) times daily as needed for anxiety or sleep. )  . aspirin EC 81 MG tablet Take 81 mg by mouth daily.  . citalopram (CELEXA) 10 MG tablet TAKE 1 TABLET BY MOUTH EVERY DAY  . fluticasone (FLONASE) 50 MCG/ACT nasal spray Place 1-2 sprays into both nostrils daily as needed for allergies or rhinitis.  [name] ibuprofen (ADVIL) 800 MG tablet ONE TAB DAILY AS NEEDED. TRY TO MINIMIZE USE TO SEVERE PAIN. (Patient taking differently: Take 800 mg by mouth daily as needed for moderate pain. )  . omeprazole (PRILOSEC) 20 MG capsule Take 20 mg by mouth daily as needed (acid reflux).    No facility-administered encounter medications on file as of 03/17/2019.     Goals Addressed   None      Follow Up Plan: SW will follow up with patient by phone over the next 7-10 business days    [place], [place] Worker  [facility] Care  Management [phone]

## 2019-03-24 ENCOUNTER — Telehealth: Payer: Self-pay

## 2019-03-25 ENCOUNTER — Ambulatory Visit (INDEPENDENT_AMBULATORY_CARE_PROVIDER_SITE_OTHER): Payer: Medicare Other | Admitting: *Deleted

## 2019-03-25 DIAGNOSIS — F329 Major depressive disorder, single episode, unspecified: Secondary | ICD-10-CM | POA: Diagnosis not present

## 2019-03-25 DIAGNOSIS — F419 Anxiety disorder, unspecified: Secondary | ICD-10-CM

## 2019-03-25 NOTE — Patient Instructions (Signed)
Thank you allowing the Chronic Care Management Team to be a part of your care! It was a pleasure speaking with you today!  1. Please call this social worker with any questions or concerns regarding your mental health needs  CCM (Chronic Care Management) Team   Neldon Labella RN, BSN Nurse Care Coordinator  [phone]  Ruben Reason PharmD  Clinical Pharmacist  [phone]   [place], LCSW Clinical Social Worker [phone]  Goals Addressed            This Visit's Progress   . "I would like to manage my depression better" (pt-stated)       Current Barriers:  [name] Mental Health Concerns   Clinical Social Work Clinical Goal(s):  [name] Over the next 90 days, client will work with SW to address concerns related to depression  Interventions: . Followed up with patient in regards to her management of stress and depression . Patient confirmed letting some things go they were causing her increased stress which seemed to be affecting her health . Patient discussed having talks with her family discussing their care and concern for her health and need to "slow down" in regards to some of her activities . Patient discussed plan to stop working at this time . Processed patient's feelings regarding her decision to quit her job to take better care of herself . Provided patient with positive reinforcement for prioritizing self care . Discussed plans with patient for ongoing care management follow up and provided patient with direct contact information for care management team  Patient Self Care Activities:  . Performs ADL's independently . Performs IADL's independently . Calls provider office for new concerns or questions  Please see past updates related to this goal by clicking on the "Past Updates" button in the selected goal          The patient verbalized understanding of instructions provided today and declined a print copy of patient instruction materials.    Telephone follow up appointment with care management team member scheduled for: 04/22/19

## 2019-03-25 NOTE — Chronic Care Management (AMB) (Signed)
  Chronic Care Management    Clinical Social Work Follow Up Note  03/25/2019 Name: [name] MRN: [id number] DOB: 1945/12/12  [name] is a 74 y.o. year old female who is a primary care patient of [name], [name], MD. The CCM team was consulted for assistance with Mental Health Counseling and Resources.   Review of patient status, including review of consultants reports, other relevant assessments, and collaboration with appropriate care team members and the patient's provider was performed as part of comprehensive patient evaluation and provision of chronic care management services.     Advanced Directives Status: <no information> See Care Plan for related entries.   Outpatient Encounter Medications as of 03/25/2019  Medication Sig  . ALPRAZolam (XANAX) 0.5 MG tablet TAKE 1 TABLET (0.5 MG TOTAL) BY MOUTH 3 (THREE) TIMES DAILY AS NEEDED FOR ANXIETY. (Patient taking differently: Take 0.5 mg by mouth 2 (two) times daily as needed for anxiety or sleep. )  . aspirin EC 81 MG tablet Take 81 mg by mouth daily.  . citalopram (CELEXA) 10 MG tablet TAKE 1 TABLET BY MOUTH EVERY DAY  . fluticasone (FLONASE) 50 MCG/ACT nasal spray Place 1-2 sprays into both nostrils daily as needed for allergies or rhinitis.  [name] ibuprofen (ADVIL) 800 MG tablet ONE TAB DAILY AS NEEDED. TRY TO MINIMIZE USE TO SEVERE PAIN. (Patient taking differently: Take 800 mg by mouth daily as needed for moderate pain. )  . omeprazole (PRILOSEC) 20 MG capsule Take 20 mg by mouth daily as needed (acid reflux).    No facility-administered encounter medications on file as of 03/25/2019.     Goals Addressed            This Visit's Progress   . "I would like to manage my depression better" (pt-stated)       Current Barriers:  [name] Mental Health Concerns   Clinical Social Work Clinical Goal(s):  [name] Over the next 90 days, client will work with SW to address concerns related to depression  Interventions: . Followed up with  patient in regards to her management of stress and depression . Patient confirmed letting some things go they were causing her increased stress which seemed to be affecting her health . Patient discussed having talks with her family discussing their care and concern for her health and need to "slow down" in regards to some of her activities . Patient discussed plan to stop working at this time . Processed patient's feelings regarding her decision to quit her job to take better care of herself . Provided patient with positive reinforcement for prioritizing self care . Discussed plans with patient for ongoing care management follow up and provided patient with direct contact information for care management team  Patient Self Care Activities:  . Performs ADL's independently . Performs IADL's independently . Calls provider office for new concerns or questions  Please see past updates related to this goal by clicking on the "Past Updates" button in the selected goal          Follow Up Plan: SW will follow up with patient by phone over the next 30 days    [place], [place] Worker  [facility] Care Management [phone]

## 2019-03-29 ENCOUNTER — Other Ambulatory Visit: Payer: Self-pay | Admitting: Family Medicine

## 2019-03-29 DIAGNOSIS — G8929 Other chronic pain: Secondary | ICD-10-CM

## 2019-04-13 ENCOUNTER — Telehealth: Payer: Self-pay | Admitting: Family Medicine

## 2019-04-13 DIAGNOSIS — M545 Low back pain, unspecified: Secondary | ICD-10-CM

## 2019-04-13 MED ORDER — HYDROCODONE-ACETAMINOPHEN 5-325 MG PO TABS: 1.0000 | ORAL_TABLET | Freq: Three times a day (TID) | ORAL | 0 refills | Status: DC | PRN

## 2019-04-13 NOTE — Telephone Encounter (Signed)
Copied from [place] [id number]. Topic: General - Other >> Apr 13, 2019 12:10 PM Keene Breath wrote: Reason for CRM: Patient called to request a refill for her hydrocodone medication for her back.  Patient stated that she has not used it in a while but is having severe back pain.  Please advise and call back to confirm at [phone]

## 2019-04-26 DIAGNOSIS — H52223 Regular astigmatism, bilateral: Secondary | ICD-10-CM | POA: Diagnosis not present

## 2019-04-26 DIAGNOSIS — H2513 Age-related nuclear cataract, bilateral: Secondary | ICD-10-CM | POA: Diagnosis not present

## 2019-04-26 DIAGNOSIS — H5203 Hypermetropia, bilateral: Secondary | ICD-10-CM | POA: Diagnosis not present

## 2019-04-26 DIAGNOSIS — H10233 Serous conjunctivitis, except viral, bilateral: Secondary | ICD-10-CM | POA: Diagnosis not present

## 2019-04-26 DIAGNOSIS — H524 Presbyopia: Secondary | ICD-10-CM | POA: Diagnosis not present

## 2019-05-09 ENCOUNTER — Other Ambulatory Visit: Payer: Self-pay | Admitting: Family Medicine

## 2019-05-09 NOTE — Telephone Encounter (Signed)
Requested Prescriptions  Pending Prescriptions Disp Refills  . fluticasone (FLONASE) 50 MCG/ACT nasal spray [Pharmacy Med Name: FLUTICASONE PROP 50 MCG SPRAY] 48 mL 1    Sig: PLACE 1-2 SPRAYS INTO BOTH NOSTRILS DAILY AS NEEDED FOR ALLERGIES OR RHINITIS.     Ear, Nose, and Throat: Nasal Preparations - Corticosteroids Passed - 05/09/2019  2:02 AM      Passed - Valid encounter within last 12 months    Recent Outpatient Visits          4 months ago Elevated blood pressure reading   [facility] Birdie Sons, MD   7 months ago Major depressive disorder with single episode, remission status unspecified   [facility] Birdie Sons, MD   8 months ago [place], Donald E, MD   9 months ago Vitamin D deficiency   [facility] Birdie Sons, MD   9 months ago Acute recurrent pansinusitis   [facility], Dionne Bucy, MD

## 2019-05-18 ENCOUNTER — Other Ambulatory Visit: Payer: Self-pay | Admitting: Physician Assistant

## 2019-05-18 DIAGNOSIS — J40 Bronchitis, not specified as acute or chronic: Secondary | ICD-10-CM

## 2019-05-18 NOTE — Telephone Encounter (Signed)
Attempted to call pt to discuss need for inhaler. Mailbox full. Routing to office.

## 2019-05-27 ENCOUNTER — Telehealth: Payer: Self-pay

## 2019-05-27 NOTE — Telephone Encounter (Signed)
Copied from [place] [id number]. Topic: General - Inquiry >> May 27, 2019  9:14 AM Virl A[place] D wrote: Reason for CRM: Pt stated she has gotten a puppy as an emotional support animal. She is requesting a letter from Dr. [name]. It is for her anxiety. Please advise.

## 2019-06-13 ENCOUNTER — Other Ambulatory Visit: Payer: Self-pay | Admitting: Family Medicine

## 2019-06-13 NOTE — Telephone Encounter (Signed)
Requested medication (s) are due for refill today: yes  Requested medication (s) are on the active medication list: yes  Last refill:  10/27/18  Future visit scheduled: no  Notes to clinic:  medication not delegated to NT to refill   Requested Prescriptions  Pending Prescriptions Disp Refills   ALPRAZolam (XANAX) 0.5 MG tablet [Pharmacy Med Name: ALPRAZOLAM 0.5 MG TABLET] 90 tablet     Sig: TAKE 1 TABLET (0.5 MG TOTAL) BY MOUTH 3 (THREE) TIMES DAILY AS NEEDED FOR ANXIETY.      Not Delegated - Psychiatry:  Anxiolytics/Hypnotics Failed - 06/13/2019  3:40 PM      Failed - This refill cannot be delegated      Failed - Urine Drug Screen completed in last 360 days.      Passed - Valid encounter within last 6 months    Recent Outpatient Visits           5 months ago Elevated blood pressure reading   [facility] Birdie Sons, MD   8 months ago Major depressive disorder with single episode, remission status unspecified   [facility] Birdie Sons, MD   9 months ago [place], Donald E, MD   10 months ago Vitamin D deficiency   [place], MD   10 months ago Acute recurrent pansinusitis   [facility], Dionne Bucy, MD

## 2019-06-14 ENCOUNTER — Other Ambulatory Visit: Payer: Self-pay | Admitting: Family Medicine

## 2019-06-14 DIAGNOSIS — J40 Bronchitis, not specified as acute or chronic: Secondary | ICD-10-CM

## 2019-06-14 NOTE — Telephone Encounter (Signed)
Patient got this refilled on 05/19/19.

## 2019-06-14 NOTE — Telephone Encounter (Signed)
Requested medication (s) are due for refill today: no  Requested medication (s) are on the active medication list: yes  Last refill:  05/18/2019  Future visit scheduled:no  Notes to clinic:  One inhaler should last at least one month. If the patient is requesting refills earlier, contact the patient to check for uncontrolled symptoms   Requested Prescriptions  Pending Prescriptions Disp Refills   albuterol (VENTOLIN HFA) 108 (90 Base) MCG/ACT inhaler [Pharmacy Med Name: ALBUTEROL HFA (PROAIR) INHALER]      Sig: TAKE 2 PUFFS BY MOUTH EVERY 6 HOURS AS NEEDED FOR WHEEZE OR SHORTNESS OF BREATH      Pulmonology:  Beta Agonists Failed - 06/14/2019  1:40 AM      Failed - One inhaler should last at least one month. If the patient is requesting refills earlier, contact the patient to check for uncontrolled symptoms.      Passed - Valid encounter within last 12 months    Recent Outpatient Visits           5 months ago Elevated blood pressure reading   [facility] Birdie Sons, MD   8 months ago Major depressive disorder with single episode, remission status unspecified   [facility] Birdie Sons, MD   9 months ago [place], Donald E, MD   10 months ago Vitamin D deficiency   [place], MD   10 months ago Acute recurrent pansinusitis   [facility] [place], Dionne Bucy, MD

## 2019-06-30 ENCOUNTER — Ambulatory Visit: Payer: Self-pay | Admitting: *Deleted

## 2019-06-30 ENCOUNTER — Telehealth: Payer: Self-pay | Admitting: *Deleted

## 2019-06-30 DIAGNOSIS — F419 Anxiety disorder, unspecified: Secondary | ICD-10-CM

## 2019-06-30 DIAGNOSIS — F329 Major depressive disorder, single episode, unspecified: Secondary | ICD-10-CM

## 2019-06-30 NOTE — Patient Instructions (Addendum)
Thank you allowing the Chronic Care Management Team to be a part of your care! It was a pleasure speaking with you today!  1. Please call this social worker with any questions or concerns regarding your mental health needs  CCM (Chronic Care Management) Team   Neldon Labella RN, BSN Nurse Care Coordinator  [phone]  [place], LCSW Clinical Social Worker [phone]  Goals Addressed            This Visit's Progress   . "I would like to manage my depression better" (pt-stated)       Current Barriers:  [name] Mental Health Concerns   Clinical Social Work Clinical Goal(s):  [name] Over the next 90 days, client will work with SW to address concerns related to depression  Interventions: . Followed up with patient in regards to her management of stress and depression . Patient confirmed making efforts of letting some things go they were causing her increased stress and affecting her health but has begun to have panic attacks again . Patient discussed now having a puppy that helps manage her panic attacks . Patient discussed continued plan to stop working at this time to increase self care . Processed patient's feelings regarding specific relationships and interactions that can be harmful vs. Beneficial . Processed patient's relationship with brother and residential changes that need to be made . Provided patient with positive reinforcement for prioritizing self care . Discussed plans with patient for ongoing care management follow up and provided patient with direct contact information for care management team  Patient Self Care Activities:  . Performs ADL's independently . Performs IADL's independently . Calls provider office for new concerns or questions  Please see past updates related to this goal by clicking on the "Past Updates" button in the selected goal          The patient verbalized understanding of instructions provided today and declined a print copy of  patient instruction materials.   Telephone follow up appointment with care management team member scheduled for: 07/14/19

## 2019-06-30 NOTE — Chronic Care Management (AMB) (Signed)
  Chronic Care Management    Clinical Social Work Follow Up Note  06/30/2019 Name: [name] MRN: [id number] DOB: [birth year]  [name] is a 74 y.o. year old female who is a primary care patient of [name], [name], MD. The CCM team was consulted for assistance with Mental Health Counseling and Resources.   Review of patient status, including review of consultants reports, other relevant assessments, and collaboration with appropriate care team members and the patient's provider was performed as part of comprehensive patient evaluation and provision of chronic care management services.    SDOH (Social Determinants of Health) assessments performed: No    Outpatient Encounter Medications as of 06/30/2019  Medication Sig  . albuterol (VENTOLIN HFA) 108 (90 Base) MCG/ACT inhaler TAKE 2 PUFFS BY MOUTH EVERY 6 HOURS AS NEEDED FOR WHEEZE OR SHORTNESS OF BREATH  . ALPRAZolam (XANAX) 0.5 MG tablet Take 1 tablet (0.5 mg total) by mouth 3 (three) times daily as needed for anxiety or sleep.  [name] aspirin EC 81 MG tablet Take 81 mg by mouth daily.  . citalopram (CELEXA) 10 MG tablet TAKE 1 TABLET BY MOUTH EVERY DAY  . fluticasone (FLONASE) 50 MCG/ACT nasal spray PLACE 1-2 SPRAYS INTO BOTH NOSTRILS DAILY AS NEEDED FOR ALLERGIES OR RHINITIS.  . HYDROcodone-acetaminophen (NORCO/VICODIN) 5-325 MG tablet Take 1 tablet by mouth every 8 (eight) hours as needed for moderate pain.  [name] ibuprofen (ADVIL) 800 MG tablet ONE TABLET DAILY AS NEEDED. TRY TO MINIMIZE USE TO SEVERE PAIN.  [name] omeprazole (PRILOSEC) 20 MG capsule Take 20 mg by mouth daily as needed (acid reflux).    No facility-administered encounter medications on file as of 06/30/2019.     Goals Addressed            This Visit's Progress   . "I would like to manage my depression better" (pt-stated)       Current Barriers:  [name] Mental Health Concerns   Clinical Social Work Clinical Goal(s):  [name] Over the next 90 days, client will work with SW to  address concerns related to depression  Interventions: . Followed up with patient in regards to her management of stress and depression . Patient confirmed making efforts of letting some things go they were causing her increased stress and affecting her health but has begun to have panic attacks again . Patient discussed now having a puppy that helps manage her panic attacks . Patient discussed continued plan to stop working at this time to increase self care . Processed patient's feelings regarding specific relationships and interactions that can be harmful vs. Beneficial . Processed patient's relationship with brother and residential changes that need to be made . Provided patient with positive reinforcement for prioritizing self care . Discussed plans with patient for ongoing care management follow up and provided patient with direct contact information for care management team  Patient Self Care Activities:  . Performs ADL's independently . Performs IADL's independently . Calls provider office for new concerns or questions  Please see past updates related to this goal by clicking on the "Past Updates" button in the selected goal          Follow Up Plan: SW will follow up with patient by phone over the next 2 weeks    [place], [place] Worker  [facility] Care Management [phone]

## 2019-07-14 ENCOUNTER — Ambulatory Visit: Payer: Self-pay | Admitting: *Deleted

## 2019-07-14 DIAGNOSIS — F329 Major depressive disorder, single episode, unspecified: Secondary | ICD-10-CM

## 2019-07-14 DIAGNOSIS — F419 Anxiety disorder, unspecified: Secondary | ICD-10-CM

## 2019-07-15 NOTE — Patient Instructions (Signed)
Thank you allowing the Chronic Care Management Team to be a part of your care! It was a pleasure speaking with you today!  1. Please call this social worker with any questions or concerns regarding your mental health needs  CCM (Chronic Care Management) Team   Neldon Labella RN, BSN Nurse Care Coordinator  [phone]  [place], LCSW Clinical Social Worker [phone]  Goals Addressed            This Visit's Progress   . "I would like to manage my depression better" (pt-stated)       Current Barriers:  [name] Mental Health Concerns   Clinical Social Work Clinical Goal(s):  [name] Over the next 90 days, client will work with SW to address concerns related to depression  Interventions: . Followed up with patient in regards to her management of stress and depression . Patient discussed the recent  sudden death of her brother and being in the middle of making funeral arrangements . Patient discussed not being able to process brother's death right now as she often experiences a delayed response . Provided patient with positive reinforcement and emotional support related to the sudden death of her brother . Discussed plans with patient for ongoing care management follow up and provided patient with direct contact information for care management team  Patient Self Care Activities:  . Performs ADL's independently . Performs IADL's independently . Calls provider office for new concerns or questions  Please see past updates related to this goal by clicking on the "Past Updates" button in the selected goal          The patient verbalized understanding of instructions provided today and declined a print copy of patient instruction materials.   Telephone follow up appointment with care management team member scheduled for: 07/28/19

## 2019-07-15 NOTE — Chronic Care Management (AMB) (Signed)
  Chronic Care Management    Clinical Social Work Follow Up Note  07/15/2019 Name: CAPRIANA OTTINGER MRN: [id number] DOB: February 11, 1946  [name] is a 74 y.o. year old female who is a primary care patient of [name], [name], MD. The CCM team was consulted for assistance with Mental Health Counseling and Resources.   Review of patient status, including review of consultants reports, other relevant assessments, and collaboration with appropriate care team members and the patient's provider was performed as part of comprehensive patient evaluation and provision of chronic care management services.    SDOH (Social Determinants of Health) assessments performed: No    Outpatient Encounter Medications as of 07/14/2019  Medication Sig  . albuterol (VENTOLIN HFA) 108 (90 Base) MCG/ACT inhaler TAKE 2 PUFFS BY MOUTH EVERY 6 HOURS AS NEEDED FOR WHEEZE OR SHORTNESS OF BREATH  . ALPRAZolam (XANAX) 0.5 MG tablet Take 1 tablet (0.5 mg total) by mouth 3 (three) times daily as needed for anxiety or sleep.  [name] aspirin EC 81 MG tablet Take 81 mg by mouth daily.  . citalopram (CELEXA) 10 MG tablet TAKE 1 TABLET BY MOUTH EVERY DAY  . fluticasone (FLONASE) 50 MCG/ACT nasal spray PLACE 1-2 SPRAYS INTO BOTH NOSTRILS DAILY AS NEEDED FOR ALLERGIES OR RHINITIS.  . HYDROcodone-acetaminophen (NORCO/VICODIN) 5-325 MG tablet Take 1 tablet by mouth every 8 (eight) hours as needed for moderate pain.  [name] ibuprofen (ADVIL) 800 MG tablet ONE TABLET DAILY AS NEEDED. TRY TO MINIMIZE USE TO SEVERE PAIN.  [name] omeprazole (PRILOSEC) 20 MG capsule Take 20 mg by mouth daily as needed (acid reflux).    No facility-administered encounter medications on file as of 07/14/2019.     Goals Addressed            This Visit's Progress   . "I would like to manage my depression better" (pt-stated)       Current Barriers:  [name] Mental Health Concerns   Clinical Social Work Clinical Goal(s):  [name] Over the next 90 days, client will work with SW to  address concerns related to depression  Interventions: . Followed up with patient in regards to her management of stress and depression . Patient discussed the recent  sudden death of her brother and being in the middle of making funeral arrangements . Patient discussed not being able to process brother's death right now as she often experiences a delayed response . Provided patient with positive reinforcement and emotional support related to the sudden death of her brother . Discussed plans with patient for ongoing care management follow up and provided patient with direct contact information for care management team  Patient Self Care Activities:  . Performs ADL's independently . Performs IADL's independently . Calls provider office for new concerns or questions  Please see past updates related to this goal by clicking on the "Past Updates" button in the selected goal          Follow Up Plan: SW will follow up with patient by phone over the next 7-14 days    [place], [place] Worker  [facility] Care [facility] [phone]

## 2019-07-28 ENCOUNTER — Ambulatory Visit: Payer: Self-pay | Admitting: *Deleted

## 2019-07-28 NOTE — Chronic Care Management (AMB) (Signed)
  Chronic Care Management   Social Work Note  07/28/2019 Name: [name] MRN: [id number] DOB: 12-04-1945  [name] is a 74 y.o. year old female who sees [name], [name], MD for primary care. The CCM team was consulted for assistance with Mental Health Counseling and Resources.   Follow up phone call to patient related to the recent death of her brother. Per patient, she was on her way to [facility] and requested a cll back next week.  SDOH (Social Determinants of Health) assessments performed: No     Outpatient Encounter Medications as of 07/28/2019  Medication Sig  . albuterol (VENTOLIN HFA) 108 (90 Base) MCG/ACT inhaler TAKE 2 PUFFS BY MOUTH EVERY 6 HOURS AS NEEDED FOR WHEEZE OR SHORTNESS OF BREATH  . ALPRAZolam (XANAX) 0.5 MG tablet Take 1 tablet (0.5 mg total) by mouth 3 (three) times daily as needed for anxiety or sleep.  [name] aspirin EC 81 MG tablet Take 81 mg by mouth daily.  . citalopram (CELEXA) 10 MG tablet TAKE 1 TABLET BY MOUTH EVERY DAY  . fluticasone (FLONASE) 50 MCG/ACT nasal spray PLACE 1-2 SPRAYS INTO BOTH NOSTRILS DAILY AS NEEDED FOR ALLERGIES OR RHINITIS.  . HYDROcodone-acetaminophen (NORCO/VICODIN) 5-325 MG tablet Take 1 tablet by mouth every 8 (eight) hours as needed for moderate pain.  [name] ibuprofen (ADVIL) 800 MG tablet ONE TABLET DAILY AS NEEDED. TRY TO MINIMIZE USE TO SEVERE PAIN.  [name] omeprazole (PRILOSEC) 20 MG capsule Take 20 mg by mouth daily as needed (acid reflux).    No facility-administered encounter medications on file as of 07/28/2019.    Goals Addressed   None     Follow Up Plan: SW will follow up with patient by phone over the next 7 business days   [place], [place] Worker  [facility] Care [facility] [phone]

## 2019-07-30 ENCOUNTER — Other Ambulatory Visit: Payer: Self-pay | Admitting: Family Medicine

## 2019-07-30 DIAGNOSIS — G8929 Other chronic pain: Secondary | ICD-10-CM

## 2019-08-04 ENCOUNTER — Ambulatory Visit (INDEPENDENT_AMBULATORY_CARE_PROVIDER_SITE_OTHER): Payer: Medicare Other | Admitting: *Deleted

## 2019-08-04 DIAGNOSIS — F419 Anxiety disorder, unspecified: Secondary | ICD-10-CM

## 2019-08-04 DIAGNOSIS — F329 Major depressive disorder, single episode, unspecified: Secondary | ICD-10-CM

## 2019-08-04 NOTE — Chronic Care Management (AMB) (Signed)
  Chronic Care Management    Clinical Social Work Follow Up Note  08/04/2019 Name: [name] MRN: [id number] DOB: [birth year]  [name] is a 74 y.o. year old female who is a primary care patient of [name], Wanda Peri, MD. The CCM team was consulted for assistance with Mental Health Counseling and Resources.   Review of patient status, including review of consultants reports, other relevant assessments, and collaboration with appropriate care team members and the patient's provider was performed as part of comprehensive patient evaluation and provision of chronic care management services.    SDOH (Social Determinants of Health) assessments performed: No    Outpatient Encounter Medications as of 08/04/2019  Medication Sig  . albuterol (VENTOLIN HFA) 108 (90 Base) MCG/ACT inhaler TAKE 2 PUFFS BY MOUTH EVERY 6 HOURS AS NEEDED FOR WHEEZE OR SHORTNESS OF BREATH  . ALPRAZolam (XANAX) 0.5 MG tablet Take 1 tablet (0.5 mg total) by mouth 3 (three) times daily as needed for anxiety or sleep.  [name] aspirin EC 81 MG tablet Take 81 mg by mouth daily.  . citalopram (CELEXA) 10 MG tablet TAKE 1 TABLET BY MOUTH EVERY DAY  . fluticasone (FLONASE) 50 MCG/ACT nasal spray PLACE 1-2 SPRAYS INTO BOTH NOSTRILS DAILY AS NEEDED FOR ALLERGIES OR RHINITIS.  . HYDROcodone-acetaminophen (NORCO/VICODIN) 5-325 MG tablet Take 1 tablet by mouth every 8 (eight) hours as needed for moderate pain.  [name] ibuprofen (ADVIL) 800 MG tablet ONE TABLET DAILY AS NEEDED. TRY TO MINIMIZE USE TO SEVERE PAIN.  [name] omeprazole (PRILOSEC) 20 MG capsule Take 20 mg by mouth daily as needed (acid reflux).    No facility-administered encounter medications on file as of 08/04/2019.     Goals Addressed              This Visit's Progress   .  "I would like to manage my depression better" (pt-stated)        Current Barriers:  [name] Mental Health Concerns   Clinical Social Work Clinical Goal(s):  [name] Over the next 90 days, client will work with SW  to address concerns related to depression  Interventions: . Followed up with patient in regards to her management of stress and depression . Patient continues to process her brother's death . Patient discussed funeral arrangements made based on her brother's wishers  . Provided patient with positive reinforcement and emotional support related to the sudden death of her brother and use of positive coping . Recommended referral to Grief Counseling which patient declined at this time . Discussed plans with patient for ongoing care management follow up and provided patient with direct contact information for care management team  Patient Self Care Activities:  . Performs ADL's independently . Performs IADL's independently . Calls provider office for new concerns or questions  Please see past updates related to this goal by clicking on the "Past Updates" button in the selected goal          Follow Up Plan: SW will follow up with patient by phone over the next 7-24 business days    [name], [place] Worker  [name] [phone]

## 2019-08-04 NOTE — Patient Instructions (Signed)
Thank you allowing the Chronic Care Management Team to be a part of your care! It was a pleasure speaking with you today!  1. Please consider referral to Grief Counseling as you continue to process the loss of your family member  CCM (Chronic Care Management) Team   Neldon Labella RN, BSN Nurse Care Coordinator  [phone]   [place], LCSW Clinical Social Worker [phone]  Goals Addressed              This Visit's Progress   .  "I would like to manage my depression better" (pt-stated)        Current Barriers:  [name] Mental Health Concerns   Clinical Social Work Clinical Goal(s):  [name] Over the next 90 days, client will work with SW to address concerns related to depression  Interventions: . Followed up with patient in regards to her management of stress and depression . Patient continues to process her brother's death . Patient discussed funeral arrangements made based on her brother's wishers  . Provided patient with positive reinforcement and emotional support related to the sudden death of her brother and use of positive coping . Recommended referral to Grief Counseling which patient declined at this time . Discussed plans with patient for ongoing care management follow up and provided patient with direct contact information for care management team  Patient Self Care Activities:  . Performs ADL's independently . Performs IADL's independently . Calls provider office for new concerns or questions  Please see past updates related to this goal by clicking on the "Past Updates" button in the selected goal          The patient verbalized understanding of instructions provided today and declined a print copy of patient instruction materials.   Telephone follow up appointment with care management team member scheduled for: 08/18/19

## 2019-08-18 ENCOUNTER — Ambulatory Visit: Payer: Self-pay | Admitting: *Deleted

## 2019-08-18 DIAGNOSIS — F419 Anxiety disorder, unspecified: Secondary | ICD-10-CM

## 2019-08-18 DIAGNOSIS — F329 Major depressive disorder, single episode, unspecified: Secondary | ICD-10-CM | POA: Diagnosis not present

## 2019-08-18 NOTE — Chronic Care Management (AMB) (Signed)
  Chronic Care Management    Clinical Social Work Follow Up Note  08/18/2019 Name: [name] MRN: [id number] DOB: Oct 03, 1945  [name] is a 74 y.o. year old female who is a primary care patient of [name], [name], MD. The CCM team was consulted for assistance with Mental Health Counseling and Resources.   Review of patient status, including review of consultants reports, other relevant assessments, and collaboration with appropriate care team members and the patient's provider was performed as part of comprehensive patient evaluation and provision of chronic care management services.    SDOH (Social Determinants of Health) assessments performed: No    Outpatient Encounter Medications as of 08/18/2019  Medication Sig  . albuterol (VENTOLIN HFA) 108 (90 Base) MCG/ACT inhaler TAKE 2 PUFFS BY MOUTH EVERY 6 HOURS AS NEEDED FOR WHEEZE OR SHORTNESS OF BREATH  . ALPRAZolam (XANAX) 0.5 MG tablet Take 1 tablet (0.5 mg total) by mouth 3 (three) times daily as needed for anxiety or sleep.  [name] aspirin EC 81 MG tablet Take 81 mg by mouth daily.  . citalopram (CELEXA) 10 MG tablet TAKE 1 TABLET BY MOUTH EVERY DAY  . fluticasone (FLONASE) 50 MCG/ACT nasal spray PLACE 1-2 SPRAYS INTO BOTH NOSTRILS DAILY AS NEEDED FOR ALLERGIES OR RHINITIS.  . HYDROcodone-acetaminophen (NORCO/VICODIN) 5-325 MG tablet Take 1 tablet by mouth every 8 (eight) hours as needed for moderate pain.  [name] ibuprofen (ADVIL) 800 MG tablet ONE TABLET DAILY AS NEEDED. TRY TO MINIMIZE USE TO SEVERE PAIN.  [name] omeprazole (PRILOSEC) 20 MG capsule Take 20 mg by mouth daily as needed (acid reflux).    No facility-administered encounter medications on file as of 08/18/2019.     Goals Addressed              This Visit's Progress   .  "I would like to manage my depression better" (pt-stated)        Current Barriers:  [name] Mental Health Concerns   Clinical Social Work Clinical Goal(s):  [name] Over the next 90 days, client will work with SW  to address concerns related to depression  Interventions: . Followed up with patient in regards to her management of stress and depression . Patient continues to process her brother's death . Provided patient with positive reinforcement and emotional support related to the sudden death of her brother and use of positive coping including letting negative relationships go that were not healthy for her . Recommended referral to Grief Counseling which patient continues to decline at this time . Discussed plans with patient for ongoing care management follow up and provided patient with direct contact information for care management team  Patient Self Care Activities:  . Performs ADL's independently . Performs IADL's independently . Calls provider office for new concerns or questions  Please see past updates related to this goal by clicking on the "Past Updates" button in the selected goal          Follow Up Plan: SW will follow up with patient by phone over the next 7-14 days      [place], [place] Worker  [facility] [phone]

## 2019-08-18 NOTE — Patient Instructions (Signed)
Thank you allowing the Chronic Care Management Team to be a part of your care! It was a pleasure speaking with you today!  1. Please call this social worker with any questions or concerns regarding your mental health needs.  CCM (Chronic Care Management) Team   Neldon Labella RN, BSN Nurse Care Coordinator  [phone]   [place], LCSW Clinical Social Worker [phone]  Goals Addressed              This Visit's Progress   .  "I would like to manage my depression better" (pt-stated)        Current Barriers:  [name] Mental Health Concerns   Clinical Social Work Clinical Goal(s):  [name] Over the next 90 days, client will work with SW to address concerns related to depression  Interventions: . Followed up with patient in regards to her management of stress and depression . Patient continues to process her brother's death . Provided patient with positive reinforcement and emotional support related to the sudden death of her brother and use of positive coping including letting negative relationships go that were not healthy for her . Recommended referral to Grief Counseling which patient continues to decline at this time . Discussed plans with patient for ongoing care management follow up and provided patient with direct contact information for care management team  Patient Self Care Activities:  . Performs ADL's independently . Performs IADL's independently . Calls provider office for new concerns or questions  Please see past updates related to this goal by clicking on the "Past Updates" button in the selected goal          The patient verbalized understanding of instructions provided today and declined a print copy of patient instruction materials.   Telephone follow up appointment with care management team member scheduled for: 09/01/19

## 2019-08-21 ENCOUNTER — Other Ambulatory Visit: Payer: Self-pay | Admitting: Family Medicine

## 2019-08-21 DIAGNOSIS — G8929 Other chronic pain: Secondary | ICD-10-CM

## 2019-08-21 NOTE — Telephone Encounter (Signed)
Requested medication (s) are due for refill today: yes  Requested medication (s) are on the active medication list: yes  Last refill:  06/14/19  Future visit scheduled: no  Notes to clinic:  med not delegated to NT to RF Requested Prescriptions  Pending Prescriptions Disp Refills   ALPRAZolam (XANAX) 0.5 MG tablet [Pharmacy Med Name: ALPRAZOLAM 0.5 MG TABLET] 90 tablet     Sig: TAKE 1 TABLET (0.5 MG TOTAL) BY MOUTH 3 (THREE) TIMES DAILY AS NEEDED FOR ANXIETY OR SLEEP.      Not Delegated - Psychiatry:  Anxiolytics/Hypnotics Failed - 08/21/2019  6:00 PM      Failed - This refill cannot be delegated      Failed - Urine Drug Screen completed in last 360 days.      Failed - Valid encounter within last 6 months    Recent Outpatient Visits           7 months ago Elevated blood pressure reading   [facility] Birdie Sons, MD   10 months ago Major depressive disorder with single episode, remission status unspecified   [facility] Birdie Sons, MD   11 months ago [place], Donald E, MD   1 year ago Vitamin D deficiency   [facility] Birdie Sons, MD   1 year ago Acute recurrent pansinusitis   [facility] [place], Dionne Bucy, MD               Signed Prescriptions Disp Refills   ibuprofen (ADVIL) 800 MG tablet 30 tablet 0    Sig: ONE TABLET DAILY AS NEEDED. TRY TO MINIMIZE USE TO SEVERE PAIN.      Analgesics:  NSAIDS Passed - 08/21/2019  6:00 PM      Passed - Cr in normal range and within 360 days    Creatinine  Date Value Ref Range Status  06/05/2014 0.83 mg/dL Final    Comment:    0.44-1.00 NOTE: New Reference Range  04/26/14    Creatinine, Ser  Date Value Ref Range Status  12/22/2018 0.83 0.44 - 1.00 mg/dL Final          Passed - HGB in normal range and within 360 days    Hemoglobin  Date Value Ref Range Status  12/22/2018 14.9 12.0 - 15.0 g/dL Final   08/11/2018 15.3 11.1 - 15.9 g/dL Final          Passed - Patient is not pregnant      Passed - Valid encounter within last 12 months    Recent Outpatient Visits           7 months ago Elevated blood pressure reading   [facility] Birdie Sons, MD   10 months ago Major depressive disorder with single episode, remission status unspecified   [facility] Birdie Sons, MD   11 months ago [place], Donald E, MD   1 year ago Vitamin D deficiency   [facility] Birdie Sons, MD   1 year ago Acute recurrent pansinusitis   [facility] [place], Dionne Bucy, MD                fluticasone (FLONASE) 50 MCG/ACT nasal spray 48 mL 1    Sig: PLACE 1-2 SPRAYS INTO BOTH NOSTRILS DAILY AS NEEDED FOR ALLERGIES OR RHINITIS.      Ear, Nose, and Throat: Nasal Preparations -  Corticosteroids Passed - 08/21/2019  6:00 PM      Passed - Valid encounter within last 12 months    Recent Outpatient Visits           7 months ago Elevated blood pressure reading   [facility] Birdie Sons, MD   10 months ago Major depressive disorder with single episode, remission status unspecified   [facility] Birdie Sons, MD   11 months ago Anxiety   [facility] Birdie Sons, MD   1 year ago Vitamin D deficiency   [facility] Birdie Sons, MD   1 year ago Acute recurrent pansinusitis   [facility] [place], Dionne Bucy, MD

## 2019-08-21 NOTE — Telephone Encounter (Signed)
Requested Prescriptions  Pending Prescriptions Disp Refills  . ibuprofen (ADVIL) 800 MG tablet [Pharmacy Med Name: IBUPROFEN 800 MG TABLET] 30 tablet 0    Sig: ONE TABLET DAILY AS NEEDED. TRY TO MINIMIZE USE TO SEVERE PAIN.     Analgesics:  NSAIDS Passed - 08/21/2019  6:00 PM      Passed - Cr in normal range and within 360 days    Creatinine  Date Value Ref Range Status  06/05/2014 0.83 mg/dL Final    Comment:    0.44-1.00 NOTE: New Reference Range  04/26/14    Creatinine, Ser  Date Value Ref Range Status  12/22/2018 0.83 0.44 - 1.00 mg/dL Final         Passed - HGB in normal range and within 360 days    Hemoglobin  Date Value Ref Range Status  12/22/2018 14.9 12.0 - 15.0 g/dL Final  08/11/2018 15.3 11.1 - 15.9 g/dL Final         Passed - Patient is not pregnant      Passed - Valid encounter within last 12 months    Recent Outpatient Visits          7 months ago Elevated blood pressure reading   [facility] Birdie Sons, MD   10 months ago Major depressive disorder with single episode, remission status unspecified   [facility] Birdie Sons, MD   11 months ago [place], Donald E, MD   1 year ago Vitamin D deficiency   [place], [name], MD   1 year ago Acute recurrent pansinusitis   [facility] [place], Dionne Bucy, MD             . ALPRAZolam Duanne Moron) 0.5 MG tablet [Pharmacy Med Name: ALPRAZOLAM 0.5 MG TABLET] 90 tablet     Sig: TAKE 1 TABLET (0.5 MG TOTAL) BY MOUTH 3 (THREE) TIMES DAILY AS NEEDED FOR ANXIETY OR SLEEP.     Not Delegated - Psychiatry:  Anxiolytics/Hypnotics Failed - 08/21/2019  6:00 PM      Failed - This refill cannot be delegated      Failed - Urine Drug Screen completed in last 360 days.      Failed - Valid encounter within last 6 months    Recent Outpatient Visits          7 months ago Elevated blood pressure reading    [facility] Birdie Sons, MD   10 months ago Major depressive disorder with single episode, remission status unspecified   [facility] Birdie Sons, MD   11 months ago [place], Donald E, MD   1 year ago Vitamin D deficiency   [facility] Birdie Sons, MD   1 year ago Acute recurrent pansinusitis   [facility] [place], Dionne Bucy, MD             . fluticasone [facility]) 50 MCG/ACT nasal spray [Pharmacy Med Name: FLUTICASONE PROP 50 MCG SPRAY] 48 mL 1    Sig: PLACE 1-2 SPRAYS INTO BOTH NOSTRILS DAILY AS NEEDED FOR ALLERGIES OR RHINITIS.     Ear, Nose, and Throat: Nasal Preparations - Corticosteroids Passed - 08/21/2019  6:00 PM      Passed - Valid encounter within last 12 months    Recent Outpatient Visits          7 months ago Elevated blood pressure reading  [facility] [name], [name], MD   10 months ago Major depressive disorder with single episode, remission status unspecified   [facility] Birdie Sons, MD   11 months ago Anxiety   [facility] Birdie Sons, MD   1 year ago Vitamin D deficiency   [facility] [name], [name], MD   1 year ago Acute recurrent pansinusitis   [facility], Dionne Bucy, MD

## 2019-09-01 ENCOUNTER — Ambulatory Visit (INDEPENDENT_AMBULATORY_CARE_PROVIDER_SITE_OTHER): Payer: Medicare Other | Admitting: *Deleted

## 2019-09-01 DIAGNOSIS — F419 Anxiety disorder, unspecified: Secondary | ICD-10-CM

## 2019-09-01 DIAGNOSIS — F329 Major depressive disorder, single episode, unspecified: Secondary | ICD-10-CM

## 2019-09-01 NOTE — Patient Instructions (Signed)
Thank you allowing the Chronic Care Management Team to be a part of your care! It was a pleasure speaking with you today!  1. Please call this social worker with any questions or concerns regarding your mental health needs.  CCM (Chronic Care Management) Team   Neldon Labella RN, BSN Nurse Care Coordinator  [phone]  [place], LCSW Clinical Social Worker [phone]  Goals Addressed              This Visit's Progress   .  "I would like to manage my depression better" (pt-stated)        Current Barriers:  [name] Mental Health Concerns   Clinical Social Work Clinical Goal(s):  [name] Over the next 90 days, client will work with SW to address concerns related to depression  Interventions: . Followed up with patient in regards to her management of stress and depression . Patient continues to process her brother's death- stating that she has been sleeping more lately . Reviewed with patient the stages of grief and processed stage that patient may be in . Encouraged patient to follow up with her medical provider to rule out any medical causes to her fatigue . Provided patient with positive reinforcement and emotional support related to the sudden death of her brother and use of positive coping including letting negative relationships go that were not healthy for her . Discussed plans with patient for ongoing care management follow up and provided patient with direct contact information for care management team  Patient Self Care Activities:  . Performs ADL's independently . Performs IADL's independently . Calls provider office for new concerns or questions  Please see past updates related to this goal by clicking on the "Past Updates" button in the selected goal          The patient verbalized understanding of instructions provided today and declined a print copy of patient instruction materials.   Telephone follow up appointment with care management team member  scheduled for: 09/15/19

## 2019-09-01 NOTE — Chronic Care Management (AMB) (Signed)
  Chronic Care Management    Clinical Social Work Follow Up Note  09/01/2019 Name: [name] MRN: [id number] DOB: 28-Sep-1945  [name] is a 74 y.o. year old female who is a primary care patient of [name], [name], MD. The CCM team was consulted for assistance with Mental Health Counseling and Resources.   Review of patient status, including review of consultants reports, other relevant assessments, and collaboration with appropriate care team members and the patient's provider was performed as part of comprehensive patient evaluation and provision of chronic care management services.    SDOH (Social Determinants of Health) assessments performed: No    Outpatient Encounter Medications as of 09/01/2019  Medication Sig  . albuterol (VENTOLIN HFA) 108 (90 Base) MCG/ACT inhaler TAKE 2 PUFFS BY MOUTH EVERY 6 HOURS AS NEEDED FOR WHEEZE OR SHORTNESS OF BREATH  . ALPRAZolam (XANAX) 0.5 MG tablet TAKE 1 TABLET (0.5 MG TOTAL) BY MOUTH 3 (THREE) TIMES DAILY AS NEEDED FOR ANXIETY OR SLEEP.  [name] aspirin EC 81 MG tablet Take 81 mg by mouth daily.  . citalopram (CELEXA) 10 MG tablet TAKE 1 TABLET BY MOUTH EVERY DAY  . fluticasone (FLONASE) 50 MCG/ACT nasal spray PLACE 1-2 SPRAYS INTO BOTH NOSTRILS DAILY AS NEEDED FOR ALLERGIES OR RHINITIS.  . HYDROcodone-acetaminophen (NORCO/VICODIN) 5-325 MG tablet Take 1 tablet by mouth every 8 (eight) hours as needed for moderate pain.  [name] ibuprofen (ADVIL) 800 MG tablet ONE TABLET DAILY AS NEEDED. TRY TO MINIMIZE USE TO SEVERE PAIN.  [name] omeprazole (PRILOSEC) 20 MG capsule Take 20 mg by mouth daily as needed (acid reflux).    No facility-administered encounter medications on file as of 09/01/2019.     Goals Addressed              This Visit's Progress   .  "I would like to manage my depression better" (pt-stated)        Current Barriers:  [name] Mental Health Concerns   Clinical Social Work Clinical Goal(s):  [name] Over the next 90 days, client will work with SW  to address concerns related to depression  Interventions: . Followed up with patient in regards to her management of stress and depression . Patient continues to process her brother's death- stating that she has been sleeping more lately . Reviewed with patient the stages of grief and processed stage that patient may be in . Encouraged patient to follow up with her medical provider to rule out any medical causes to her fatigue . Provided patient with positive reinforcement and emotional support related to the sudden death of her brother and use of positive coping including letting negative relationships go that were not healthy for her . Discussed plans with patient for ongoing care management follow up and provided patient with direct contact information for care management team  Patient Self Care Activities:  . Performs ADL's independently . Performs IADL's independently . Calls provider office for new concerns or questions  Please see past updates related to this goal by clicking on the "Past Updates" button in the selected goal          Follow Up Plan: SW will follow up with patient by phone over the next 14 business days    [place], [place] Worker  [facility] Care Management [phone]

## 2019-09-11 ENCOUNTER — Other Ambulatory Visit: Payer: Self-pay | Admitting: Family Medicine

## 2019-09-11 DIAGNOSIS — J40 Bronchitis, not specified as acute or chronic: Secondary | ICD-10-CM

## 2019-09-11 NOTE — Telephone Encounter (Signed)
Requested Prescriptions  Pending Prescriptions Disp Refills  . albuterol (VENTOLIN HFA) 108 (90 Base) MCG/ACT inhaler [Pharmacy Med Name: ALBUTEROL HFA (PROAIR) INHALER] 8.5 g 2    Sig: TAKE 2 PUFFS BY MOUTH EVERY 6 HOURS AS NEEDED FOR WHEEZE OR SHORTNESS OF BREATH     Pulmonology:  Beta Agonists Failed - 09/11/2019 10:23 AM      Failed - One inhaler should last at least one month. If the patient is requesting refills earlier, contact the patient to check for uncontrolled symptoms.      Passed - Valid encounter within last 12 months    Recent Outpatient Visits          8 months ago Elevated blood pressure reading   [facility] Birdie Sons, MD   11 months ago Major depressive disorder with single episode, remission status unspecified   [facility] Birdie Sons, MD   1 year ago [place], Donald E, MD   1 year ago Vitamin D deficiency   [facility] Birdie Sons, MD   1 year ago Acute recurrent pansinusitis   [facility], Dionne Bucy, MD

## 2019-09-15 ENCOUNTER — Ambulatory Visit: Payer: Self-pay | Admitting: *Deleted

## 2019-09-15 DIAGNOSIS — F419 Anxiety disorder, unspecified: Secondary | ICD-10-CM

## 2019-09-15 DIAGNOSIS — F329 Major depressive disorder, single episode, unspecified: Secondary | ICD-10-CM | POA: Diagnosis not present

## 2019-09-15 NOTE — Patient Instructions (Signed)
Thank you allowing the Chronic Care Management Team to be a part of your care! It was a pleasure speaking with you today!  1. Please continue to practice positive coping strategies discussed today  CCM (Chronic Care Management) Team   Neldon Labella RN, BSN Nurse Care Coordinator  [phone]  [place], [place] Social Worker [phone]  Goals Addressed              This Visit's Progress   .  "I would like to manage my depression better" (pt-stated)        Current Barriers:  [name] Mental Health Concerns   Clinical Social Work Clinical Goal(s):  [name] Over the next 90 days, client will work with SW to address concerns related to depression  Interventions: . Followed up with patient in regards to her management of stress and depression . Patient continues to process her brother's death-now the death of another family member . Discussed positive coping strategies-including separating her self from situations that may cause increased stress . Patient discussed that her sleep has improved since last visit . Continued to provided patient with positive reinforcement and emotional support related to the death of her brother and recent loss of additional family members  emphasizing use positive coping including letting negative relationships go that were not healthy for her . Discussed plans with patient for ongoing care management follow up and provided patient with direct contact information for care management team  Patient Self Care Activities:  . Performs ADL's independently . Performs IADL's independently . Calls provider office for new concerns or questions  Please see past updates related to this goal by clicking on the "Past Updates" button in the selected goal          The patient verbalized understanding of instructions provided today and declined a print copy of patient instruction materials.   Telephone follow up appointment with care management team  member scheduled for: 09/29/19

## 2019-09-15 NOTE — Chronic Care Management (AMB) (Signed)
  Chronic Care Management    Clinical Social Work Follow Up Note  09/15/2019 Name: [name] MRN: [id number] DOB: 1945-09-03  [name] is a 74 y.o. year old female who is a primary care patient of [name], [name], MD. The CCM team was consulted for assistance with Mental Health Counseling and Resources.   Review of patient status, including review of consultants reports, other relevant assessments, and collaboration with appropriate care team members and the patient's provider was performed as part of comprehensive patient evaluation and provision of chronic care management services.    SDOH (Social Determinants of Health) assessments performed: No    Outpatient Encounter Medications as of 09/15/2019  Medication Sig  . albuterol (VENTOLIN HFA) 108 (90 Base) MCG/ACT inhaler TAKE 2 PUFFS BY MOUTH EVERY 6 HOURS AS NEEDED FOR WHEEZE OR SHORTNESS OF BREATH  . ALPRAZolam (XANAX) 0.5 MG tablet TAKE 1 TABLET (0.5 MG TOTAL) BY MOUTH 3 (THREE) TIMES DAILY AS NEEDED FOR ANXIETY OR SLEEP.  [name] aspirin EC 81 MG tablet Take 81 mg by mouth daily.  . citalopram (CELEXA) 10 MG tablet TAKE 1 TABLET BY MOUTH EVERY DAY  . fluticasone (FLONASE) 50 MCG/ACT nasal spray PLACE 1-2 SPRAYS INTO BOTH NOSTRILS DAILY AS NEEDED FOR ALLERGIES OR RHINITIS.  . HYDROcodone-acetaminophen (NORCO/VICODIN) 5-325 MG tablet Take 1 tablet by mouth every 8 (eight) hours as needed for moderate pain.  [name] ibuprofen (ADVIL) 800 MG tablet ONE TABLET DAILY AS NEEDED. TRY TO MINIMIZE USE TO SEVERE PAIN.  [name] omeprazole (PRILOSEC) 20 MG capsule Take 20 mg by mouth daily as needed (acid reflux).    No facility-administered encounter medications on file as of 09/15/2019.     Goals Addressed              This Visit's Progress   .  "I would like to manage my depression better" (pt-stated)        Current Barriers:  [name] Mental Health Concerns   Clinical Social Work Clinical Goal(s):  [name] Over the next 90 days, client will work with SW  to address concerns related to depression  Interventions: . Followed up with patient in regards to her management of stress and depression . Patient continues to process her brother's death-now the death of another family member . Discussed positive coping strategies-including separating her self from situations that may cause increased stress . Patient discussed that her sleep has improved since last visit . Continued to provided patient with positive reinforcement and emotional support related to the death of her brother and recent loss of additional family members  emphasizing use positive coping including letting negative relationships go that were not healthy for her . Discussed plans with patient for ongoing care management follow up and provided patient with direct contact information for care management team  Patient Self Care Activities:  . Performs ADL's independently . Performs IADL's independently . Calls provider office for new concerns or questions  Please see past updates related to this goal by clicking on the "Past Updates" button in the selected goal          Follow Up Plan: SW will follow up with patient by phone over the next 14 business days    [place], [place] Worker  [facility] Care Management [phone]

## 2019-09-19 ENCOUNTER — Other Ambulatory Visit: Payer: Self-pay | Admitting: Family Medicine

## 2019-09-19 DIAGNOSIS — G8929 Other chronic pain: Secondary | ICD-10-CM

## 2019-09-19 NOTE — Telephone Encounter (Signed)
Requested Prescriptions  Pending Prescriptions Disp Refills  . ibuprofen (ADVIL) 800 MG tablet [Pharmacy Med Name: IBUPROFEN 800 MG TABLET] 30 tablet 0    Sig: ONE TABLET DAILY AS NEEDED. TRY TO MINIMIZE USE TO SEVERE PAIN.     Analgesics:  NSAIDS Passed - 09/19/2019  9:09 AM      Passed - Cr in normal range and within 360 days    Creatinine  Date Value Ref Range Status  06/05/2014 0.83 mg/dL Final    Comment:    0.44-1.00 NOTE: New Reference Range  04/26/14    Creatinine, Ser  Date Value Ref Range Status  12/22/2018 0.83 0.44 - 1.00 mg/dL Final         Passed - HGB in normal range and within 360 days    Hemoglobin  Date Value Ref Range Status  12/22/2018 14.9 12.0 - 15.0 g/dL Final  08/11/2018 15.3 11.1 - 15.9 g/dL Final         Passed - Patient is not pregnant      Passed - Valid encounter within last 12 months    Recent Outpatient Visits          8 months ago Elevated blood pressure reading   [facility] Birdie Sons, MD   11 months ago Major depressive disorder with single episode, remission status unspecified   [facility] Birdie Sons, MD   1 year ago [place], Donald E, MD   1 year ago Vitamin D deficiency   [facility] Birdie Sons, MD   1 year ago Acute recurrent pansinusitis   [facility] [place], Dionne Bucy, MD

## 2019-09-21 ENCOUNTER — Other Ambulatory Visit: Payer: Self-pay

## 2019-09-21 ENCOUNTER — Encounter: Payer: Self-pay | Admitting: *Deleted

## 2019-09-21 ENCOUNTER — Inpatient Hospital Stay
Admission: EM | Admit: 2019-09-21 | Discharge: 2019-09-25 | DRG: 390 | Disposition: A | Discharge status: Home / Self Care | Payer: Medicare Other | Attending: Surgery | Admitting: Surgery

## 2019-09-21 ENCOUNTER — Emergency Department: Payer: Medicare Other

## 2019-09-21 DIAGNOSIS — K56609 Unspecified intestinal obstruction, unspecified as to partial versus complete obstruction: Secondary | ICD-10-CM | POA: Diagnosis not present

## 2019-09-21 DIAGNOSIS — Z9049 Acquired absence of other specified parts of digestive tract: Secondary | ICD-10-CM

## 2019-09-21 DIAGNOSIS — Z7982 Long term (current) use of aspirin: Secondary | ICD-10-CM | POA: Diagnosis not present

## 2019-09-21 DIAGNOSIS — K219 Gastro-esophageal reflux disease without esophagitis: Secondary | ICD-10-CM | POA: Diagnosis present

## 2019-09-21 DIAGNOSIS — R531 Weakness: Secondary | ICD-10-CM | POA: Diagnosis not present

## 2019-09-21 DIAGNOSIS — R404 Transient alteration of awareness: Secondary | ICD-10-CM | POA: Diagnosis not present

## 2019-09-21 DIAGNOSIS — K56600 Partial intestinal obstruction, unspecified as to cause: Principal | ICD-10-CM | POA: Diagnosis present

## 2019-09-21 DIAGNOSIS — K6389 Other specified diseases of intestine: Secondary | ICD-10-CM | POA: Diagnosis not present

## 2019-09-21 DIAGNOSIS — Z743 Need for continuous supervision: Secondary | ICD-10-CM | POA: Diagnosis not present

## 2019-09-21 DIAGNOSIS — E785 Hyperlipidemia, unspecified: Secondary | ICD-10-CM | POA: Diagnosis not present

## 2019-09-21 DIAGNOSIS — Z8673 Personal history of transient ischemic attack (TIA), and cerebral infarction without residual deficits: Secondary | ICD-10-CM | POA: Diagnosis not present

## 2019-09-21 DIAGNOSIS — I10 Essential (primary) hypertension: Secondary | ICD-10-CM | POA: Diagnosis present

## 2019-09-21 DIAGNOSIS — Z20822 Contact with and (suspected) exposure to covid-19: Secondary | ICD-10-CM | POA: Diagnosis present

## 2019-09-21 DIAGNOSIS — Z79899 Other long term (current) drug therapy: Secondary | ICD-10-CM

## 2019-09-21 DIAGNOSIS — R1084 Generalized abdominal pain: Secondary | ICD-10-CM | POA: Diagnosis not present

## 2019-09-21 DIAGNOSIS — K5730 Diverticulosis of large intestine without perforation or abscess without bleeding: Secondary | ICD-10-CM | POA: Diagnosis not present

## 2019-09-21 DIAGNOSIS — Z90710 Acquired absence of both cervix and uterus: Secondary | ICD-10-CM

## 2019-09-21 LAB — BASIC METABOLIC PANEL
Anion gap: 11 (ref 5–15)
BUN: 16 mg/dL (ref 8–23)
CO2: 23 mmol/L (ref 22–32)
Calcium: 9.6 mg/dL (ref 8.9–10.3)
Chloride: 103 mmol/L (ref 98–111)
Creatinine, Ser: 0.88 mg/dL (ref 0.44–1.00)
GFR calc Af Amer: >60 mL/min (ref >60)
GFR calc non Af Amer: >60 mL/min (ref >60)
Glucose, Bld: 165 mg/dL — ABNORMAL HIGH (ref 70–99)
Potassium: 3.7 mmol/L (ref 3.5–5.1)
Sodium: 137 mmol/L (ref 135–145)

## 2019-09-21 LAB — CBC
HCT: 47.4 % — ABNORMAL HIGH (ref 36.0–46.0)
Hemoglobin: 17 g/dL — ABNORMAL HIGH (ref 12.0–15.0)
MCH: 31.1 pg (ref 26.0–34.0)
MCHC: 35.9 g/dL (ref 30.0–36.0)
MCV: 86.7 fL (ref 80.0–100.0)
Platelets: 284 10*3/uL (ref 150–400)
RBC: 5.47 MIL/uL — ABNORMAL HIGH (ref 3.87–5.11)
RDW: 13.5 % (ref 11.5–15.5)
WBC: 9.9 10*3/uL (ref 4.0–10.5)
nRBC: 0 % (ref 0.0–0.2)

## 2019-09-21 LAB — GLUCOSE, CAPILLARY: Glucose-Capillary: 159 mg/dL — ABNORMAL HIGH (ref 70–99)

## 2019-09-21 MED ORDER — SODIUM CHLORIDE 0.9 % IV BOLUS
500.0000 mL | Freq: Once | INTRAVENOUS | Status: AC
  Administered 2019-09-21: 500 mL via INTRAVENOUS

## 2019-09-21 MED ORDER — IOHEXOL 300 MG/ML  SOLN
100.0000 mL | Freq: Once | INTRAMUSCULAR | Status: AC | PRN
  Administered 2019-09-21: 100 mL via INTRAVENOUS

## 2019-09-21 MED ORDER — MORPHINE SULFATE (PF) 4 MG/ML IV SOLN
4.0000 mg | Freq: Once | INTRAVENOUS | Status: AC
  Administered 2019-09-21: 4 mg via INTRAVENOUS
  Filled 2019-09-21: qty 1

## 2019-09-21 MED ORDER — SODIUM CHLORIDE 0.9% FLUSH: 3.0000 mL | Freq: Once | INTRAVENOUS | Status: DC

## 2019-09-21 MED ORDER — ONDANSETRON HCL 4 MG/2ML IJ SOLN
4.0000 mg | Freq: Once | INTRAMUSCULAR | Status: AC
  Administered 2019-09-21: 4 mg via INTRAVENOUS
  Filled 2019-09-21: qty 2

## 2019-09-21 NOTE — ED Triage Notes (Signed)
Pt to ED reporting weakness and feeling "sick" for the past 4 hours. Pt brought by EMS after a syncopal episode. PT slow to answer questions in triage and when asked about symptoms pt states, "im sick."

## 2019-09-21 NOTE — ED Provider Notes (Signed)
[facility] Emergency Department Provider Note   ____________________________________________    I have reviewed the triage vital signs and the nursing notes.   HISTORY  Chief Complaint Abdominal pain, nausea, weakness    HPI [name] is a 74 y.o. female who presents with complaints of abdominal pain, nausea.  She reports she feels bloated and has moderate to severe abdominal pain.  She reports a surgical history of partial colectomy with Dr. Tollie Pizza in the past.  She notes that she has had small bowel obstructions almost yearly and she thinks that is what is happening today.  She denies fevers or chills.  She has felt lightheaded and ill.  Has not take anything for this.  Past Medical History:  Diagnosis Date  . Anxiety   . Arthritis    lower spine  . Back pain    lower back - S/P lifting injury  . Complication of anesthesia    makes hair brittle  . GERD (gastroesophageal reflux disease)   . Hyperlipidemia   . Hypertension   . Neuromuscular disorder (HCC)    numbness legs and feet s/p lower back injury  . Partial bowel obstruction ([place]) 02/04/2018  . Stroke [facility])    "mini - strokes" 2009 - no deficit  . Vertigo    none recently  . Vitamin D deficiency   . Wears contact lenses     Patient Active Problem List   Diagnosis Date Noted  . Partial small bowel obstruction ([place]) 02/04/2018  . SBO (small bowel obstruction) ([place]) 03/13/2017  . Panic disorder 02/21/2016  . Weakness of both lower extremities 02/21/2016  . Depression 02/21/2016  . Problems with swallowing and mastication   . Stricture and stenosis of esophagus   . 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 Acid reflux 10/20/2014  . Hx of colonic polyps   . Benign neoplasm of transverse colon   . Diverticulosis of large intestine without diverticulitis   . Allergic rhinitis 08/01/2014  . Anxiety 08/01/2014  . HLD (hyperlipidemia) 08/01/2014  . BP (high blood pressure) 08/01/2014   . Vitamin D deficiency 08/01/2014  . H/O adenomatous polyp of colon 01/22/2012    Past Surgical History:  Procedure Laterality Date  . ABDOMINAL HYSTERECTOMY  1987  . CARDIAC CATHETERIZATION  02/2007  . COLON SURGERY  03/12/2012   Dr Phylis Bougie  . COLONOSCOPY WITH PROPOFOL N/A 09/26/2014   Procedure: COLONOSCOPY WITH PROPOFOL;  Surgeon: Lucilla Lame, MD;  Location: [place];  Service: Endoscopy;  Laterality: N/A;  marker (tattoo) used in colon  . ESOPHAGEAL DILATION N/A 02/02/2016   Procedure: ESOPHAGEAL DILATION;  Surgeon: Lucilla Lame, MD;  Location: [place];  Service: Endoscopy;  Laterality: N/A;  . ESOPHAGOGASTRODUODENOSCOPY (EGD) WITH PROPOFOL N/A 02/02/2016   Procedure: ESOPHAGOGASTRODUODENOSCOPY (EGD) WITH PROPOFOL;  Surgeon: Lucilla Lame, MD;  Location: [place];  Service: Endoscopy;  Laterality: N/A;  . POLYPECTOMY  09/26/2014   Procedure: POLYPECTOMY;  Surgeon: Lucilla Lame, MD;  Location: [place];  Service: Endoscopy;;  . [place]    Prior to Admission medications   Medication Sig Start Date End Date Taking? Authorizing Provider  albuterol (VENTOLIN HFA) 108 (90 Base) MCG/ACT inhaler TAKE 2 PUFFS BY MOUTH EVERY 6 HOURS AS NEEDED FOR WHEEZE OR SHORTNESS OF BREATH 09/11/19  Yes Birdie Sons, MD  ALPRAZolam (XANAX) 0.5 MG tablet TAKE 1 TABLET (0.5 MG TOTAL) BY MOUTH 3 (THREE) TIMES DAILY AS NEEDED FOR ANXIETY OR SLEEP. 08/22/19  Yes Birdie Sons, MD  aspirin EC 81 MG tablet Take 81 mg by mouth daily.   Yes [provider]  citalopram (CELEXA) 10 MG tablet TAKE 1 TABLET BY MOUTH EVERY DAY 12/30/18  Yes Birdie Sons, MD  fluticasone (FLONASE) 50 MCG/ACT nasal spray PLACE 1-2 SPRAYS INTO BOTH NOSTRILS DAILY AS NEEDED FOR ALLERGIES OR RHINITIS. 08/21/19  Yes Birdie Sons, MD  ibuprofen (ADVIL) 800 MG tablet ONE TABLET DAILY AS NEEDED. TRY TO MINIMIZE USE TO SEVERE PAIN. 09/19/19  Yes Birdie Sons, MD  omeprazole  (PRILOSEC) 20 MG capsule Take 20 mg by mouth daily as needed (acid reflux).    Yes [provider]  venlafaxine XR (EFFEXOR-XR) 37.5 MG 24 hr capsule Take 37.5 mg by mouth daily. Patient not taking: Reported on 09/21/2019 05/13/19   [provider]     Allergies Levofloxacin, Calcium channel blockers, Amoxicillin, Nickel, and Penicillins  Family History  Problem Relation Age of Onset  . Diabetes Mother   . Heart disease Mother   . Hypertension Mother   . Mental illness Mother   . Cancer Father        lung cancer  . Drug abuse Brother   . Multiple sclerosis Brother     Social History Social History   Tobacco Use  . Smoking status: Never Smoker  . Smokeless tobacco: Never Used  Vaping Use  . Vaping Use: Never used  Substance Use Topics  . Alcohol use: No    Alcohol/week: 0.0 standard drinks  . Drug use: No    Review of Systems  Constitutional: No fever/chills Eyes: No visual changes.  ENT: No sore throat. Cardiovascular: Denies chest pain. Respiratory: Denies shortness of breath. Gastrointestinal: As above Genitourinary: Negative for dysuria. Musculoskeletal: Negative for back pain. Skin: Negative for rash. Neurological: Negative for headaches   ____________________________________________   PHYSICAL EXAM:  VITAL SIGNS: ED Triage Vitals  Enc Vitals Group     BP 09/21/19 1946 130/84     Pulse Rate 09/21/19 1946 67     Resp 09/21/19 1946 16     Temp 09/21/19 2000 (!) 94 F (34.4 C)     Temp Source 09/21/19 2000 Oral     SpO2 09/21/19 1946 96 %     Weight 09/21/19 1942 77.1 kg (170 lb)     Height 09/21/19 1942 1.6 m (5\' 3" )     Head Circumference --      Peak Flow --      Pain Score 09/21/19 1942 9     Pain Loc --      Pain Edu? --      Excl. in [place]? --     Constitutional: Alert and oriented.  Nose: No congestion/rhinnorhea. Mouth/Throat: Mucous membranes are moist.   Neck:  Painless ROM Cardiovascular: Normal rate, regular  rhythm. Grossly normal heart sounds.  Good peripheral circulation. Respiratory: Normal respiratory effort.  No retractions. Lungs CTAB. Gastrointestinal: Tenderness palpation periumbilically, positive distention, no CVA tenderness Musculoskeletal: Warm and well perfused Neurologic:  Normal speech and language. No gross focal neurologic deficits are appreciated.  Skin:  Skin is warm, dry and intact. No rash noted. Psychiatric: Mood and affect are normal. Speech and behavior are normal.  ____________________________________________   LABS (all labs ordered are listed, but only abnormal results are displayed)  Labs Reviewed  BASIC METABOLIC PANEL - Abnormal; Notable for the following components:      Result Value   Glucose, Bld 165 (*)  All other components within normal limits  CBC - Abnormal; Notable for the following components:   RBC 5.47 (*)    Hemoglobin 17.0 (*)    HCT 47.4 (*)    All other components within normal limits  GLUCOSE, CAPILLARY - Abnormal; Notable for the following components:   Glucose-Capillary 159 (*)    All other components within normal limits  SARS CORONAVIRUS 2 BY RT PCR (HOSPITAL ORDER, [place] LAB)  URINALYSIS, COMPLETE (UACMP) WITH MICROSCOPIC  CBG MONITORING, ED   ____________________________________________  EKG  ED ECG REPORT I, Lavonia Drafts, the attending physician, personally viewed and interpreted this ECG.  Date: 09/21/2019  Rhythm: normal sinus rhythm QRS Axis: normal Intervals: normal ST/T Wave abnormalities: normal Narrative Interpretation: no evidence of acute ischemia  ____________________________________________  RADIOLOGY  CT abdomen pelvis demonstrates small bowel obstruction with transition point ____________________________________________   PROCEDURES  Procedure(s) performed: No  Procedures   Critical Care performed: No ____________________________________________   INITIAL  IMPRESSION / ASSESSMENT AND PLAN / ED COURSE  Pertinent labs & imaging results that were available during my care of the patient were reviewed by me and considered in my medical decision making (see chart for details).  Patient presents with abdominal pain, distention, nausea.  She reports this feels similar to prior episodes of bowel obstructions.  Pending CT abdomen pelvis.  Treated with IV morphine, IV Zofran.  Have reviewed prior records, history of SBO.  Lab work is overall reassuring, white blood cell count is normal.  CT scan demonstrates small bowel obstruction, discussed with Dr. Lysle Pearl of surgery, he has seen the patient in the emergency department and will admit to his service.    ____________________________________________   FINAL CLINICAL IMPRESSION(S) / ED DIAGNOSES  Final diagnoses:  Small bowel obstruction ([place])        Note:  This document was prepared using Dragon voice recognition software and may include unintentional dictation errors.   Lavonia Drafts, MD 09/21/19 2300

## 2019-09-21 NOTE — H&P (Signed)
Subjective:   CC: SBO  HPI:  [name] is a 74 y.o. female who was consulted by Corky Downs for issue above.  Symptoms were first noted 1 day ago. Pain is sharp lower abdomen.  Associated with nausea and dry heaves, exacerbated by nothing specific.  Alleviated by nothing specific.  Last bowel movement was this morning.  Patient is a recurrent episodes of this, last seen October 2020.  She has had follow-up with Dr. Tollie Pizza and there was discussions about possible lysis of adhesions but that was postponed due to rising Covid cases.   Past Medical History:  has a past medical history of Anxiety, Arthritis, Back pain, Complication of anesthesia, GERD (gastroesophageal reflux disease), Hyperlipidemia, Hypertension, Neuromuscular disorder ([place]), Partial bowel obstruction ([place]) (02/04/2018), Stroke ([place]), Vertigo, Vitamin D deficiency, and Wears contact lenses.  Past Surgical History:  Past Surgical History:  Procedure Laterality Date  . ABDOMINAL HYSTERECTOMY  1987  . CARDIAC CATHETERIZATION  02/2007  . COLON SURGERY  03/12/2012   Dr Phylis Bougie  . COLONOSCOPY WITH PROPOFOL N/A 09/26/2014   Procedure: COLONOSCOPY WITH PROPOFOL;  Surgeon: Lucilla Lame, MD;  Location: [place];  Service: Endoscopy;  Laterality: N/A;  marker (tattoo) used in colon  . ESOPHAGEAL DILATION N/A 02/02/2016   Procedure: ESOPHAGEAL DILATION;  Surgeon: Lucilla Lame, MD;  Location: [place];  Service: Endoscopy;  Laterality: N/A;  . ESOPHAGOGASTRODUODENOSCOPY (EGD) WITH PROPOFOL N/A 02/02/2016   Procedure: ESOPHAGOGASTRODUODENOSCOPY (EGD) WITH PROPOFOL;  Surgeon: Lucilla Lame, MD;  Location: [place];  Service: Endoscopy;  Laterality: N/A;  . POLYPECTOMY  09/26/2014   Procedure: POLYPECTOMY;  Surgeon: Lucilla Lame, MD;  Location: [place];  Service: Endoscopy;;  . TUBAL LIGATION  1972    Family History: family history includes Cancer in her father; Diabetes in her mother; Drug abuse in her  brother; Heart disease in her mother; Hypertension in her mother; Mental illness in her mother; Multiple sclerosis in her brother.  Social History:  reports that she has never smoked. She has never used smokeless tobacco. She reports that she does not drink alcohol and does not use drugs.  Current Medications:  Prior to Admission medications   Medication Sig Start Date End Date Taking? Authorizing Provider  albuterol (VENTOLIN HFA) 108 (90 Base) MCG/ACT inhaler TAKE 2 PUFFS BY MOUTH EVERY 6 HOURS AS NEEDED FOR WHEEZE OR SHORTNESS OF BREATH 09/11/19  Yes Birdie Sons, MD  ALPRAZolam (XANAX) 0.5 MG tablet TAKE 1 TABLET (0.5 MG TOTAL) BY MOUTH 3 (THREE) TIMES DAILY AS NEEDED FOR ANXIETY OR SLEEP. 08/22/19  Yes Birdie Sons, MD  aspirin EC 81 MG tablet Take 81 mg by mouth daily.   Yes [provider]  citalopram (CELEXA) 10 MG tablet TAKE 1 TABLET BY MOUTH EVERY DAY 12/30/18  Yes Birdie Sons, MD  fluticasone (FLONASE) 50 MCG/ACT nasal spray PLACE 1-2 SPRAYS INTO BOTH NOSTRILS DAILY AS NEEDED FOR ALLERGIES OR RHINITIS. 08/21/19  Yes Birdie Sons, MD  ibuprofen (ADVIL) 800 MG tablet ONE TABLET DAILY AS NEEDED. TRY TO MINIMIZE USE TO SEVERE PAIN. 09/19/19  Yes Birdie Sons, MD  omeprazole (PRILOSEC) 20 MG capsule Take 20 mg by mouth daily as needed (acid reflux).    Yes [provider]  venlafaxine XR (EFFEXOR-XR) 37.5 MG 24 hr capsule Take 37.5 mg by mouth daily. Patient not taking: Reported on 09/21/2019 05/13/19   [provider]    Allergies:  Allergies as of 09/21/2019 - Review Complete  09/21/2019  Allergen Reaction Noted  . Levofloxacin  12/19/2016  . Calcium channel blockers  08/01/2014  . Amoxicillin Other (See Comments) 08/01/2014  . Nickel Rash 09/16/2014  . Penicillins Other (See Comments) 08/01/2014    ROS:  General: Denies weight loss, weight gain, fatigue, fevers, chills, and night sweats. Eyes: Denies blurry vision, double vision, eye pain,  itchy eyes, and tearing. Ears: Denies hearing loss, earache, and ringing in ears. Nose: Denies sinus pain, congestion, infections, runny nose, and nosebleeds. Mouth/throat: Denies hoarseness, sore throat, bleeding gums, and difficulty swallowing. Heart: Denies chest pain, palpitations, racing heart, irregular heartbeat, leg pain or swelling, and decreased activity tolerance. Respiratory: Denies breathing difficulty, shortness of breath, wheezing, cough, and sputum. GI: Denies change in appetite, heartburn, vomiting, constipation, diarrhea, and blood in stool. GU: Denies difficulty urinating, pain with urinating, urgency, frequency, blood in urine. Musculoskeletal: Denies joint stiffness, pain, swelling, muscle weakness. Skin: Denies rash, itching, mass, tumors, sores, and boils Neurologic: Denies headache, fainting, dizziness, seizures, numbness, and tingling. Psychiatric: Denies depression, anxiety, difficulty sleeping, and memory loss. Endocrine: Denies heat or cold intolerance, and increased thirst or urination. Blood/lymph: Denies easy bruising, easy bruising, and swollen glands     Objective:     BP (!) 149/87   Pulse 63   Temp (!) 97 F (36.1 C) (Rectal)   Resp 16   Ht 5\' 3"  (1.6 m)   Wt 77.1 kg   SpO2 98%   BMI 30.11 kg/m   Constitutional :  alert, cooperative, appears stated age and no distress  Lymphatics/Throat:  no asymmetry, masses, or scars  Respiratory:  clear to auscultation bilaterally  Cardiovascular:  regular rate and rhythm  Gastrointestinal: Soft, no guarding, but increased tympany and distention noted.  Focal tenderness to palpation lower abdomen..   Musculoskeletal: Steady movement  Skin: Cool and moist   Psychiatric: Normal affect, non-agitated, not confused       LABS:  CMP Latest Ref Rng & Units 09/21/2019 12/22/2018 12/21/2018  Glucose 70 - 99 mg/dL 165(H) 93 121(H)  BUN 8 - 23 mg/dL 16 19 25(H)  Creatinine 0.44 - 1.00 mg/dL 0.88 0.83 0.83  Sodium  135 - 145 mmol/L 137 135 137  Potassium 3.5 - 5.1 mmol/L 3.7 3.2(L) 3.9  Chloride 98 - 111 mmol/L 103 101 104  CO2 22 - 32 mmol/L 23 26 25   Calcium 8.9 - 10.3 mg/dL 9.6 8.5(L) 9.2  Total Protein 6.5 - 8.1 g/dL - - -  Total Bilirubin 0.3 - 1.2 mg/dL - - -  Alkaline Phos 38 - 126 U/L - - -  AST 15 - 41 U/L - - -  ALT 0 - 44 U/L - - -   CBC Latest Ref Rng & Units 09/21/2019 12/22/2018 12/21/2018  WBC 4.0 - 10.5 K/uL 9.9 8.7 12.1(H)  Hemoglobin 12.0 - 15.0 g/dL 17.0(H) 14.9 15.8(H)  Hematocrit 36 - 46 % 47.4(H) 43.3 45.6  Platelets 150 - 400 K/uL 284 226 259    RADS: CLINICAL DATA:  Suspected bowel obstruction. Nausea, weakness, and sick feeling for 4 hours. Syncopal episode.  EXAM: CT ABDOMEN AND PELVIS WITH CONTRAST  TECHNIQUE: Multidetector CT imaging of the abdomen and pelvis was performed using the standard protocol following bolus administration of intravenous contrast.  CONTRAST:  190mL OMNIPAQUE IOHEXOL 300 MG/ML  SOLN  COMPARISON:  12/19/2018  FINDINGS: Lower chest: Mild dependent changes in the lung bases. Small amount of fluid in the esophagus, likely reflux.  Hepatobiliary: No focal liver abnormality is  seen. Status post cholecystectomy. No biliary dilatation.  Pancreas: Unremarkable. No pancreatic ductal dilatation or surrounding inflammatory changes.  Spleen: Several focal circumscribed low-attenuation lesions in the spleen, largest measuring about 2.1 cm diameter. No change since previous study. 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  Adrenals/Urinary Tract: Adrenal glands are unremarkable. Kidneys are normal, without renal calculi, focal lesion, or hydronephrosis. Bladder is unremarkable.  Stomach/Bowel: The stomach is moderately distended and filled with fluid and ingested material. No gastric wall thickening. Proximal small bowel are distended and fluid-filled with multiple air-fluid levels. Terminal ileum is decompressed with transition zone in the left mid  to lower abdomen. The colon is not abnormally distended. There appears to be an anastomosis in the mid transverse colon. Diverticulosis of the sigmoid colon.  Vascular/Lymphatic: Aortic atherosclerosis. No enlarged abdominal or pelvic lymph nodes.  Reproductive: Status post hysterectomy. No adnexal masses.  Other: No free air or free fluid in the abdomen. Abdominal wall musculature appears intact.  Musculoskeletal: No acute or significant osseous findings.  IMPRESSION: 1. Small bowel obstruction with transition zone in the left mid to lower abdomen. 2. Diverticulosis of the sigmoid colon without evidence of diverticulitis. 3. Several focal low-attenuation lesions in the spleen, largest measuring 2.1 cm diameter. 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 4. Small amount of fluid in the esophagus, likely reflux. 5. Aortic atherosclerosis.  Aortic Atherosclerosis (ICD10-I70.0).   Electronically Signed   By: Lucienne Capers M.D.   On: 09/21/2019 22:14  Assessment:   SBO  Plan:    Recommend NG tube decompression but patient refused.  Discussed risks of possible emesis and aspiration pneumonia that can lead to further decline in health.  Again verbalized understanding but still refused NG tube placement at this time.    She stated that she would like to avoid surgery if at all possible, to her concerns about the current delta variant surge..  I discussed with her that we will keep her overnight and monitor her, with IV fluid resuscitation.  Will discuss case with Dr. Bary Castilla in the morning.  No leukocytosis, clinical exam with no signs of acute peritonitis, and vitals are stable along with CT findings not concerning for any need for emergent surgical intervention overnight.

## 2019-09-21 NOTE — ED Notes (Signed)
Pt doesn't remember syncopal episode from this afternoon. Pt states up until eating ice cream she felt fine.

## 2019-09-21 NOTE — ED Notes (Signed)
Pt had rectal temp of 97.0, socks put on pt and warm blankets provided.

## 2019-09-21 NOTE — ED Notes (Signed)
Unable to obtain temp after multiple oral and axillary attempts

## 2019-09-21 NOTE — ED Notes (Signed)
Patient transported to CT 

## 2019-09-22 ENCOUNTER — Inpatient Hospital Stay: Payer: Medicare Other

## 2019-09-22 LAB — SARS CORONAVIRUS 2 BY RT PCR (HOSPITAL ORDER, PERFORMED IN ~~LOC~~ HOSPITAL LAB): SARS Coronavirus 2: NEGATIVE

## 2019-09-22 LAB — PHOSPHORUS: Phosphorus: 3.9 mg/dL (ref 2.5–4.6)

## 2019-09-22 LAB — MAGNESIUM: Magnesium: 2.5 mg/dL — ABNORMAL HIGH (ref 1.7–2.4)

## 2019-09-22 MED ORDER — HYDROCODONE-ACETAMINOPHEN 5-325 MG PO TABS
1.0000 | ORAL_TABLET | ORAL | Status: DC | PRN
  Administered 2019-09-23: 1 via ORAL
  Filled 2019-09-22: qty 2
  Filled 2019-09-22: qty 1

## 2019-09-22 MED ORDER — CITALOPRAM HYDROBROMIDE 10 MG PO TABS
10.0000 mg | ORAL_TABLET | Freq: Every day | ORAL | Status: DC
  Administered 2019-09-22 – 2019-09-24 (×3): 10 mg via ORAL
  Filled 2019-09-22 (×6): qty 1

## 2019-09-22 MED ORDER — ONDANSETRON HCL 4 MG/2ML IJ SOLN
4.0000 mg | Freq: Four times a day (QID) | INTRAMUSCULAR | Status: DC | PRN
  Administered 2019-09-22 – 2019-09-23 (×3): 4 mg via INTRAVENOUS
  Filled 2019-09-22 (×3): qty 2

## 2019-09-22 MED ORDER — DEXTROSE IN LACTATED RINGERS 5 % IV SOLN: INTRAVENOUS | Status: DC

## 2019-09-22 MED ORDER — ENOXAPARIN SODIUM 40 MG/0.4ML ~~LOC~~ SOLN
40.0000 mg | SUBCUTANEOUS | Status: DC
  Administered 2019-09-22 – 2019-09-24 (×3): 40 mg via SUBCUTANEOUS
  Filled 2019-09-22 (×4): qty 0.4

## 2019-09-22 MED ORDER — BUTAMBEN-TETRACAINE-BENZOCAINE 2-2-14 % EX AERO
3.0000 | INHALATION_SPRAY | Freq: Once | CUTANEOUS | Status: DC
  Filled 2019-09-22: qty 5

## 2019-09-22 MED ORDER — ALPRAZOLAM 0.5 MG PO TABS
0.5000 mg | ORAL_TABLET | Freq: Three times a day (TID) | ORAL | Status: DC | PRN
  Administered 2019-09-22 – 2019-09-25 (×7): 0.5 mg via ORAL
  Filled 2019-09-22 (×7): qty 1

## 2019-09-22 MED ORDER — ONDANSETRON 4 MG PO TBDP
4.0000 mg | ORAL_TABLET | Freq: Four times a day (QID) | ORAL | Status: DC | PRN
  Filled 2019-09-22: qty 1

## 2019-09-22 MED ORDER — DOCUSATE SODIUM 100 MG PO CAPS: 100.0000 mg | ORAL_CAPSULE | Freq: Two times a day (BID) | ORAL | Status: DC | PRN

## 2019-09-22 MED ORDER — FLUTICASONE PROPIONATE 50 MCG/ACT NA SUSP
1.0000 | Freq: Every day | NASAL | Status: DC | PRN
  Filled 2019-09-22: qty 16

## 2019-09-22 MED ORDER — MORPHINE SULFATE (PF) 2 MG/ML IV SOLN
2.0000 mg | INTRAVENOUS | Status: DC | PRN
  Administered 2019-09-22 (×3): 2 mg via INTRAVENOUS
  Filled 2019-09-22 (×3): qty 1

## 2019-09-22 MED ORDER — ALBUTEROL SULFATE (2.5 MG/3ML) 0.083% IN NEBU: 3.0000 mL | INHALATION_SOLUTION | Freq: Four times a day (QID) | RESPIRATORY_TRACT | Status: DC | PRN

## 2019-09-22 MED ORDER — SODIUM CHLORIDE 0.9 % IV SOLN: INTRAVENOUS | Status: DC

## 2019-09-22 NOTE — Progress Notes (Signed)
Patient has a bottle of mixed medications at bedside.  She reports that it contains celexa, ibuprofen, xanax, and a "pain pill" that patient states she does not remember exactly what medication it is or the dosage. Medications taken to pharmacy by charge RN and floor RN with patient's consent however unable to perform count at bedside with charge rn because not all of the medications are identifiable.  Will defer to pharmacy

## 2019-09-22 NOTE — Progress Notes (Signed)
Reinsert NG tube as per Dr. Bary Castilla; Nurse entered room to insert NG tube, patient states she has been using the incentive spirometer, and now ""i'm passing gas and my stomach pain is gone" pt encouraged to comply with medical intervention of NG tube for decompression, however patient continues to refuse RN to insert. Dr. Bary Castilla aware. Pt states  "maybe later when I need it I'll let you put it (NG tube) in."

## 2019-09-22 NOTE — Progress Notes (Signed)
Patient with recurrent admissions for partial SBO.  Symptoms started earlier this week. Normal BM yesterday, still passing gas. Reports "dry heaves".   Crampy pain.  Lungs: Clear. Cardio: RR. ABD: Modest distension, mild diffuse tenderness. No hernias. Extrem: Soft.  Pedal pulses: Intact.  Plain films of today and CT (admission) reviewed.  IMP: PSBO, recurrent.   Patient has had multiple episodes over the last few years, intervention has been deferred (elective lysis) secondary to Covid.   Goal: Resolution of acute process, elective exploration.  Patient now agreeable to NG.   Plan: Observation.

## 2019-09-23 LAB — COMPREHENSIVE METABOLIC PANEL
ALT: 14 U/L (ref 0–44)
AST: 20 U/L (ref 15–41)
Albumin: 3.7 g/dL (ref 3.5–5.0)
Alkaline Phosphatase: 58 U/L (ref 38–126)
Anion gap: 7 (ref 5–15)
BUN: 14 mg/dL (ref 8–23)
CO2: 24 mmol/L (ref 22–32)
Calcium: 8.6 mg/dL — ABNORMAL LOW (ref 8.9–10.3)
Chloride: 108 mmol/L (ref 98–111)
Creatinine, Ser: 0.85 mg/dL (ref 0.44–1.00)
GFR calc Af Amer: >60 mL/min (ref >60)
GFR calc non Af Amer: >60 mL/min (ref >60)
Glucose, Bld: 145 mg/dL — ABNORMAL HIGH (ref 70–99)
Potassium: 3.8 mmol/L (ref 3.5–5.1)
Sodium: 139 mmol/L (ref 135–145)
Total Bilirubin: 1 mg/dL (ref 0.3–1.2)
Total Protein: 6.8 g/dL (ref 6.5–8.1)

## 2019-09-23 LAB — CBC WITH DIFFERENTIAL/PLATELET
Abs Immature Granulocytes: 0.04 10*3/uL (ref 0.00–0.07)
Basophils Absolute: 0 10*3/uL (ref 0.0–0.1)
Basophils Relative: 0 %
Eosinophils Absolute: 0 10*3/uL (ref 0.0–0.5)
Eosinophils Relative: 0 %
HCT: 45.8 % (ref 36.0–46.0)
Hemoglobin: 15.5 g/dL — ABNORMAL HIGH (ref 12.0–15.0)
Immature Granulocytes: 0 %
Lymphocytes Relative: 15 %
Lymphs Abs: 1.4 10*3/uL (ref 0.7–4.0)
MCH: 31 pg (ref 26.0–34.0)
MCHC: 33.8 g/dL (ref 30.0–36.0)
MCV: 91.6 fL (ref 80.0–100.0)
Monocytes Absolute: 0.8 10*3/uL (ref 0.1–1.0)
Monocytes Relative: 8 %
Neutro Abs: 7.2 10*3/uL (ref 1.7–7.7)
Neutrophils Relative %: 77 %
Platelets: 268 10*3/uL (ref 150–400)
RBC: 5 MIL/uL (ref 3.87–5.11)
RDW: 14 % (ref 11.5–15.5)
WBC: 9.5 10*3/uL (ref 4.0–10.5)
nRBC: 0 % (ref 0.0–0.2)

## 2019-09-23 LAB — PHOSPHORUS: Phosphorus: 2.4 mg/dL — ABNORMAL LOW (ref 2.5–4.6)

## 2019-09-23 LAB — MAGNESIUM: Magnesium: 2.4 mg/dL (ref 1.7–2.4)

## 2019-09-23 NOTE — Progress Notes (Signed)
AVSS.   Declined NG yesterday.  AM labs show HDB back to baseline.  Lytes, Renal function fine. Alb: 3.7 Lungs: Clear. Cardio: RR. ABD: Distended,soft. More BS.  Reviewed goal of resolution of SBO and elective laparoscopy vs laparotomy.  Encouraged ambulation.   Will recheck plain films in AM.

## 2019-09-23 NOTE — Plan of Care (Signed)

## 2019-09-24 ENCOUNTER — Inpatient Hospital Stay: Payer: Medicare Other

## 2019-09-24 LAB — PHOSPHORUS: Phosphorus: 1.9 mg/dL — ABNORMAL LOW (ref 2.5–4.6)

## 2019-09-24 LAB — MAGNESIUM: Magnesium: 2.1 mg/dL (ref 1.7–2.4)

## 2019-09-24 MED ORDER — POTASSIUM PHOSPHATES 15 MMOLE/5ML IV SOLN
10.0000 mmol | Freq: Once | INTRAVENOUS | Status: AC
  Administered 2019-09-24: 10 mmol via INTRAVENOUS
  Filled 2019-09-24: qty 3.33

## 2019-09-24 NOTE — Discharge Instructions (Signed)
Bowel Obstruction °A bowel obstruction means that something is blocking the small or large bowel. The bowel is also called the intestine. It is the long tube that connects the stomach to the opening of the butt (anus). When something blocks the bowel, food and fluids cannot pass through like normal. This condition needs to be treated. Treatment depends on the cause of the problem and how bad the problem is. °What are the causes? °Common causes of this condition include: °· Scar tissue (adhesions) from past surgery or from high-energy X-rays (radiation). °· Recent surgery in the belly. This affects how food moves in the bowel. °· Some diseases, such as: °? Irritation of the lining of the digestive tract (Crohn's disease). °? Irritation of small pouches in the bowel (diverticulitis). °· Growths or tumors. °· A bulging organ (hernia). °· Twisting of the bowel (volvulus). °· A foreign body. °· Slipping of a part of the bowel into another part (intussusception). °What are the signs or symptoms? °Symptoms of this condition include: °· Pain in the belly. °· Feeling sick to your stomach (nauseous). °· Throwing up (vomiting). °· Bloating in the belly. °· Being unable to pass gas. °· Trouble pooping (constipation). °· Watery poop (diarrhea). °· A lot of belching. °How is this diagnosed? °This condition may be diagnosed based on: °· A physical exam. °· Medical history. °· Imaging tests, such as X-ray or CT scan. °· Blood tests. °· Urine tests. °How is this treated? °Treatment for this condition may include: °· Fluids and pain medicines that are given through an IV tube. Your doctor may tell you not to eat or drink if you feel sick to your stomach and are throwing up. °· Eating a clear liquid diet for a few days. °· Putting a small tube (nasogastric tube) into the stomach. This will help with pain, discomfort, and nausea by removing blocked air and fluids from the stomach. °· Surgery. This may be needed if other treatments do  not work. °Follow these instructions at home: °Medicines °· Take over-the-counter and prescription medicines only as told by your doctor. °· If you were prescribed an antibiotic medicine, take it as told by your doctor. Do not stop taking the antibiotic even if you start to feel better. °General instructions °· Follow your diet as told by your doctor. You may need to: °? Only drink clear liquids until you start to get better. °? Avoid solid foods. °· Return to your normal activities as told by your doctor. Ask your doctor what activities are safe for you. °· Do not sit for a long time without moving. Get up to take short walks every 1-2 hours. This is important. Ask for help if you feel weak or unsteady. °· Keep all follow-up visits as told by your doctor. This is important. °How is this prevented? °After having a bowel obstruction, you may be more likely to have another. You can do some things to stop it from happening again. °· If you have a long-term (chronic) disease, contact your doctor if you see changes or problems. °· Take steps to prevent or treat trouble pooping. Your doctor may ask that you: °? Drink enough fluid to keep your pee (urine) pale yellow. °? Take over-the-counter or prescription medicines. °? Eat foods that are high in fiber. These include beans, whole grains, and fresh fruits and vegetables. °? Limit foods that are high in fat and sugar. These include fried or sweet foods. °· Stay active. Ask your doctor which exercises are   safe for you. °· Avoid stress. °· Eat three small meals and three small snacks each day. °· Work with a food expert (dietitian) to make a meal plan that works for you. °· Do not use any products that contain nicotine or tobacco, such as cigarettes and e-cigarettes. If you need help quitting, ask your doctor. °Contact a doctor if: °· You have a fever. °· You have chills. °Get help right away if: °· You have pain or cramps that get worse. °· You throw up blood. °· You are  sick to your stomach. °· You cannot stop throwing up. °· You cannot drink fluids. °· You feel mixed up (confused). °· You feel very thirsty (dehydrated). °· Your belly gets more bloated. °· You feel weak or you pass out (faint). °Summary °· A bowel obstruction means that something is blocking the small or large bowel. °· Treatment may include IV fluids and pain medicine. You may also have a clear liquid diet, a small tube in your stomach, or surgery. °· Drink clear liquids and avoid solid foods until you get better. °This information is not intended to replace advice given to you by your health care provider. Make sure you discuss any questions you have with your health care provider. °Document Revised: 06/18/2017 Document Reviewed: 06/18/2017 °Elsevier Patient Education © 2020 [employer] ° °

## 2019-09-24 NOTE — Progress Notes (Signed)
Subjective:  CC: [name] is a 74 y.o. female  Hospital stay day 3,   SBO  HPI: No issues overnight.  Having bowel movements, pain resolved.  ROS:  General: Denies weight loss, weight gain, fatigue, fevers, chills, and night sweats. Heart: Denies chest pain, palpitations, racing heart, irregular heartbeat, leg pain or swelling, and decreased activity tolerance. Respiratory: Denies breathing difficulty, shortness of breath, wheezing, cough, and sputum. GI: Denies change in appetite, heartburn, nausea, vomiting, constipation, diarrhea, and blood in stool. GU: Denies difficulty urinating, pain with urinating, urgency, frequency, blood in urine.   Objective:   Temp:  [97.7 F (36.5 C)-98.8 F (37.1 C)] 98 F (36.7 C) (08/06 0730) Pulse Rate:  [67-76] 68 (08/06 0730) Resp:  [17-18] 18 (08/06 0730) BP: (125-161)/(65-80) 137/74 (08/06 0730) SpO2:  [98 %-100 %] 98 % (08/06 0730)     Height: 5\' 3"  (160 cm) Weight: 78.5 kg BMI (Calculated): 30.66   Intake/Output this shift:   Intake/Output Summary (Last 24 hours) at 09/24/2019 1201 Last data filed at 09/24/2019 1032 Gross per 24 hour  Intake 2622 ml  Output --  Net 2622 ml    Constitutional :  alert, cooperative, appears stated age and no distress  Respiratory:  clear to auscultation bilaterally  Cardiovascular:  regular rate and rhythm  Gastrointestinal: soft, non-tender; bowel sounds normal; no masses,  no organomegaly.   Skin: Cool and moist.   Psychiatric: Normal affect, non-agitated, not confused       LABS:  CMP Latest Ref Rng & Units 09/23/2019 09/21/2019 12/22/2018  Glucose 70 - 99 mg/dL 145(H) 165(H) 93  BUN 8 - 23 mg/dL 14 16 19   Creatinine 0.44 - 1.00 mg/dL 0.85 0.88 0.83  Sodium 135 - 145 mmol/L 139 137 135  Potassium 3.5 - 5.1 mmol/L 3.8 3.7 3.2(L)  Chloride 98 - 111 mmol/L 108 103 101  CO2 22 - 32 mmol/L 24 23 26   Calcium 8.9 - 10.3 mg/dL 8.6(L) 9.6 8.5(L)  Total Protein 6.5 - 8.1 g/dL 6.8 - -  Total Bilirubin  0.3 - 1.2 mg/dL 1.0 - -  Alkaline Phos 38 - 126 U/L 58 - -  AST 15 - 41 U/L 20 - -  ALT 0 - 44 U/L 14 - -   CBC Latest Ref Rng & Units 09/23/2019 09/21/2019 12/22/2018  WBC 4.0 - 10.5 K/uL 9.5 9.9 8.7  Hemoglobin 12.0 - 15.0 g/dL 15.5(H) 17.0(H) 14.9  Hematocrit 36 - 46 % 45.8 47.4(H) 43.3  Platelets 150 - 400 K/uL 268 284 226    RADS: CLINICAL DATA:  Small bowel obstruction.  EXAM: ABDOMEN - 2 VIEW  COMPARISON:  September 22, 2019.  FINDINGS: Significantly decreased small bowel dilatation is noted suggesting improving ileus or obstruction. No colonic dilatation is noted. There is no evidence of free air. No radio-opaque calculi or other significant radiographic abnormality is seen.  IMPRESSION: Significantly decreased small bowel dilatation is noted suggesting improving ileus or obstruction.   Electronically Signed   By: Marijo Conception M.D.   On: 09/24/2019 08:02 Assessment:   SBO, resolving.  Advance to clear liquid diet.  Hopefully tolerates today and can advance to regular tomorrow.  She can be discharged later tomorrow if tolerating regular diet.  She will follow up with Dr. Bary Castilla original surgeon for follow-up regarding possible surgery to prevent another small bowel obstruction.

## 2019-09-25 DIAGNOSIS — K56609 Unspecified intestinal obstruction, unspecified as to partial versus complete obstruction: Secondary | ICD-10-CM

## 2019-09-25 LAB — PHOSPHORUS: Phosphorus: 2.8 mg/dL (ref 2.5–4.6)

## 2019-09-25 NOTE — Discharge Summary (Signed)
Patient ID: MERILEE WIBLE MRN: [id number] DOB/AGE: Jan 11, 1946 74 y.o.  Admit date: 09/21/2019 Discharge date: 09/25/2019   Discharge Diagnoses:  Active Problems:   SBO (small bowel obstruction) ([facility])   Small bowel obstruction ([facility])   Procedures:  None  Hospital Course:  Patient was admitted on 09/21/19 with small bowel obstruction.  She declined NG tube placement but she regained bowel function and started having flatus on 8/4.  Her diet was slowly advanced and she currently is tolerating a soft diet.  She will be discharged home in good condition, without any nausea, vomiting, or abdominal pain.  Follow up with Dr. Bary Castilla  Consults: None  Disposition: Discharge disposition: 01-Home or Self Care       Discharge Instructions    Call MD for:  difficulty breathing, headache or visual disturbances   Complete by: As directed    Call MD for:  persistant nausea and vomiting   Complete by: As directed    Call MD for:  severe uncontrolled pain   Complete by: As directed    Call MD for:  temperature >100.4   Complete by: As directed    Diet - low sodium heart healthy   Complete by: As directed    Increase activity slowly   Complete by: As directed      Allergies as of 09/25/2019      Reactions   Levofloxacin    Tongue and mouth swelling   Calcium Channel Blockers    Other reaction(s): Dizziness   Amoxicillin Other (See Comments)   Has patient had a PCN reaction causing immediate rash, facial/tongue/throat swelling, SOB or lightheadedness with hypotension: No Has patient had a PCN reaction causing severe rash involving mucus membranes or skin necrosis: No Has patient had a PCN reaction that required hospitalization: No Has patient had a PCN reaction occurring within the last 10 years: No If all of the above answers are "NO", then may proceed with Cephalosporin use.   Nickel Rash   Penicillins Other (See Comments)   Has patient had a PCN reaction causing immediate rash,  facial/tongue/throat swelling, SOB or lightheadedness with hypotension: No Has patient had a PCN reaction causing severe rash involving mucus membranes or skin necrosis: No Has patient had a PCN reaction that required hospitalization: No Has patient had a PCN reaction occurring within the last 10 years: No If all of the above answers are "NO", then may proceed with Cephalosporin use.      Medication List    TAKE these medications   albuterol 108 (90 Base) MCG/ACT inhaler Commonly known as: VENTOLIN HFA TAKE 2 PUFFS BY MOUTH EVERY 6 HOURS AS NEEDED FOR WHEEZE OR SHORTNESS OF BREATH   ALPRAZolam 0.5 MG tablet Commonly known as: XANAX TAKE 1 TABLET (0.5 MG TOTAL) BY MOUTH 3 (THREE) TIMES DAILY AS NEEDED FOR ANXIETY OR SLEEP.   aspirin EC 81 MG tablet Take 81 mg by mouth daily.   citalopram 10 MG tablet Commonly known as: CELEXA TAKE 1 TABLET BY MOUTH EVERY DAY   fluticasone 50 MCG/ACT nasal spray Commonly known as: FLONASE PLACE 1-2 SPRAYS INTO BOTH NOSTRILS DAILY AS NEEDED FOR ALLERGIES OR RHINITIS.   ibuprofen 800 MG tablet Commonly known as: ADVIL ONE TABLET DAILY AS NEEDED. TRY TO MINIMIZE USE TO SEVERE PAIN.   omeprazole 20 MG capsule Commonly known as: PRILOSEC Take 20 mg by mouth daily as needed (acid reflux).   venlafaxine XR 37.5 MG 24 hr capsule Commonly known as: EFFEXOR-XR Take 37.5  mg by mouth daily.       Follow-up Information    Byrnett, Forest Gleason, MD Follow up in 2 week(s).   Specialties: General Surgery, Radiology Why: inpt f/u for SBO Contact information: [place] [address] [phone]

## 2019-09-25 NOTE — Progress Notes (Signed)
09/25/2019  Subjective: No acute events overnight.  Patient had a bowel movement this morning.  Tolerating clears well.  No nausea or vomiting.  Vital signs: Temp:  [97.9 F (36.6 C)-98.2 F (36.8 C)] 98.2 F (36.8 C) (08/07 0748) Pulse Rate:  [56-63] 56 (08/07 0748) Resp:  [15-20] 16 (08/07 0748) BP: (132-153)/(74-78) 132/76 (08/07 0748) SpO2:  [100 %] 100 % (08/07 0748)   Intake/Output: 08/06 0701 - 08/07 0700 In: 125.9 [IV Piggyback:125.9] Out: -  Last BM Date: 09/24/19  Physical Exam: Constitutional: No acute distress Abdomen: Soft, nondistended, nontender to palpation  Labs:  Recent Labs    09/23/19 0641  WBC 9.5  HGB 15.5*  HCT 45.8  PLT 268   Recent Labs    09/23/19 0641  NA 139  K 3.8  CL 108  CO2 24  GLUCOSE 145*  BUN 14  CREATININE 0.85  CALCIUM 8.6*   No results for input(s): LABPROT, INR in the last 72 hours.  Imaging: No results found.  Assessment/Plan: This is a 74 y.o. female admitted with small bowel obstruction, now resolving.  -We will advance diet to soft diet today.  If she's doing well this afternoon, will DC home.   Melvyn Neth, [name]

## 2019-09-25 NOTE — Progress Notes (Signed)
IV potassium phosphate suspended due to swelling and pain at IV site. IV discontinued and patient refused another IV insertion. Message sent to on-call.

## 2019-09-29 ENCOUNTER — Ambulatory Visit (INDEPENDENT_AMBULATORY_CARE_PROVIDER_SITE_OTHER): Payer: Medicare Other | Admitting: *Deleted

## 2019-09-29 ENCOUNTER — Telehealth: Payer: Self-pay | Admitting: Family Medicine

## 2019-09-29 DIAGNOSIS — F329 Major depressive disorder, single episode, unspecified: Secondary | ICD-10-CM

## 2019-09-29 DIAGNOSIS — F419 Anxiety disorder, unspecified: Secondary | ICD-10-CM

## 2019-09-29 NOTE — Telephone Encounter (Signed)
Copied from [place] [phone]. Topic: Medicare AWV >> Sep 29, 2019 12:50 PM Cher Nakai R wrote: Reason for CRM:   No answer-no voicemail Tried contacting patient to confirm appt is to be done by phone due to [facility] working [place] 09/30/19-srs

## 2019-09-29 NOTE — Progress Notes (Signed)
Subjective:   [name] is a 74 y.o. female who presents for Medicare Annual (Subsequent) preventive examination.  I connected with Sheela Stack today by telephone and verified that I am speaking with the correct person using two identifiers. Location patient: home Location provider: work Persons participating in the virtual visit: patient, provider.   I discussed the limitations, risks, security and privacy concerns of performing an evaluation and management service by telephone and the availability of in person appointments. I also discussed with the patient that there may be a patient responsible charge related to this service. The patient expressed understanding and verbally consented to this telephonic visit.    Interactive audio and video telecommunications were attempted between this provider and patient, however failed, due to patient having technical difficulties OR patient did not have access to video capability.  We continued and completed visit with audio only.   Review of Systems    N/A  Cardiac Risk Factors include: advanced age (>47men, >27 women);dyslipidemia;hypertension     Objective:    There were no vitals filed for this visit. There is no height or weight on file to calculate BMI.  Advanced Directives 09/30/2019 09/22/2019 09/22/2019 09/21/2019 12/20/2018 12/19/2018 12/17/2018  Does Patient Have a Medical Advance Directive? No - Yes No Yes Yes No  Type of Advance Directive - [place] will Living will -  Does patient want to make changes to medical advance directive? (No Data) No - Patient declined - - No - Patient declined - -  Copy of [place] in Chart? - No - copy requested No - copy requested - - - -  Would patient like information on creating a medical advance directive? No - Patient declined - No - Patient declined No - Patient declined - - -    Current Medications  (verified) Outpatient Encounter Medications as of 09/30/2019  Medication Sig  . albuterol (VENTOLIN HFA) 108 (90 Base) MCG/ACT inhaler TAKE 2 PUFFS BY MOUTH EVERY 6 HOURS AS NEEDED FOR WHEEZE OR SHORTNESS OF BREATH  . ALPRAZolam (XANAX) 0.5 MG tablet TAKE 1 TABLET (0.5 MG TOTAL) BY MOUTH 3 (THREE) TIMES DAILY AS NEEDED FOR ANXIETY OR SLEEP.  [name] aspirin EC 81 MG tablet Take 81 mg by mouth daily.  . citalopram (CELEXA) 10 MG tablet TAKE 1 TABLET BY MOUTH EVERY DAY  . fluticasone (FLONASE) 50 MCG/ACT nasal spray PLACE 1-2 SPRAYS INTO BOTH NOSTRILS DAILY AS NEEDED FOR ALLERGIES OR RHINITIS.  [name] ibuprofen (ADVIL) 800 MG tablet ONE TABLET DAILY AS NEEDED. TRY TO MINIMIZE USE TO SEVERE PAIN.  [name] omeprazole (PRILOSEC) 20 MG capsule Take 20 mg by mouth daily as needed (acid reflux).  (Patient not taking: Reported on 09/30/2019)  . venlafaxine XR (EFFEXOR-XR) 37.5 MG 24 hr capsule Take 37.5 mg by mouth daily. (Patient not taking: Reported on 09/21/2019)   No facility-administered encounter medications on file as of 09/30/2019.    Allergies (verified) Levofloxacin, Calcium channel blockers, Amoxicillin, Nickel, and Penicillins   History: Past Medical History:  Diagnosis Date  . Anxiety   . Arthritis    lower spine  . Back pain    lower back - S/P lifting injury  . Complication of anesthesia    makes hair brittle  . GERD (gastroesophageal reflux disease)   . Hyperlipidemia   . Hypertension   . Neuromuscular disorder (HCC)    numbness legs and feet s/p lower back injury  .  Partial bowel obstruction ([place]) 02/04/2018  . Stroke [facility])    "mini - strokes" 2009 - no deficit  . Vertigo    none recently  . Vitamin D deficiency   . Wears contact lenses    Past Surgical History:  Procedure Laterality Date  . ABDOMINAL HYSTERECTOMY  1987  . CARDIAC CATHETERIZATION  02/2007  . COLON SURGERY  03/12/2012   Dr Phylis Bougie  . COLONOSCOPY WITH PROPOFOL N/A 09/26/2014   Procedure: COLONOSCOPY WITH PROPOFOL;   Surgeon: Lucilla Lame, MD;  Location: [place];  Service: Endoscopy;  Laterality: N/A;  marker (tattoo) used in colon  . ESOPHAGEAL DILATION N/A 02/02/2016   Procedure: ESOPHAGEAL DILATION;  Surgeon: Lucilla Lame, MD;  Location: [place];  Service: Endoscopy;  Laterality: N/A;  . ESOPHAGOGASTRODUODENOSCOPY (EGD) WITH PROPOFOL N/A 02/02/2016   Procedure: ESOPHAGOGASTRODUODENOSCOPY (EGD) WITH PROPOFOL;  Surgeon: Lucilla Lame, MD;  Location: [place];  Service: Endoscopy;  Laterality: N/A;  . POLYPECTOMY  09/26/2014   Procedure: POLYPECTOMY;  Surgeon: Lucilla Lame, MD;  Location: [place];  Service: Endoscopy;;  . TUBAL LIGATION  1972   Family History  Problem Relation Age of Onset  . Diabetes Mother   . Heart disease Mother   . Hypertension Mother   . Mental illness Mother   . Cancer Father        lung cancer  . Drug abuse Brother   . Multiple sclerosis Brother    Social History   Socioeconomic History  . Marital status: Married    Spouse name: Not on file  . Number of children: 3  . Years of education: Not on file  . Highest education level: 12th grade  Occupational History  . Occupation: retired  Tobacco Use  . Smoking status: Never Smoker  . Smokeless tobacco: Never Used  Vaping Use  . Vaping Use: Never used  Substance and Sexual Activity  . Alcohol use: No    Alcohol/week: 0.0 standard drinks  . Drug use: No  . Sexual activity: Not Currently  Other Topics Concern  . Not on file  Social History Narrative  . Not on file   Social Determinants of Health   Financial Resource Strain: Low Risk   . Difficulty of Paying Living Expenses: Not hard at all  Food Insecurity: No Food Insecurity  . Worried About Charity fundraiser in the Last Year: Never true  . Ran Out of Food in the Last Year: Never true  Transportation Needs: No Transportation Needs  . Lack of Transportation (Medical): No  . Lack of Transportation (Non-Medical): No   Physical Activity: Inactive  . Days of Exercise per Week: 0 days  . Minutes of Exercise per Session: 0 min  Stress: No Stress Concern Present  . Feeling of Stress : Only a little  Social Connections: Moderately Integrated  . Frequency of Communication with Friends and Family: More than three times a week  . Frequency of Social Gatherings with Friends and Family: More than three times a week  . Attends Religious Services: More than 4 times per year  . Active Member of Clubs or Organizations: No  . Attends Archivist Meetings: Never  . Marital Status: Married    Tobacco Counseling Counseling given: Not Answered   Clinical Intake:  Pre-visit preparation completed: Yes  Pain : No/denies pain     Nutritional Risks: None Diabetes: No  How often do you need to have someone help you when you read instructions, pamphlets,  or other written materials from your doctor or pharmacy?: 1 - Never  Diabetic? No  Interpreter Needed?: No  Information entered by :: [facility], LPN   Activities of Daily Living In your present state of health, do you have any difficulty performing the following activities: 09/30/2019 09/22/2019  Hearing? N N  Vision? N N  Comment Wears eye glasses. -  Difficulty concentrating or making decisions? N N  Walking or climbing stairs? N N  Dressing or bathing? N N  Doing errands, shopping? N N  Preparing Food and eating ? N -  Using the Toilet? N -  In the past six months, have you accidently leaked urine? N -  Do you have problems with loss of bowel control? N -  Managing your Medications? N -  Managing your Finances? N -  Housekeeping or managing your Housekeeping? N -  Some recent data might be hidden    Patient Care Team: Birdie Sons, MD as PCP - General (Family Medicine) Lucilla Lame, MD as Consulting Physician (Gastroenterology) Bary Castilla [place] Gleason, MD as Consulting Physician (General Surgery) [place], Darla Lesches, [place] as Social  Worker [place], Marinette B, OD (Optometry)  Indicate any recent Medical Services you may have received from other than [facility] providers in the past year (date may be approximate).     Assessment:   This is a routine wellness examination for [place].  Hearing/Vision screen No exam data present  Dietary issues and exercise activities discussed: Current Exercise Habits: The patient does not participate in regular exercise at present, Exercise limited by: None identified  Goals      Patient Stated   .  "I would like to manage my depression better" (pt-stated)      Current Barriers:  [name] Mental Health Concerns   Clinical Social Work Clinical Goal(s):  [name] Over the next 90 days, client will work with SW to address concerns related to depression  Interventions: . Followed up with patient in regards to her management of stress and depression . Patient continues to experience multiple losses in her family and now recent hospitalization . Discussed positive coping strategies-including separating her self from situations that may cause increased stress . Patient discussed that she is beginning to manage her expectations better and is making more of an effort to practice healthy boundaries . Continued to provided patient with positive reinforcement and emotional support related to the death of her brother and recent loss of additional family members  emphasizing use positive coping including letting negative relationships go that were not healthy for her . Discussed plans with patient for ongoing care management follow up and provided patient with direct contact information for care management team  Patient Self Care Activities:  . Performs ADL's independently . Performs IADL's independently . Calls provider office for new concerns or questions  Please see past updates related to this goal by clicking on the "Past Updates" button in the selected goal        Other   .  DIET - REDUCE PORTION SIZE       Recommend decreasing portion sizes in half and eating 3 small meals a day with 2 healthy snacks in between.     .  Eat more fruits and vegetables      Recommend increasing consumption of fruits and vegetables daily (2 servings).      Depression Screen PHQ 2/9 Scores 09/30/2019 01/01/2019 09/29/2018 09/02/2018 10/01/2017 09/26/2017 09/25/2016  PHQ - 2 Score 0 0 0 1 6 6  6  PHQ- 9 Score - 2 - - 18 18 24     Fall Risk Fall Risk  09/30/2019 01/01/2019 09/29/2018 10/01/2017 09/26/2017  Falls in the past year? 0 1 0 Yes Yes  Number falls in past yr: 0 0 - 2 or more 1  Injury with Fall? 0 0 - - No  Follow up - - - - Falls prevention discussed    Any stairs in or around the home? No  If so, are there any without handrails? No  Home free of loose throw rugs in walkways, pet beds, electrical cords, etc? Yes  Adequate lighting in your home to reduce risk of falls? Yes   ASSISTIVE DEVICES UTILIZED TO PREVENT FALLS:  Life alert? No  Use of a cane, walker or w/c? No  Grab bars in the bathroom? No  Shower chair or bench in shower? Yes Elevated toilet seat or a handicapped toilet? Yes    Cognitive Function: Declined today.     6CIT Screen 09/25/2016  What Year? 0 points  What month? 0 points  What time? 0 points  Count back from 20 0 points  Months in reverse 0 points  Repeat phrase 2 points  Total Score 2    Immunizations Immunization History  Administered Date(s) Administered  . Pneumococcal Conjugate-13 01/19/2014    TDAP status: Due, Education has been provided regarding the importance of this vaccine. Advised may receive this vaccine at local pharmacy or Health Dept. Aware to provide a copy of the vaccination record if obtained from local pharmacy or Health Dept. Verbalized acceptance and understanding. Flu Vaccine status: Due fall 2021 Pneumococcal vaccine status: Declined,  Education has been provided regarding the importance of this vaccine but patient still declined. Advised may  receive this vaccine at local pharmacy or Health Dept. Aware to provide a copy of the vaccination record if obtained from local pharmacy or Health Dept. Verbalized acceptance and understanding.  Covid-19 vaccine status: Declined, Education has been provided regarding the importance of this vaccine but patient still declined. Advised may receive this vaccine at local pharmacy or Health Dept.or vaccine clinic. Aware to provide a copy of the vaccination record if obtained from local pharmacy or Health Dept. Verbalized acceptance and understanding.  Qualifies for Shingles Vaccine? Yes   Zostavax completed No   Shingrix Completed?: No.    Education has been provided regarding the importance of this vaccine. Patient has been advised to call insurance company to determine out of pocket expense if they have not yet received this vaccine. Advised may also receive vaccine at local pharmacy or Health Dept. Verbalized acceptance and understanding.  Screening Tests Health Maintenance  Topic Date Due  . MAMMOGRAM  Never done  . PNA vac Low Risk Adult (2 of 2 - PPSV23) 01/20/2015  . COLONOSCOPY  09/25/2017  . INFLUENZA VACCINE  09/19/2019  . COVID-19 Vaccine (1) 10/16/2019 (Originally 11/16/1957)  . TETANUS/TDAP  02/18/2026 (Originally 11/16/1966)  . DEXA SCAN  Completed  . Hepatitis C Screening  Completed    Health Maintenance  Health Maintenance Due  Topic Date Due  . MAMMOGRAM  Never done  . PNA vac Low Risk Adult (2 of 2 - PPSV23) 01/20/2015  . COLONOSCOPY  09/25/2017  . INFLUENZA VACCINE  09/19/2019    Colorectal cancer screening: Currently due, pt to f/u with Dr Bary Castilla in 1-2 weeks to discuss colonoscopy.  Mammogram status: Currently due, declined order today.  Bone Density status: Completed 10/19/13. Results reflect: Previous DEXA scan was  normal. No repeat needed unless advised by a physician.  Lung Cancer Screening: (Low Dose CT Chest recommended if Age 57-80 years, 30 pack-year currently  smoking OR have quit w/in 15years.) does not qualify.   Additional Screening:  Hepatitis C Screening: Up to date  Vision Screening: Recommended annual ophthalmology exams for early detection of glaucoma and other disorders of the eye. Is the patient up to date with their annual eye exam?  Yes  Who is the provider or what is the name of the office in which the patient attends annual eye exams? Dr Matilde Sprang If pt is not established with a provider, would they like to be referred to a provider to establish care? No .   Dental Screening: Recommended annual dental exams for proper oral hygiene  Community Resource Referral / Chronic Care Management: CRR required this visit?  No   CCM required this visit?  No      Plan:     I have personally reviewed and noted the following in the patient's chart:   . Medical and social history . Use of alcohol, tobacco or illicit drugs  . Current medications and supplements . Functional ability and status . Nutritional status . Physical activity . Advanced directives . List of other physicians . Hospitalizations, surgeries, and ER visits in previous 12 months . Vitals . Screenings to include cognitive, depression, and falls . Referrals and appointments  In addition, I have reviewed and discussed with patient certain preventive protocols, quality metrics, and best practice recommendations. A written personalized care plan for preventive services as well as general preventive health recommendations were provided to patient.     Averey Koning [place], [place]   06/22/1362   Nurse Notes: Declined receiving a future pneumonia and Covid vaccine, mammogram order or colonoscopy referral at this time.

## 2019-09-30 ENCOUNTER — Ambulatory Visit (INDEPENDENT_AMBULATORY_CARE_PROVIDER_SITE_OTHER): Payer: Medicare Other

## 2019-09-30 ENCOUNTER — Other Ambulatory Visit: Payer: Self-pay

## 2019-09-30 DIAGNOSIS — Z0000 Encounter for general adult medical examination without abnormal findings: Secondary | ICD-10-CM | POA: Diagnosis not present

## 2019-09-30 NOTE — Chronic Care Management (AMB) (Signed)
  Chronic Care Management    Clinical Social Work Follow Up Note  09/30/2019 Name: SELETHA ZIMMERMANN MRN: [id number] DOB: 03-Apr-1945  [name] is a 74 y.o. year old female who is a primary care patient of [name], [name], MD. The CCM team was consulted for assistance with Mental Health Counseling and Resources.   Review of patient status, including review of consultants reports, other relevant assessments, and collaboration with appropriate care team members and the patient's provider was performed as part of comprehensive patient evaluation and provision of chronic care management services.    SDOH (Social Determinants of Health) assessments performed: No    Outpatient Encounter Medications as of 09/29/2019  Medication Sig Note  . albuterol (VENTOLIN HFA) 108 (90 Base) MCG/ACT inhaler TAKE 2 PUFFS BY MOUTH EVERY 6 HOURS AS NEEDED FOR WHEEZE OR SHORTNESS OF BREATH   . ALPRAZolam (XANAX) 0.5 MG tablet TAKE 1 TABLET (0.5 MG TOTAL) BY MOUTH 3 (THREE) TIMES DAILY AS NEEDED FOR ANXIETY OR SLEEP.   [name] aspirin EC 81 MG tablet Take 81 mg by mouth daily.   . citalopram (CELEXA) 10 MG tablet TAKE 1 TABLET BY MOUTH EVERY DAY   . fluticasone (FLONASE) 50 MCG/ACT nasal spray PLACE 1-2 SPRAYS INTO BOTH NOSTRILS DAILY AS NEEDED FOR ALLERGIES OR RHINITIS.   [name] ibuprofen (ADVIL) 800 MG tablet ONE TABLET DAILY AS NEEDED. TRY TO MINIMIZE USE TO SEVERE PAIN.   [name] omeprazole (PRILOSEC) 20 MG capsule Take 20 mg by mouth daily as needed (acid reflux).    . venlafaxine XR (EFFEXOR-XR) 37.5 MG 24 hr capsule Take 37.5 mg by mouth daily. (Patient not taking: Reported on 09/21/2019) 09/21/2019: Last fill was 05/13/19 for 90 day supply   No facility-administered encounter medications on file as of 09/29/2019.     Goals Addressed              This Visit's Progress   .  "I would like to manage my depression better" (pt-stated)        Current Barriers:  [name] Mental Health Concerns   Clinical Social Work Clinical Goal(s):   [name] Over the next 90 days, client will work with SW to address concerns related to depression  Interventions: . Followed up with patient in regards to her management of stress and depression . Patient continues to experience multiple losses in her family and now recent hospitalization . Discussed positive coping strategies-including separating her self from situations that may cause increased stress . Patient discussed that she is beginning to manage her expectations better and is making more of an effort to practice healthy boundaries . Continued to provided patient with positive reinforcement and emotional support related to the death of her brother and recent loss of additional family members  emphasizing use positive coping including letting negative relationships go that were not healthy for her . Discussed plans with patient for ongoing care management follow up and provided patient with direct contact information for care management team  Patient Self Care Activities:  . Performs ADL's independently . Performs IADL's independently . Calls provider office for new concerns or questions  Please see past updates related to this goal by clicking on the "Past Updates" button in the selected goal          Follow Up Plan: SW will follow up with patient by phone over the next 7-14 business days    [place], [place] Worker  [facility] Care Management [phone]

## 2019-09-30 NOTE — Patient Instructions (Addendum)
Thank you allowing the Chronic Care Management Team to be a part of your care! It was a pleasure speaking with you today!  1. Please call this social worker with any questions or concerns regarding your mental health needs.  CCM (Chronic Care Management) Team   Neldon Labella RN, BSN Nurse Care Coordinator  [phone]  [place], LCSW Clinical Social Worker [phone]  Goals Addressed              This Visit's Progress   .  "I would like to manage my depression better" (pt-stated)        Current Barriers:  [name] Mental Health Concerns   Clinical Social Work Clinical Goal(s):  [name] Over the next 90 days, client will work with SW to address concerns related to depression  Interventions: . Followed up with patient in regards to her management of stress and depression . Patient continues to experience multiple losses in her family and now recent hospitalization . Discussed positive coping strategies-including separating her self from situations that may cause increased stress . Patient discussed that she is beginning to manage her expectations better and is making more of an effort to practice healthy boundaries . Continued to provided patient with positive reinforcement and emotional support related to the death of her brother and recent loss of additional family members  emphasizing use positive coping including letting negative relationships go that were not healthy for her . Discussed plans with patient for ongoing care management follow up and provided patient with direct contact information for care management team  Patient Self Care Activities:  . Performs ADL's independently . Performs IADL's independently . Calls provider office for new concerns or questions  Please see past updates related to this goal by clicking on the "Past Updates" button in the selected goal          The patient verbalized understanding of instructions provided today and declined a  print copy of patient instruction materials.   Telephone follow up appointment with care management team member scheduled for: 10/11/19

## 2019-10-12 ENCOUNTER — Ambulatory Visit: Payer: Self-pay | Admitting: *Deleted

## 2019-10-12 DIAGNOSIS — Z86010 Personal history of colonic polyps: Secondary | ICD-10-CM | POA: Diagnosis not present

## 2019-10-12 DIAGNOSIS — K660 Peritoneal adhesions (postprocedural) (postinfection): Secondary | ICD-10-CM | POA: Diagnosis not present

## 2019-10-12 NOTE — Chronic Care Management (AMB) (Signed)
  Chronic Care Management   Social Work Note  10/12/2019 Name: [name] MRN: [id number] DOB: 1945-11-07  [name] is a 74 y.o. year old female who sees [name], [name], MD for primary care. The CCM team was consulted for assistance with Mental Health Counseling and Resources.   Met with patient briefly face to face following her appointment with Dr. Bary Castilla. Per patient, she is anticipating  having surgery to remove some scar tissue. Patient agreeable to this surgery in hopes that it will improve her condition.  SDOH (Social Determinants of Health) assessments performed: No     Outpatient Encounter Medications as of 10/12/2019  Medication Sig Note  . albuterol (VENTOLIN HFA) 108 (90 Base) MCG/ACT inhaler TAKE 2 PUFFS BY MOUTH EVERY 6 HOURS AS NEEDED FOR WHEEZE OR SHORTNESS OF BREATH   . ALPRAZolam (XANAX) 0.5 MG tablet TAKE 1 TABLET (0.5 MG TOTAL) BY MOUTH 3 (THREE) TIMES DAILY AS NEEDED FOR ANXIETY OR SLEEP.   [name] aspirin EC 81 MG tablet Take 81 mg by mouth daily.   . citalopram (CELEXA) 10 MG tablet TAKE 1 TABLET BY MOUTH EVERY DAY   . fluticasone (FLONASE) 50 MCG/ACT nasal spray PLACE 1-2 SPRAYS INTO BOTH NOSTRILS DAILY AS NEEDED FOR ALLERGIES OR RHINITIS.   [name] ibuprofen (ADVIL) 800 MG tablet ONE TABLET DAILY AS NEEDED. TRY TO MINIMIZE USE TO SEVERE PAIN.   [name] omeprazole (PRILOSEC) 20 MG capsule Take 20 mg by mouth daily as needed (acid reflux).  (Patient not taking: Reported on 09/30/2019)   . venlafaxine XR (EFFEXOR-XR) 37.5 MG 24 hr capsule Take 37.5 mg by mouth daily. (Patient not taking: Reported on 09/21/2019) 09/21/2019: Last fill was 05/13/19 for 90 day supply   No facility-administered encounter medications on file as of 10/12/2019.    Goals Addressed   None     Follow Up Plan: SW will follow up with patient by phone over the next 7-10 business days   [place], [place] Worker  [facility] Care Management [phone]

## 2019-10-13 ENCOUNTER — Other Ambulatory Visit: Payer: Self-pay | Admitting: General Surgery

## 2019-10-13 ENCOUNTER — Other Ambulatory Visit: Payer: Self-pay | Admitting: Family Medicine

## 2019-10-13 DIAGNOSIS — G8929 Other chronic pain: Secondary | ICD-10-CM

## 2019-10-13 DIAGNOSIS — M5442 Lumbago with sciatica, left side: Secondary | ICD-10-CM

## 2019-10-13 NOTE — Telephone Encounter (Signed)
Requested Prescriptions  Pending Prescriptions Disp Refills  . ibuprofen (ADVIL) 800 MG tablet [Pharmacy Med Name: IBUPROFEN 800 MG TABLET] 30 tablet 0    Sig: TAKE 1 TABLET BY MOUTH DAILY AS NEEDED (TRY TO MINIMIZE USE TO SEVERE PAIN)     Analgesics:  NSAIDS Failed - 10/13/2019  2:25 AM      Failed - HGB in normal range and within 360 days    Hemoglobin  Date Value Ref Range Status  09/23/2019 15.5 (H) 12.0 - 15.0 g/dL Final  08/11/2018 15.3 11.1 - 15.9 g/dL Final         Passed - Cr in normal range and within 360 days    Creatinine  Date Value Ref Range Status  06/05/2014 0.83 mg/dL Final    Comment:    0.44-1.00 NOTE: New Reference Range  04/26/14    Creatinine, Ser  Date Value Ref Range Status  09/23/2019 0.85 0.44 - 1.00 mg/dL Final         Passed - Patient is not pregnant      Passed - Valid encounter within last 12 months    Recent Outpatient Visits          9 months ago Elevated blood pressure reading   [facility] Birdie Sons, MD   1 year ago Major depressive disorder with single episode, remission status unspecified   [facility] Birdie Sons, MD   1 year ago [place], [name], MD   1 year ago Vitamin D deficiency   [facility], [name], MD   1 year ago Acute recurrent pansinusitis   [facility], Dionne Bucy, MD

## 2019-10-13 NOTE — Progress Notes (Signed)
Subjective:     Patient ID: [name] is a 74 y.o. female.  HPI  The following portions of the patient's history were reviewed and updated as appropriate.  This an established patient is here today for: office visit. Patient is here today to follow up from a small bowel obstruction. Here to discuss surgery. She states her bowels move every day, some days more than others, no bleeding.  The patient was last seen as an outpatient in November 2020.  At that time she had presented after a recurrent episode of small bowel obstruction that had resolved on conservative treatment.  These of occurred about every 9 months for the last several years.  She had undergone an open transverse colectomy in 2014.  The patient's most recent episode was on September 21, 2019.  She declined an NG tube during this admission, but eventually resolved and has been asymptomatic since discharge.  She presents today to discuss the possibility of diagnostic laparoscopy/laparotomy to assess for the source of her recurrent obstruction episodes.  The patient has no constitutional symptoms, unexplained weight loss, fever or chills.       Chief Complaint  Patient presents with  . Follow-up    hospital      BP 150/86   Pulse 70   Temp 36.4 C (97.6 F)   Resp 12   Ht 160 cm (5\' 3" )   Wt 76.7 kg (169 lb)   BMI 29.94 kg/m       Past Medical History:  Diagnosis Date  . Anxiety   . Arthritis   . Complication of anesthesia   . GERD (gastroesophageal reflux disease)   . Hyperlipidemia   . Hypertension   . Lower back pain    s/p lifting injury  . Neuromuscular disorder (CMS-HCC)   . Partial bowel obstruction (CMS-HCC) 02/04/2018  . Peripheral vascular disease (CMS-HCC)   . Stroke (CMS-HCC)   . Vertigo   . Vitamin D deficiency   . Wears contact lenses           Past Surgical History:  Procedure Laterality Date  . ABDOMINAL HYSTERECTOMY  1987  . cardiac cath  02/2007  . colon  surgery  03/12/2012   Dr. Phylis Bougie  . COLONOSCOPY  09/26/2014   with polypectomy  . EGD  02/02/2016   with dilation  . LAPAROSCOPIC TUBAL LIGATION  1972              OB History    Gravida  3   Para  3   Term      Preterm      AB      Living        SAB      TAB      Ectopic      Molar      Multiple      Live Births          Obstetric Comments  Age at first period 7 Age of first pregnancy 50         Social History          Socioeconomic History  . Marital status: Married    Spouse name: Not on file  . Number of children: Not on file  . Years of education: Not on file  . Highest education level: Not on file  Occupational History  . Not on file  Tobacco Use  . Smoking status: Never Smoker  . Smokeless tobacco: Never Used  Substance and  Sexual Activity  . Alcohol use: Never  . Drug use: Never  . Sexual activity: Not on file  Other Topics Concern  . Not on file  Social History Narrative  . Not on file   Social Determinants of Health      Financial Resource Strain:   . Difficulty of Paying Living Expenses:   Food Insecurity:   . Worried About Charity fundraiser in the Last Year:   . Arboriculturist in the Last Year:   Transportation Needs:   . Film/video editor (Medical):   [name] Lack of Transportation (Non-Medical):        Allergies  Allergen Reactions  . Levofloxacin Swelling    Tongue and mouth swelling  . Amoxicillin Other (See Comments)  . Calcium Channel Blocking Agent Diltiazem Analogues Dizziness    Other reaction(s): Dizziness  . Nickel Rash  . Penicillins Other (See Comments)    Gets yeast infection Has patient had a PCN reaction causing immediate rash, facial/tongue/throat swelling, SOB or lightheadedness with hypotension: No Has patient had a PCN reaction causing severe rash involving mucus membranes or skin necrosis: No Has patient had a PCN reaction that required hospitalization:  No Has patient had a PCN reaction occurring within the last 10 years: No If all of the above answers are "NO", then may proceed with Cephalosporin use. Has patient had a PCN reaction causing immediate rash, facial/tongue/throat swelling, SOB or lightheadedness with hypotension: No Has patient had a PCN reaction causing severe rash involving mucus membranes or skin necrosis: No Has patient had a PCN reaction that required hospitalization: No Has patient had a PCN reaction occurring within the last 10 years: No If all of the above answers are "NO", then may proceed with Cephalosporin use.     Current Medications        Current Outpatient Medications  Medication Sig Dispense Refill  . albuterol 90 mcg/actuation inhaler TAKE 2 PUFFS BY MOUTH EVERY 6 HOURS AS NEEDED FOR WHEEZE OR SHORTNESS OF BREATH    . ALPRAZolam (XANAX) 0.5 MG tablet 1 tablet BID    . aspirin 81 MG EC tablet Take 81 mg by mouth once daily    . citalopram (CELEXA) 20 MG tablet Take 20 mg by mouth once daily      . fluticasone propionate (FLONASE) 50 mcg/actuation nasal spray Place 2 sprays into both nostrils once daily as needed       . omeprazole (PRILOSEC) 20 MG DR capsule Take 20 mg by mouth as needed       No current facility-administered medications for this visit.           Family History  Problem Relation Age of Onset  . Coronary Artery Disease (Blocked arteries around heart) Mother   . Diabetes Mother   . High blood pressure (Hypertension) Mother   . Mental illness Mother   . Lung cancer Father   . Drug abuse Brother   . Multiple sclerosis Brother   . Colon cancer Maternal Grandmother        age 62's         Review of Systems  Constitutional: Negative for chills and fever.  Respiratory: Negative for cough.   Gastrointestinal: Negative for blood in stool, constipation, diarrhea, nausea and vomiting.        Chief Complaint  Patient presents with  . Follow-up     hospital      BP 150/86   Pulse 70   Temp  36.4 C (97.6 F)   Resp 12   Ht 160 cm (5\' 3" )   Wt 76.7 kg (169 lb)   BMI 29.94 kg/m       Past Medical History:  Diagnosis Date  . Anxiety   . Arthritis   . Complication of anesthesia   . GERD (gastroesophageal reflux disease)   . Hyperlipidemia   . Hypertension   . Lower back pain    s/p lifting injury  . Neuromuscular disorder (CMS-HCC)   . Partial bowel obstruction (CMS-HCC) 02/04/2018  . Peripheral vascular disease (CMS-HCC)   . Stroke (CMS-HCC)   . Vertigo   . Vitamin D deficiency   . Wears contact lenses           Past Surgical History:  Procedure Laterality Date  . ABDOMINAL HYSTERECTOMY  1987  . cardiac cath  02/2007  . colon surgery  03/12/2012   Dr. Phylis Bougie  . COLONOSCOPY  09/26/2014   with polypectomy  . EGD  02/02/2016   with dilation  . LAPAROSCOPIC TUBAL LIGATION  1972              OB History    Gravida  3   Para  3   Term      Preterm      AB      Living        SAB      TAB      Ectopic      Molar      Multiple      Live Births          Obstetric Comments  Age at first period 78 Age of first pregnancy 61         Social History          Socioeconomic History  . Marital status: Married    Spouse name: Not on file  . Number of children: Not on file  . Years of education: Not on file  . Highest education level: Not on file  Occupational History  . Not on file  Tobacco Use  . Smoking status: Never Smoker  . Smokeless tobacco: Never Used  Substance and Sexual Activity  . Alcohol use: Never  . Drug use: Never  . Sexual activity: Not on file  Other Topics Concern  . Not on file  Social History Narrative  . Not on file   Social Determinants of Health      Financial Resource Strain:   . Difficulty of Paying Living Expenses:   Food Insecurity:   . Worried About Charity fundraiser in the Last Year:   . Youth worker in the Last Year:   Transportation Needs:   . Film/video editor (Medical):   [name] Lack of Transportation (Non-Medical):             Allergies  Allergen Reactions  . Levofloxacin Swelling    Tongue and mouth swelling  . Amoxicillin Other (See Comments)  . Calcium Channel Blocking Agent Diltiazem Analogues Dizziness    Other reaction(s): Dizziness  . Nickel Rash  . Penicillins Other (See Comments)    Gets yeast infection Has patient had a PCN reaction causing immediate rash, facial/tongue/throat swelling, SOB or lightheadedness with hypotension: No Has patient had a PCN reaction causing severe rash involving mucus membranes or skin necrosis: No Has patient had a PCN reaction that required hospitalization: No Has patient had a PCN reaction occurring within the last 10 years:  No If all of the above answers are "NO", then may proceed with Cephalosporin use. Has patient had a PCN reaction causing immediate rash, facial/tongue/throat swelling, SOB or lightheadedness with hypotension: No Has patient had a PCN reaction causing severe rash involving mucus membranes or skin necrosis: No Has patient had a PCN reaction that required hospitalization: No Has patient had a PCN reaction occurring within the last 10 years: No If all of the above answers are "NO", then may proceed with Cephalosporin use.     Current Medications        Current Outpatient Medications  Medication Sig Dispense Refill  . albuterol 90 mcg/actuation inhaler TAKE 2 PUFFS BY MOUTH EVERY 6 HOURS AS NEEDED FOR WHEEZE OR SHORTNESS OF BREATH    . ALPRAZolam (XANAX) 0.5 MG tablet 1 tablet BID    . aspirin 81 MG EC tablet Take 81 mg by mouth once daily    . citalopram (CELEXA) 20 MG tablet Take 20 mg by mouth once daily      . fluticasone propionate (FLONASE) 50 mcg/actuation nasal spray Place 2 sprays into both nostrils once daily as needed       . omeprazole (PRILOSEC) 20 MG DR capsule Take 20  mg by mouth as needed       No current facility-administered medications for this visit.           Family History  Problem Relation Age of Onset  . Coronary Artery Disease (Blocked arteries around heart) Mother   . Diabetes Mother   . High blood pressure (Hypertension) Mother   . Mental illness Mother   . Lung cancer Father   . Drug abuse Brother   . Multiple sclerosis Brother   . Colon cancer Maternal Grandmother        age 17's       Objective:   Physical Exam Constitutional:      Appearance: Normal appearance.  Cardiovascular:     Rate and Rhythm: Normal rate and regular rhythm.     Pulses: Normal pulses.          Femoral pulses are 2+ on the right side and 2+ on the left side.    Heart sounds: Normal heart sounds.  Pulmonary:     Effort: Pulmonary effort is normal.     Breath sounds: Normal breath sounds.  Abdominal:     General: Abdomen is flat. Bowel sounds are normal.     Palpations: Abdomen is soft.     Tenderness: There is no abdominal tenderness.     Hernia: A hernia is present.    Musculoskeletal:     Cervical back: Neck supple.  Lymphadenopathy:     Lower Body: No right inguinal adenopathy. No left inguinal adenopathy.  Skin:    General: Skin is warm and dry.  Neurological:     Mental Status: She is alert and oriented to person, place, and time.  Psychiatric:        Mood and Affect: Mood normal.        Behavior: Behavior normal.    Labs and Radiology:   Pathology from the 2014 colon resection showed a 6 mm tubular adenoma.    During her hospitalization CBC showed a hemoglobin of 15.5, MCV of 91.6.  White blood cell count of 9500.  Platelet count 268,000.  Comprehensive metabolic panel was notable for a modest elevation of blood sugar 145.  Normal electrolytes.  Normal renal function.  The patient's colonoscopy of September 26, 2014 completed  by Aundria Rud, MD was reviewed.  He describes a 20 mm polyp at the anastomosis.   Partial resection.  Multiple sigmoid diverticuli.  Pathology of the area describes granulation tissue only.  A 3-year follow-up was recommended.  Colon, polyp(s), transverse - POLYPOID, INFLAMED GRANULATION TISSUE. - NO ADENOMATOUS CHANGE OR MALIGNANCY IDENTIFIED.  CT scans of the abdomen and pelvis of August 2021 and October 2020 have been independently reviewed.  Pictures consistent with partial small bowel obstruction with transition points from the mid abdomen to the pelvis on various studies.    Assessment:     Previous resection of the transverse colon for a small adenomatous polyp, 2016 colonoscopy showing a 20 mm area of inflamed granulation tissue.    Plan:     The patient has had multiple episodes of partial obstruction which have all resolved, but are recurring about every 9 months.  With her past history of colon polyps, and the results of the 2016 exam, I think she would be well served by repeat colonoscopy to by the GI service or myself.  I discussed in detail a suggestion for diagnostic laparoscopy with the possibility of identifying the source of obstruction when she is asymptomatic and the possibility of a formal laparotomy at that time if laparoscopy is inconclusive.  She is weathered each of these episodes of obstruction without need for acute intervention, but she is very symptomatic for the first 3 or 4 days, especially when she declines NG tube placement and has multiple episodes of vomiting.  Risks of surgical intervention include anesthesia, bleeding, intestinal injury, potential need for formal laparotomy.  Clinical exam today does suggest the possibility of a small defect at the superior edge of her midline incision.  The results of the 2016 colonoscopy were not available when the patient was in the office, and she will be contacted with recommendations for a colonoscopy prior to laparoscopy/laparotomy.  Over 1 hour was spent during today's visit with record review  and discussion of surgical options.    Entered by Ledell Noss, CMA, acting as a scribe for Dr. Hervey Ard, MD.  Entered by Karie Fetch, RN, acting as a scribe for Dr. Hervey Ard, MD.   The documentation recorded by the scribe accurately reflects the service I personally performed and the decisions made by me.   Robert Bellow, MD FACS

## 2019-10-13 NOTE — Progress Notes (Signed)
Subjective:     Patient ID: [name] is a 74 y.o. female.  HPI  The following portions of the patient's history were reviewed and updated as appropriate.  This an established patient is here today for: office visit. Patient is here today to follow up from a small bowel obstruction. Here to discuss surgery. She states her bowels move every day, some days more than others, no bleeding.  The patient was last seen as an outpatient in November 2020.  At that time she had presented after a recurrent episode of small bowel obstruction that had resolved on conservative treatment.  These of occurred about every 9 months for the last several years.  She had undergone an open transverse colectomy in 2014.  The patient's most recent episode was on September 21, 2019.  She declined an NG tube during this admission, but eventually resolved and has been asymptomatic since discharge.  She presents today to discuss the possibility of diagnostic laparoscopy/laparotomy to assess for the source of her recurrent obstruction episodes.  The patient has no constitutional symptoms, unexplained weight loss, fever or chills.       Chief Complaint  Patient presents with  . Follow-up    hospital      BP 150/86   Pulse 70   Temp 36.4 C (97.6 F)   Resp 12   Ht 160 cm (5\' 3" )   Wt 76.7 kg (169 lb)   BMI 29.94 kg/m       Past Medical History:  Diagnosis Date  . Anxiety   . Arthritis   . Complication of anesthesia   . GERD (gastroesophageal reflux disease)   . Hyperlipidemia   . Hypertension   . Lower back pain    s/p lifting injury  . Neuromuscular disorder (CMS-HCC)   . Partial bowel obstruction (CMS-HCC) 02/04/2018  . Peripheral vascular disease (CMS-HCC)   . Stroke (CMS-HCC)   . Vertigo   . Vitamin D deficiency   . Wears contact lenses           Past Surgical History:  Procedure Laterality Date  . ABDOMINAL HYSTERECTOMY  1987  . cardiac cath  02/2007  . colon  surgery  03/12/2012   Dr. Phylis Bougie  . COLONOSCOPY  09/26/2014   with polypectomy  . EGD  02/02/2016   with dilation  . LAPAROSCOPIC TUBAL LIGATION  1972              OB History    Gravida  3   Para  3   Term      Preterm      AB      Living        SAB      TAB      Ectopic      Molar      Multiple      Live Births          Obstetric Comments  Age at first period 85 Age of first pregnancy 89         Social History          Socioeconomic History  . Marital status: Married    Spouse name: Not on file  . Number of children: Not on file  . Years of education: Not on file  . Highest education level: Not on file  Occupational History  . Not on file  Tobacco Use  . Smoking status: Never Smoker  . Smokeless tobacco: Never Used  Substance and  Sexual Activity  . Alcohol use: Never  . Drug use: Never  . Sexual activity: Not on file  Other Topics Concern  . Not on file  Social History Narrative  . Not on file   Social Determinants of Health      Financial Resource Strain:   . Difficulty of Paying Living Expenses:   Food Insecurity:   . Worried About Charity fundraiser in the Last Year:   . Arboriculturist in the Last Year:   Transportation Needs:   . Film/video editor (Medical):   [name] Lack of Transportation (Non-Medical):        Allergies  Allergen Reactions  . Levofloxacin Swelling    Tongue and mouth swelling  . Amoxicillin Other (See Comments)  . Calcium Channel Blocking Agent Diltiazem Analogues Dizziness    Other reaction(s): Dizziness  . Nickel Rash  . Penicillins Other (See Comments)    Gets yeast infection Has patient had a PCN reaction causing immediate rash, facial/tongue/throat swelling, SOB or lightheadedness with hypotension: No Has patient had a PCN reaction causing severe rash involving mucus membranes or skin necrosis: No Has patient had a PCN reaction that required hospitalization:  No Has patient had a PCN reaction occurring within the last 10 years: No If all of the above answers are "NO", then may proceed with Cephalosporin use. Has patient had a PCN reaction causing immediate rash, facial/tongue/throat swelling, SOB or lightheadedness with hypotension: No Has patient had a PCN reaction causing severe rash involving mucus membranes or skin necrosis: No Has patient had a PCN reaction that required hospitalization: No Has patient had a PCN reaction occurring within the last 10 years: No If all of the above answers are "NO", then may proceed with Cephalosporin use.     Current Medications        Current Outpatient Medications  Medication Sig Dispense Refill  . albuterol 90 mcg/actuation inhaler TAKE 2 PUFFS BY MOUTH EVERY 6 HOURS AS NEEDED FOR WHEEZE OR SHORTNESS OF BREATH    . ALPRAZolam (XANAX) 0.5 MG tablet 1 tablet BID    . aspirin 81 MG EC tablet Take 81 mg by mouth once daily    . citalopram (CELEXA) 20 MG tablet Take 20 mg by mouth once daily      . fluticasone propionate (FLONASE) 50 mcg/actuation nasal spray Place 2 sprays into both nostrils once daily as needed       . omeprazole (PRILOSEC) 20 MG DR capsule Take 20 mg by mouth as needed       No current facility-administered medications for this visit.           Family History  Problem Relation Age of Onset  . Coronary Artery Disease (Blocked arteries around heart) Mother   . Diabetes Mother   . High blood pressure (Hypertension) Mother   . Mental illness Mother   . Lung cancer Father   . Drug abuse Brother   . Multiple sclerosis Brother   . Colon cancer Maternal Grandmother        age 44's         Review of Systems  Constitutional: Negative for chills and fever.  Respiratory: Negative for cough.   Gastrointestinal: Negative for blood in stool, constipation, diarrhea, nausea and vomiting.        Chief Complaint  Patient presents with  . Follow-up     hospital      BP 150/86   Pulse 70   Temp  36.4 C (97.6 F)   Resp 12   Ht 160 cm (5\' 3" )   Wt 76.7 kg (169 lb)   BMI 29.94 kg/m       Past Medical History:  Diagnosis Date  . Anxiety   . Arthritis   . Complication of anesthesia   . GERD (gastroesophageal reflux disease)   . Hyperlipidemia   . Hypertension   . Lower back pain    s/p lifting injury  . Neuromuscular disorder (CMS-HCC)   . Partial bowel obstruction (CMS-HCC) 02/04/2018  . Peripheral vascular disease (CMS-HCC)   . Stroke (CMS-HCC)   . Vertigo   . Vitamin D deficiency   . Wears contact lenses           Past Surgical History:  Procedure Laterality Date  . ABDOMINAL HYSTERECTOMY  1987  . cardiac cath  02/2007  . colon surgery  03/12/2012   Dr. Phylis Bougie  . COLONOSCOPY  09/26/2014   with polypectomy  . EGD  02/02/2016   with dilation  . LAPAROSCOPIC TUBAL LIGATION  1972              OB History    Gravida  3   Para  3   Term      Preterm      AB      Living        SAB      TAB      Ectopic      Molar      Multiple      Live Births          Obstetric Comments  Age at first period 36 Age of first pregnancy 15         Social History          Socioeconomic History  . Marital status: Married    Spouse name: Not on file  . Number of children: Not on file  . Years of education: Not on file  . Highest education level: Not on file  Occupational History  . Not on file  Tobacco Use  . Smoking status: Never Smoker  . Smokeless tobacco: Never Used  Substance and Sexual Activity  . Alcohol use: Never  . Drug use: Never  . Sexual activity: Not on file  Other Topics Concern  . Not on file  Social History Narrative  . Not on file   Social Determinants of Health      Financial Resource Strain:   . Difficulty of Paying Living Expenses:   Food Insecurity:   . Worried About Charity fundraiser in the Last Year:   . Youth worker in the Last Year:   Transportation Needs:   . Film/video editor (Medical):   [name] Lack of Transportation (Non-Medical):             Allergies  Allergen Reactions  . Levofloxacin Swelling    Tongue and mouth swelling  . Amoxicillin Other (See Comments)  . Calcium Channel Blocking Agent Diltiazem Analogues Dizziness    Other reaction(s): Dizziness  . Nickel Rash  . Penicillins Other (See Comments)    Gets yeast infection Has patient had a PCN reaction causing immediate rash, facial/tongue/throat swelling, SOB or lightheadedness with hypotension: No Has patient had a PCN reaction causing severe rash involving mucus membranes or skin necrosis: No Has patient had a PCN reaction that required hospitalization: No Has patient had a PCN reaction occurring within the last 10 years:  No If all of the above answers are "NO", then may proceed with Cephalosporin use. Has patient had a PCN reaction causing immediate rash, facial/tongue/throat swelling, SOB or lightheadedness with hypotension: No Has patient had a PCN reaction causing severe rash involving mucus membranes or skin necrosis: No Has patient had a PCN reaction that required hospitalization: No Has patient had a PCN reaction occurring within the last 10 years: No If all of the above answers are "NO", then may proceed with Cephalosporin use.     Current Medications        Current Outpatient Medications  Medication Sig Dispense Refill  . albuterol 90 mcg/actuation inhaler TAKE 2 PUFFS BY MOUTH EVERY 6 HOURS AS NEEDED FOR WHEEZE OR SHORTNESS OF BREATH    . ALPRAZolam (XANAX) 0.5 MG tablet 1 tablet BID    . aspirin 81 MG EC tablet Take 81 mg by mouth once daily    . citalopram (CELEXA) 20 MG tablet Take 20 mg by mouth once daily      . fluticasone propionate (FLONASE) 50 mcg/actuation nasal spray Place 2 sprays into both nostrils once daily as needed       . omeprazole (PRILOSEC) 20 MG DR capsule Take 20  mg by mouth as needed       No current facility-administered medications for this visit.           Family History  Problem Relation Age of Onset  . Coronary Artery Disease (Blocked arteries around heart) Mother   . Diabetes Mother   . High blood pressure (Hypertension) Mother   . Mental illness Mother   . Lung cancer Father   . Drug abuse Brother   . Multiple sclerosis Brother   . Colon cancer Maternal Grandmother        age 53's       Objective:   Physical Exam Constitutional:      Appearance: Normal appearance.  Cardiovascular:     Rate and Rhythm: Normal rate and regular rhythm.     Pulses: Normal pulses.          Femoral pulses are 2+ on the right side and 2+ on the left side.    Heart sounds: Normal heart sounds.  Pulmonary:     Effort: Pulmonary effort is normal.     Breath sounds: Normal breath sounds.  Abdominal:     General: Abdomen is flat. Bowel sounds are normal.     Palpations: Abdomen is soft.     Tenderness: There is no abdominal tenderness.     Hernia: A hernia is present.    Musculoskeletal:     Cervical back: Neck supple.  Lymphadenopathy:     Lower Body: No right inguinal adenopathy. No left inguinal adenopathy.  Skin:    General: Skin is warm and dry.  Neurological:     Mental Status: She is alert and oriented to person, place, and time.  Psychiatric:        Mood and Affect: Mood normal.        Behavior: Behavior normal.    Labs and Radiology:   Pathology from the 2014 colon resection showed a 6 mm tubular adenoma.    During her hospitalization CBC showed a hemoglobin of 15.5, MCV of 91.6.  White blood cell count of 9500.  Platelet count 268,000.  Comprehensive metabolic panel was notable for a modest elevation of blood sugar 145.  Normal electrolytes.  Normal renal function.  The patient's colonoscopy of September 26, 2014 completed  by Aundria Rud, MD was reviewed.  He describes a 20 mm polyp at the anastomosis.   Partial resection.  Multiple sigmoid diverticuli.  Pathology of the area describes granulation tissue only.  A 3-year follow-up was recommended.  Colon, polyp(s), transverse - POLYPOID, INFLAMED GRANULATION TISSUE. - NO ADENOMATOUS CHANGE OR MALIGNANCY IDENTIFIED.  CT scans of the abdomen and pelvis of August 2021 and October 2020 have been independently reviewed.  Pictures consistent with partial small bowel obstruction with transition points from the mid abdomen to the pelvis on various studies.    Assessment:     Previous resection of the transverse colon for a small adenomatous polyp, 2016 colonoscopy showing a 20 mm area of inflamed granulation tissue.    Plan:     The patient has had multiple episodes of partial obstruction which have all resolved, but are recurring about every 9 months.  With her past history of colon polyps, and the results of the 2016 exam, I think she would be well served by repeat colonoscopy to by the GI service or myself.  I discussed in detail a suggestion for diagnostic laparoscopy with the possibility of identifying the source of obstruction when she is asymptomatic and the possibility of a formal laparotomy at that time if laparoscopy is inconclusive.  She is weathered each of these episodes of obstruction without need for acute intervention, but she is very symptomatic for the first 3 or 4 days, especially when she declines NG tube placement and has multiple episodes of vomiting.  Risks of surgical intervention include anesthesia, bleeding, intestinal injury, potential need for formal laparotomy.  Clinical exam today does suggest the possibility of a small defect at the superior edge of her midline incision.  The results of the 2016 colonoscopy were not available when the patient was in the office, and she will be contacted with recommendations for a colonoscopy prior to laparoscopy/laparotomy.  Over 1 hour was spent during today's visit with record review  and discussion of surgical options.    Entered by Ledell Noss, CMA, acting as a scribe for Dr. Hervey Ard, MD.  Entered by Karie Fetch, RN, acting as a scribe for Dr. Hervey Ard, MD.   The documentation recorded by the scribe accurately reflects the service I personally performed and the decisions made by me.   Robert Bellow, MD FACS

## 2019-10-19 ENCOUNTER — Ambulatory Visit: Payer: Self-pay | Admitting: *Deleted

## 2019-10-19 DIAGNOSIS — F419 Anxiety disorder, unspecified: Secondary | ICD-10-CM

## 2019-10-19 DIAGNOSIS — F329 Major depressive disorder, single episode, unspecified: Secondary | ICD-10-CM

## 2019-10-19 NOTE — Chronic Care Management (AMB) (Signed)
  Chronic Care Management    Clinical Social Work Follow Up Note  10/19/2019 Name: [name] MRN: [id number] DOB: Feb 26, 1945  [name] is a 74 y.o. year old female who is a primary care patient of [name], [name], MD. The CCM team was consulted for assistance with Mental Health Counseling and Resources.   Review of patient status, including review of consultants reports, other relevant assessments, and collaboration with appropriate care team members and the patient's provider was performed as part of comprehensive patient evaluation and provision of chronic care management services.    SDOH (Social Determinants of Health) assessments performed: No    Outpatient Encounter Medications as of 10/19/2019  Medication Sig Note  . albuterol (VENTOLIN HFA) 108 (90 Base) MCG/ACT inhaler TAKE 2 PUFFS BY MOUTH EVERY 6 HOURS AS NEEDED FOR WHEEZE OR SHORTNESS OF BREATH   . ALPRAZolam (XANAX) 0.5 MG tablet TAKE 1 TABLET (0.5 MG TOTAL) BY MOUTH 3 (THREE) TIMES DAILY AS NEEDED FOR ANXIETY OR SLEEP.   [name] aspirin EC 81 MG tablet Take 81 mg by mouth daily.   . citalopram (CELEXA) 10 MG tablet TAKE 1 TABLET BY MOUTH EVERY DAY   . fluticasone (FLONASE) 50 MCG/ACT nasal spray PLACE 1-2 SPRAYS INTO BOTH NOSTRILS DAILY AS NEEDED FOR ALLERGIES OR RHINITIS.   [name] ibuprofen (ADVIL) 800 MG tablet TAKE 1 TABLET BY MOUTH DAILY AS NEEDED (TRY TO MINIMIZE USE TO SEVERE PAIN)   . omeprazole (PRILOSEC) 20 MG capsule Take 20 mg by mouth daily as needed (acid reflux).  (Patient not taking: Reported on 09/30/2019)   . venlafaxine XR (EFFEXOR-XR) 37.5 MG 24 hr capsule Take 37.5 mg by mouth daily. (Patient not taking: Reported on 09/21/2019) 09/21/2019: Last fill was 05/13/19 for 90 day supply   No facility-administered encounter medications on file as of 10/19/2019.     Goals Addressed              This Visit's Progress   .  "I would like to manage my depression better" (pt-stated)        Current Barriers:  [name] Mental  Health Concerns   Clinical Social Work Clinical Goal(s):  [name] Over the next 90 days, client will work with SW to address concerns related to depression  Interventions: . Followed up with patient in regards to her management of stress and depression . Patient discussed an improvement of mood, currently getting ready for a couple of procedures next month  . Continued to emphasize positive coping strategies-including separating her self from situations that may cause increased stress and managing her expectations . Continued to provided patient with positive reinforcement and emotional support  . Patient discussed desire to stop taking xanax-encouraged patient to discuss this with her provider . Discussed follow up with a psychiatrist . Discussed plans with patient for ongoing care management follow up and provided patient with direct contact information for care management team  Patient Self Care Activities:  . Performs ADL's independently . Performs IADL's independently . Calls provider office for new concerns or questions  Please see past updates related to this goal by clicking on the "Past Updates" button in the selected goal          Follow Up Plan: SW will follow up with patient by phone over the next 2-3 weeks    [place], [place] Worker  [facility] Care Management [phone]

## 2019-10-19 NOTE — Patient Instructions (Signed)
Thank you allowing the Chronic Care Management Team to be a part of your care! It was a pleasure speaking with you today!  1. Please call this social worker with any questions or concerns regarding your mental health needs  CCM (Chronic Care Management) Team   Morene Rankins RN, BSN Nurse Care Coordinator  [phone]  [place], LCSW Clinical Social Worker [phone]  Goals Addressed              This Visit's Progress   .  "I would like to manage my depression better" (pt-stated)        Current Barriers:  [name] Mental Health Concerns   Clinical Social Work Clinical Goal(s):  [name] Over the next 90 days, client will work with SW to address concerns related to depression  Interventions: . Followed up with patient in regards to her management of stress and depression . Patient discussed an improvement of mood, currently getting ready for a couple of procedures next month  . Continued to emphasize positive coping strategies-including separating her self from situations that may cause increased stress and managing her expectations . Continued to provided patient with positive reinforcement and emotional support  . Patient discussed desire to stop taking xanax-encouraged patient to discuss this with er provider . Discussed plans with patient for ongoing care management follow up and provided patient with direct contact information for care management team  Patient Self Care Activities:  . Performs ADL's independently . Performs IADL's independently . Calls provider office for new concerns or questions  Please see past updates related to this goal by clicking on the "Past Updates" button in the selected goal          The patient verbalized understanding of instructions provided today and declined a print copy of patient instruction materials.   Telephone follow up appointment with care management team member scheduled for:  11/02/19

## 2019-10-26 ENCOUNTER — Other Ambulatory Visit: Payer: Self-pay

## 2019-10-26 ENCOUNTER — Other Ambulatory Visit
Admission: RE | Admit: 2019-10-26 | Discharge: 2019-10-26 | Disposition: A | Discharge status: Home / Self Care | Payer: Medicare Other | Source: Ambulatory Visit | Attending: General Surgery | Admitting: General Surgery

## 2019-10-26 DIAGNOSIS — Z20822 Contact with and (suspected) exposure to covid-19: Secondary | ICD-10-CM | POA: Diagnosis not present

## 2019-10-26 DIAGNOSIS — Z01812 Encounter for preprocedural laboratory examination: Secondary | ICD-10-CM | POA: Insufficient documentation

## 2019-10-27 ENCOUNTER — Other Ambulatory Visit: Payer: Self-pay

## 2019-10-27 ENCOUNTER — Ambulatory Visit
Admission: RE | Admit: 2019-10-27 | Discharge: 2019-10-27 | Disposition: A | Discharge status: Home / Self Care | Payer: Medicare Other | Attending: General Surgery | Admitting: General Surgery

## 2019-10-27 ENCOUNTER — Ambulatory Visit: Payer: Medicare Other | Admitting: Certified Registered"

## 2019-10-27 ENCOUNTER — Encounter: Payer: Self-pay | Admitting: General Surgery

## 2019-10-27 ENCOUNTER — Encounter
Admission: RE | Disposition: A | Discharge status: Home / Self Care | Payer: Self-pay | Source: Home / Self Care | Attending: General Surgery

## 2019-10-27 DIAGNOSIS — M1990 Unspecified osteoarthritis, unspecified site: Secondary | ICD-10-CM | POA: Insufficient documentation

## 2019-10-27 DIAGNOSIS — Z86010 Personal history of colonic polyps: Secondary | ICD-10-CM | POA: Insufficient documentation

## 2019-10-27 DIAGNOSIS — Z8673 Personal history of transient ischemic attack (TIA), and cerebral infarction without residual deficits: Secondary | ICD-10-CM | POA: Diagnosis not present

## 2019-10-27 DIAGNOSIS — Z7982 Long term (current) use of aspirin: Secondary | ICD-10-CM | POA: Insufficient documentation

## 2019-10-27 DIAGNOSIS — Z1211 Encounter for screening for malignant neoplasm of colon: Secondary | ICD-10-CM | POA: Diagnosis not present

## 2019-10-27 DIAGNOSIS — K5730 Diverticulosis of large intestine without perforation or abscess without bleeding: Secondary | ICD-10-CM | POA: Diagnosis not present

## 2019-10-27 DIAGNOSIS — Z881 Allergy status to other antibiotic agents status: Secondary | ICD-10-CM | POA: Insufficient documentation

## 2019-10-27 DIAGNOSIS — Z888 Allergy status to other drugs, medicaments and biological substances status: Secondary | ICD-10-CM | POA: Insufficient documentation

## 2019-10-27 DIAGNOSIS — Z880 Allergy status to penicillin: Secondary | ICD-10-CM | POA: Diagnosis not present

## 2019-10-27 DIAGNOSIS — F329 Major depressive disorder, single episode, unspecified: Secondary | ICD-10-CM | POA: Insufficient documentation

## 2019-10-27 DIAGNOSIS — K219 Gastro-esophageal reflux disease without esophagitis: Secondary | ICD-10-CM | POA: Insufficient documentation

## 2019-10-27 DIAGNOSIS — I10 Essential (primary) hypertension: Secondary | ICD-10-CM | POA: Insufficient documentation

## 2019-10-27 DIAGNOSIS — F419 Anxiety disorder, unspecified: Secondary | ICD-10-CM | POA: Insufficient documentation

## 2019-10-27 DIAGNOSIS — I252 Old myocardial infarction: Secondary | ICD-10-CM | POA: Diagnosis not present

## 2019-10-27 DIAGNOSIS — Z79899 Other long term (current) drug therapy: Secondary | ICD-10-CM | POA: Insufficient documentation

## 2019-10-27 DIAGNOSIS — Z9049 Acquired absence of other specified parts of digestive tract: Secondary | ICD-10-CM | POA: Diagnosis not present

## 2019-10-27 DIAGNOSIS — E785 Hyperlipidemia, unspecified: Secondary | ICD-10-CM | POA: Diagnosis not present

## 2019-10-27 DIAGNOSIS — K5790 Diverticulosis of intestine, part unspecified, without perforation or abscess without bleeding: Secondary | ICD-10-CM | POA: Diagnosis not present

## 2019-10-27 HISTORY — PX: COLONOSCOPY WITH PROPOFOL: SHX5780

## 2019-10-27 LAB — SARS CORONAVIRUS 2 (TAT 6-24 HRS): SARS Coronavirus 2: NEGATIVE

## 2019-10-27 SURGERY — COLONOSCOPY WITH PROPOFOL
Anesthesia: General

## 2019-10-27 MED ORDER — PROPOFOL 10 MG/ML IV BOLUS
INTRAVENOUS | Status: DC | PRN
  Administered 2019-10-27: 50 mg via INTRAVENOUS

## 2019-10-27 MED ORDER — PROPOFOL 500 MG/50ML IV EMUL
INTRAVENOUS | Status: AC
  Filled 2019-10-27: qty 50

## 2019-10-27 MED ORDER — SODIUM CHLORIDE 0.9 % IV SOLN: INTRAVENOUS | Status: DC

## 2019-10-27 MED ORDER — LIDOCAINE HCL (CARDIAC) PF 100 MG/5ML IV SOSY
PREFILLED_SYRINGE | INTRAVENOUS | Status: DC | PRN
  Administered 2019-10-27: 50 mg via INTRAVENOUS

## 2019-10-27 MED ORDER — PROPOFOL 500 MG/50ML IV EMUL
INTRAVENOUS | Status: DC | PRN
  Administered 2019-10-27: 125 ug/kg/min via INTRAVENOUS

## 2019-10-27 NOTE — Op Note (Signed)
[facility] Gastroenterology Patient Name: [name] Procedure Date: 10/27/2019 9:38 AM MRN: [id number] Account #: [id number] Date of Birth: 1945-08-10 Admit Type: Outpatient Age: 74 Room: [facility] ENDO ROOM 1 Gender: Female Note Status: Finalized Procedure:             Colonoscopy Indications:           High risk colon cancer surveillance: Personal history                         of colonic polyps Providers:             Robert Bellow, MD Referring MD:          [name]. [name], MD (Referring MD) Medicines:             Monitored Anesthesia Care Complications:         No immediate complications. Procedure:             Pre-Anesthesia Assessment:                        - Prior to the procedure, a History and Physical was                         performed, and patient medications, allergies and                         sensitivities were reviewed. The patient's tolerance                         of previous anesthesia was reviewed.                        - The risks and benefits of the procedure and the                         sedation options and risks were discussed with the                         patient. All questions were answered and informed                         consent was obtained.                        After obtaining informed consent, the colonoscope was                         passed under direct vision. Throughout the procedure,                         the patient's blood pressure, pulse, and oxygen                         saturations were monitored continuously. The                         Colonoscope was introduced through the anus and                         advanced to the the terminal  ileum. The colonoscopy                         was performed without difficulty. The patient                         tolerated the procedure well. The quality of the bowel                         preparation was excellent. Findings:      A few small-mouthed  diverticula were found in the descending colon.      The retroflexed view of the distal rectum and anal verge was normal and       showed no anal or rectal abnormalities. Impression:            - Diverticulosis in the descending colon.                        - The distal rectum and anal verge are normal on                         retroflexion view.                        - No specimens collected. Recommendation:        - Repeat colonoscopy in 5 years for surveillance. Procedure Code(s):     --- Professional ---                        [phone], Colonoscopy, flexible; diagnostic, including                         collection of specimen(s) by brushing or washing, when                         performed (separate procedure) Diagnosis Code(s):     --- Professional ---                        K57.30, Diverticulosis of large intestine without                         perforation or abscess without bleeding                        Z86.010, Personal history of colonic polyps CPT copyright 2019 American Medical Association. All rights reserved. The codes documented in this report are preliminary and upon coder review may  be revised to meet current compliance requirements. Robert Bellow, MD 10/27/2019 10:07:06 AM This report has been signed electronically. Number of Addenda: 0 Note Initiated On: 10/27/2019 9:38 AM Scope Withdrawal Time: 0 hours 10 minutes 9 seconds  Total Procedure Duration: 0 hours 16 minutes 16 seconds  Estimated Blood Loss:  Estimated blood loss: none.      [facility]

## 2019-10-27 NOTE — Anesthesia Postprocedure Evaluation (Signed)
Anesthesia Post Note  Patient: [name]  Procedure(s) Performed: COLONOSCOPY WITH PROPOFOL (N/A )  Patient location during evaluation: Endoscopy Anesthesia Type: General Level of consciousness: awake and alert Pain management: pain level controlled Vital Signs Assessment: post-procedure vital signs reviewed and stable Respiratory status: spontaneous breathing and respiratory function stable Cardiovascular status: stable Anesthetic complications: no   No complications documented.   Last Vitals:  Vitals:   10/27/19 1029 10/27/19 1039  BP: 138/82   Pulse: 73 72  Resp: 12 12  Temp:    SpO2: 100% 100%    Last Pain:  Vitals:   10/27/19 1039  TempSrc:   PainSc: 0-No pain                 Lesta Limbert K

## 2019-10-27 NOTE — H&P (Signed)
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  Prior colonoscopy showed a large suspected polyp which on biopsy showed inflammatory tissue.  She tolerated the prep well.    Medications Prior to Admission  Medication Sig Dispense Refill Last Dose  . ALPRAZolam (XANAX) 0.5 MG tablet TAKE 1 TABLET (0.5 MG TOTAL) BY MOUTH 3 (THREE) TIMES DAILY AS NEEDED FOR ANXIETY OR SLEEP. (Patient taking differently: Take 0.5 mg by mouth 2 (two) times daily as needed for anxiety or sleep. ) 90 tablet 3 10/26/2019 at Unknown time  . aspirin EC 81 MG tablet Take 81 mg by mouth daily.   10/26/2019 at Unknown time  . albuterol (VENTOLIN HFA) 108 (90 Base) MCG/ACT inhaler TAKE 2 PUFFS BY MOUTH EVERY 6 HOURS AS NEEDED FOR WHEEZE OR SHORTNESS OF BREATH (Patient taking differently: Inhale 2 puffs into the lungs every 6 (six) hours as needed for wheezing or shortness of breath. ) 8.5 g 2   . citalopram (CELEXA) 10 MG tablet TAKE 1 TABLET BY MOUTH EVERY DAY (Patient not taking: TAKE 1 TABLET BY MOUTH EVERY DAY) 90 tablet 2 Not Taking  . fluticasone (FLONASE) 50 MCG/ACT nasal spray PLACE 1-2 SPRAYS INTO BOTH NOSTRILS DAILY AS NEEDED FOR ALLERGIES OR RHINITIS. 48 mL 1   . ibuprofen (ADVIL) 800 MG tablet TAKE 1 TABLET BY MOUTH DAILY AS NEEDED (TRY TO MINIMIZE USE TO SEVERE PAIN) (Patient taking differently: Take 800 mg by mouth daily as needed (severe pain). ) 30 tablet 0   . omeprazole (PRILOSEC) 20 MG capsule Take 20 mg by mouth daily as needed (acid reflux).      . venlafaxine XR (EFFEXOR-XR) 37.5 MG 24 hr capsule Take 37.5 mg by mouth daily. (Patient not taking: Reported on 10/20/2019)      Allergies  Allergen Reactions  . Levofloxacin     Tongue and mouth swelling  . Calcium Channel Blockers     Other reaction(s): Dizziness  . Amoxicillin Other (See Comments)    Causes yeast infection Has patient had a PCN reaction causing  immediate rash, facial/tongue/throat swelling, SOB or lightheadedness with hypotension: No Has patient had a PCN reaction causing severe rash involving mucus membranes or skin necrosis: No Has patient had a PCN reaction that required hospitalization: No Has patient had a PCN reaction occurring within the last 10 years: No If all of the above answers are "NO", then may proceed with Cephalosporin use.   . Nickel Rash  . Penicillins Other (See Comments)    Causes yeast infection Has patient had a PCN reaction causing immediate rash, facial/tongue/throat swelling, SOB or lightheadedness with hypotension: No Has patient had a PCN reaction causing severe rash involving mucus membranes or skin necrosis: No Has patient had a PCN reaction that required hospitalization: No Has patient had a PCN reaction occurring within the last 10 years: No If all of the above answers are "NO", then may proceed with Cephalosporin use.   Past Medical History:  Diagnosis Date  . Anxiety   . Arthritis    lower spine  . Back pain    lower back - S/P lifting injury  . Complication of anesthesia    makes hair brittle  . GERD (gastroesophageal reflux disease)   . Hyperlipidemia   . Hypertension   . Neuromuscular disorder (HCC)    numbness legs and feet s/p lower back injury  . Partial bowel  obstruction ([place]) 02/04/2018  . Stroke [facility])    "mini - strokes" 2009 - no deficit  . Vertigo    none recently  . Vitamin D deficiency   . Wears contact lenses    Past Surgical History:  Procedure Laterality Date  . ABDOMINAL HYSTERECTOMY  1987  . CARDIAC CATHETERIZATION  02/2007  . COLON SURGERY  03/12/2012   Dr Phylis Bougie  . COLONOSCOPY WITH PROPOFOL N/A 09/26/2014   Procedure: COLONOSCOPY WITH PROPOFOL;  Surgeon: Lucilla Lame, MD;  Location: [place];  Service: Endoscopy;  Laterality: N/A;  marker (tattoo) used in colon  . ESOPHAGEAL DILATION N/A 02/02/2016   Procedure: ESOPHAGEAL DILATION;  Surgeon: Lucilla Lame, MD;  Location: [place];  Service: Endoscopy;  Laterality: N/A;  . ESOPHAGOGASTRODUODENOSCOPY (EGD) WITH PROPOFOL N/A 02/02/2016   Procedure: ESOPHAGOGASTRODUODENOSCOPY (EGD) WITH PROPOFOL;  Surgeon: Lucilla Lame, MD;  Location: [place];  Service: Endoscopy;  Laterality: N/A;  . POLYPECTOMY  09/26/2014   Procedure: POLYPECTOMY;  Surgeon: Lucilla Lame, MD;  Location: [place];  Service: Endoscopy;;  . TUBAL LIGATION  1972   Social History   Socioeconomic History  . Marital status: Married    Spouse name: Not on file  . Number of children: 3  . Years of education: Not on file  . Highest education level: 12th grade  Occupational History  . Occupation: retired  Tobacco Use  . Smoking status: Never Smoker  . Smokeless tobacco: Never Used  Vaping Use  . Vaping Use: Never used  Substance and Sexual Activity  . Alcohol use: No    Alcohol/week: 0.0 standard drinks  . Drug use: No  . Sexual activity: Not Currently  Other Topics Concern  . Not on file  Social History Narrative  . Not on file   Social Determinants of Health   Financial Resource Strain: Low Risk   . Difficulty of Paying Living Expenses: Not hard at all  Food Insecurity: No Food Insecurity  . Worried About Charity fundraiser in the Last Year: Never true  . Ran Out of Food in the Last Year: Never true  Transportation Needs: No Transportation Needs  . Lack of Transportation (Medical): No  . Lack of Transportation (Non-Medical): No  Physical Activity: Inactive  . Days of Exercise per Week: 0 days  . Minutes of Exercise per Session: 0 min  Stress: No Stress Concern Present  . Feeling of Stress : Only a little  Social Connections: Moderately Integrated  . Frequency of Communication with Friends and Family: More than three times a week  . Frequency of Social Gatherings with Friends and Family: More than three times a week  . Attends Religious Services: More than 4 times per year  .  Active Member of Clubs or Organizations: No  . Attends Archivist Meetings: Never  . Marital Status: Married  Human resources officer Violence: Not At Risk  . Fear of Current or Ex-Partner: No  . Emotionally Abused: No  . Physically Abused: No  . Sexually Abused: No   Social History   Social History Narrative  . Not on file     ROS: Negative.     PE: HEENT: Negative. Lungs: Clear. Cardio: RR.   Assessment/Plan:  Proceed with planned endoscopy.   Forest Gleason [facility] 10/27/2019

## 2019-10-27 NOTE — Anesthesia Procedure Notes (Signed)
Date/Time: 10/27/2019 9:46 AM Performed by: Jerrye Noble, CRNA Pre-anesthesia Checklist: Patient identified, Emergency Drugs available, Suction available and Patient being monitored Patient Re-evaluated:Patient Re-evaluated prior to induction Oxygen Delivery Method: Nasal cannula

## 2019-10-27 NOTE — Transfer of Care (Signed)
Immediate Anesthesia Transfer of Care Note  Patient: [name]  Procedure(s) Performed: COLONOSCOPY WITH PROPOFOL (N/A )  Patient Location: PACU and Endoscopy Unit  Anesthesia Type:General  Level of Consciousness: drowsy  Airway & Oxygen Therapy: Patient Spontanous Breathing  Post-op Assessment: Report given to RN and Post -op Vital signs reviewed and stable  Post vital signs: Reviewed and stable  Last Vitals:  Vitals Value Taken Time  BP    Temp    Pulse 78 10/27/19 1009  Resp 14 10/27/19 1009  SpO2 95 % 10/27/19 1009  Vitals shown include unvalidated device data.  Last Pain:  Vitals:   10/27/19 0927  TempSrc: Temporal  PainSc: 0-No pain         Complications: No complications documented.

## 2019-10-27 NOTE — Anesthesia Preprocedure Evaluation (Signed)
Anesthesia Evaluation  Patient identified by MRN, date of birth, ID band Patient awake    Reviewed: Allergy & Precautions, NPO status , Patient's Chart, lab work & pertinent test results  History of Anesthesia Complications Negative for: history of anesthetic complications  Airway Mallampati: II       Dental   Pulmonary neg sleep apnea, neg COPD, Not current smoker,           Cardiovascular hypertension, Pt. 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)  (-) CHF (-) dysrhythmias (-) Valvular Problems/Murmurs     Neuro/Psych neg Seizures Anxiety Depression CVA (pt unaware of any effects), No Residual Symptoms    GI/Hepatic Neg liver ROS, GERD  Medicated and Controlled,  Endo/Other  neg diabetes  Renal/GU negative Renal ROS     Musculoskeletal   Abdominal   Peds  Hematology   Anesthesia Other Findings   Reproductive/Obstetrics                             Anesthesia Physical Anesthesia Plan  ASA: III  Anesthesia Plan: General   Post-op Pain Management:    Induction: Intravenous  PONV Risk Score and Plan: 3 and Propofol infusion, TIVA and Treatment may vary due to age or medical condition  Airway Management Planned: Nasal Cannula  Additional Equipment:   Intra-op Plan:   Post-operative Plan:   Informed Consent: I have reviewed the patients History and Physical, chart, labs and discussed the procedure including the risks, benefits and alternatives for the proposed anesthesia with the patient or authorized representative who has indicated his/her understanding and acceptance.       Plan Discussed with:   Anesthesia Plan Comments:         Anesthesia Quick Evaluation

## 2019-10-28 ENCOUNTER — Encounter: Payer: Self-pay | Admitting: General Surgery

## 2019-10-29 ENCOUNTER — Encounter
Admission: RE | Admit: 2019-10-29 | Discharge: 2019-10-29 | Disposition: A | Discharge status: Home / Self Care | Payer: Medicare Other | Source: Ambulatory Visit | Attending: General Surgery | Admitting: General Surgery

## 2019-10-29 ENCOUNTER — Other Ambulatory Visit: Payer: Self-pay

## 2019-10-29 NOTE — Patient Instructions (Signed)
Your procedure is scheduled on: Monday November 08, 2019. Report to Day Surgery inside [place] 2nd floor. To find out your arrival time please call [phone] between 1PM - 3PM on Friday November 05, 2019.  Remember: Instructions that are not followed completely may result in serious medical risk,  up to and including death, or upon the discretion of your surgeon and anesthesiologist your  surgery may need to be rescheduled.     _X__ 1. Do not eat food after midnight the night before your procedure.                 No chewing gum or hard candies. You may drink clear liquids up to 2 hours                 before you are scheduled to arrive for your surgery- DO not drink clear                 liquids within 2 hours of the start of your surgery.                 Clear Liquids include:  water, apple juice without pulp, clear Gatorade, G2 or                  Gatorade Zero (avoid Red/Purple/Blue), Black Coffee or Tea (Do not add                 anything to coffee or tea).  __X__2.  On the morning of surgery brush your teeth with toothpaste and water, you                may rinse your mouth with mouthwash if you wish.  Do not swallow any toothpaste of mouthwash.     _X__ 3.  No Alcohol for 24 hours before or after surgery.   _X__ 4.  Do Not Smoke or use e-cigarettes For 24 Hours Prior to Your Surgery.                 Do not use any chewable tobacco products for at least 6 hours prior to                 Surgery.  _X__  5.  Do not use any recreational drugs (marijuana, cocaine, heroin, ecstasy, MDMA or other)                For at least one week prior to your surgery.  Combination of these drugs with anesthesia                May have life threatening results..   __X__6.  Notify your doctor if there is any change in your medical condition      (cold, fever, infections).     Do not wear jewelry, make-up, hairpins, clips or nail polish. Do not wear lotions,  powders, or perfumes. You may wear deodorant. Do not shave 48 hours prior to surgery. Men may shave face and neck. Do not bring valuables to the hospital.    [facility] is not responsible for any belongings or valuables.  Contacts, dentures or bridgework may not be worn into surgery. Leave your suitcase in the car. After surgery it may be brought to your room. For patients admitted to the hospital, discharge time is determined by your treatment team.   Patients discharged the day of surgery will not be allowed to drive home.   Make arrangements for someone to be  with you for the first 24 hours of your Same Day Discharge.   __X__ Take these medicines the morning of surgery with A SIP OF WATER:    1. omeprazole (PRILOSEC) 20 MG  2. ALPRAZolam (XANAX) 0.5 MG   __x__ Use CHG Soap (or wipes) as directed  __X__ Use inhalers on the day of surgery  albuterol (VENTOLIN HFA) 108 (90 Base) MCG/ACT inhaler  __x__ Stop aspirin EC 81 MG   7 days before your surgery.  __x__ Stop Anti-inflammatories such as ibuprofen (ADVIL), Advil, Aleve, naproxen and or BC powders.    __x__ Stop supplements until after surgery.    __x__ Do not start any herbal supplements before your surgery.    If you have any questions regarding your pre-procedure instructions,  Please call Pre-admit Testing at [phone].

## 2019-11-02 ENCOUNTER — Ambulatory Visit: Payer: Self-pay | Admitting: *Deleted

## 2019-11-02 DIAGNOSIS — F329 Major depressive disorder, single episode, unspecified: Secondary | ICD-10-CM

## 2019-11-02 DIAGNOSIS — F419 Anxiety disorder, unspecified: Secondary | ICD-10-CM

## 2019-11-02 DIAGNOSIS — K660 Peritoneal adhesions (postprocedural) (postinfection): Secondary | ICD-10-CM | POA: Diagnosis not present

## 2019-11-02 NOTE — Patient Instructions (Signed)
Thank you allowing the Chronic Care Management Team to be a part of your care! It was a pleasure speaking with you today!  1. Please call this social worker with any questions or concerns regarding your mental health needs  CCM (Chronic Care Management) Team   Neldon Labella RN, BSN Nurse Care Coordinator  [phone]  [place], LCSW Clinical Social Worker [phone]  Goals Addressed              This Visit's Progress   .  "I would like to manage my depression better" (pt-stated)        Current Barriers:  [name] Mental Health Concerns   Clinical Social Work Clinical Goal(s):  [name] Over the next 90 days, client will work with SW to address concerns related to depression Interventions: . Followed up with patient in regards to her management of stress and depression . Patient discussed an improvement of mood, stating that her colonoscopy and endoscopy procedures went well, now preparing for her next procedure on 11/08/19 . Continued to provided patient with positive reinforcement and emotional support  . Will assist arranging follow up with a psychiatrist following her procedure on 11/08/19 . Discussed plans with patient for ongoing care management follow up and provided patient with direct contact information for care management team  Patient Self Care Activities:  . Performs ADL's independently . Performs IADL's independently . Calls provider office for new concerns or questions  Please see past updates related to this goal by clicking on the "Past Updates" button in the selected goal          The patient verbalized understanding of instructions provided today and declined a print copy of patient instruction materials.   Telephone follow up appointment with care management team member scheduled for:  11/16/19

## 2019-11-02 NOTE — Chronic Care Management (AMB) (Signed)
Chronic Care Management    Clinical Social Work Follow Up Note  11/02/2019 Name: [name] MRN: [id number] DOB: 1945-05-21  [name] is a 74 y.o. year old female who is a primary care patient of [name], [name], MD. The CCM team was consulted for assistance with Mental Health Counseling and Resources.   Review of patient status, including review of consultants reports, other relevant assessments, and collaboration with appropriate care team members and the patient's provider was performed as part of comprehensive patient evaluation and provision of chronic care management services.    SDOH (Social Determinants of Health) assessments performed: No    Outpatient Encounter Medications as of 11/02/2019  Medication Sig Note  . albuterol (VENTOLIN HFA) 108 (90 Base) MCG/ACT inhaler TAKE 2 PUFFS BY MOUTH EVERY 6 HOURS AS NEEDED FOR WHEEZE OR SHORTNESS OF BREATH (Patient taking differently: Inhale 2 puffs into the lungs every 6 (six) hours as needed for wheezing or shortness of breath. )   . ALPRAZolam (XANAX) 0.5 MG tablet TAKE 1 TABLET (0.5 MG TOTAL) BY MOUTH 3 (THREE) TIMES DAILY AS NEEDED FOR ANXIETY OR SLEEP. (Patient taking differently: Take 0.5 mg by mouth 2 (two) times daily as needed for anxiety or sleep. )   . aspirin EC 81 MG tablet Take 81 mg by mouth daily.   . citalopram (CELEXA) 10 MG tablet TAKE 1 TABLET BY MOUTH EVERY DAY (Patient not taking: TAKE 1 TABLET BY MOUTH EVERY DAY)   . fluticasone (FLONASE) 50 MCG/ACT nasal spray PLACE 1-2 SPRAYS INTO BOTH NOSTRILS DAILY AS NEEDED FOR ALLERGIES OR RHINITIS.   [name] ibuprofen (ADVIL) 800 MG tablet TAKE 1 TABLET BY MOUTH DAILY AS NEEDED (TRY TO MINIMIZE USE TO SEVERE PAIN) (Patient taking differently: Take 800 mg by mouth daily as needed (severe pain). )   . omeprazole (PRILOSEC) 20 MG capsule Take 20 mg by mouth daily as needed (acid reflux).    . venlafaxine XR (EFFEXOR-XR) 37.5 MG 24 hr capsule Take 37.5 mg by mouth daily. (Patient  not taking: Reported on 10/20/2019) 09/21/2019: Last fill was 05/13/19 for 90 day supply   No facility-administered encounter medications on file as of 11/02/2019.     Goals Addressed              This Visit's Progress   .  "I would like to manage my depression better" (pt-stated)        Current Barriers:  [name] Mental Health Concerns   Clinical Social Work Clinical Goal(s):  [name] Over the next 90 days, client will work with SW to address concerns related to depression Interventions: . Followed up with patient in regards to her management of stress and depression . Patient was not at home at the time of the call, phone call was brief . Patient discussed an improvement of mood, stating that her colonoscopy and endoscopy procedures went well, now preparing for her next procedure on 11/08/19 . Continued to provided patient with positive reinforcement and emotional support  . Will assist arranging follow up with a psychiatrist following her procedure on 11/08/19 . Discussed plans with patient for ongoing care management follow up and provided patient with direct contact information for care management team  Patient Self Care Activities:  . Performs ADL's independently . Performs IADL's independently . Calls provider office for new concerns or questions  Please see past updates related to this goal by clicking on the "Past Updates" button in the selected goal  Follow Up Plan: SW will follow up with patient by phone over the next 7-14 business days    [place], Julian Worker  [facility] Care Management [phone]

## 2019-11-03 NOTE — H&P (Signed)
Subjective:     Patient ID: [name] is a 74 y.o. female.  HPI  The following portions of the patient's history were reviewed and updated as appropriate.  This an established patient is here today for: office visit. Patient is here today for a pre-op visit. She is scheduled for a laparoscopy possible laparotomy on 11-08-19 at [facility]. She states she is doing well.      Chief Complaint  Patient presents with  . Pre-op Exam     BP 132/84   Pulse 81   Temp 36.9 C (98.4 F)   Ht 160 cm (5\' 3" )   Wt 78 kg (172 lb)   SpO2 96%   BMI 30.47 kg/m       Past Medical History:  Diagnosis Date  . Anxiety   . Arthritis   . Complication of anesthesia   . GERD (gastroesophageal reflux disease)   . Hyperlipidemia   . Hypertension   . Lower back pain    s/p lifting injury  . Neuromuscular disorder (CMS-HCC)   . Partial bowel obstruction (CMS-HCC) 02/04/2018  . Peripheral vascular disease (CMS-HCC)   . Stroke (CMS-HCC)   . Vertigo   . Vitamin D deficiency   . Wears contact lenses           Past Surgical History:  Procedure Laterality Date  . ABDOMINAL HYSTERECTOMY  1987  . cardiac cath  02/2007  . colon surgery  03/12/2012   Dr. Phylis Bougie  . COLONOSCOPY  09/26/2014, 10-27-19   with polypectomy  . COLONOSCOPY  10/27/2019   Diverticulosis/PHx CP/Repeat 73yrs/[facility]  . EGD  02/02/2016   with dilation  . LAPAROSCOPIC TUBAL LIGATION  1972              OB History    Gravida  3   Para  3   Term      Preterm      AB      Living        SAB      TAB      Ectopic      Molar      Multiple      Live Births          Obstetric Comments  Age at first period 47 Age of first pregnancy 10         Social History          Socioeconomic History  . Marital status: Married    Spouse name: Not on file  . Number of children: Not on file  . Years of education: Not on file  . Highest education level: Not on file   Occupational History  . Not on file  Tobacco Use  . Smoking status: Never Smoker  . Smokeless tobacco: Never Used  Substance and Sexual Activity  . Alcohol use: Never  . Drug use: Never  . Sexual activity: Not on file  Other Topics Concern  . Not on file  Social History Narrative  . Not on file   Social Determinants of Health      Financial Resource Strain:   . Difficulty of Paying Living Expenses:   Food Insecurity:   . Worried About Charity fundraiser in the Last Year:   . Arboriculturist in the Last Year:   Transportation Needs:   . Film/video editor (Medical):   [name] Lack of Transportation (Non-Medical):  Allergies  Allergen Reactions  . Levofloxacin Swelling    Tongue and mouth swelling  . Amoxicillin Other (See Comments)  . Calcium Channel Blocking Agent Diltiazem Analogues Dizziness    Other reaction(s): Dizziness  . Nickel Rash  . Penicillins Other (See Comments)    Gets yeast infection Has patient had a PCN reaction causing immediate rash, facial/tongue/throat swelling, SOB or lightheadedness with hypotension: No Has patient had a PCN reaction causing severe rash involving mucus membranes or skin necrosis: No Has patient had a PCN reaction that required hospitalization: No Has patient had a PCN reaction occurring within the last 10 years: No If all of the above answers are "NO", then may proceed with Cephalosporin use. Has patient had a PCN reaction causing immediate rash, facial/tongue/throat swelling, SOB or lightheadedness with hypotension: No Has patient had a PCN reaction causing severe rash involving mucus membranes or skin necrosis: No Has patient had a PCN reaction that required hospitalization: No Has patient had a PCN reaction occurring within the last 10 years: No If all of the above answers are "NO", then may proceed with Cephalosporin use.     Current Medications        Current Outpatient Medications  Medication  Sig Dispense Refill  . albuterol 90 mcg/actuation inhaler TAKE 2 PUFFS BY MOUTH EVERY 6 HOURS AS NEEDED FOR WHEEZE OR SHORTNESS OF BREATH    . ALPRAZolam (XANAX) 0.5 MG tablet 1 tablet BID    . aspirin 81 MG EC tablet Take 81 mg by mouth once daily    . citalopram (CELEXA) 20 MG tablet Take 20 mg by mouth once daily      . fluticasone propionate (FLONASE) 50 mcg/actuation nasal spray Place 2 sprays into both nostrils once daily as needed       . omeprazole (PRILOSEC) 20 MG DR capsule Take 20 mg by mouth as needed       No current facility-administered medications for this visit.           Family History  Problem Relation Age of Onset  . Coronary Artery Disease (Blocked arteries around heart) Mother   . Diabetes Mother   . High blood pressure (Hypertension) Mother   . Mental illness Mother   . Lung cancer Father   . Drug abuse Brother   . Multiple sclerosis Brother   . Colon cancer Maternal Grandmother        age 74's    Review of Systems  Constitutional: Negative for chills and fever.  Respiratory: Negative for cough.        Objective:   Physical Exam Exam conducted with a chaperone present.  Constitutional:      Appearance: Normal appearance.  Cardiovascular:     Rate and Rhythm: Normal rate and regular rhythm.     Pulses: Normal pulses.     Heart sounds: Normal heart sounds.  Pulmonary:     Effort: Pulmonary effort is normal.     Breath sounds: Normal breath sounds.  Musculoskeletal:     Cervical back: Neck supple.  Skin:    General: Skin is warm and dry.  Neurological:     Mental Status: She is alert and oriented to person, place, and time.  Psychiatric:        Mood and Affect: Mood normal.        Behavior: Behavior normal.    Labs and Radiology:   Colonoscopy completed October 27, 2019 was unremarkable.  No evidence of area of granulation tissue previously  identified at the time of her August 2016 exam completed by Lucilla Lame, MD.     Assessment:     Candidate for follow-up colonoscopy in 5 years.  Plans for diagnostic laparoscopy, possible laparotomy to relieve source of intermittent small bowel obstruction plan for November 08, 2019    Plan:     I hope is that we can identify the source of recurrent small bowel obstruction during laparoscopic exam.  The patient is aware that it may be necessary to do a formal open procedure.    Entered by Ledell Noss, CMA, acting as a scribe for Dr. Hervey Ard, MD.   The documentation recorded by the scribe accurately reflects the service I personally performed and the decisions made by me.   Robert Bellow, MD FACS

## 2019-11-04 ENCOUNTER — Other Ambulatory Visit: Payer: Self-pay

## 2019-11-04 ENCOUNTER — Other Ambulatory Visit
Admission: RE | Admit: 2019-11-04 | Discharge: 2019-11-04 | Disposition: A | Discharge status: Home / Self Care | Payer: Medicare Other | Source: Ambulatory Visit | Attending: General Surgery | Admitting: General Surgery

## 2019-11-04 DIAGNOSIS — Z20822 Contact with and (suspected) exposure to covid-19: Secondary | ICD-10-CM | POA: Diagnosis not present

## 2019-11-04 DIAGNOSIS — Z01812 Encounter for preprocedural laboratory examination: Secondary | ICD-10-CM | POA: Diagnosis not present

## 2019-11-04 LAB — SARS CORONAVIRUS 2 (TAT 6-24 HRS): SARS Coronavirus 2: NEGATIVE

## 2019-11-08 ENCOUNTER — Ambulatory Visit: Payer: Medicare Other | Admitting: Anesthesiology

## 2019-11-08 ENCOUNTER — Inpatient Hospital Stay
Admission: AD | Admit: 2019-11-08 | Discharge: 2019-11-10 | DRG: 329 | Disposition: A | Discharge status: Home / Self Care | Payer: Medicare Other | Attending: General Surgery | Admitting: General Surgery

## 2019-11-08 ENCOUNTER — Encounter
Admission: AD | Disposition: A | Discharge status: Home / Self Care | Payer: Self-pay | Source: Home / Self Care | Attending: General Surgery

## 2019-11-08 ENCOUNTER — Encounter: Payer: Self-pay | Admitting: General Surgery

## 2019-11-08 ENCOUNTER — Other Ambulatory Visit: Payer: Self-pay

## 2019-11-08 DIAGNOSIS — Z7982 Long term (current) use of aspirin: Secondary | ICD-10-CM

## 2019-11-08 DIAGNOSIS — K5669 Other partial intestinal obstruction: Secondary | ICD-10-CM | POA: Diagnosis not present

## 2019-11-08 DIAGNOSIS — K5651 Intestinal adhesions [bands], with partial obstruction: Secondary | ICD-10-CM | POA: Diagnosis not present

## 2019-11-08 DIAGNOSIS — Z888 Allergy status to other drugs, medicaments and biological substances status: Secondary | ICD-10-CM | POA: Diagnosis not present

## 2019-11-08 DIAGNOSIS — Z800 Family history of malignant neoplasm of digestive organs: Secondary | ICD-10-CM | POA: Diagnosis not present

## 2019-11-08 DIAGNOSIS — Z818 Family history of other mental and behavioral disorders: Secondary | ICD-10-CM

## 2019-11-08 DIAGNOSIS — E785 Hyperlipidemia, unspecified: Secondary | ICD-10-CM | POA: Diagnosis not present

## 2019-11-08 DIAGNOSIS — K219 Gastro-esophageal reflux disease without esophagitis: Secondary | ICD-10-CM | POA: Diagnosis present

## 2019-11-08 DIAGNOSIS — Z79899 Other long term (current) drug therapy: Secondary | ICD-10-CM | POA: Diagnosis not present

## 2019-11-08 DIAGNOSIS — Z20822 Contact with and (suspected) exposure to covid-19: Secondary | ICD-10-CM | POA: Diagnosis present

## 2019-11-08 DIAGNOSIS — Z8673 Personal history of transient ischemic attack (TIA), and cerebral infarction without residual deficits: Secondary | ICD-10-CM | POA: Diagnosis not present

## 2019-11-08 DIAGNOSIS — I10 Essential (primary) hypertension: Secondary | ICD-10-CM | POA: Diagnosis not present

## 2019-11-08 DIAGNOSIS — K660 Peritoneal adhesions (postprocedural) (postinfection): Secondary | ICD-10-CM | POA: Diagnosis not present

## 2019-11-08 DIAGNOSIS — E559 Vitamin D deficiency, unspecified: Secondary | ICD-10-CM | POA: Diagnosis present

## 2019-11-08 DIAGNOSIS — Z833 Family history of diabetes mellitus: Secondary | ICD-10-CM | POA: Diagnosis not present

## 2019-11-08 DIAGNOSIS — Z9851 Tubal ligation status: Secondary | ICD-10-CM | POA: Diagnosis not present

## 2019-11-08 DIAGNOSIS — K631 Perforation of intestine (nontraumatic): Secondary | ICD-10-CM | POA: Diagnosis not present

## 2019-11-08 DIAGNOSIS — Z801 Family history of malignant neoplasm of trachea, bronchus and lung: Secondary | ICD-10-CM

## 2019-11-08 DIAGNOSIS — K56609 Unspecified intestinal obstruction, unspecified as to partial versus complete obstruction: Secondary | ICD-10-CM | POA: Diagnosis present

## 2019-11-08 DIAGNOSIS — K565 Intestinal adhesions [bands], unspecified as to partial versus complete obstruction: Secondary | ICD-10-CM | POA: Diagnosis not present

## 2019-11-08 DIAGNOSIS — M1990 Unspecified osteoarthritis, unspecified site: Secondary | ICD-10-CM | POA: Diagnosis not present

## 2019-11-08 DIAGNOSIS — Z8249 Family history of ischemic heart disease and other diseases of the circulatory system: Secondary | ICD-10-CM | POA: Diagnosis not present

## 2019-11-08 DIAGNOSIS — F419 Anxiety disorder, unspecified: Secondary | ICD-10-CM | POA: Diagnosis present

## 2019-11-08 DIAGNOSIS — Z880 Allergy status to penicillin: Secondary | ICD-10-CM

## 2019-11-08 DIAGNOSIS — Z9109 Other allergy status, other than to drugs and biological substances: Secondary | ICD-10-CM

## 2019-11-08 DIAGNOSIS — Z820 Family history of epilepsy and other diseases of the nervous system: Secondary | ICD-10-CM

## 2019-11-08 DIAGNOSIS — Z813 Family history of other psychoactive substance abuse and dependence: Secondary | ICD-10-CM | POA: Diagnosis not present

## 2019-11-08 HISTORY — PX: LAPAROSCOPIC ABDOMINAL EXPLORATION: SHX6249

## 2019-11-08 HISTORY — PX: VENTRAL HERNIA REPAIR: SHX424

## 2019-11-08 SURGERY — EXPLORATION, ABDOMEN, LAPAROSCOPIC
Anesthesia: General

## 2019-11-08 MED ORDER — CHLORHEXIDINE GLUCONATE CLOTH 2 % EX PADS: 6.0000 | MEDICATED_PAD | Freq: Once | CUTANEOUS | Status: DC

## 2019-11-08 MED ORDER — DEXAMETHASONE SODIUM PHOSPHATE 10 MG/ML IJ SOLN
INTRAMUSCULAR | Status: DC | PRN
  Administered 2019-11-08: 10 mg via INTRAVENOUS

## 2019-11-08 MED ORDER — CITALOPRAM HYDROBROMIDE 20 MG PO TABS
10.0000 mg | ORAL_TABLET | Freq: Every day | ORAL | Status: DC
  Administered 2019-11-08 – 2019-11-10 (×3): 10 mg via ORAL
  Filled 2019-11-08 (×4): qty 1

## 2019-11-08 MED ORDER — LACTATED RINGERS IV SOLN: INTRAVENOUS | Status: DC

## 2019-11-08 MED ORDER — ALPRAZOLAM 0.5 MG PO TABS
0.5000 mg | ORAL_TABLET | Freq: Three times a day (TID) | ORAL | Status: DC | PRN
  Administered 2019-11-08 – 2019-11-10 (×4): 0.5 mg via ORAL
  Filled 2019-11-08 (×4): qty 1

## 2019-11-08 MED ORDER — PROMETHAZINE HCL 25 MG/ML IJ SOLN: 12.5000 mg | INTRAMUSCULAR | Status: DC | PRN

## 2019-11-08 MED ORDER — CHLORHEXIDINE GLUCONATE 0.12 % MT SOLN: 15.0000 mL | Freq: Once | OROMUCOSAL | Status: AC

## 2019-11-08 MED ORDER — FENTANYL CITRATE (PF) 100 MCG/2ML IJ SOLN
25.0000 ug | INTRAMUSCULAR | Status: DC | PRN
  Administered 2019-11-08 (×4): 25 ug via INTRAVENOUS

## 2019-11-08 MED ORDER — HYDROMORPHONE HCL 1 MG/ML IJ SOLN
0.2500 mg | INTRAMUSCULAR | Status: DC | PRN
  Administered 2019-11-08 (×4): 0.25 mg via INTRAVENOUS

## 2019-11-08 MED ORDER — ORAL CARE MOUTH RINSE: 15.0000 mL | Freq: Once | OROMUCOSAL | Status: AC

## 2019-11-08 MED ORDER — FLUTICASONE PROPIONATE 50 MCG/ACT NA SUSP
1.0000 | Freq: Every day | NASAL | Status: DC | PRN
  Filled 2019-11-08: qty 16

## 2019-11-08 MED ORDER — PHENYLEPHRINE HCL (PRESSORS) 10 MG/ML IV SOLN
INTRAVENOUS | Status: DC | PRN
  Administered 2019-11-08: 80 ug via INTRAVENOUS
  Administered 2019-11-08: 100 ug via INTRAVENOUS
  Administered 2019-11-08: 200 ug via INTRAVENOUS
  Administered 2019-11-08 (×3): 100 ug via INTRAVENOUS
  Administered 2019-11-08: 200 ug via INTRAVENOUS
  Administered 2019-11-08: 100 ug via INTRAVENOUS

## 2019-11-08 MED ORDER — MORPHINE SULFATE (PF) 2 MG/ML IV SOLN
2.0000 mg | INTRAVENOUS | Status: DC | PRN
  Administered 2019-11-08 – 2019-11-09 (×2): 2 mg via INTRAVENOUS
  Filled 2019-11-08 (×2): qty 1

## 2019-11-08 MED ORDER — ROCURONIUM BROMIDE 10 MG/ML (PF) SYRINGE
PREFILLED_SYRINGE | INTRAVENOUS | Status: AC
  Filled 2019-11-08: qty 10

## 2019-11-08 MED ORDER — IBUPROFEN 400 MG PO TABS
400.0000 mg | ORAL_TABLET | ORAL | Status: DC | PRN
  Administered 2019-11-09 – 2019-11-10 (×3): 400 mg via ORAL
  Filled 2019-11-08 (×3): qty 1

## 2019-11-08 MED ORDER — PROPOFOL 10 MG/ML IV BOLUS
INTRAVENOUS | Status: AC
  Filled 2019-11-08: qty 20

## 2019-11-08 MED ORDER — ONDANSETRON HCL 4 MG/2ML IJ SOLN
INTRAMUSCULAR | Status: AC
  Administered 2019-11-08: 4 mg via INTRAVENOUS
  Filled 2019-11-08: qty 2

## 2019-11-08 MED ORDER — ACETAMINOPHEN 10 MG/ML IV SOLN
INTRAVENOUS | Status: AC
  Filled 2019-11-08: qty 100

## 2019-11-08 MED ORDER — PANTOPRAZOLE SODIUM 40 MG PO TBEC
40.0000 mg | DELAYED_RELEASE_TABLET | Freq: Every day | ORAL | Status: DC
  Administered 2019-11-09: 40 mg via ORAL
  Filled 2019-11-08 (×2): qty 1

## 2019-11-08 MED ORDER — ACETAMINOPHEN 10 MG/ML IV SOLN
1000.0000 mg | Freq: Four times a day (QID) | INTRAVENOUS | Status: AC
  Administered 2019-11-08 – 2019-11-09 (×4): 1000 mg via INTRAVENOUS
  Filled 2019-11-08 (×4): qty 100

## 2019-11-08 MED ORDER — LIDOCAINE HCL (PF) 2 % IJ SOLN
INTRAMUSCULAR | Status: AC
  Filled 2019-11-08: qty 5

## 2019-11-08 MED ORDER — FENTANYL CITRATE (PF) 100 MCG/2ML IJ SOLN
INTRAMUSCULAR | Status: AC
  Filled 2019-11-08: qty 2

## 2019-11-08 MED ORDER — SODIUM CHLORIDE 0.9 % IV SOLN
INTRAVENOUS | Status: DC | PRN
  Administered 2019-11-08: 30 ug/min via INTRAVENOUS

## 2019-11-08 MED ORDER — CEFAZOLIN SODIUM-DEXTROSE 2-4 GM/100ML-% IV SOLN
2.0000 g | INTRAVENOUS | Status: AC
  Administered 2019-11-08: 2 g via INTRAVENOUS

## 2019-11-08 MED ORDER — FENTANYL CITRATE (PF) 100 MCG/2ML IJ SOLN
INTRAMUSCULAR | Status: AC
Start: 2019-11-08 — End: 2019-11-08
  Administered 2019-11-08: 25 ug via INTRAVENOUS
  Filled 2019-11-08: qty 2

## 2019-11-08 MED ORDER — HYDROMORPHONE HCL 1 MG/ML IJ SOLN: 0.2500 mg | INTRAMUSCULAR | Status: DC | PRN

## 2019-11-08 MED ORDER — ONDANSETRON HCL 4 MG/2ML IJ SOLN: 4.0000 mg | Freq: Once | INTRAMUSCULAR | Status: AC | PRN

## 2019-11-08 MED ORDER — ONDANSETRON HCL 4 MG/2ML IJ SOLN: 4.0000 mg | INTRAMUSCULAR | Status: DC | PRN

## 2019-11-08 MED ORDER — DEXTROSE IN LACTATED RINGERS 5 % IV SOLN: INTRAVENOUS | Status: DC

## 2019-11-08 MED ORDER — FENTANYL CITRATE (PF) 100 MCG/2ML IJ SOLN
INTRAMUSCULAR | Status: DC | PRN
Start: 2019-11-08 — End: 2019-11-08
  Administered 2019-11-08: 25 ug via INTRAVENOUS
  Administered 2019-11-08: 50 ug via INTRAVENOUS
  Administered 2019-11-08 (×2): 25 ug via INTRAVENOUS
  Administered 2019-11-08: 50 ug via INTRAVENOUS
  Administered 2019-11-08: 25 ug via INTRAVENOUS

## 2019-11-08 MED ORDER — ACETAMINOPHEN 10 MG/ML IV SOLN
INTRAVENOUS | Status: DC | PRN
  Administered 2019-11-08: 1000 mg via INTRAVENOUS

## 2019-11-08 MED ORDER — ONDANSETRON 4 MG PO TBDP
4.0000 mg | ORAL_TABLET | ORAL | Status: DC | PRN
  Administered 2019-11-08: 4 mg via ORAL
  Filled 2019-11-08: qty 1

## 2019-11-08 MED ORDER — PROPOFOL 10 MG/ML IV BOLUS
INTRAVENOUS | Status: DC | PRN
  Administered 2019-11-08: 140 mg via INTRAVENOUS

## 2019-11-08 MED ORDER — CEFAZOLIN SODIUM-DEXTROSE 2-4 GM/100ML-% IV SOLN
INTRAVENOUS | Status: AC
  Filled 2019-11-08: qty 100

## 2019-11-08 MED ORDER — MIDAZOLAM HCL 2 MG/2ML IJ SOLN
INTRAMUSCULAR | Status: AC
  Filled 2019-11-08: qty 2

## 2019-11-08 MED ORDER — ALBUTEROL SULFATE HFA 108 (90 BASE) MCG/ACT IN AERS
2.0000 | INHALATION_SPRAY | Freq: Four times a day (QID) | RESPIRATORY_TRACT | Status: DC | PRN
  Filled 2019-11-08: qty 6.7

## 2019-11-08 MED ORDER — KETOROLAC TROMETHAMINE 30 MG/ML IJ SOLN
INTRAMUSCULAR | Status: AC
  Administered 2019-11-08: 30 mg via INTRAVENOUS
  Filled 2019-11-08: qty 1

## 2019-11-08 MED ORDER — LIDOCAINE HCL (CARDIAC) PF 100 MG/5ML IV SOSY
PREFILLED_SYRINGE | INTRAVENOUS | Status: DC | PRN
  Administered 2019-11-08: 100 mg via INTRAVENOUS

## 2019-11-08 MED ORDER — CLINDAMYCIN PHOSPHATE 600 MG/50ML IV SOLN
INTRAVENOUS | Status: AC
  Filled 2019-11-08: qty 50

## 2019-11-08 MED ORDER — ONDANSETRON HCL 4 MG/2ML IJ SOLN
INTRAMUSCULAR | Status: DC | PRN
  Administered 2019-11-08: 4 mg via INTRAVENOUS

## 2019-11-08 MED ORDER — DEXAMETHASONE SODIUM PHOSPHATE 10 MG/ML IJ SOLN
INTRAMUSCULAR | Status: AC
  Filled 2019-11-08: qty 1

## 2019-11-08 MED ORDER — ONDANSETRON HCL 4 MG/2ML IJ SOLN
INTRAMUSCULAR | Status: AC
  Filled 2019-11-08: qty 2

## 2019-11-08 MED ORDER — ROCURONIUM BROMIDE 100 MG/10ML IV SOLN
INTRAVENOUS | Status: DC | PRN
  Administered 2019-11-08: 45 mg via INTRAVENOUS
  Administered 2019-11-08: 20 mg via INTRAVENOUS

## 2019-11-08 MED ORDER — CHLORHEXIDINE GLUCONATE 0.12 % MT SOLN
OROMUCOSAL | Status: AC
  Administered 2019-11-08: 15 mL via OROMUCOSAL
  Filled 2019-11-08: qty 15

## 2019-11-08 MED ORDER — ASPIRIN EC 81 MG PO TBEC
81.0000 mg | DELAYED_RELEASE_TABLET | Freq: Every day | ORAL | Status: DC
  Administered 2019-11-09 – 2019-11-10 (×2): 81 mg via ORAL
  Filled 2019-11-08 (×2): qty 1

## 2019-11-08 MED ORDER — KETOROLAC TROMETHAMINE 30 MG/ML IJ SOLN: 30.0000 mg | Freq: Once | INTRAMUSCULAR | Status: AC

## 2019-11-08 MED ORDER — SUGAMMADEX SODIUM 200 MG/2ML IV SOLN
INTRAVENOUS | Status: DC | PRN
  Administered 2019-11-08: 200 mg via INTRAVENOUS

## 2019-11-08 MED ORDER — HYDROMORPHONE HCL 1 MG/ML IJ SOLN
INTRAMUSCULAR | Status: AC
Start: 2019-11-08 — End: 2019-11-09
  Filled 2019-11-08: qty 1

## 2019-11-08 MED ORDER — SODIUM CHLORIDE 0.9 % IV SOLN
1.0000 g | Freq: Once | INTRAVENOUS | Status: AC
  Administered 2019-11-08: 1000 mg via INTRAVENOUS
  Filled 2019-11-08: qty 1

## 2019-11-08 MED ORDER — OXYCODONE HCL 5 MG PO TABS: 5.0000 mg | ORAL_TABLET | ORAL | Status: DC | PRN

## 2019-11-08 MED ORDER — BUPIVACAINE-EPINEPHRINE (PF) 0.5% -1:200000 IJ SOLN
INTRAMUSCULAR | Status: DC | PRN
  Administered 2019-11-08: 30 mL

## 2019-11-08 MED ORDER — CLINDAMYCIN PHOSPHATE 600 MG/50ML IV SOLN
INTRAVENOUS | Status: DC | PRN
  Administered 2019-11-08: 600 mg via INTRAVENOUS

## 2019-11-08 MED ORDER — ENOXAPARIN SODIUM 40 MG/0.4ML ~~LOC~~ SOLN
40.0000 mg | SUBCUTANEOUS | Status: DC
  Administered 2019-11-09 – 2019-11-10 (×2): 40 mg via SUBCUTANEOUS
  Filled 2019-11-08 (×2): qty 0.4

## 2019-11-08 SURGICAL SUPPLY — 60 items
APL PRP STRL LF DISP 70% ISPRP (MISCELLANEOUS) ×1
APL SKNCLS STERI-STRIP NONHPOA (GAUZE/BANDAGES/DRESSINGS) ×1
BAG COUNTER SPONGE EZ (MISCELLANEOUS) IMPLANT
BAG SPNG 4X4 CLR HAZ (MISCELLANEOUS)
BENZOIN TINCTURE PRP APPL 2/3 (GAUZE/BANDAGES/DRESSINGS) ×3 IMPLANT
BLADE SURG 11 STRL SS SAFETY (MISCELLANEOUS) ×3 IMPLANT
BLADE SURG 15 STRL SS SAFETY (BLADE) ×3 IMPLANT
BULB RESERV EVAC DRAIN JP 100C (MISCELLANEOUS) IMPLANT
CANISTER SUCT 1200ML W/VALVE (MISCELLANEOUS) ×3 IMPLANT
CANNULA DILATOR  5MM W/SLV (CANNULA) ×1
CANNULA DILATOR 10 W/SLV (CANNULA) ×2 IMPLANT
CANNULA DILATOR 10MM W/SLV (CANNULA) ×1
CANNULA DILATOR 5 W/SLV (CANNULA) ×2 IMPLANT
CHLORAPREP W/TINT 26 (MISCELLANEOUS) ×3 IMPLANT
CLOSURE WOUND 1/2 X4 (GAUZE/BANDAGES/DRESSINGS) ×1
COUNTER SPONGE BAG EZ (MISCELLANEOUS)
COVER WAND RF STERILE (DRAPES) ×3 IMPLANT
DRAIN CHANNEL JP 15F RND 16 (MISCELLANEOUS) IMPLANT
DRAPE LAPAROTOMY 100X77 ABD (DRAPES) ×3 IMPLANT
DRSG TEGADERM 2-3/8X2-3/4 SM (GAUZE/BANDAGES/DRESSINGS) ×9 IMPLANT
DRSG TEGADERM 4X4.75 (GAUZE/BANDAGES/DRESSINGS) ×6 IMPLANT
DRSG TELFA 3X8 NADH (GAUZE/BANDAGES/DRESSINGS) ×3 IMPLANT
DRSG TELFA 4X3 1S NADH ST (GAUZE/BANDAGES/DRESSINGS) ×3 IMPLANT
ELECT REM PT RETURN 9FT ADLT (ELECTROSURGICAL) ×3
ELECTRODE REM PT RTRN 9FT ADLT (ELECTROSURGICAL) ×1 IMPLANT
GAUZE SPONGE 4X4 12PLY STRL (GAUZE/BANDAGES/DRESSINGS) IMPLANT
GLOVE BIO SURGEON STRL SZ7.5 (GLOVE) ×13 IMPLANT
GLOVE INDICATOR 8.0 STRL GRN (GLOVE) ×3 IMPLANT
GOWN STRL REUS W/ TWL LRG LVL3 (GOWN DISPOSABLE) ×2 IMPLANT
GOWN STRL REUS W/TWL LRG LVL3 (GOWN DISPOSABLE) ×9
KIT TURNOVER KIT A (KITS) ×3 IMPLANT
LABEL OR SOLS (LABEL) ×3 IMPLANT
NDL HYPO 25X1 1.5 SAFETY (NEEDLE) ×1 IMPLANT
NEEDLE HYPO 22GX1.5 SAFETY (NEEDLE) ×3 IMPLANT
NEEDLE HYPO 25X1 1.5 SAFETY (NEEDLE) ×3 IMPLANT
NS IRRIG 500ML POUR BTL (IV SOLUTION) ×3 IMPLANT
PACK BASIN MINOR (MISCELLANEOUS) ×3 IMPLANT
PACK LAP CHOLECYSTECTOMY (MISCELLANEOUS) ×3 IMPLANT
PAD DRESSING TELFA 3X8 NADH (GAUZE/BANDAGES/DRESSINGS) ×1 IMPLANT
POUCH ENDO CATCH 10MM SPEC (MISCELLANEOUS) IMPLANT
RETRACTOR WND ALEXIS-O 25 LRG (MISCELLANEOUS) IMPLANT
RTRCTR WOUND ALEXIS O 25CM LRG (MISCELLANEOUS) ×3
SET TUBE SMOKE EVAC HIGH FLOW (TUBING) ×3 IMPLANT
SPONGE LAP 18X18 RF (DISPOSABLE) ×1 IMPLANT
STAPLER SKIN PROX 35W (STAPLE) IMPLANT
STRIP CLOSURE SKIN 1/2X4 (GAUZE/BANDAGES/DRESSINGS) ×2 IMPLANT
SUT MAXON ABS #0 GS21 30IN (SUTURE) ×10 IMPLANT
SUT SILK 3-0 (SUTURE) ×2 IMPLANT
SUT SURGILON 0 BLK (SUTURE) ×1 IMPLANT
SUT VIC AB 0 SH 27 (SUTURE) ×3 IMPLANT
SUT VIC AB 2-0 BRD 54 (SUTURE) ×5 IMPLANT
SUT VIC AB 2-0 CT1 (SUTURE) ×4 IMPLANT
SUT VIC AB 3-0 SH 27 (SUTURE) ×3
SUT VIC AB 3-0 SH 27X BRD (SUTURE) ×1 IMPLANT
SUT VIC AB 4-0 FS2 27 (SUTURE) ×3 IMPLANT
SUT VICRYL+ 3-0 144IN (SUTURE) ×1 IMPLANT
SWABSTK COMLB BENZOIN TINCTURE (MISCELLANEOUS) ×3 IMPLANT
SYR 10ML LL (SYRINGE) ×3 IMPLANT
SYR 3ML LL SCALE MARK (SYRINGE) ×3 IMPLANT
TRAY FOLEY MTR SLVR 16FR STAT (SET/KITS/TRAYS/PACK) ×2 IMPLANT

## 2019-11-08 NOTE — Anesthesia Procedure Notes (Signed)
Procedure Name: Intubation Date/Time: 11/08/2019 12:34 PM Performed by: Allean Found, CRNA Pre-anesthesia Checklist: Patient identified, Patient being monitored, Timeout performed, Emergency Drugs available and Suction available Patient Re-evaluated:Patient Re-evaluated prior to induction Oxygen Delivery Method: Circle system utilized Preoxygenation: Pre-oxygenation with 100% oxygen Induction Type: IV induction Ventilation: Mask ventilation without difficulty Laryngoscope Size: 3 and McGraph Grade View: Grade II Tube type: Oral Tube size: 7.0 mm Number of attempts: 1 Airway Equipment and Method: Stylet Placement Confirmation: ETT inserted through vocal cords under direct vision,  positive ETCO2 and breath sounds checked- equal and bilateral Secured at: 21 cm Tube secured with: Tape Dental Injury: Teeth and Oropharynx as per pre-operative assessment

## 2019-11-08 NOTE — Anesthesia Preprocedure Evaluation (Signed)
Anesthesia Evaluation  Patient identified by MRN, date of birth, ID band Patient awake    Reviewed: Allergy & Precautions, NPO status , Patient's Chart, lab work & pertinent test results  History of Anesthesia Complications Negative for: history of anesthetic complications  Airway Mallampati: II       Dental   Pulmonary neg sleep apnea, neg COPD, Not current smoker,           Cardiovascular hypertension, Pt. on medications (-) Past MI and (-) CHF (-) dysrhythmias (-) Valvular Problems/Murmurs     Neuro/Psych neg Seizures Anxiety Depression TIA (found on CT)   GI/Hepatic Neg liver ROS, GERD  Medicated and Controlled,  Endo/Other  neg diabetes  Renal/GU negative Renal ROS     Musculoskeletal   Abdominal   Peds  Hematology   Anesthesia Other Findings   Reproductive/Obstetrics                             Anesthesia Physical Anesthesia Plan  ASA: II  Anesthesia Plan: General   Post-op Pain Management:    Induction: Intravenous  PONV Risk Score and Plan: 3 and Ondansetron, Dexamethasone and Treatment may vary due to age or medical condition  Airway Management Planned: Oral ETT  Additional Equipment:   Intra-op Plan:   Post-operative Plan:   Informed Consent:   Plan Discussed with:   Anesthesia Plan Comments:         Anesthesia Quick Evaluation

## 2019-11-08 NOTE — Treatment Plan (Addendum)
Pt to unit via bed at this time, pt is drowsy but able to answer questions, oriented x 4. Pt has noted scant serosanguinous drainage to honeycomb dressing. Ice applied. VSS, Tatum intact. Last BM 11-07-19. Pt spouse at bedside, no skin issues noted. Pt has purse, shoes clothing at bedside. Pt and husband oriented to room at this time. IV tubing changed.

## 2019-11-08 NOTE — Transfer of Care (Signed)
Immediate Anesthesia Transfer of Care Note  Patient: [name]  Procedure(s) Performed: LAPAROSCOPIC ABDOMINAL EXPLORATION (N/A ) HERNIA REPAIR VENTRAL ADULT (N/A )  Patient Location: PACU  Anesthesia Type:General  Level of Consciousness: awake, alert  and oriented  Airway & Oxygen Therapy: Patient Spontanous Breathing and Patient connected to face mask oxygen  Post-op Assessment: Report given to RN and Post -op Vital signs reviewed and stable  Post vital signs: Reviewed and stable  Last Vitals:  Vitals Value Taken Time  BP 119/69 11/08/19 1426  Temp 36.7 C 11/08/19 1426  Pulse 68 11/08/19 1438  Resp 16 11/08/19 1438  SpO2 98 % 11/08/19 1438  Vitals shown include unvalidated device data.  Last Pain:  Vitals:   11/08/19 1426  TempSrc:   PainSc: Asleep         Complications: No complications documented.

## 2019-11-08 NOTE — H&P (Signed)
No change in clinical history or exam.  For laparoscopy/ laparotomy for recurrent partial small bowel obstruction.

## 2019-11-08 NOTE — Op Note (Signed)
Pr received Ancef prior to the procedure.  The abdomen was cleansed with ChloraPrep after placement of Foley catheter by the nurse and induction of general endotracheal anesthesia.  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  Postoperative diagnosis: Same, iatrogenic injury to the transverse colon.  Operative procedure: Attempted diagnostic laparoscopy, laparotomy with repair of colonic puncture, lysis of adhesions, right salpinx removal.  Operating surgeon: Hervey Ard, MD.  Assistant: Arvilla Meres, RNFA.  Anesthesia: General endotracheal, Marcaine 0.5% with 1: 200,000's of epinephrine, 30 cc.  Clinical note: This 74 year old woman has had episodic partial small bowel obstruction occurring approximately every 9 months.  Most recent episode was in the last 1-2 months.  During these episodes she will present with nausea vomiting bloating and CT imaging suggesting partial small bowel obstruction focused in the right lower quadrant.  After her most recent episode it was felt reasonable to attempt laparoscopy for lysis of adhesions with the possibility of laparotomy if needed.  Preoperative colonoscopy was unremarkable.  Operative note: The patient underwent general endotracheal anesthesia without difficulty.  A Foley catheter was placed by the nurs SCD stockings for DVT prevention.  Preoperative review of the CT suggested a clear space in the left upper quadrant.  2 fingerbreadths below the costal margin at the midclavicular line a small incision was made and a varies needle placed.  The hanging drop test was utilized and this showed normal results.  The abdomen was then insufflated without difficulty making use of CO2 to 10 mmHg pressure.  A 5 mm operative port was then utilized and this was advanced.  At 1 point it seemed that it was hanging up and it was gently advanced without direct visualization.  The camera was inserted  and here was found to be intraluminal within the colon.  At this point laparoscopic portion was terminated and a laparotomy initiated.  The abdomen was opened along majority of the previous upper midline incision and extended down below the umbilicus with the anticipated source of recurrent obstruction in the right lower quadrant.  The skin was incised sharply and remaining dissection was completed with electrocautery.  Safe entry into the abdomen was undertaken.  Adhesions in the upper abdomen related to the patient's prior transverse colectomy were taken down with sharp and cautery dissection.  The injury point in the remaining portion of the transverse colon was identified.  This was a single entry and repaired with interrupted 3-0 silk full-thickness sutures x2.  No spillage noted.  At this point lysis of adhesion was undertaken from the anterior abdominal wall.  Once this was cleared a large Alexis wound protector was placed.  The patient was found to have an adhesive band from the right salpinx to the base of the right colon mesentery that was the source of intermittent obstruction.  This was resected and something sent for routine histology.  Hemostasis with a 3-0 Vicryl tie.  The small bowel was run from the terminal ileum to the ligament of Treitz.  Some nonobstructive adhesions were taken down to completely free the small bowel.  No other areas suspicious for intermittent obstruction were noted.  The colotomy site was reexamined and found to be hemostatic and airtight.  The abdomen was irrigated with normal saline.  The midline wound was closed with interrupted 0 Maxon figure-of-eight sutures.  The adipose layer was closed with a running 2-0 Vicryl suture in 2 segments.  Skin was closed with staples.  Marcaine  was infiltrated for postoperative analgesia.  A honeycomb dressing was applied.  The Foley catheter was removed and the patient was taken recovery in stable condition.

## 2019-11-09 ENCOUNTER — Encounter: Payer: Self-pay | Admitting: General Surgery

## 2019-11-09 NOTE — Progress Notes (Signed)
Pt rested well throughout night. Pt given scheduled tylenol and PRN Morphine once for pain. Pt ambulated around the nursing station once last night and twice this AM.

## 2019-11-09 NOTE — Anesthesia Postprocedure Evaluation (Signed)
Anesthesia Post Note  Patient: [name]  Procedure(s) Performed: LAPAROSCOPIC ABDOMINAL EXPLORATION (N/A ) HERNIA REPAIR VENTRAL ADULT (N/A )  Patient location during evaluation: PACU Anesthesia Type: General Level of consciousness: awake and alert Pain management: pain level controlled Vital Signs Assessment: post-procedure vital signs reviewed and stable Respiratory status: spontaneous breathing, nonlabored ventilation and respiratory function stable Cardiovascular status: blood pressure returned to baseline and stable Postop Assessment: no apparent nausea or vomiting Anesthetic complications: no   No complications documented.   Last Vitals:  Vitals:   11/08/19 2257 11/09/19 0418  BP: 118/76 122/75  Pulse: 76 73  Resp: 16   Temp: 36.4 C 36.5 C  SpO2: 97% 97%    Last Pain:  Vitals:   11/09/19 0813  TempSrc:   PainSc: [place]

## 2019-11-10 LAB — SURGICAL PATHOLOGY

## 2019-11-10 MED ORDER — HYDROCODONE-ACETAMINOPHEN 5-325 MG PO TABS: 1.0000 | ORAL_TABLET | ORAL | 0 refills | Status: DC | PRN

## 2019-11-10 NOTE — Progress Notes (Signed)
Pt discharged per MD order. IV removed. Discharge instructions reviewed with pt. Pt verbalized understanding with all questions answered to pt satisfaction. Pt taken to car in wheelchair by staff.  

## 2019-11-10 NOTE — Final Progress Note (Signed)
Small amount of fresh blood at midportion of incision, likely secondary to Lovenox.  Fresh gauze/ Tegaderm dressing applied.  Reviewed that right fallopian tube was causing intermittent obstruction, and was removed.  (Path fine).  Reviewed diet (avoid raw fruits/ vegetables/ nuts) until bowels back to normal.

## 2019-11-10 NOTE — Progress Notes (Signed)
AVSS. Minimal pain. Tolerated diet advance.  Passing gas. Ambulating well. ABD: Soft.  Wound: No new drainage.  Reviewed op events (colon perf).  Plan: Advance diet, tentative d/c this PM.

## 2019-11-15 ENCOUNTER — Other Ambulatory Visit: Payer: Self-pay | Admitting: Family Medicine

## 2019-11-15 DIAGNOSIS — G8929 Other chronic pain: Secondary | ICD-10-CM

## 2019-11-16 ENCOUNTER — Ambulatory Visit: Payer: Self-pay | Admitting: *Deleted

## 2019-11-16 NOTE — Chronic Care Management (AMB) (Signed)
  Chronic Care Management   Social Work Note  11/16/2019 Name: [name] MRN: [id number] DOB: 1945/06/02  [name] is a 74 y.o. year old female who sees [name], [name], MD for primary care. The CCM team was consulted for assistance with Mental Health Counseling and Resources.   Follow up phone call to patient to provide emotional support and to assess for community resource needs following her recent procedure. Patient states that she is doing "fair" and was on her way to a follow up appointment. Patient stated that she had no needs at this time but requested a call back to talk further as she was on her way to a doctor's appointment.  SDOH (Social Determinants of Health) assessments performed: No     Outpatient Encounter Medications as of 11/16/2019  Medication Sig Note  . albuterol (VENTOLIN HFA) 108 (90 Base) MCG/ACT inhaler TAKE 2 PUFFS BY MOUTH EVERY 6 HOURS AS NEEDED FOR WHEEZE OR SHORTNESS OF BREATH (Patient taking differently: Inhale 2 puffs into the lungs every 6 (six) hours as needed for wheezing or shortness of breath. )   . ALPRAZolam (XANAX) 0.5 MG tablet TAKE 1 TABLET (0.5 MG TOTAL) BY MOUTH 3 (THREE) TIMES DAILY AS NEEDED FOR ANXIETY OR SLEEP. (Patient taking differently: Take 0.5 mg by mouth 2 (two) times daily as needed for anxiety or sleep. )   . aspirin EC 81 MG tablet Take 81 mg by mouth daily.   . citalopram (CELEXA) 10 MG tablet TAKE 1 TABLET BY MOUTH EVERY DAY   . fluticasone (FLONASE) 50 MCG/ACT nasal spray PLACE 1-2 SPRAYS INTO BOTH NOSTRILS DAILY AS NEEDED FOR ALLERGIES OR RHINITIS.   . HYDROcodone-acetaminophen (NORCO/VICODIN) 5-325 MG tablet Take 1 tablet by mouth every 4 (four) hours as needed for moderate pain.   [name] ibuprofen (ADVIL) 800 MG tablet Take 1 tablet (800 mg total) by mouth daily as needed (severe pain).   [name] omeprazole (PRILOSEC) 20 MG capsule Take 20 mg by mouth daily as needed (acid reflux).    . venlafaxine XR (EFFEXOR-XR) 37.5 MG 24 hr  capsule Take 37.5 mg by mouth daily. (Patient not taking: Reported on 10/20/2019) 09/21/2019: Last fill was 05/13/19 for 90 day supply   No facility-administered encounter medications on file as of 11/16/2019.    Goals Addressed   None     Follow Up Plan: SW will follow up with patient by phone over the next 7-14 business days   [place], [place] Worker  [facility] Care Management [phone]

## 2019-11-18 NOTE — Discharge Summary (Signed)
Physician Discharge Summary  Patient ID: [name] MRN: [id number] DOB/AGE: 10-16-1945 74 y.o.  Admit date: 11/08/2019 Discharge date: 11/18/2019  Admission Diagnoses: Recurrent small bowel obstruction   Discharge Diagnoses:  Active Problems:   SBO (small bowel obstruction) (HCC)   Discharged Condition: good  Hospital Course: Admitted after laparoscopy/ laparotomy. Repair of colon perforation (iatrogenic) and resection of the right tube that was causing intermittent distal small bowel obstruction. Diet advanced without incident.  No difficulty with pain control. Discharged home on POD 2.   Consults: None  Significant Diagnostic Studies: pathology: Normal tube.   Treatments: IV hydration  Discharge Exam: Blood pressure (!) 145/80, pulse 65, temperature 98.4 F (36.9 C), temperature source Oral, resp. rate 18, height 5\' 3"  (1.6 m), weight 77 kg, SpO2 100 %. General appearance: alert and cooperative Resp: clear to auscultation bilaterally Cardio: regular rate and rhythm, S1, S2 normal, no murmur, click, rub or gallop GI: soft, non-tender; bowel sounds normal; no masses,  no organomegaly Incision/Wound: Small amount of blood along the dressing.   Disposition: Discharge disposition: 01-Home or Self Care       Discharge Instructions    Diet - low sodium heart healthy   Complete by: As directed    Discharge instructions   Complete by: As directed    Heating pad to abdomen for comfort.  OK to shower.  Tylenol/ Advil/ Aleve: If needed for soreness.  Norco (hydrocodone): If needed for pain.  This medication may constipate.  Laxative of choice if needed.  No driving until pain free.  Avoid lifting over 10 pounds.  The plastic dressing applied at the time of surgery can remain in place until office follow up.   Increase activity slowly   Complete by: As directed    No wound care   Complete by: As directed      Allergies as of 11/10/2019      Reactions    Levofloxacin    Tongue and mouth swelling   Calcium Channel Blockers    Other reaction(s): Dizziness   Amoxicillin Other (See Comments)   Causes yeast infection Has patient had a PCN reaction causing immediate rash, facial/tongue/throat swelling, SOB or lightheadedness with hypotension: No Has patient had a PCN reaction causing severe rash involving mucus membranes or skin necrosis: No Has patient had a PCN reaction that required hospitalization: No Has patient had a PCN reaction occurring within the last 10 years: No If all of the above answers are "NO", then may proceed with Cephalosporin use.   Nickel Rash   Penicillins Other (See Comments)   Causes yeast infection Has patient had a PCN reaction causing immediate rash, facial/tongue/throat swelling, SOB or lightheadedness with hypotension: No Has patient had a PCN reaction causing severe rash involving mucus membranes or skin necrosis: No Has patient had a PCN reaction that required hospitalization: No Has patient had a PCN reaction occurring within the last 10 years: No If all of the above answers are "NO", then may proceed with Cephalosporin use.      Medication List    TAKE these medications   albuterol 108 (90 Base) MCG/ACT inhaler Commonly known as: VENTOLIN HFA TAKE 2 PUFFS BY MOUTH EVERY 6 HOURS AS NEEDED FOR WHEEZE OR SHORTNESS OF BREATH What changed: See the new instructions.   ALPRAZolam 0.5 MG tablet Commonly known as: XANAX TAKE 1 TABLET (0.5 MG TOTAL) BY MOUTH 3 (THREE) TIMES DAILY AS NEEDED FOR ANXIETY OR SLEEP. What changed: when to take this  aspirin EC 81 MG tablet Take 81 mg by mouth daily.   citalopram 10 MG tablet Commonly known as: CELEXA TAKE 1 TABLET BY MOUTH EVERY DAY   fluticasone 50 MCG/ACT nasal spray Commonly known as: FLONASE PLACE 1-2 SPRAYS INTO BOTH NOSTRILS DAILY AS NEEDED FOR ALLERGIES OR RHINITIS.   HYDROcodone-acetaminophen 5-325 MG tablet Commonly known as: NORCO/VICODIN Take 1  tablet by mouth every 4 (four) hours as needed for moderate pain.   omeprazole 20 MG capsule Commonly known as: PRILOSEC Take 20 mg by mouth daily as needed (acid reflux).   venlafaxine XR 37.5 MG 24 hr capsule Commonly known as: EFFEXOR-XR Take 37.5 mg by mouth daily.       Follow-up Information    Jacari Iannello, Merrily Pew, MD Follow up.   Specialties: General Surgery, Radiology Why: Tuesday, September 28 at 10:45 AM. Contact information: [address] [place] [address] [phone]               Signed: Earline Mayotte 11/18/2019, 12:39 PM

## 2019-11-23 ENCOUNTER — Ambulatory Visit (INDEPENDENT_AMBULATORY_CARE_PROVIDER_SITE_OTHER): Payer: Medicare Other | Admitting: *Deleted

## 2019-11-23 DIAGNOSIS — F329 Major depressive disorder, single episode, unspecified: Secondary | ICD-10-CM | POA: Diagnosis not present

## 2019-11-23 DIAGNOSIS — F419 Anxiety disorder, unspecified: Secondary | ICD-10-CM

## 2019-11-23 NOTE — Chronic Care Management (AMB) (Signed)
Chronic Care Management    Clinical Social Work Follow Up Note  11/23/2019 Name: MANDOLIN FALWELL MRN: [id number] DOB: 11-Jul-1945  [name] is a 74 y.o. year old female who is a primary care patient of [name], [name], MD. The CCM team was consulted for assistance with Mental Health Counseling and Resources.   Review of patient status, including review of consultants reports, other relevant assessments, and collaboration with appropriate care team members and the patient's provider was performed as part of comprehensive patient evaluation and provision of chronic care management services.    SDOH (Social Determinants of Health) assessments performed: No    Outpatient Encounter Medications as of 11/23/2019  Medication Sig Note  . albuterol (VENTOLIN HFA) 108 (90 Base) MCG/ACT inhaler TAKE 2 PUFFS BY MOUTH EVERY 6 HOURS AS NEEDED FOR WHEEZE OR SHORTNESS OF BREATH (Patient taking differently: Inhale 2 puffs into the lungs every 6 (six) hours as needed for wheezing or shortness of breath. )   . ALPRAZolam (XANAX) 0.5 MG tablet TAKE 1 TABLET (0.5 MG TOTAL) BY MOUTH 3 (THREE) TIMES DAILY AS NEEDED FOR ANXIETY OR SLEEP. (Patient taking differently: Take 0.5 mg by mouth 2 (two) times daily as needed for anxiety or sleep. )   . aspirin EC 81 MG tablet Take 81 mg by mouth daily.   . citalopram (CELEXA) 10 MG tablet TAKE 1 TABLET BY MOUTH EVERY DAY   . fluticasone (FLONASE) 50 MCG/ACT nasal spray PLACE 1-2 SPRAYS INTO BOTH NOSTRILS DAILY AS NEEDED FOR ALLERGIES OR RHINITIS.   . HYDROcodone-acetaminophen (NORCO/VICODIN) 5-325 MG tablet Take 1 tablet by mouth every 4 (four) hours as needed for moderate pain.   [name] ibuprofen (ADVIL) 800 MG tablet Take 1 tablet (800 mg total) by mouth daily as needed (severe pain).   [name] omeprazole (PRILOSEC) 20 MG capsule Take 20 mg by mouth daily as needed (acid reflux).    . venlafaxine XR (EFFEXOR-XR) 37.5 MG 24 hr capsule Take 37.5 mg by mouth daily. (Patient not  taking: Reported on 10/20/2019) 09/21/2019: Last fill was 05/13/19 for 90 day supply   No facility-administered encounter medications on file as of 11/23/2019.     Goals Addressed              This Visit's Progress   .  "I would like to manage my depression better" (pt-stated)        Current Barriers:  [name] Mental Health Concerns   Clinical Social Work Clinical Goal(s):  [name] Over the next 90 days, client will work with SW to address concerns related to depression Interventions: . Followed up with patient in regards to her management of stress and depression . Patient continues to recover from her recent procedure on 11/08/19 . Patient discussed positive network of support from her family . Discussed relationship concerns and affect on her health . Continued to provided patient with positive reinforcement and emotional support  . Patient has now declined follow up with a psychiatrist as previously requested by patient . Discussed plans with patient for ongoing care management follow up and provided patient with direct contact information for care management team  Patient Self Care Activities:  . Performs ADL's independently . Performs IADL's independently . Calls provider office for new concerns or questions  Please see past updates related to this goal by clicking on the "Past Updates" button in the selected goal          Follow Up Plan: SW will follow up with patient  by phone over the next 7-14 business days    [place], [place] Worker  [facility] Care [facility] [phone]

## 2019-11-23 NOTE — Patient Instructions (Addendum)
Thank you allowing the Chronic Care Management Team to be a part of your care! It was a pleasure speaking with you today!  1. Please call this social worker with any additional questions or concerns regarding your mental health needs  CCM (Chronic Care Management) Team   Neldon Labella RN, BSN Nurse Care Coordinator  [phone]  [place], LCSW Clinical Social Worker [phone]  Goals Addressed              This Visit's Progress   .  "I would like to manage my depression better" (pt-stated)        Current Barriers:  [name] Mental Health Concerns   Clinical Social Work Clinical Goal(s):  [name] Over the next 90 days, client will work with SW to address concerns related to depression Interventions: . Followed up with patient in regards to her management of stress and depression . Patient continues to recover from her recent procedure on 11/08/19 . Patient discussed positive network of support from her family . Discussed relationship concerns and affect on her health . Continued to provided patient with positive reinforcement and emotional support  . Patient has now declined follow up with a psychiatrist as previously requested by patient . Discussed plans with patient for ongoing care management follow up and provided patient with direct contact information for care management team  Patient Self Care Activities:  . Performs ADL's independently . Performs IADL's independently . Calls provider office for new concerns or questions  Please see past updates related to this goal by clicking on the "Past Updates" button in the selected goal          The patient verbalized understanding of instructions provided today and declined a print copy of patient instruction materials.   Telephone follow up appointment with care management team member scheduled for:  12/09/19

## 2019-12-07 ENCOUNTER — Telehealth: Payer: Self-pay

## 2019-12-07 NOTE — Telephone Encounter (Signed)
Is the patient trying to reach you to follow up from your visit with patient on 11/23/2019?

## 2019-12-07 NOTE — Telephone Encounter (Signed)
Copied from [place] [id number]. Topic: Quick Communication - See Telephone Encounter >> Dec 07, 2019 11:47 AM Loma [place] wrote: CRM for notification. See Telephone encounter for: 12/07/19. Pt wants a cb b/c everytime she comes in she is asked for her living will or her trust papers??? She says she can not come in without being ask.Marland KitchenMarland KitchenNot seeing reference in chart. Pt does not want to come back in without having it as tired of being asked, pt wants a fu [phone]

## 2019-12-08 ENCOUNTER — Ambulatory Visit: Payer: Self-pay | Admitting: *Deleted

## 2019-12-08 DIAGNOSIS — F329 Major depressive disorder, single episode, unspecified: Secondary | ICD-10-CM | POA: Diagnosis not present

## 2019-12-08 DIAGNOSIS — F419 Anxiety disorder, unspecified: Secondary | ICD-10-CM

## 2019-12-08 NOTE — Patient Instructions (Addendum)
Thank you allowing the Chronic Care Management Team to be a part of your care! It was a pleasure speaking with you today!  1. Please contact the Elder Law Firm of choice to discuss estate planning  CCM (Chronic Care Management) Team   Neldon Labella RN, BSN Nurse Care Coordinator  [phone]  [place], LCSW Clinical Social Worker [phone]  Goals Addressed              This Visit's Progress   .  "I would like a living trust" (pt-stated)        [place] (see longitudinal plan of care for additional care plan information)  Current Barriers:  . Lacks knowledge of community resource: regarding [place] Work Clinical Goal(s):  [name] Over the next 90 days, patient will work with a Copywriter, advertising to address needs related to [name]  Interventions: . Inter-disciplinary care team collaboration (see longitudinal plan of care) . Patient discussed need to get her affairs in order and would like resources to complete her estate planning . Positive reinforcement provided for motivation to have end of life plans . Provided patient with list of Elder Law Attorneys that may be able to assist with completing her Will and [place] patient to contact the agency of choice to discuss estate planning  Patient Self Care Activities:  . Performs ADL's independently . Performs IADL's independently . Knowledge deficit regarding legal resources to complete Living Trust  Initial goal documentation         The patient verbalized understanding of instructions provided today and declined a print copy of patient instruction materials.   Telephone follow up appointment with care management team member scheduled for:  12/16/19

## 2019-12-08 NOTE — Chronic Care Management (AMB) (Signed)
  Chronic Care Management   Social Work Note  12/08/2019 Name: [name] MRN: [id number] DOB: Dec 28, 1945  [name] is a 74 y.o. year old female who sees [name], [name], MD for primary care. The CCM team was consulted for assistance with Mental Health Counseling and Resources.   Phone call to patient to address concerns related to her Advanced Directive and Living Will. Patient answered, however stated that she was pre-occupied and would call this social worker back later today.  SDOH (Social Determinants of Health) assessments performed: No     Outpatient Encounter Medications as of 12/08/2019  Medication Sig Note  . albuterol (VENTOLIN HFA) 108 (90 Base) MCG/ACT inhaler TAKE 2 PUFFS BY MOUTH EVERY 6 HOURS AS NEEDED FOR WHEEZE OR SHORTNESS OF BREATH (Patient taking differently: Inhale 2 puffs into the lungs every 6 (six) hours as needed for wheezing or shortness of breath. )   . ALPRAZolam (XANAX) 0.5 MG tablet TAKE 1 TABLET (0.5 MG TOTAL) BY MOUTH 3 (THREE) TIMES DAILY AS NEEDED FOR ANXIETY OR SLEEP. (Patient taking differently: Take 0.5 mg by mouth 2 (two) times daily as needed for anxiety or sleep. )   . aspirin EC 81 MG tablet Take 81 mg by mouth daily.   . citalopram (CELEXA) 10 MG tablet TAKE 1 TABLET BY MOUTH EVERY DAY   . fluticasone (FLONASE) 50 MCG/ACT nasal spray PLACE 1-2 SPRAYS INTO BOTH NOSTRILS DAILY AS NEEDED FOR ALLERGIES OR RHINITIS.   . HYDROcodone-acetaminophen (NORCO/VICODIN) 5-325 MG tablet Take 1 tablet by mouth every 4 (four) hours as needed for moderate pain.   [name] ibuprofen (ADVIL) 800 MG tablet Take 1 tablet (800 mg total) by mouth daily as needed (severe pain).   [name] omeprazole (PRILOSEC) 20 MG capsule Take 20 mg by mouth daily as needed (acid reflux).    . venlafaxine XR (EFFEXOR-XR) 37.5 MG 24 hr capsule Take 37.5 mg by mouth daily. (Patient not taking: Reported on 10/20/2019) 09/21/2019: Last fill was 05/13/19 for 90 day supply   No facility-administered  encounter medications on file as of 12/08/2019.    Goals Addressed   None     Follow Up Plan: SW will follow up with patient by phone over the next 7-10 business days   [place], [place] Worker  [facility] Care Management [phone]

## 2019-12-08 NOTE — Chronic Care Management (AMB) (Signed)
Chronic Care Management    Clinical Social Work Follow Up Note  12/08/2019 Name: [name] MRN: [id number] DOB: 1945/07/24  [name] is a 74 y.o. year old female who is a primary care patient of [name], Wanda Peri, MD. The CCM team was consulted for assistance with [employer] .   Review of patient status, including review of consultants reports, other relevant assessments, and collaboration with appropriate care team members and the patient's provider was performed as part of comprehensive patient evaluation and provision of chronic care management services.    SDOH (Social Determinants of Health) assessments performed: No    Outpatient Encounter Medications as of 12/08/2019  Medication Sig Note  . albuterol (VENTOLIN HFA) 108 (90 Base) MCG/ACT inhaler TAKE 2 PUFFS BY MOUTH EVERY 6 HOURS AS NEEDED FOR WHEEZE OR SHORTNESS OF BREATH (Patient taking differently: Inhale 2 puffs into the lungs every 6 (six) hours as needed for wheezing or shortness of breath. )   . ALPRAZolam (XANAX) 0.5 MG tablet TAKE 1 TABLET (0.5 MG TOTAL) BY MOUTH 3 (THREE) TIMES DAILY AS NEEDED FOR ANXIETY OR SLEEP. (Patient taking differently: Take 0.5 mg by mouth 2 (two) times daily as needed for anxiety or sleep. )   . aspirin EC 81 MG tablet Take 81 mg by mouth daily.   . citalopram (CELEXA) 10 MG tablet TAKE 1 TABLET BY MOUTH EVERY DAY   . fluticasone (FLONASE) 50 MCG/ACT nasal spray PLACE 1-2 SPRAYS INTO BOTH NOSTRILS DAILY AS NEEDED FOR ALLERGIES OR RHINITIS.   . HYDROcodone-acetaminophen (NORCO/VICODIN) 5-325 MG tablet Take 1 tablet by mouth every 4 (four) hours as needed for moderate pain.   [name] ibuprofen (ADVIL) 800 MG tablet Take 1 tablet (800 mg total) by mouth daily as needed (severe pain).   [name] omeprazole (PRILOSEC) 20 MG capsule Take 20 mg by mouth daily as needed (acid reflux).    . venlafaxine XR (EFFEXOR-XR) 37.5 MG 24 hr capsule Take 37.5 mg by mouth daily. (Patient not taking: Reported on  10/20/2019) 09/21/2019: Last fill was 05/13/19 for 90 day supply   No facility-administered encounter medications on file as of 12/08/2019.     Goals Addressed              This Visit's Progress   .  "I would like a living trust" (pt-stated)        [place] (see longitudinal plan of care for additional care plan information)  Current Barriers:  . Lacks knowledge of community resource: regarding [name] Work Clinical Goal(s):  [name] Over the next 90 days, patient will work with a Copywriter, advertising to address needs related to [name]  Interventions: . Inter-disciplinary care team collaboration (see longitudinal plan of care) . Patient discussed need to get her affairs in order and would like resources to complete her estate planning . Positive reinforcement provided for motivation to have end of life plans . Provided patient with list of Elder Law Attorneys that may be able to assist with completing her Will and [name] patient to contact the agency of choice to discuss estate planning  Patient Self Care Activities:  . Performs ADL's independently . Performs IADL's independently . Knowledge deficit regarding legal resources to complete Living Trust  Initial goal documentation         Follow Up Plan: SW will follow up with patient by phone over the next 7-14 business days     [name],  LCSW Clinical Social Worker  [facility] Care Management [phone]

## 2019-12-09 ENCOUNTER — Telehealth: Payer: Self-pay

## 2019-12-16 ENCOUNTER — Telehealth: Payer: Self-pay

## 2019-12-16 ENCOUNTER — Telehealth: Payer: Self-pay | Admitting: *Deleted

## 2019-12-16 NOTE — Telephone Encounter (Signed)
   Chronic Care Management   Unsuccessful Call Note 12/16/2019 Name: [name] MRN: [id number] DOB: 10-09-1945  Patient  is a 74 year old female who sees Lelon Huh, MD for primary care. Dr. [name] asked the CCM team to consult the patient for Mental Health Counseling and Resources.    This social worker was unable to reach patient via telephone today for follow up call.  Patient's voicemail was full no HIPAA compliant voicemail asking patient to return my call able to be left. (unsuccessful outreach #1).   Plan: Will follow-up within 7 business days via telephone.      Elliot Gur[place], [place] Worker  [facility] Care Management [phone]

## 2019-12-23 ENCOUNTER — Ambulatory Visit: Payer: Medicare Other | Admitting: *Deleted

## 2019-12-23 DIAGNOSIS — F419 Anxiety disorder, unspecified: Secondary | ICD-10-CM

## 2019-12-23 DIAGNOSIS — F329 Major depressive disorder, single episode, unspecified: Secondary | ICD-10-CM

## 2019-12-23 NOTE — Patient Instructions (Signed)
Thank you allowing the Chronic Care Management Team to be a part of your care! It was a pleasure speaking with you today!  1. Please call this social worker with any questions or concerns regarding your mental health needs  CCM (Chronic Care Management) Team   Neldon Labella RN, BSN Nurse Care Coordinator  [phone]  [place], LCSW Clinical Social Worker [phone]  Goals Addressed              This Visit's Progress   .  "I would like to manage my depression better" (pt-stated)        Current Barriers:  [name] Mental Health Concerns   Clinical Social Work Clinical Goal(s):  [name] Over the next 90 days, client will work with SW to address concerns related to depression Interventions: . Followed up with patient in regards to her management of stress and depression . Patient discussed recently dealing with the anniversary of her mother, uncle and brother's death . Patient discussed improvement in mood now, feeling as if her anxiety is beginning to lift . Discussed leaning on positive network of support from her family . Discussed relationship concerns and positive coping . Continued to provided patient with positive reinforcement and emotional support  . Patient has now declined follow up with a psychiatrist as previously requested by patient . Discussed plans with patient for ongoing care management follow up and provided patient with direct contact information for care management team  Patient Self Care Activities:  . Performs ADL's independently . Performs IADL's independently . Calls provider office for new concerns or questions  Please see past updates related to this goal by clicking on the "Past Updates" button in the selected goal          The patient verbalized understanding of instructions provided today and declined a print copy of patient instruction materials.   Telephone follow up appointment with care management team member scheduled for:  01/06/20

## 2019-12-23 NOTE — Chronic Care Management (AMB) (Signed)
Chronic Care Management    Clinical Social Work Follow Up Note  12/23/2019 Name: [name] MRN: [id number] DOB: [birth year]  [name] is a 74 y.o. year old female who is a primary care patient of [name], [name], MD. The CCM team was consulted for assistance with Mental Health Counseling and Resources.   Review of patient status, including review of consultants reports, other relevant assessments, and collaboration with appropriate care team members and the patient's provider was performed as part of comprehensive patient evaluation and provision of chronic care management services.    SDOH (Social Determinants of Health) assessments performed: No    Outpatient Encounter Medications as of 12/23/2019  Medication Sig Note  . albuterol (VENTOLIN HFA) 108 (90 Base) MCG/ACT inhaler TAKE 2 PUFFS BY MOUTH EVERY 6 HOURS AS NEEDED FOR WHEEZE OR SHORTNESS OF BREATH (Patient taking differently: Inhale 2 puffs into the lungs every 6 (six) hours as needed for wheezing or shortness of breath. )   . ALPRAZolam (XANAX) 0.5 MG tablet TAKE 1 TABLET (0.5 MG TOTAL) BY MOUTH 3 (THREE) TIMES DAILY AS NEEDED FOR ANXIETY OR SLEEP. (Patient taking differently: Take 0.5 mg by mouth 2 (two) times daily as needed for anxiety or sleep. )   . aspirin EC 81 MG tablet Take 81 mg by mouth daily.   . citalopram (CELEXA) 10 MG tablet TAKE 1 TABLET BY MOUTH EVERY DAY   . fluticasone (FLONASE) 50 MCG/ACT nasal spray PLACE 1-2 SPRAYS INTO BOTH NOSTRILS DAILY AS NEEDED FOR ALLERGIES OR RHINITIS.   . HYDROcodone-acetaminophen (NORCO/VICODIN) 5-325 MG tablet Take 1 tablet by mouth every 4 (four) hours as needed for moderate pain.   [name] ibuprofen (ADVIL) 800 MG tablet Take 1 tablet (800 mg total) by mouth daily as needed (severe pain).   [name] omeprazole (PRILOSEC) 20 MG capsule Take 20 mg by mouth daily as needed (acid reflux).    . venlafaxine XR (EFFEXOR-XR) 37.5 MG 24 hr capsule Take 37.5 mg by mouth daily. (Patient not  taking: Reported on 10/20/2019) 09/21/2019: Last fill was 05/13/19 for 90 day supply   No facility-administered encounter medications on file as of 12/23/2019.     Goals Addressed              This Visit's Progress   .  "I would like to manage my depression better" (pt-stated)        Current Barriers:  [name] Mental Health Concerns   Clinical Social Work Clinical Goal(s):  [name] Over the next 90 days, client will work with SW to address concerns related to depression Interventions: . Followed up with patient in regards to her management of stress and depression . Patient discussed recently dealing with the anniversary of her mother, uncle and brother's death . Patient discussed improvement in mood now, feeling as if her anxiety is beginning to lift . Discussed leaning on positive network of support from her family . Discussed relationship concerns and positive coping . Continued to provided patient with positive reinforcement and emotional support  . Patient has now declined follow up with a psychiatrist as previously requested by patient . Discussed plans with patient for ongoing care management follow up and provided patient with direct contact information for care management team  Patient Self Care Activities:  . Performs ADL's independently . Performs IADL's independently . Calls provider office for new concerns or questions  Please see past updates related to this goal by clicking on the "Past Updates" button in the selected  goal          Follow Up Plan: SW will follow up with patient by phone over the next 14 business days    [place], [place] Worker  [facility] Care Management [phone]

## 2020-01-03 ENCOUNTER — Telehealth: Payer: Self-pay

## 2020-01-03 DIAGNOSIS — H2513 Age-related nuclear cataract, bilateral: Secondary | ICD-10-CM | POA: Diagnosis not present

## 2020-01-03 DIAGNOSIS — H16042 Marginal corneal ulcer, left eye: Secondary | ICD-10-CM | POA: Diagnosis not present

## 2020-01-03 NOTE — Telephone Encounter (Signed)
Copied from [place] [id number]. Topic: General - Other >> Jan 03, 2020  8:22 AM Hinda Lenis D wrote: PT requesting a Lab appt to check her sugar levels / please advise  This is not our patient. Upon viewing pts chart. Pts PCP is Dr. [name].  Will route result note to [facility] Nurse Triage for follow up when patient returns call to clinic. Nurse may give results to patient if they return call. CRM created for this message.   KP

## 2020-01-03 NOTE — Telephone Encounter (Signed)
Called pt to let her know that her message for getting her blood sugar tested was sent to the wrong office that she needs to call her office back to schedule an appointment. Could not leave VM. Pts mailbox was full.   Will route result note to [facility] Nurse Triage for follow up when patient returns call to clinic. Nurse may give results to patient if they return call. CRM created for this message.   KP

## 2020-01-05 DIAGNOSIS — H16042 Marginal corneal ulcer, left eye: Secondary | ICD-10-CM | POA: Diagnosis not present

## 2020-01-05 DIAGNOSIS — H2513 Age-related nuclear cataract, bilateral: Secondary | ICD-10-CM | POA: Diagnosis not present

## 2020-01-06 ENCOUNTER — Ambulatory Visit: Payer: Self-pay | Admitting: *Deleted

## 2020-01-06 NOTE — Chronic Care Management (AMB) (Signed)
  Chronic Care Management   Social Work Note  01/06/2020 Name: [name] MRN: [id number] DOB: [birth year]  [name] is a 74 y.o. year old female who sees [name], [name], MD for primary care. The CCM team was consulted for assistance with Mental Health Counseling and Resources.   Phone call to patient for regular follow up call. Patient was not at home at the time of the call stating that she would call this social worker back.  SDOH (Social Determinants of Health) assessments performed: No     Outpatient Encounter Medications as of 01/06/2020  Medication Sig Note  . albuterol (VENTOLIN HFA) 108 (90 Base) MCG/ACT inhaler TAKE 2 PUFFS BY MOUTH EVERY 6 HOURS AS NEEDED FOR WHEEZE OR SHORTNESS OF BREATH (Patient taking differently: Inhale 2 puffs into the lungs every 6 (six) hours as needed for wheezing or shortness of breath. )   . ALPRAZolam (XANAX) 0.5 MG tablet TAKE 1 TABLET (0.5 MG TOTAL) BY MOUTH 3 (THREE) TIMES DAILY AS NEEDED FOR ANXIETY OR SLEEP. (Patient taking differently: Take 0.5 mg by mouth 2 (two) times daily as needed for anxiety or sleep. )   . aspirin EC 81 MG tablet Take 81 mg by mouth daily.   . citalopram (CELEXA) 10 MG tablet TAKE 1 TABLET BY MOUTH EVERY DAY   . fluticasone (FLONASE) 50 MCG/ACT nasal spray PLACE 1-2 SPRAYS INTO BOTH NOSTRILS DAILY AS NEEDED FOR ALLERGIES OR RHINITIS.   . HYDROcodone-acetaminophen (NORCO/VICODIN) 5-325 MG tablet Take 1 tablet by mouth every 4 (four) hours as needed for moderate pain.   [name] ibuprofen (ADVIL) 800 MG tablet Take 1 tablet (800 mg total) by mouth daily as needed (severe pain).   [name] omeprazole (PRILOSEC) 20 MG capsule Take 20 mg by mouth daily as needed (acid reflux).    . venlafaxine XR (EFFEXOR-XR) 37.5 MG 24 hr capsule Take 37.5 mg by mouth daily. (Patient not taking: Reported on 10/20/2019) 09/21/2019: Last fill was 05/13/19 for 90 day supply   No facility-administered encounter medications on file as of 01/06/2020.     Goals Addressed   None     Follow Up Plan: SW will follow up with patient by phone over the next 5-7 business days   [place], [place] Worker  [facility] Care Management [phone]

## 2020-01-11 ENCOUNTER — Ambulatory Visit: Payer: Self-pay | Admitting: *Deleted

## 2020-01-11 DIAGNOSIS — F329 Major depressive disorder, single episode, unspecified: Secondary | ICD-10-CM

## 2020-01-11 DIAGNOSIS — F419 Anxiety disorder, unspecified: Secondary | ICD-10-CM

## 2020-01-11 NOTE — Chronic Care Management (AMB) (Signed)
Chronic Care Management    Clinical Social Work Follow Up Note  01/11/2020 Name: [name] MRN: [id number] DOB: 1945-05-06  [name] is a 74 y.o. year old female who is a primary care patient of [name], Wanda Peri, MD. The CCM team was consulted for assistance with Mental Health Counseling and Resources.   Review of patient status, including review of consultants reports, other relevant assessments, and collaboration with appropriate care team members and the patient's provider was performed as part of comprehensive patient evaluation and provision of chronic care management services.    SDOH (Social Determinants of Health) assessments performed: No    Outpatient Encounter Medications as of 01/11/2020  Medication Sig Note  . albuterol (VENTOLIN HFA) 108 (90 Base) MCG/ACT inhaler TAKE 2 PUFFS BY MOUTH EVERY 6 HOURS AS NEEDED FOR WHEEZE OR SHORTNESS OF BREATH (Patient taking differently: Inhale 2 puffs into the lungs every 6 (six) hours as needed for wheezing or shortness of breath. )   . ALPRAZolam (XANAX) 0.5 MG tablet TAKE 1 TABLET (0.5 MG TOTAL) BY MOUTH 3 (THREE) TIMES DAILY AS NEEDED FOR ANXIETY OR SLEEP. (Patient taking differently: Take 0.5 mg by mouth 2 (two) times daily as needed for anxiety or sleep. )   . aspirin EC 81 MG tablet Take 81 mg by mouth daily.   . citalopram (CELEXA) 10 MG tablet TAKE 1 TABLET BY MOUTH EVERY DAY   . fluticasone (FLONASE) 50 MCG/ACT nasal spray PLACE 1-2 SPRAYS INTO BOTH NOSTRILS DAILY AS NEEDED FOR ALLERGIES OR RHINITIS.   . HYDROcodone-acetaminophen (NORCO/VICODIN) 5-325 MG tablet Take 1 tablet by mouth every 4 (four) hours as needed for moderate pain.   [name] ibuprofen (ADVIL) 800 MG tablet Take 1 tablet (800 mg total) by mouth daily as needed (severe pain).   [name] omeprazole (PRILOSEC) 20 MG capsule Take 20 mg by mouth daily as needed (acid reflux).    . venlafaxine XR (EFFEXOR-XR) 37.5 MG 24 hr capsule Take 37.5 mg by mouth daily. (Patient not  taking: Reported on 10/20/2019) 09/21/2019: Last fill was 05/13/19 for 90 day supply   No facility-administered encounter medications on file as of 01/11/2020.     Goals Addressed              This Visit's Progress   .  "I would like a living trust" (pt-stated)        [name] (see longitudinal plan of care for additional care plan information)  Current Barriers:  . Lacks knowledge of community resource: regarding [name] Work Clinical Goal(s):  [name] Over the next 90 days, patient will work with a Copywriter, advertising to address needs related to [name]  Interventions: . Inter-disciplinary care team collaboration (see longitudinal plan of care) . Patient discussed continued need to get her affairs in order and would like resources to complete her estate planning . Positive reinforcement provided for motivation to have end of life plans . Patient confirmed that she has been in contact with [name] Attorney's and is currently working on arranging her finances to pay for the needed estate planning   Patient Self Care Activities:  . Performs ADL's independently . Performs IADL's independently . Knowledge deficit regarding legal resources to complete Living Trust  Initial goal documentation     .  "I would like to manage my depression better" (pt-stated)        Current Barriers:  [name] Mental Health Concerns   Clinical Social Work  Clinical Goal(s):  [name] Over the next 90 days, client will work with SW to address concerns related to depression Interventions: . Followed up with patient in regards to her management of stress and depression . Patient discussed improvement in mood now, feeling as if her anxiety is becoming more manageable . Discussed leaning on positive network of support from her family . Discussed relationship concerns, boundary setting and positive coping . Continued to provided patient with positive reinforcement and emotional support   . Patient has now declined follow up with a psychiatrist as previously requested by patient . Discussed plans with patient for ongoing care management follow up and provided patient with direct contact information for care management team  Patient Self Care Activities:  . Performs ADL's independently . Performs IADL's independently . Calls provider office for new concerns or questions  Please see past updates related to this goal by clicking on the "Past Updates" button in the selected goal          Follow Up Plan: SW will follow up with patient by phone over the next 7-14 business days    [place], [place] Worker  [facility] Care Management [phone]

## 2020-01-11 NOTE — Patient Instructions (Addendum)
Thank you allowing the Chronic Care Management Team to be a part of your care! It was a pleasure speaking with you today!  1. Please call this social worker with any questions or concerns regarding your mental health needs  CCM (Chronic Care Management) Team   Neldon Labella RN, BSN Nurse Care Coordinator  [phone]  [place], LCSW Clinical Social Worker [phone]  Goals Addressed              This Visit's Progress   .  "I would like a living trust" (pt-stated)        [place] (see longitudinal plan of care for additional care plan information)  Current Barriers:  . Lacks knowledge of community resource: regarding [place] Work Clinical Goal(s):  [name] Over the next 90 days, patient will work with a Copywriter, advertising to address needs related to [name]  Interventions: . Inter-disciplinary care team collaboration (see longitudinal plan of care) . Patient discussed continued need to get her affairs in order and would like resources to complete her estate planning . Positive reinforcement provided for motivation to have end of life plans . Patient confirmed that she has been in contact with Elder Law Attorney's and is currently working on arranging her finances to pay for the needed estate planning   Patient Self Care Activities:  . Performs ADL's independently . Performs IADL's independently . Knowledge deficit regarding legal resources to complete Living Trust  Initial goal documentation     .  "I would like to manage my depression better" (pt-stated)        Current Barriers:  [name] Mental Health Concerns   Clinical Social Work Clinical Goal(s):  [name] Over the next 90 days, client will work with SW to address concerns related to depression Interventions: . Followed up with patient in regards to her management of stress and depression . Patient discussed improvement in mood now, feeling as if her anxiety is becoming more  manageable . Discussed leaning on positive network of support from her family . Discussed relationship concerns, boundary setting and positive coping . Continued to provided patient with positive reinforcement and emotional support  . Patient has now declined follow up with a psychiatrist as previously requested by patient . Discussed plans with patient for ongoing care management follow up and provided patient with direct contact information for care management team  Patient Self Care Activities:  . Performs ADL's independently . Performs IADL's independently . Calls provider office for new concerns or questions  Please see past updates related to this goal by clicking on the "Past Updates" button in the selected goal          The patient verbalized understanding of instructions, educational materials, and care plan provided today and declined offer to receive copy of patient instructions, educational materials, and care plan.   Telephone follow up appointment with care management team member scheduled for:01/27/20

## 2020-01-12 ENCOUNTER — Other Ambulatory Visit: Payer: Self-pay | Admitting: Family Medicine

## 2020-01-12 NOTE — Telephone Encounter (Signed)
Requested medication (s) are due for refill today:  Yes  Requested medication (s) are on the active medication list:  Yes  Future visit scheduled:  No  Last Refill: 12/30/18; #90; RF x 2  Notes to clinic:  tried to contact pt.; left vm to call office and sched. F/u appt.  Last seen in 12/2018.   Requested Prescriptions  Pending Prescriptions Disp Refills   citalopram (CELEXA) 10 MG tablet [Pharmacy Med Name: CITALOPRAM HBR 10 MG TABLET] 90 tablet 2    Sig: TAKE 1 TABLET BY MOUTH EVERY DAY      Psychiatry:  Antidepressants - SSRI Failed - 01/12/2020  9:02 AM      Failed - Valid encounter within last 6 months    Recent Outpatient Visits           1 year ago Elevated blood pressure reading   [facility] Birdie Sons, MD   1 year ago Major depressive disorder with single episode, remission status unspecified   [facility] Birdie Sons, MD   1 year ago Gregory, Donald E, MD   1 year ago Vitamin D deficiency   [facility] Birdie Sons, MD   1 year ago Acute recurrent pansinusitis   [facility] Loda, Dionne Bucy, MD              Passed - Completed PHQ-2 or PHQ-9 in the last 360 days

## 2020-01-17 ENCOUNTER — Encounter: Payer: Self-pay | Admitting: General Surgery

## 2020-01-21 ENCOUNTER — Telehealth: Payer: Self-pay

## 2020-01-21 NOTE — Telephone Encounter (Signed)
Patient called, left VM to return the call to the office. 

## 2020-01-21 NOTE — Telephone Encounter (Signed)
Copied from [place] [phone]. Topic: Appointment Scheduling - Scheduling Inquiry for Clinic >> Jan 21, 2020 10:44 AM Alanda Slim E wrote: Reason for CRM: Pt would like to schedule a lab appt and have orders to check her sugar/ please advise

## 2020-01-21 NOTE — Telephone Encounter (Signed)
Pt is due for an OV.  Left message to call back. Ok for [facility] to schedule if pt calls back.

## 2020-01-24 NOTE — Telephone Encounter (Signed)
Unable to contact pt. Will try again later.

## 2020-01-25 ENCOUNTER — Ambulatory Visit (INDEPENDENT_AMBULATORY_CARE_PROVIDER_SITE_OTHER): Payer: Medicare Other | Admitting: Family Medicine

## 2020-01-25 ENCOUNTER — Encounter: Payer: Self-pay | Admitting: Family Medicine

## 2020-01-25 ENCOUNTER — Other Ambulatory Visit: Payer: Self-pay

## 2020-01-25 VITALS — BP 135/88 | HR 70 | Temp 98.3°F | Resp 16 | Wt 180.0 lb

## 2020-01-25 DIAGNOSIS — R3915 Urgency of urination: Secondary | ICD-10-CM

## 2020-01-25 DIAGNOSIS — R739 Hyperglycemia, unspecified: Secondary | ICD-10-CM

## 2020-01-25 DIAGNOSIS — F329 Major depressive disorder, single episode, unspecified: Secondary | ICD-10-CM

## 2020-01-25 DIAGNOSIS — R319 Hematuria, unspecified: Secondary | ICD-10-CM | POA: Diagnosis not present

## 2020-01-25 DIAGNOSIS — F410 Panic disorder [episodic paroxysmal anxiety] without agoraphobia: Secondary | ICD-10-CM

## 2020-01-25 LAB — POCT GLYCOSYLATED HEMOGLOBIN (HGB A1C)
Est. average glucose Bld gHb Est-mCnc: 111
Hemoglobin A1C: 5.5 % (ref 4.0–5.6)

## 2020-01-25 LAB — POCT URINALYSIS DIPSTICK
Bilirubin, UA: NEGATIVE
Glucose, UA: NEGATIVE
Ketones, UA: NEGATIVE
Nitrite, UA: NEGATIVE
Protein, UA: NEGATIVE
Spec Grav, UA: 1.015 (ref 1.010–1.025)
Urobilinogen, UA: 0.2 E.U./dL
pH, UA: 7 (ref 5.0–8.0)

## 2020-01-25 NOTE — Progress Notes (Signed)
Established patient visit   Patient: [name]   DOB: [birth year]   74 y.o. Female  MRN: [id number] Visit Date: 01/25/2020  Today's healthcare provider: Lelon Huh, MD   Chief Complaint  Patient presents with  . Urinary Urgency   Subjective    HPI  Depression, Follow-up  She  was last seen for this 10/05/2018. Changes made at last visit include none; continue same medication.   She reports good compliance with treatment. She is not having side effects.    She reports good tolerance of treatment. Current symptoms include: depressed mood She feels she is Worse since last visit.  Depression screen [facility] 2/9 01/25/2020 09/30/2019 01/01/2019  Decreased Interest 3 0 0  Down, Depressed, Hopeless 3 0 0  PHQ - 2 Score 6 0 0  Altered sleeping 0 - 0  Tired, decreased energy 3 - 2  Change in appetite 3 - 0  Feeling bad or failure about yourself  2 - 0  Trouble concentrating 2 - 0  Moving slowly or fidgety/restless 0 - 0  Suicidal thoughts 1 - 0  PHQ-9 Score 17 - 2  Difficult doing work/chores Extremely dIfficult - Not difficult at all    -----------------------------------------------------------------------------------------   Diabetes screening:  Patient is concerned about diabetes. She has has symptoms of excessive thirst, urinary urgency and an increased craving for sweets.   Medications: Outpatient Medications Prior to Visit  Medication Sig  . albuterol (VENTOLIN HFA) 108 (90 Base) MCG/ACT inhaler TAKE 2 PUFFS BY MOUTH EVERY 6 HOURS AS NEEDED FOR WHEEZE OR SHORTNESS OF BREATH (Patient taking differently: Inhale 2 puffs into the lungs every 6 (six) hours as needed for wheezing or shortness of breath. )  . ALPRAZolam (XANAX) 0.5 MG tablet TAKE 1 TABLET (0.5 MG TOTAL) BY MOUTH 3 (THREE) TIMES DAILY AS NEEDED FOR ANXIETY OR SLEEP. (Patient taking differently: Take 0.5 mg by mouth 2 (two) times daily as needed for anxiety or sleep. )  . aspirin EC 81 MG tablet Take 81  mg by mouth daily.  . citalopram (CELEXA) 10 MG tablet Take 1 tablet (10 mg total) by mouth daily.  . fluticasone (FLONASE) 50 MCG/ACT nasal spray PLACE 1-2 SPRAYS INTO BOTH NOSTRILS DAILY AS NEEDED FOR ALLERGIES OR RHINITIS.  . HYDROcodone-acetaminophen (NORCO/VICODIN) 5-325 MG tablet Take 1 tablet by mouth every 4 (four) hours as needed for moderate pain.  [name] ibuprofen (ADVIL) 800 MG tablet Take 1 tablet (800 mg total) by mouth daily as needed (severe pain).  [name] omeprazole (PRILOSEC) 20 MG capsule Take 20 mg by mouth daily as needed (acid reflux).   . venlafaxine XR (EFFEXOR-XR) 37.5 MG 24 hr capsule Take 37.5 mg by mouth daily. (Patient not taking: Reported on 10/20/2019)   No facility-administered medications prior to visit.    Review of Systems  Constitutional: Negative for appetite change, chills, fatigue and fever.  Respiratory: Negative for chest tightness and shortness of breath.   Cardiovascular: Negative for chest pain and palpitations.  Gastrointestinal: Negative for abdominal pain, nausea and vomiting.  Endocrine: Positive for polydipsia, polyphagia and polyuria.  Neurological: Negative for dizziness and weakness.     Objective    BP 135/88 (BP Location: Right Arm, Patient Position: Sitting, Cuff Size: Normal)   Pulse 70   Temp 98.3 F (36.8 C) (Oral)   Resp 16   Wt 180 lb (81.6 kg)   BMI 31.89 kg/m   Physical Exam  General appearance: Obese female, cooperative and in no  acute distress Head: Normocephalic, without obvious abnormality, atraumatic Respiratory: Respirations even and unlabored, normal respiratory rate Extremities: All extremities are intact.  Skin: Skin color, texture, turgor normal. No rashes seen  Psych: Appropriate mood and affect. Neurologic: Mental status: Alert, oriented to person, place, and time, thought content appropriate.   Results for orders placed or performed in visit on 01/25/20  POCT HgB A1C  Result Value Ref Range   Hemoglobin A1C 5.5  4.0 - 5.6 %   Est. average glucose Bld gHb Est-mCnc 111   POCT Urinalysis Dipstick  Result Value Ref Range   Color, UA yellow    Clarity, UA clear    Glucose, UA Negative Negative   Bilirubin, UA negative    Ketones, UA negative    Spec Grav, UA 1.015 1.010 - 1.025   Blood, UA Moderate (non-hemolyzed)    pH, UA 7.0 5.0 - 8.0   Protein, UA Negative Negative   Urobilinogen, UA 0.2 0.2 or 1.0 E.U./dL   Nitrite, UA negative    Leukocytes, UA Trace (A) Negative   Appearance     Odor      Assessment & Plan     1. Urinary urgency  - Urine Culture - Urinalysis, microscopic only  2. Hematuria, unspecified type  - Urine Culture - Urinalysis, microscopic only  3. Hyperglycemia Normal A1c today. Reassured that she does not have diabetes.   4. Major depressive disorder with single episode, remission status unspecified She continues on citalopram but reports she continues to feel depressed with little motivation, sometimes having near panic attacks when she goes out shopping. Recommend titrating up on citalopram but she states she really wants to get off of medication. But she admits that she probably couldn't function off of citalopram so we agreed to leave dose the same for now. Advised to let me is she decides to titrate the dose up.   5. Panic disorder Continue citalopram.         The entirety of the information documented in the History of Present Illness, Review of Systems and Physical Exam were personally obtained by me. Portions of this information were initially documented by the CMA and reviewed by me for thoroughness and accuracy.      Lelon Huh, MD  [facility] [phone] (phone) [phone] (fax)  [place]

## 2020-01-26 LAB — URINALYSIS, MICROSCOPIC ONLY
Bacteria, UA: NONE SEEN
Casts: NONE SEEN /lpf

## 2020-01-27 ENCOUNTER — Encounter: Payer: Self-pay | Admitting: *Deleted

## 2020-01-27 ENCOUNTER — Ambulatory Visit: Payer: Self-pay | Admitting: *Deleted

## 2020-01-27 DIAGNOSIS — F329 Major depressive disorder, single episode, unspecified: Secondary | ICD-10-CM

## 2020-01-27 DIAGNOSIS — F419 Anxiety disorder, unspecified: Secondary | ICD-10-CM

## 2020-01-27 NOTE — Progress Notes (Signed)
This encounter was created in error - please disregard.

## 2020-01-28 ENCOUNTER — Telehealth: Payer: Self-pay | Admitting: Family Medicine

## 2020-01-28 ENCOUNTER — Telehealth: Payer: Self-pay

## 2020-01-28 LAB — URINE CULTURE

## 2020-01-28 NOTE — Patient Instructions (Addendum)
Thank you allowing the Chronic Care Management Team to be a part of your care! It was a pleasure speaking with you today!  1. Please call this social worker with any questions or concerns regarding your mental health needs  CCM (Chronic Care Management) Team   Neldon Labella RN, BSN Nurse Care Coordinator  [phone]  [place], LCSW Clinical Social Worker [phone]  Goals Addressed              This Visit's Progress   .  "I would like to manage my depression better" (pt-stated)        Current Barriers:  [name] Mental Health Concerns   Clinical Social Work Clinical Goal(s):  [name] Over the next 90 days, client will work with SW to address concerns related to depression Interventions: . Followed up with patient in regards to her management of stress and depression . Patient continues to discuss improvement in mood now, discussing some set backs due to the holidays . Continued to discuss leaning on positive network of support from her family . Discussed relationship concerns, boundary setting and positive coping . Continued to provided patient with positive reinforcement and emotional support  . Discussed plans with patient for ongoing care management follow up and provided patient with direct contact information for care management team  Patient Self Care Activities:  . Performs ADL's independently . Performs IADL's independently . Calls provider office for new concerns or questions  Please see past updates related to this goal by clicking on the "Past Updates" button in the selected goal          The patient verbalized understanding of instructions, educational materials, and care plan provided today and declined offer to receive copy of patient instructions, educational materials, and care plan.   Telephone follow up appointment with care management team member scheduled for:  02/15/20

## 2020-01-28 NOTE — Telephone Encounter (Signed)
Copied from [place] [phone]. Topic: General - Other >> Jan 28, 2020  8:20 AM Keene Breath wrote: Reason for CRM: Patient called to get her blood test results.  She stated she took it 3 days ago and have not heard anything yet.  Please call to discuss at [phone]

## 2020-01-28 NOTE — Chronic Care Management (AMB) (Signed)
  Chronic Care Management    Clinical Social Work Follow Up Note  01/28/2020 Name: [name] MRN: [id number] DOB: 10/23/1945  [name] is a 74 y.o. year old female who is a primary care patient of [name], [name], MD. The CCM team was consulted for assistance with Mental Health Counseling and Resources.   Review of patient status, including review of consultants reports, other relevant assessments, and collaboration with appropriate care team members and the patient's provider was performed as part of comprehensive patient evaluation and provision of chronic care management services.    SDOH (Social Determinants of Health) assessments performed: No    Outpatient Encounter Medications as of 01/27/2020  Medication Sig  . albuterol (VENTOLIN HFA) 108 (90 Base) MCG/ACT inhaler TAKE 2 PUFFS BY MOUTH EVERY 6 HOURS AS NEEDED FOR WHEEZE OR SHORTNESS OF BREATH (Patient taking differently: Inhale 2 puffs into the lungs every 6 (six) hours as needed for wheezing or shortness of breath. )  . ALPRAZolam (XANAX) 0.5 MG tablet TAKE 1 TABLET (0.5 MG TOTAL) BY MOUTH 3 (THREE) TIMES DAILY AS NEEDED FOR ANXIETY OR SLEEP. (Patient taking differently: Take 0.5 mg by mouth 2 (two) times daily as needed for anxiety or sleep. )  . aspirin EC 81 MG tablet Take 81 mg by mouth daily.  . citalopram (CELEXA) 10 MG tablet Take 1 tablet (10 mg total) by mouth daily.  . fluticasone (FLONASE) 50 MCG/ACT nasal spray PLACE 1-2 SPRAYS INTO BOTH NOSTRILS DAILY AS NEEDED FOR ALLERGIES OR RHINITIS.  . HYDROcodone-acetaminophen (NORCO/VICODIN) 5-325 MG tablet Take 1 tablet by mouth every 4 (four) hours as needed for moderate pain.  [name] ibuprofen (ADVIL) 800 MG tablet Take 1 tablet (800 mg total) by mouth daily as needed (severe pain).  [name] omeprazole (PRILOSEC) 20 MG capsule Take 20 mg by mouth daily as needed (acid reflux).    No facility-administered encounter medications on file as of 01/27/2020.     Goals Addressed               This Visit's Progress   .  "I would like to manage my depression better" (pt-stated)        Current Barriers:  [name] Mental Health Concerns   Clinical Social Work Clinical Goal(s):  [name] Over the next 90 days, client will work with SW to address concerns related to depression Interventions: . Followed up with patient in regards to her management of stress and depression . Patient continues to discuss improvement in mood now, discussing some set backs due to the holidays . Continued to discuss leaning on positive network of support from her family . Discussed relationship concerns, boundary setting and positive coping . Continued to provided patient with positive reinforcement and emotional support  . Discussed plans with patient for ongoing care management follow up and provided patient with direct contact information for care management team  Patient Self Care Activities:  . Performs ADL's independently . Performs IADL's independently . Calls provider office for new concerns or questions  Please see past updates related to this goal by clicking on the "Past Updates" button in the selected goal          Follow Up Plan: SW will follow up with patient by phone over the next 30 business days    [place], [place] Worker  [facility] Care Management [phone]

## 2020-01-28 NOTE — Telephone Encounter (Signed)
Patient requesting citalopram (CELEXA) 10 MG tablet . Pharmacist advised patient refill is not due until 2020/02/11. Due to death in patient family requesting refill early,please advise   [id number] - [place], [place] - [address] Phone:  [phone]  Fax:  [phone]

## 2020-01-28 NOTE — Telephone Encounter (Signed)
Advised patient of results.  

## 2020-01-31 MED ORDER — CITALOPRAM HYDROBROMIDE 10 MG PO TABS
10.0000 mg | ORAL_TABLET | Freq: Every day | ORAL | 4 refills | Status: DC
Start: 2020-01-31 — End: 2020-05-12

## 2020-02-15 ENCOUNTER — Ambulatory Visit: Payer: Self-pay | Admitting: *Deleted

## 2020-02-15 NOTE — Chronic Care Management (AMB) (Signed)
°  Chronic Care Management   Social Work Note  02/15/2020 Name: [name] MRN: [id number] DOB: August 29, 1945  [name] is a 74 y.o. year old female who sees [name], [name], MD for primary care. The CCM team was consulted for assistance with Mental Health Counseling and Resources   Follow up phone call to patient. Patient confirmed depressed mood over the holidays, however was not able to talk at this time. Phone call re-scheduled for 02/16/20.  SDOH (Social Determinants of Health) assessments performed: No     Outpatient Encounter Medications as of 02/15/2020  Medication Sig   albuterol (VENTOLIN HFA) 108 (90 Base) MCG/ACT inhaler TAKE 2 PUFFS BY MOUTH EVERY 6 HOURS AS NEEDED FOR WHEEZE OR SHORTNESS OF BREATH (Patient taking differently: Inhale 2 puffs into the lungs every 6 (six) hours as needed for wheezing or shortness of breath. )   ALPRAZolam (XANAX) 0.5 MG tablet TAKE 1 TABLET (0.5 MG TOTAL) BY MOUTH 3 (THREE) TIMES DAILY AS NEEDED FOR ANXIETY OR SLEEP. (Patient taking differently: Take 0.5 mg by mouth 2 (two) times daily as needed for anxiety or sleep. )   aspirin EC 81 MG tablet Take 81 mg by mouth daily.   citalopram (CELEXA) 10 MG tablet Take 1 tablet (10 mg total) by mouth daily.   fluticasone (FLONASE) 50 MCG/ACT nasal spray PLACE 1-2 SPRAYS INTO BOTH NOSTRILS DAILY AS NEEDED FOR ALLERGIES OR RHINITIS.   HYDROcodone-acetaminophen (NORCO/VICODIN) 5-325 MG tablet Take 1 tablet by mouth every 4 (four) hours as needed for moderate pain.   ibuprofen (ADVIL) 800 MG tablet Take 1 tablet (800 mg total) by mouth daily as needed (severe pain).   omeprazole (PRILOSEC) 20 MG capsule Take 20 mg by mouth daily as needed (acid reflux).    No facility-administered encounter medications on file as of 02/15/2020.    Goals Addressed   None     Follow Up Plan: Appointment scheduled for SW follow up with client by phone on: 02/16/20   Verna Czech, LCSW Clinical Social  Worker  [facility] Care Management [phone]

## 2020-02-16 ENCOUNTER — Ambulatory Visit: Payer: Self-pay | Admitting: *Deleted

## 2020-02-16 DIAGNOSIS — F329 Major depressive disorder, single episode, unspecified: Secondary | ICD-10-CM

## 2020-02-16 DIAGNOSIS — F419 Anxiety disorder, unspecified: Secondary | ICD-10-CM

## 2020-02-16 NOTE — Chronic Care Management (AMB) (Signed)
Chronic Care Management    Clinical Social Work General Note  02/16/2020 Name: [name] MRN: [id number] DOB: February 15, 1946  [name] is a 74 y.o. year old female who is a primary care patient of [name], [name], MD. The CCM team was consulted to assist the patient with chronic disease management and/or care coordination needs related to: Mental Health Counseling and Resources.   Engaged with patient by telephone for follow up visit in response to provider referral for social work chronic care management and care coordination services.   Consent to Services:  [name] was given information about Chronic Care Management services, agreed to services, and gave verbal consent prior to initiation of services on 08/28/2018. Please see initial visit note for detailed documentation.   Patient agreed to services and consent obtained.   Assessment/Interventions: Review of patient past medical history, allergies, medications, and health status, including review of relevant consultants reports was performed today as part of a comprehensive evaluation and provision of chronic care management and care coordination services.     SDOH (Social Determinants of Health) assessments and interventions performed:  NO  Advanced Directives Status: See Vyn[employer] application for related entries.  CCM Care Plan  Allergies  Allergen Reactions   Levofloxacin     Tongue and mouth swelling   Calcium Channel Blockers     Other reaction(s): Dizziness   Amoxicillin Other (See Comments)    Causes yeast infection Has patient had a PCN reaction causing immediate rash, facial/tongue/throat swelling, SOB or lightheadedness with hypotension: No Has patient had a PCN reaction causing severe rash involving mucus membranes or skin necrosis: No Has patient had a PCN reaction that required hospitalization: No Has patient had a PCN reaction occurring within the last 10 years: No If all of the above answers are "NO",  then may proceed with Cephalosporin use.    Nickel Rash   Penicillins Other (See Comments)    Causes yeast infection Has patient had a PCN reaction causing immediate rash, facial/tongue/throat swelling, SOB or lightheadedness with hypotension: No Has patient had a PCN reaction causing severe rash involving mucus membranes or skin necrosis: No Has patient had a PCN reaction that required hospitalization: No Has patient had a PCN reaction occurring within the last 10 years: No If all of the above answers are "NO", then may proceed with Cephalosporin use.    Outpatient Encounter Medications as of 02/16/2020  Medication Sig   albuterol (VENTOLIN HFA) 108 (90 Base) MCG/ACT inhaler TAKE 2 PUFFS BY MOUTH EVERY 6 HOURS AS NEEDED FOR WHEEZE OR SHORTNESS OF BREATH (Patient taking differently: Inhale 2 puffs into the lungs every 6 (six) hours as needed for wheezing or shortness of breath. )   ALPRAZolam (XANAX) 0.5 MG tablet TAKE 1 TABLET (0.5 MG TOTAL) BY MOUTH 3 (THREE) TIMES DAILY AS NEEDED FOR ANXIETY OR SLEEP. (Patient taking differently: Take 0.5 mg by mouth 2 (two) times daily as needed for anxiety or sleep. )   aspirin EC 81 MG tablet Take 81 mg by mouth daily.   citalopram (CELEXA) 10 MG tablet Take 1 tablet (10 mg total) by mouth daily.   fluticasone (FLONASE) 50 MCG/ACT nasal spray PLACE 1-2 SPRAYS INTO BOTH NOSTRILS DAILY AS NEEDED FOR ALLERGIES OR RHINITIS.   HYDROcodone-acetaminophen (NORCO/VICODIN) 5-325 MG tablet Take 1 tablet by mouth every 4 (four) hours as needed for moderate pain.   ibuprofen (ADVIL) 800 MG tablet Take 1 tablet (800 mg total) by mouth daily as needed (severe  pain).   omeprazole (PRILOSEC) 20 MG capsule Take 20 mg by mouth daily as needed (acid reflux).    No facility-administered encounter medications on file as of 02/16/2020.    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   Acid reflux 10/20/2014   Hx of colonic polyps    Benign neoplasm of transverse colon    Diverticulosis of large intestine without diverticulitis    Allergic rhinitis 08/01/2014   Anxiety 08/01/2014   HLD (hyperlipidemia) 08/01/2014   BP (high blood pressure) 08/01/2014   Vitamin D deficiency 08/01/2014   H/O adenomatous polyp of colon 01/22/2012    Conditions to be addressed/monitored per PCP order: ; Mental Health Concerns   Patient Care Plan: Depression (Adult)    Problem Identified: Depression Identification (Depression)     Long-Range Goal: Depressive Symptoms Identified   Start Date: 02/16/2020  Expected End Date: 08/16/2020  This Visit's Progress: On track  Priority: High  Note:   Current Barriers:   Mental Health Concerns   Difficulty   Clinical Social Work Clinical Goal(s):   Over the next 90 days, patient will work with SW to address concerns related to identifying coping skills to manager her depression as well as connection her to a local mental health provider for ongoing mental health follow up   Interventions:  1:1 collaboration with [name], [name], MD regarding development and update of comprehensive plan of care as evidenced by provider attestation and co-signature  Inter-disciplinary care team collaboration (see longitudinal plan of care)  Patient interviewed and appropriate assessments performed  Explored patient's feelings regarding the holidays and provided supportive counseling related to the sudden loss of a family friend  Coping strategies explored  Provided patient with information about shifting her perspective and prioritizing self care during  this time  Patient Goals/Self-Care Activities Over the next 90 days, patient will:   - Patient will attend all scheduled provider appointments Patient will attend church or other social activities - consider beginning personal counseling - talk about feelings with a friend, family or spiritual advisor - practice positive thinking and self-talk   Follow up Plan: SW will follow up with patient by phone over the next 14 business days     Task: Identify Depressive Symptoms and Facilitate Treatment   Due Date: 03/01/2020  Priority: Routine  Note:   Care Management Activities:    - mental health treatment to be arranged with patient's consent   Notes:       Follow Up Plan: SW will follow up with patient by phone over the next 14 business days       [place], [place] Worker  [facility] Management [phone]

## 2020-02-16 NOTE — Patient Instructions (Addendum)
Visit Information  Patient Care Plan: Depression (Adult)    Problem Identified: Depression Identification (Depression)     Long-Range Goal: Depressive Symptoms Identified   Start Date: 02/16/2020  Expected End Date: 08/16/2020  This Visit's Progress: On track  Priority: High  Note:   Current Barriers:  [name] Mental Health Concerns  . Difficulty   Clinical Social Work Clinical Goal(s):  [name] Over the next 90 days, patient will work with SW to address concerns related to identifying coping skills to manager her depression as well as connection her to a local mental health provider for ongoing mental health follow up   Interventions: . 1:1 collaboration with Sherrie Mustache, [name], MD regarding development and update of comprehensive plan of care as evidenced by provider attestation and co-signature . Inter-disciplinary care team collaboration (see longitudinal plan of care) . Patient interviewed and appropriate assessments performed . Explored patient's feelings regarding the holidays and provided supportive counseling related to the sudden loss of a family friend . Coping strategies explored . Provided patient with information about shifting her perspective and prioritizing self care during this time  Patient Goals/Self-Care Activities Over the next 90 days, patient will:   - Patient will attend all scheduled provider appointments Patient will attend church or other social activities - consider beginning personal counseling - talk about feelings with a friend, family or spiritual advisor - practice positive thinking and self-talk   Follow up Plan: SW will follow up with patient by phone over the next 14 business days     Task: Identify Depressive Symptoms and Facilitate Treatment   Due Date: 03/01/2020  Priority: Routine  Note:   Care Management Activities:    - mental health treatment to be arranged with patient's consent   Notes:      The patient verbalized understanding of  instructions, educational materials, and care plan provided today and declined offer to receive copy of patient instructions, educational materials, and care plan.   Telephone follow up appointment with care management team member scheduled for:  03/03/19   . Verna Czech, LCSW Clinical Social Worker  [facility] [phone]

## 2020-02-22 ENCOUNTER — Other Ambulatory Visit: Payer: Self-pay | Admitting: Family Medicine

## 2020-03-02 ENCOUNTER — Telehealth: Payer: Self-pay

## 2020-03-03 ENCOUNTER — Ambulatory Visit: Payer: Self-pay | Admitting: *Deleted

## 2020-03-03 DIAGNOSIS — F329 Major depressive disorder, single episode, unspecified: Secondary | ICD-10-CM

## 2020-03-03 DIAGNOSIS — F419 Anxiety disorder, unspecified: Secondary | ICD-10-CM

## 2020-03-03 NOTE — Patient Instructions (Addendum)
Visit Information  Patient Care Plan: Depression (Adult)    Problem Identified: Depression Identification (Depression)     Long-Range Goal: Depressive Symptoms Identified   Start Date: 02/16/2020  Expected End Date: 08/16/2020  Recent Progress: On track  Priority: High  Note:   Current Barriers:  [name] Mental Health Concerns  . Difficulty   Clinical Social Work Clinical Goal(s):  [name] Over the next 90 days, patient will work with SW to address concerns related to identifying coping skills to manager her depression as well as connection her to a local mental health provider for ongoing mental health follow up   Interventions: . 1:1 collaboration with [name], [name], MD regarding development and update of comprehensive plan of care as evidenced by provider attestation and co-signature . Inter-disciplinary care team collaboration (see longitudinal plan of care) . Patient interviewed and appropriate assessments performed . Explored patient's feelings regarding the loss of her brother and family friend . Patient reports grief tends to come and go at different times . Patient's grief reactions normalized, coping strategies explored  . Explored follow up with a therapist that specializes in anxiety-patient declines at this time . Provided patient with information about shifting her perspective and prioritizing self care during this time  Patient Goals/Self-Care Activities Over the next 90 days, patient will:   - Patient will attend all scheduled provider appointments Patient will attend church or other social activities - consider beginning personal counseling - talk about feelings with a friend, family or spiritual advisor - practice positive thinking and self-talk   Follow up Plan: SW will follow up with patient by phone over the next 14 business days     Task: Identify Depressive Symptoms and Facilitate Treatment Completed 03/03/2020  Due Date: 03/01/2020  Priority: Routine  Note:    Care Management Activities:    - mental health treatment to be arranged with patient's consent   Notes: Patient declines at this time     The patient verbalized understanding of instructions, educational materials, and care plan provided today and declined offer to receive copy of patient instructions, educational materials, and care plan.   Telephone follow up appointment with care management team member scheduled for:  03/17/20   Elliot Gurney, [place] Worker  [facility] [phone]

## 2020-03-03 NOTE — Chronic Care Management (AMB) (Signed)
Chronic Care Management    Clinical Social Work Note  03/03/2020 Name: [name] MRN: [id number] DOB: 03/14/1945  [name] is a 75 y.o. year old female who is a primary care patient of [name], [name], MD. The CCM team was consulted to assist the patient with chronic disease management and/or care coordination needs related to: Mental Health Counseling and Resources.   Engaged with patient by telephone for follow up visit in response to provider referral for social work chronic care management and care coordination services.   Consent to Services:  The patient was given information about Chronic Care Management services, agreed to services, and gave verbal consent prior to initiation of services.  Please see initial visit note for detailed documentation.   Patient agreed to services and consent obtained.   Assessment: Review of patient past medical history, allergies, medications, and health status, including review of relevant consultants reports was performed today as part of a comprehensive evaluation and provision of chronic care management and care coordination services.     SDOH (Social Determinants of Health) assessments and interventions performed:    Advanced Directives Status: Not addressed in this encounter.  CCM Care Plan  Allergies  Allergen Reactions  . Levofloxacin     Tongue and mouth swelling  . Calcium Channel Blockers     Other reaction(s): Dizziness  . Amoxicillin Other (See Comments)    Causes yeast infection Has patient had a PCN reaction causing immediate rash, facial/tongue/throat swelling, SOB or lightheadedness with hypotension: No Has patient had a PCN reaction causing severe rash involving mucus membranes or skin necrosis: No Has patient had a PCN reaction that required hospitalization: No Has patient had a PCN reaction occurring within the last 10 years: No If all of the above answers are "NO", then may proceed with Cephalosporin use.   .  Nickel Rash  . Penicillins Other (See Comments)    Causes yeast infection Has patient had a PCN reaction causing immediate rash, facial/tongue/throat swelling, SOB or lightheadedness with hypotension: No Has patient had a PCN reaction causing severe rash involving mucus membranes or skin necrosis: No Has patient had a PCN reaction that required hospitalization: No Has patient had a PCN reaction occurring within the last 10 years: No If all of the above answers are "NO", then may proceed with Cephalosporin use.    Outpatient Encounter Medications as of 03/03/2020  Medication Sig  . albuterol (VENTOLIN HFA) 108 (90 Base) MCG/ACT inhaler TAKE 2 PUFFS BY MOUTH EVERY 6 HOURS AS NEEDED FOR WHEEZE OR SHORTNESS OF BREATH (Patient taking differently: Inhale 2 puffs into the lungs every 6 (six) hours as needed for wheezing or shortness of breath. )  . ALPRAZolam (XANAX) 0.5 MG tablet Take 1 tablet (0.5 mg total) by mouth 3 (three) times daily as needed for anxiety or sleep.  [name] aspirin EC 81 MG tablet Take 81 mg by mouth daily.  . citalopram (CELEXA) 10 MG tablet Take 1 tablet (10 mg total) by mouth daily.  . fluticasone (FLONASE) 50 MCG/ACT nasal spray PLACE 1-2 SPRAYS INTO BOTH NOSTRILS DAILY AS NEEDED FOR ALLERGIES OR RHINITIS.  . HYDROcodone-acetaminophen (NORCO/VICODIN) 5-325 MG tablet Take 1 tablet by mouth every 4 (four) hours as needed for moderate pain.  [name] ibuprofen (ADVIL) 800 MG tablet Take 1 tablet (800 mg total) by mouth daily as needed (severe pain).  [name] omeprazole (PRILOSEC) 20 MG capsule Take 20 mg by mouth daily as needed (acid reflux).    No  facility-administered encounter medications on file as of 03/03/2020.    Patient Active Problem List   Diagnosis Date Noted  . Small bowel obstruction ([place])   . Partial small bowel obstruction ([place]) 02/04/2018  . SBO (small bowel obstruction) ([place]) 03/13/2017  . Panic disorder 02/21/2016  . Weakness of both lower extremities 02/21/2016  .  Depression 02/21/2016  . Problems with swallowing and mastication   . Stricture and stenosis of esophagus   . 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 Acid reflux 10/20/2014  . Hx of colonic polyps   . Benign neoplasm of transverse colon   . Diverticulosis of large intestine without diverticulitis   . Allergic rhinitis 08/01/2014  . Anxiety 08/01/2014  . HLD (hyperlipidemia) 08/01/2014  . BP (high blood pressure) 08/01/2014  . Vitamin D deficiency 08/01/2014  . H/O adenomatous polyp of colon 01/22/2012    Conditions to be addressed/monitored: Anxiety; Mental Health Concerns   Care Plan : Depression (Adult)  Updates made by Vern Claude, LCSW since 03/03/2020 12:00 AM    Problem: Depression Identification (Depression)     Long-Range Goal: Depressive Symptoms Identified   Start Date: 02/16/2020  Expected End Date: 08/16/2020  Recent Progress: On track  Priority: High  Note:   Current Barriers:  [name] Mental Health Concerns  . Difficulty   Clinical Social Work Clinical Goal(s):  [name] Over the next 90 days, patient will work with SW to address concerns related to identifying coping skills to manager her depression as well as connection her to a local mental health provider for ongoing mental health follow up   Interventions: . 1:1 collaboration with [name], [name], MD regarding development and update of comprehensive plan of care as evidenced by provider attestation and co-signature . Inter-disciplinary care team collaboration (see longitudinal plan of care) . Patient interviewed and appropriate assessments performed . Explored patient's feelings regarding the loss of her brother and family friend . Patient reports grief tends to come and go at different times . Patient's grief reactions normalized, coping strategies explored  . Explored follow up with a therapist that specializes in anxiety-patient declines at this time . Provided patient with information about shifting her perspective  and prioritizing self care during this time  Patient Goals/Self-Care Activities Over the next 90 days, patient will:   - Patient will attend all scheduled provider appointments Patient will attend church or other social activities - consider beginning personal counseling - talk about feelings with a friend, family or spiritual advisor - practice positive thinking and self-talk   Follow up Plan: SW will follow up with patient by phone over the next 14 business days     Task: Identify Depressive Symptoms and Facilitate Treatment Completed 03/03/2020  Due Date: 03/01/2020  Priority: Routine  Note:   Care Management Activities:    - mental health treatment to be arranged with patient's consent   Notes: Patient declines at this time      Follow Up Plan: SW will follow up with patient by phone over the next 7-14 business days       Elliot Gurney, [place] Worker  [facility] [phone]

## 2020-03-16 ENCOUNTER — Ambulatory Visit: Payer: Self-pay | Admitting: *Deleted

## 2020-03-16 DIAGNOSIS — F419 Anxiety disorder, unspecified: Secondary | ICD-10-CM

## 2020-03-16 DIAGNOSIS — F329 Major depressive disorder, single episode, unspecified: Secondary | ICD-10-CM

## 2020-03-16 NOTE — Patient Instructions (Signed)
Visit Information  Patient Care Plan: Depression (Adult)    Problem Identified: Depression Identification (Depression)     Long-Range Goal: Depressive Symptoms Identified   Start Date: 02/16/2020  Expected End Date: 08/16/2020  Recent Progress: On track  Priority: High  Note:   Current Barriers:  [name] Mental Health Concerns  . Difficulty   Clinical Social Work Clinical Goal(s):  [name] Over the next 90 days, patient will work with SW to address concerns related to identifying coping skills to manager her depression as well as connection her to a local mental health provider for ongoing mental health follow up   Interventions: . 1:1 collaboration with [name], [name], MD regarding development and update of comprehensive plan of care as evidenced by provider attestation and co-signature . Inter-disciplinary care team collaboration (see longitudinal plan of care) . Patient interviewed and appropriate assessments performed . Continued to explore patient's feelings regarding the loss of family friends . Patient reports grief continues to improve, however did have some medical set backs over the weekend which have seemed to resolved . Patient discussed having some relationships conflicts, problem solving explored  Patient Goals/Self-Care Activities Over the next 90 days, patient will:   - Patient will attend all scheduled provider appointments Patient will attend church or other social activities - consider beginning personal counseling - talk about feelings with a friend, family or spiritual advisor - practice positive thinking and self-talk   Follow up Plan: SW will follow up with patient by phone over the next 14 business days     Task: Identify Depressive Symptoms and Facilitate Treatment Completed 03/03/2020  Due Date: 03/01/2020  Priority: Routine  Note:   Care Management Activities:    - mental health treatment to be arranged with patient's consent   Notes: Patient declines at  this time    The patient verbalized understanding of instructions, educational materials, and care plan provided today and declined offer to receive copy of patient instructions, educational materials, and care plan.   Telephone follow up appointment with care management team member scheduled for:   Elliot Gurney, [place] Worker  [facility] [phone]

## 2020-03-16 NOTE — Chronic Care Management (AMB) (Signed)
Chronic Care Management    Clinical Social Work Note  03/16/2020 Name: [name] MRN: [id number] DOB: 10-Jul-1945  [name] is a 75 y.o. year old female who is a primary care patient of [name], [name], MD. The CCM team was consulted to assist the patient with chronic disease management and/or care coordination needs related to: Mental Health Counseling and Resources.   Engaged with patient by telephone for follow up visit in response to provider referral for social work chronic care management and care coordination services.   Consent to Services:  The patient was given information about Chronic Care Management services, agreed to services, and gave verbal consent prior to initiation of services.  Please see initial visit note for detailed documentation.   Patient agreed to services and consent obtained.   Assessment: Review of patient past medical history, allergies, medications, and health status, including review of relevant consultants reports was performed today as part of a comprehensive evaluation and provision of chronic care management and care coordination services.     SDOH (Social Determinants of Health) assessments and interventions performed:    Advanced Directives Status: Not addressed in this encounter.  CCM Care Plan  Allergies  Allergen Reactions  . Levofloxacin     Tongue and mouth swelling  . Calcium Channel Blockers     Other reaction(s): Dizziness  . Amoxicillin Other (See Comments)    Causes yeast infection Has patient had a PCN reaction causing immediate rash, facial/tongue/throat swelling, SOB or lightheadedness with hypotension: No Has patient had a PCN reaction causing severe rash involving mucus membranes or skin necrosis: No Has patient had a PCN reaction that required hospitalization: No Has patient had a PCN reaction occurring within the last 10 years: No If all of the above answers are "NO", then may proceed with Cephalosporin use.   .  Nickel Rash  . Penicillins Other (See Comments)    Causes yeast infection Has patient had a PCN reaction causing immediate rash, facial/tongue/throat swelling, SOB or lightheadedness with hypotension: No Has patient had a PCN reaction causing severe rash involving mucus membranes or skin necrosis: No Has patient had a PCN reaction that required hospitalization: No Has patient had a PCN reaction occurring within the last 10 years: No If all of the above answers are "NO", then may proceed with Cephalosporin use.    Outpatient Encounter Medications as of 03/16/2020  Medication Sig  . albuterol (VENTOLIN HFA) 108 (90 Base) MCG/ACT inhaler TAKE 2 PUFFS BY MOUTH EVERY 6 HOURS AS NEEDED FOR WHEEZE OR SHORTNESS OF BREATH (Patient taking differently: Inhale 2 puffs into the lungs every 6 (six) hours as needed for wheezing or shortness of breath. )  . ALPRAZolam (XANAX) 0.5 MG tablet Take 1 tablet (0.5 mg total) by mouth 3 (three) times daily as needed for anxiety or sleep.  [name] aspirin EC 81 MG tablet Take 81 mg by mouth daily.  . citalopram (CELEXA) 10 MG tablet Take 1 tablet (10 mg total) by mouth daily.  . fluticasone (FLONASE) 50 MCG/ACT nasal spray PLACE 1-2 SPRAYS INTO BOTH NOSTRILS DAILY AS NEEDED FOR ALLERGIES OR RHINITIS.  . HYDROcodone-acetaminophen (NORCO/VICODIN) 5-325 MG tablet Take 1 tablet by mouth every 4 (four) hours as needed for moderate pain.  [name] ibuprofen (ADVIL) 800 MG tablet Take 1 tablet (800 mg total) by mouth daily as needed (severe pain).  [name] omeprazole (PRILOSEC) 20 MG capsule Take 20 mg by mouth daily as needed (acid reflux).    No  facility-administered encounter medications on file as of 03/16/2020.    Patient Active Problem List   Diagnosis Date Noted  . Small bowel obstruction ([place])   . Partial small bowel obstruction ([place]) 02/04/2018  . SBO (small bowel obstruction) ([place]) 03/13/2017  . Panic disorder 02/21/2016  . Weakness of both lower extremities 02/21/2016  .  Depression 02/21/2016  . Problems with swallowing and mastication   . Stricture and stenosis of esophagus   . 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 Acid reflux 10/20/2014  . Hx of colonic polyps   . Benign neoplasm of transverse colon   . Diverticulosis of large intestine without diverticulitis   . Allergic rhinitis 08/01/2014  . Anxiety 08/01/2014  . HLD (hyperlipidemia) 08/01/2014  . BP (high blood pressure) 08/01/2014  . Vitamin D deficiency 08/01/2014  . H/O adenomatous polyp of colon 01/22/2012    Conditions to be addressed/monitored: Anxiety; Mental Health Concerns   Care Plan : Depression (Adult)  Updates made by Vern Claude, LCSW since 03/16/2020 12:00 AM    Problem: Depression Identification (Depression)     Long-Range Goal: Depressive Symptoms Identified   Start Date: 02/16/2020  Expected End Date: 08/16/2020  Recent Progress: On track  Priority: High  Note:   Current Barriers:  [name] Mental Health Concerns  . Difficulty   Clinical Social Work Clinical Goal(s):  [name] Over the next 90 days, patient will work with SW to address concerns related to identifying coping skills to manager her depression as well as connection her to a local mental health provider for ongoing mental health follow up   Interventions: . 1:1 collaboration with [name], [name], MD regarding development and update of comprehensive plan of care as evidenced by provider attestation and co-signature . Inter-disciplinary care team collaboration (see longitudinal plan of care) . Patient interviewed and appropriate assessments performed . Continued to explore patient's feelings regarding the loss of family friends . Patient reports grief continues to improve, however did have some medical set backs over the weekend which have seemed to resolved . Patient discussed having some relationships conflicts, problem solving explored  Patient Goals/Self-Care Activities Over the next 90 days, patient will:   -  Patient will attend all scheduled provider appointments Patient will attend church or other social activities - consider beginning personal counseling - talk about feelings with a friend, family or spiritual advisor - practice positive thinking and self-talk   Follow up Plan: SW will follow up with patient by phone over the next 14 business days        Follow Up Plan: SW will follow up with patient by phone over the next 14 business days       [employer], [place] Worker  [facility] [phone]

## 2020-03-30 ENCOUNTER — Ambulatory Visit: Payer: Self-pay | Admitting: *Deleted

## 2020-03-30 DIAGNOSIS — R1012 Left upper quadrant pain: Secondary | ICD-10-CM | POA: Diagnosis not present

## 2020-03-30 NOTE — Chronic Care Management (AMB) (Signed)
   03/30/2020  [name] [birth year] [id number]  Phone call to patient for scheduled appointment. Patient has unexpected company and requested a return call on 03/31/20.  Appointment rescheduled for 04/10/20.    Elliot Gurney, [place] Worker  [facility] [phone]

## 2020-03-31 ENCOUNTER — Ambulatory Visit (INDEPENDENT_AMBULATORY_CARE_PROVIDER_SITE_OTHER): Payer: Medicare Other | Admitting: *Deleted

## 2020-03-31 DIAGNOSIS — F329 Major depressive disorder, single episode, unspecified: Secondary | ICD-10-CM

## 2020-03-31 DIAGNOSIS — F419 Anxiety disorder, unspecified: Secondary | ICD-10-CM

## 2020-03-31 NOTE — Patient Instructions (Signed)
Visit Information  PATIENT GOALS:  Goals Addressed            This Visit's Progress   . Manage My Emotions       Timeframe:  Long-Range Goal Priority:  High Start Date:  02/16/20                           Expected End Date: 08/17/19                     Follow Up Date 03/02/2020    - continue to consider beginning personal counseling - talk about feelings with a friend, family or spiritual advisor - practice positive thinking and self-talk     Why is this important?    When you are stressed, down or upset, your body reacts too.   For example, your blood pressure may get higher; you may have a headache or stomachache.   When your emotions get the best of you, your body's ability to fight off cold and flu gets weak.   These steps will help you manage your emotions.     Notes:        Visit Information  PATIENT GOALS:  Goals Addressed            This Visit's Progress   . Manage My Emotions       Timeframe:  Long-Range Goal Priority:  High Start Date:  02/16/20                           Expected End Date: 08/17/19                     Follow Up Date 03/02/2020    - continue to consider beginning personal counseling - talk about feelings with a friend, family or spiritual advisor - practice positive thinking and self-talk     Why is this important?    When you are stressed, down or upset, your body reacts too.   For example, your blood pressure may get higher; you may have a headache or stomachache.   When your emotions get the best of you, your body's ability to fight off cold and flu gets weak.   These steps will help you manage your emotions.     Notes:        The patient verbalized understanding of instructions, educational materials, and care plan provided today and declined offer to receive copy of patient instructions, educational materials, and care plan.   Telephone follow up appointment with care management team member scheduled for:   04/07/20    Elliot Gurney, [place] Worker  [facility] [phone]

## 2020-03-31 NOTE — Chronic Care Management (AMB) (Signed)
Chronic Care Management    Clinical Social Work Note  03/31/2020 Name: [name] MRN: [id number] DOB: 08-06-1945  [name] is a 75 y.o. year old female who is a primary care patient of [name], [name], MD. The CCM team was consulted to assist the patient with chronic disease management and/or care coordination needs related to: Mental Health Counseling and Resources.   Engaged with patient by telephone for follow up visit in response to provider referral for social work chronic care management and care coordination services.   Consent to Services:  The patient was given information about Chronic Care Management services, agreed to services, and gave verbal consent prior to initiation of services.  Please see initial visit note for detailed documentation.   Patient agreed to services and consent obtained.   Assessment: Review of patient past medical history, allergies, medications, and health status, including review of relevant consultants reports was performed today as part of a comprehensive evaluation and provision of chronic care management and care coordination services.     SDOH (Social Determinants of Health) assessments and interventions performed:    Advanced Directives Status: Not addressed in this encounter.  CCM Care Plan  Allergies  Allergen Reactions  . Levofloxacin     Tongue and mouth swelling  . Calcium Channel Blockers     Other reaction(s): Dizziness  . Amoxicillin Other (See Comments)    Causes yeast infection Has patient had a PCN reaction causing immediate rash, facial/tongue/throat swelling, SOB or lightheadedness with hypotension: No Has patient had a PCN reaction causing severe rash involving mucus membranes or skin necrosis: No Has patient had a PCN reaction that required hospitalization: No Has patient had a PCN reaction occurring within the last 10 years: No If all of the above answers are "NO", then may proceed with Cephalosporin use.   .  Nickel Rash  . Penicillins Other (See Comments)    Causes yeast infection Has patient had a PCN reaction causing immediate rash, facial/tongue/throat swelling, SOB or lightheadedness with hypotension: No Has patient had a PCN reaction causing severe rash involving mucus membranes or skin necrosis: No Has patient had a PCN reaction that required hospitalization: No Has patient had a PCN reaction occurring within the last 10 years: No If all of the above answers are "NO", then may proceed with Cephalosporin use.    Outpatient Encounter Medications as of 03/31/2020  Medication Sig  . albuterol (VENTOLIN HFA) 108 (90 Base) MCG/ACT inhaler TAKE 2 PUFFS BY MOUTH EVERY 6 HOURS AS NEEDED FOR WHEEZE OR SHORTNESS OF BREATH (Patient taking differently: Inhale 2 puffs into the lungs every 6 (six) hours as needed for wheezing or shortness of breath. )  . ALPRAZolam (XANAX) 0.5 MG tablet Take 1 tablet (0.5 mg total) by mouth 3 (three) times daily as needed for anxiety or sleep.  [name] aspirin EC 81 MG tablet Take 81 mg by mouth daily.  . citalopram (CELEXA) 10 MG tablet Take 1 tablet (10 mg total) by mouth daily.  . fluticasone (FLONASE) 50 MCG/ACT nasal spray PLACE 1-2 SPRAYS INTO BOTH NOSTRILS DAILY AS NEEDED FOR ALLERGIES OR RHINITIS.  . HYDROcodone-acetaminophen (NORCO/VICODIN) 5-325 MG tablet Take 1 tablet by mouth every 4 (four) hours as needed for moderate pain.  [name] ibuprofen (ADVIL) 800 MG tablet Take 1 tablet (800 mg total) by mouth daily as needed (severe pain).  [name] omeprazole (PRILOSEC) 20 MG capsule Take 20 mg by mouth daily as needed (acid reflux).    No  facility-administered encounter medications on file as of 03/31/2020.    Patient Active Problem List   Diagnosis Date Noted  . Small bowel obstruction ([place])   . Partial small bowel obstruction ([place]) 02/04/2018  . SBO (small bowel obstruction) ([place]) 03/13/2017  . Panic disorder 02/21/2016  . Weakness of both lower extremities 02/21/2016  .  Depression 02/21/2016  . Problems with swallowing and mastication   . Stricture and stenosis of esophagus   . 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 Acid reflux 10/20/2014  . Hx of colonic polyps   . Benign neoplasm of transverse colon   . Diverticulosis of large intestine without diverticulitis   . Allergic rhinitis 08/01/2014  . Anxiety 08/01/2014  . HLD (hyperlipidemia) 08/01/2014  . BP (high blood pressure) 08/01/2014  . Vitamin D deficiency 08/01/2014  . H/O adenomatous polyp of colon 01/22/2012    Conditions to be addressed/monitored: Anxiety and Depression; Mental Health Concerns   Care Plan : Depression (Adult)  Updates made by Vern Claude, LCSW since 03/31/2020 12:00 AM    Problem: Depression Identification (Depression)     Long-Range Goal: Depressive Symptoms Identified   Start Date: 02/16/2020  Expected End Date: 08/16/2020  Recent Progress: On track  Priority: High  Note:   Current Barriers:  [name] Mental Health Concerns  . Difficulty   Clinical Social Work Clinical Goal(s):  [name] Over the next 90 days, patient will work with SW to address concerns related to identifying coping skills to manager her depression as well as connection her to a local mental health provider for ongoing mental health follow up   Interventions: . 1:1 collaboration with [name], [name], MD regarding development and update of comprehensive plan of care as evidenced by provider attestation and co-signature . Inter-disciplinary care team collaboration (see longitudinal plan of care) . Patient interviewed and appropriate assessments performed . Patient discussed having some medical set backs and is not sure of the origin . Patient confirms that she has contacted the specialist that completed her surgery and will be referred to a Cardiologist as a result . Provided patient with emotional support during this time, allowing her to vent her frustrations and verbalize her fears . Self care continued to be  emphasized, positive coping strategies explored, close follow up with her specialist recommended  Patient Goals/Self-Care Activities Over the next 90 days, patient will:   - Patient will attend all scheduled provider appointments Patient will attend church or other social activities - consider beginning personal counseling - talk about feelings with a friend, family or spiritual advisor - practice positive thinking and self-talk   Follow up Plan: SW will follow up with patient by phone over the next 14 business days        Follow Up Plan: SW will follow up with patient by phone over the next 7-14 business days       [place], [place] Worker  [facility] [phone]

## 2020-04-02 ENCOUNTER — Emergency Department: Payer: Medicare Other

## 2020-04-02 ENCOUNTER — Other Ambulatory Visit: Payer: Self-pay

## 2020-04-02 ENCOUNTER — Emergency Department
Admission: EM | Admit: 2020-04-02 | Discharge: 2020-04-02 | Disposition: A | Discharge status: Home / Self Care | Payer: Medicare Other | Attending: Emergency Medicine | Admitting: Emergency Medicine

## 2020-04-02 DIAGNOSIS — R55 Syncope and collapse: Secondary | ICD-10-CM | POA: Insufficient documentation

## 2020-04-02 DIAGNOSIS — Z7982 Long term (current) use of aspirin: Secondary | ICD-10-CM | POA: Diagnosis not present

## 2020-04-02 DIAGNOSIS — I10 Essential (primary) hypertension: Secondary | ICD-10-CM | POA: Diagnosis not present

## 2020-04-02 DIAGNOSIS — K5900 Constipation, unspecified: Secondary | ICD-10-CM | POA: Diagnosis not present

## 2020-04-02 LAB — BASIC METABOLIC PANEL
Anion gap: 10 (ref 5–15)
BUN: 17 mg/dL (ref 8–23)
CO2: 20 mmol/L — ABNORMAL LOW (ref 22–32)
Calcium: 8.8 mg/dL — ABNORMAL LOW (ref 8.9–10.3)
Chloride: 106 mmol/L (ref 98–111)
Creatinine, Ser: 0.8 mg/dL (ref 0.44–1.00)
GFR, Estimated: >60 mL/min (ref >60)
Glucose, Bld: 122 mg/dL — ABNORMAL HIGH (ref 70–99)
Potassium: 3.3 mmol/L — ABNORMAL LOW (ref 3.5–5.1)
Sodium: 136 mmol/L (ref 135–145)

## 2020-04-02 LAB — TROPONIN I (HIGH SENSITIVITY)
Troponin I (High Sensitivity): 3 ng/L (ref <18)
Troponin I (High Sensitivity): 3 ng/L (ref <18)

## 2020-04-02 LAB — CBC
HCT: 45.1 % (ref 36.0–46.0)
Hemoglobin: 15.7 g/dL — ABNORMAL HIGH (ref 12.0–15.0)
MCH: 31 pg (ref 26.0–34.0)
MCHC: 34.8 g/dL (ref 30.0–36.0)
MCV: 89 fL (ref 80.0–100.0)
Platelets: 203 10*3/uL (ref 150–400)
RBC: 5.07 MIL/uL (ref 3.87–5.11)
RDW: 12.9 % (ref 11.5–15.5)
WBC: 6.4 10*3/uL (ref 4.0–10.5)
nRBC: 0 % (ref 0.0–0.2)

## 2020-04-02 LAB — CBG MONITORING, ED: Glucose-Capillary: 128 mg/dL — ABNORMAL HIGH (ref 70–99)

## 2020-04-02 NOTE — ED Provider Notes (Signed)
[facility] Emergency Department Provider Note  ____________________________________________  Time seen: Approximately 3:56 AM  I have reviewed the triage vital signs and the nursing notes.   HISTORY  Chief Complaint Near Syncope   HPI [name] is a 75 y.o. female with a history of SBO, hypertension, hyperlipidemia who presents for evaluation of syncope.  Patient reports having 2 episodes of syncope in the last week both in the setting of having a bowel movement.  Patient reports that she was told by her surgeon not to strain when having a bowel movement because of her pot prior colon surgery.  She says that she sits in the toilet and feels a lot of pressure from the stool.  On both episode she felt clammy and diaphoretic, lightheaded and when she stood up she had a syncopal event.  Loss of consciousness was brief lasting just a few seconds.  She did not injure herself.  She consulted her surgeon because she was concerned this had something to do with her colon surgery.  He told her that this was most likely coming from her heart and it would be great if she came to the hospital and had a bowel movement here while connected to cardiac monitoring so the ER doctors could determine what is going on.  Patient reports that she woke up this morning and felt very anxious that she was going to have a bowel movement and passed out again.  She decided to come in to the hospital to wait for her morning bowel movement here so she could be monitored while having one.  She does report history of syncope in the past but usually not this frequent.  She denies any chest pain or shortness of breath associated with it.  She has no abdominal pain, no vomiting or diarrhea, no fever or chills.    Past Medical History:  Diagnosis Date  . Anxiety   . Arthritis    lower spine  . Back pain    lower back - S/P lifting injury  . Complication of anesthesia    makes hair brittle  . GERD  (gastroesophageal reflux disease)   . Hyperlipidemia   . Hypertension   . Neuromuscular disorder (HCC)    numbness legs and feet s/p lower back injury  . Partial bowel obstruction ([place]) 02/04/2018  . Stroke [facility])    "mini - strokes" 2009 - no deficit  . Vertigo    none recently  . Vitamin D deficiency   . Wears contact lenses     Patient Active Problem List   Diagnosis Date Noted  . Small bowel obstruction ([place])   . Partial small bowel obstruction ([place]) 02/04/2018  . SBO (small bowel obstruction) ([place]) 03/13/2017  . Panic disorder 02/21/2016  . Weakness of both lower extremities 02/21/2016  . Depression 02/21/2016  . Problems with swallowing and mastication   . Stricture and stenosis of esophagus   . 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 Acid reflux 10/20/2014  . Hx of colonic polyps   . Benign neoplasm of transverse colon   . Diverticulosis of large intestine without diverticulitis   . Allergic rhinitis 08/01/2014  . Anxiety 08/01/2014  . HLD (hyperlipidemia) 08/01/2014  . BP (high blood pressure) 08/01/2014  . Vitamin D deficiency 08/01/2014  . H/O adenomatous polyp of colon 01/22/2012    Past Surgical History:  Procedure Laterality Date  . ABDOMINAL HYSTERECTOMY  1987  . CARDIAC CATHETERIZATION  02/2007  . COLON SURGERY  03/12/2012   Dr Phylis Bougie  . COLONOSCOPY WITH PROPOFOL N/A 09/26/2014   Procedure: COLONOSCOPY WITH PROPOFOL;  Surgeon: Lucilla Lame, MD;  Location: [place];  Service: Endoscopy;  Laterality: N/A;  marker (tattoo) used in colon  . COLONOSCOPY WITH PROPOFOL N/A 10/27/2019   Procedure: COLONOSCOPY WITH PROPOFOL;  Surgeon: Robert Bellow, MD;  Location: [facility];  Service: Endoscopy;  Laterality: N/A;  . ESOPHAGEAL DILATION N/A 02/02/2016   Procedure: ESOPHAGEAL DILATION;  Surgeon: Lucilla Lame, MD;  Location: [place];  Service: Endoscopy;  Laterality: N/A;  . ESOPHAGOGASTRODUODENOSCOPY (EGD) WITH PROPOFOL N/A 02/02/2016    Procedure: ESOPHAGOGASTRODUODENOSCOPY (EGD) WITH PROPOFOL;  Surgeon: Lucilla Lame, MD;  Location: [place];  Service: Endoscopy;  Laterality: N/A;  . LAPAROSCOPIC ABDOMINAL EXPLORATION N/A 11/08/2019   Procedure: LAPAROSCOPIC ABDOMINAL EXPLORATION;  Surgeon: Robert Bellow, MD;  Location: [facility];  Service: General;  Laterality: N/A;  POSSIBLE LAPAROTOMY  . POLYPECTOMY  09/26/2014   Procedure: POLYPECTOMY;  Surgeon: Lucilla Lame, MD;  Location: [place];  Service: Endoscopy;;  . [place]  . VENTRAL HERNIA REPAIR N/A 11/08/2019   Procedure: HERNIA REPAIR VENTRAL ADULT;  Surgeon: Robert Bellow, MD;  Location: [facility];  Service: General;  Laterality: N/A;  possible ventral hernia repair    Prior to Admission medications   Medication Sig Start Date End Date Taking? Authorizing Provider  albuterol (VENTOLIN HFA) 108 (90 Base) MCG/ACT inhaler TAKE 2 PUFFS BY MOUTH EVERY 6 HOURS AS NEEDED FOR WHEEZE OR SHORTNESS OF BREATH Patient taking differently: Inhale 2 puffs into the lungs every 6 (six) hours as needed for wheezing or shortness of breath.  09/11/19   Birdie Sons, MD  ALPRAZolam Duanne Moron) 0.5 MG tablet Take 1 tablet (0.5 mg total) by mouth 3 (three) times daily as needed for anxiety or sleep. 02/22/20   Birdie Sons, MD  aspirin EC 81 MG tablet Take 81 mg by mouth daily.    [provider]  citalopram (CELEXA) 10 MG tablet Take 1 tablet (10 mg total) by mouth daily. 01/31/20   Birdie Sons, MD  fluticasone (FLONASE) 50 MCG/ACT nasal spray PLACE 1-2 SPRAYS INTO BOTH NOSTRILS DAILY AS NEEDED FOR ALLERGIES OR RHINITIS. 08/21/19   Birdie Sons, MD  HYDROcodone-acetaminophen (NORCO/VICODIN) 5-325 MG tablet Take 1 tablet by mouth every 4 (four) hours as needed for moderate pain. 11/10/19 11/09/20  Robert Bellow, MD  ibuprofen (ADVIL) 800 MG tablet Take 1 tablet (800 mg total) by mouth daily as needed (severe pain). 11/15/19   Birdie Sons, MD   omeprazole (PRILOSEC) 20 MG capsule Take 20 mg by mouth daily as needed (acid reflux).     [provider]    Allergies Levofloxacin, Calcium channel blockers, Amoxicillin, Nickel, and Penicillins  Family History  Problem Relation Age of Onset  . Diabetes Mother   . Heart disease Mother   . Hypertension Mother   . Mental illness Mother   . Cancer Father        lung cancer  . Drug abuse Brother   . Multiple sclerosis Brother     Social History Social History   Tobacco Use  . Smoking status: Never Smoker  . Smokeless tobacco: Never Used  Vaping Use  . Vaping Use: Never used  Substance Use Topics  . Alcohol use: No    Alcohol/week: 0.0 standard drinks  . Drug use: No    Review of Systems  Constitutional: Negative  for fever. + syncope Eyes: Negative for visual changes. ENT: Negative for sore throat. Neck: No neck pain  Cardiovascular: Negative for chest pain. Respiratory: Negative for shortness of breath. Gastrointestinal: Negative for abdominal pain, vomiting or diarrhea. Genitourinary: Negative for dysuria. Musculoskeletal: Negative for back pain. Skin: Negative for rash. Neurological: Negative for headaches, weakness or numbness. Psych: No SI or HI  ____________________________________________   PHYSICAL EXAM:  VITAL SIGNS: ED Triage Vitals  Enc Vitals Group     BP 04/02/20 0253 (!) 167/109     Pulse Rate 04/02/20 0253 83     Resp 04/02/20 0253 18     Temp 04/02/20 0253 97.9 F (36.6 C)     Temp Source 04/02/20 0253 Oral     SpO2 04/02/20 0253 98 %     Weight 04/02/20 0254 171 lb (77.6 kg)     Height 04/02/20 0254 5\' 3"  (1.6 m)     Head Circumference --      Peak Flow --      Pain Score 04/02/20 0254 0     Pain Loc --      Pain Edu? --      Excl. in [place]? --     Constitutional: Alert and oriented. Well appearing and in no apparent distress. HEENT:      Head: Normocephalic and atraumatic.         Eyes: Conjunctivae are normal. Sclera  is non-icteric.       Mouth/Throat: Mucous membranes are moist.       Neck: Supple with no signs of meningismus. Cardiovascular: Regular rate and rhythm. No murmurs, gallops, or rubs. 2+ symmetrical distal pulses are present in all extremities. No JVD. Respiratory: Normal respiratory effort. Lungs are clear to auscultation bilaterally.  Gastrointestinal: Soft, non tender, and non distended with positive bowel sounds. No rebound or guarding. Genitourinary: No CVA tenderness. Musculoskeletal: No edema, cyanosis, or erythema of extremities. Neurologic: Normal speech and language. Face is symmetric. Moving all extremities. No gross focal neurologic deficits are appreciated. Skin: Skin is warm, dry and intact. No rash noted. Psychiatric: Mood and affect are normal. Speech and behavior are normal.  ____________________________________________   LABS (all labs ordered are listed, but only abnormal results are displayed)  Labs Reviewed  BASIC METABOLIC PANEL - Abnormal; Notable for the following components:      Result Value   Potassium 3.3 (*)    CO2 20 (*)    Glucose, Bld 122 (*)    Calcium 8.8 (*)    All other components within normal limits  CBC - Abnormal; Notable for the following components:   Hemoglobin 15.7 (*)    All other components within normal limits  CBG MONITORING, ED - Abnormal; Notable for the following components:   Glucose-Capillary 128 (*)    All other components within normal limits  TROPONIN I (HIGH SENSITIVITY)  TROPONIN I (HIGH SENSITIVITY)   ____________________________________________  EKG  ED ECG REPORT I, Rudene Re, the attending physician, personally viewed and interpreted this ECG.  Normal sinus rhythm, normal intervals, normal axis, no STE or depressions, no evidence of HOCM, AV block, delta wave, ARVD, prolonged QTc, WPW, or Brug[place].   ____________________________________________  RADIOLOGY   I have personally reviewed the images  performed during this visit and I agree with the Radiologist's read.   Interpretation by Radiologist:  DG Abdomen 1 View  Result Date: 04/02/2020 CLINICAL DATA:  Constipation. EXAM: ABDOMEN - 1 VIEW COMPARISON:  X-ray abdomen 09/24/2019. FINDINGS: The bowel gas  pattern is normal. Anastomotic bowel staples noted overlying the left mid abdomen. Right upper quadrant surgical clips noted. No radio-opaque calculi or other significant radiographic abnormality are seen. IMPRESSION: Negative. Electronically Signed   By: Iven Finn M.D.   On: 04/02/2020 04:50     ____________________________________________   PROCEDURES  Procedure(s) performed:yes .1-3 Lead EKG Interpretation Performed by: Rudene Re, MD Authorized by: Rudene Re, MD     Interpretation: normal     ECG rate assessment: normal     Rhythm: sinus rhythm     Ectopy: none     Critical Care performed:  None ____________________________________________   INITIAL IMPRESSION / ASSESSMENT AND PLAN / ED COURSE  75 y.o. female with a history of SBO, hypertension, hyperlipidemia who presents for evaluation of syncope x 2 preceded by lightheadedness, clammy and diaphoresis.  Last episode was 2 days ago.  Patient comes in today because she was told by her surgeon to come to the ED and be connected to monitoring here while she has a bowel movement so we can see if there is anything wrong with her heart.  Luckily she did not sustain any injuries from her 2 syncopal events.  She has no history of cardiac disease.  Those episodes seem vasovagal based on her history.  EKG showing no signs of dysrhythmias or ischemia.  Patient connected to telemetry for monitoring for any signs of dysrhythmias.  Labs with no signs of hypoglycemia, significant electrolyte derangements, dehydration, anemia, or sepsis. Patient may benefit from a strong bowel regimen to prevent straining or constipation as all her episodes are connected to severe  rectal pressure from stool.  Ddx vasovagal versus orthostasis versus cardiac dysrhythmias versus dehydration versus anemia.  Old medical records reviewed.  Patient has been seen by Dr. Nehemiah Massed in the past for cardiac clearance.  If her work-up here is negative will recommend that she returns to Dr. Nehemiah Massed for Holter monitoring.   _________________________ 7:09 AM on 04/02/2020 -----------------------------------------  Patient monitored on telemetry for several hours with no signs of dysrhythmias.  KUB showing no significant constipation or large rectal stool.  At this time she is stable for discharge home with close follow-up with Dr. Nehemiah Massed for further evaluation.  Discussed my standard return precautions including care and that she should be taking at home to prevent an injury from a syncopal event.  Patient is comfortable with the plan.    _____________________________________________ Please note:  Patient was evaluated in Emergency Department today for the symptoms described in the history of present illness. Patient was evaluated in the context of the global COVID-19 pandemic, which necessitated consideration that the patient might be at risk for infection with the SARS-CoV-2 virus that causes COVID-19. Institutional protocols and algorithms that pertain to the evaluation of patients at risk for COVID-19 are in a state of rapid change based on information released by regulatory bodies including the CDC and federal and state organizations. These policies and algorithms were followed during the patient's care in the ED.  Some ED evaluations and interventions may be delayed as a result of limited staffing during the pandemic.   [place] Controlled Substance Database was reviewed by me. ____________________________________________   FINAL CLINICAL IMPRESSION(S) / ED DIAGNOSES   Final diagnoses:  Syncope, unspecified syncope type      NEW MEDICATIONS STARTED DURING THIS VISIT:  ED  Discharge Orders    None       Note:  This document was prepared using Dragon voice recognition software and may  include unintentional dictation errors.    Alfred Levins, [place], MD 04/02/20 [phone]

## 2020-04-02 NOTE — ED Triage Notes (Signed)
Pt states she keeps having "episodes" of near-syncope when standing after attempting to have a bowel movement for several months. Last near-syncopal episode was Friday. States she came tonight because she feels like she may have one of these episode later but has not had one yet. Denies chest pain, shortness of breath, nausea or vomiting.

## 2020-04-02 NOTE — ED Notes (Signed)
Patient ambulatory to bathroom. Denies lightheaded/dizzy feeling. Pt states "usually if it doesn't do it in the morning it won't throughout the day".

## 2020-04-02 NOTE — Discharge Instructions (Addendum)
Make sure to schedule and appointment with Dr. Nehemiah Massed to discuss Holter monitor so we can monitor your heart at home and hopefully be able to catch one of these episodes. In the meantime, make sure you have assistance in the bathroom to prevent a fall. If you have another episode of passing out, please return to the ER. Also return if you have chest pain or shortness of breath.

## 2020-04-02 NOTE — ED Notes (Signed)
EDP at bedside  

## 2020-04-04 DIAGNOSIS — I700 Atherosclerosis of aorta: Secondary | ICD-10-CM | POA: Diagnosis not present

## 2020-04-04 DIAGNOSIS — I471 Supraventricular tachycardia, unspecified: Secondary | ICD-10-CM | POA: Insufficient documentation

## 2020-04-04 DIAGNOSIS — R55 Syncope and collapse: Secondary | ICD-10-CM | POA: Insufficient documentation

## 2020-04-04 DIAGNOSIS — E7849 Other hyperlipidemia: Secondary | ICD-10-CM | POA: Diagnosis not present

## 2020-04-05 ENCOUNTER — Ambulatory Visit: Payer: Self-pay

## 2020-04-05 ENCOUNTER — Ambulatory Visit: Payer: Self-pay | Admitting: *Deleted

## 2020-04-05 DIAGNOSIS — F329 Major depressive disorder, single episode, unspecified: Secondary | ICD-10-CM

## 2020-04-05 DIAGNOSIS — F419 Anxiety disorder, unspecified: Secondary | ICD-10-CM

## 2020-04-05 DIAGNOSIS — R55 Syncope and collapse: Secondary | ICD-10-CM

## 2020-04-05 NOTE — Chronic Care Management (AMB) (Signed)
Chronic Care Management    Clinical Social Work Note  04/05/2020 Name: [name] MRN: [id number] DOB: 11-11-1945  [name] is a 75 y.o. year old female who is a primary care patient of [name], [name], MD. The CCM team was consulted to assist the patient with chronic disease management and/or care coordination needs related to: Mental Health Counseling and Resources.   Engaged with patient by telephone for follow up visit in response to provider referral for social work chronic care management and care coordination services.   Consent to Services:  The patient was given information about Chronic Care Management services, agreed to services, and gave verbal consent prior to initiation of services.  Please see initial visit note for detailed documentation.   Patient agreed to services and consent obtained.   Assessment: Review of patient past medical history, allergies, medications, and health status, including review of relevant consultants reports was performed today as part of a comprehensive evaluation and provision of chronic care management and care coordination services.     SDOH (Social Determinants of Health) assessments and interventions performed:    Advanced Directives Status: Not addressed in this encounter.  CCM Care Plan  Allergies  Allergen Reactions  . Levofloxacin     Tongue and mouth swelling  . Calcium Channel Blockers     Other reaction(s): Dizziness  . Amoxicillin Other (See Comments)    Causes yeast infection Has patient had a PCN reaction causing immediate rash, facial/tongue/throat swelling, SOB or lightheadedness with hypotension: No Has patient had a PCN reaction causing severe rash involving mucus membranes or skin necrosis: No Has patient had a PCN reaction that required hospitalization: No Has patient had a PCN reaction occurring within the last 10 years: No If all of the above answers are "NO", then may proceed with Cephalosporin use.   .  Nickel Rash  . Penicillins Other (See Comments)    Causes yeast infection Has patient had a PCN reaction causing immediate rash, facial/tongue/throat swelling, SOB or lightheadedness with hypotension: No Has patient had a PCN reaction causing severe rash involving mucus membranes or skin necrosis: No Has patient had a PCN reaction that required hospitalization: No Has patient had a PCN reaction occurring within the last 10 years: No If all of the above answers are "NO", then may proceed with Cephalosporin use.    Outpatient Encounter Medications as of 04/05/2020  Medication Sig  . albuterol (VENTOLIN HFA) 108 (90 Base) MCG/ACT inhaler TAKE 2 PUFFS BY MOUTH EVERY 6 HOURS AS NEEDED FOR WHEEZE OR SHORTNESS OF BREATH (Patient taking differently: Inhale 2 puffs into the lungs every 6 (six) hours as needed for wheezing or shortness of breath. )  . ALPRAZolam (XANAX) 0.5 MG tablet Take 1 tablet (0.5 mg total) by mouth 3 (three) times daily as needed for anxiety or sleep.  [name] aspirin EC 81 MG tablet Take 81 mg by mouth daily.  . citalopram (CELEXA) 10 MG tablet Take 1 tablet (10 mg total) by mouth daily.  . fluticasone (FLONASE) 50 MCG/ACT nasal spray PLACE 1-2 SPRAYS INTO BOTH NOSTRILS DAILY AS NEEDED FOR ALLERGIES OR RHINITIS.  . HYDROcodone-acetaminophen (NORCO/VICODIN) 5-325 MG tablet Take 1 tablet by mouth every 4 (four) hours as needed for moderate pain.  [name] ibuprofen (ADVIL) 800 MG tablet Take 1 tablet (800 mg total) by mouth daily as needed (severe pain).  [name] omeprazole (PRILOSEC) 20 MG capsule Take 20 mg by mouth daily as needed (acid reflux).    No  facility-administered encounter medications on file as of 04/05/2020.    Patient Active Problem List   Diagnosis Date Noted  . Small bowel obstruction ([place])   . Partial small bowel obstruction ([place]) 02/04/2018  . SBO (small bowel obstruction) ([place]) 03/13/2017  . Panic disorder 02/21/2016  . Weakness of both lower extremities 02/21/2016  .  Depression 02/21/2016  . Problems with swallowing and mastication   . Stricture and stenosis of esophagus   . 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 Acid reflux 10/20/2014  . Hx of colonic polyps   . Benign neoplasm of transverse colon   . Diverticulosis of large intestine without diverticulitis   . Allergic rhinitis 08/01/2014  . Anxiety 08/01/2014  . HLD (hyperlipidemia) 08/01/2014  . BP (high blood pressure) 08/01/2014  . Vitamin D deficiency 08/01/2014  . H/O adenomatous polyp of colon 01/22/2012    Conditions to be addressed/monitored: Anxiety and Depression; Mental Health Concerns   Care Plan : Depression (Adult)  Updates made by Vern Claude, LCSW since 04/05/2020 12:00 AM    Problem: Depression Identification (Depression)     Long-Range Goal: Depressive Symptoms Identified   Start Date: 02/16/2020  Expected End Date: 08/16/2020  Recent Progress: On track  Priority: High  Note:   Current Barriers:  [name] Mental Health Concerns  . Difficulty   Clinical Social Work Clinical Goal(s):  [name] Over the next 90 days, patient will work with SW to address concerns related to identifying coping skills to manager her depression as well as connection her to a local mental health provider for ongoing mental health follow up   Interventions: . 1:1 collaboration with [name], [name], MD regarding development and update of comprehensive plan of care as evidenced by provider attestation and co-signature . Inter-disciplinary care team collaboration (see longitudinal plan of care) . Patient interviewed and appropriate assessments performed . Patient discussed having some medical set backs and possible concerns wit her heart . Followed up with a Cardiologist, however may be changing providers . Provided patient with emotional support during this time, allowing her to vent her frustrations and verbalize her fears . Self care continued to be emphasized, positive coping strategies explored, close  follow up with her specialist recommended  Patient Goals/Self-Care Activities Over the next 90 days, patient will:   - Patient will attend all scheduled provider appointments Patient will attend church or other social activities - consider beginning personal counseling - talk about feelings with a friend, family or spiritual advisor - practice positive thinking and self-talk   Follow up Plan: SW will follow up with patient by phone over the next 14 business days        Follow Up Plan: SW will follow up with patient by phone over the next 14 business days       [employer], [place] Worker  [facility] [phone]

## 2020-04-05 NOTE — Telephone Encounter (Signed)
Copied from [place] [id number]. Topic: Referral - Request for Referral >> Apr 05, 2020  9:02 AM Scherrie Gerlach wrote: Has patient seen PCP for this complaint? yes Pt states she has been seeing Dr Nehemiah Massed at [facility] and does not want to see him any longer.  She would like Dr [name] to refer her to another cardiologist

## 2020-04-05 NOTE — Patient Instructions (Signed)
Visit Information  PATIENT GOALS:  Goals Addressed            This Visit's Progress   . Manage My Emotions       Timeframe:  Long-Range Goal Priority:  High Start Date:  02/16/20                           Expected End Date: 08/17/19                     Follow Up Date 03/02/2020    - continue to consider beginning personal counseling - talk about feelings with a friend, family or spiritual advisor - practice positive thinking and self-talk  -follow up with specialist as recommended    Why is this important?    When you are stressed, down or upset, your body reacts too.   For example, your blood pressure may get higher; you may have a headache or stomachache.   When your emotions get the best of you, your body's ability to fight off cold and flu gets weak.   These steps will help you manage your emotions.     Notes:          The patient verbalized understanding of instructions, educational materials, and care plan provided today and declined offer to receive copy of patient instructions, educational materials, and care plan.   Telephone follow up appointment with care management team member scheduled for:  04/19/20    Elliot Gurney, [place] Worker  [facility] Management [phone]

## 2020-04-05 NOTE — Telephone Encounter (Signed)
Pt. Reports she has had fainting spells when she she has a bowel movement for years. Went to ED 04/02/20. Saw cardiologist yesterday, "which I do not like and I want a referral to another heart Dr. Deborha Payment did put a heart monitor on me. I want to see the African doctor with [facility]. I think his name is Igbor." States she "feels nervous this morning. I take anxiety medicine for it." States she will have someone come stay with her this morning. Instructed if she starts feeling faint again, to call 911. Verbalizes understanding. Declines visit with PCP. Please advise pt.   Answer Assessment - Initial Assessment Questions 1. DESCRIPTION: "Describe your dizziness."     Not dizzy now 2. LIGHTHEADED: "Do you feel lightheaded?" (e.g., somewhat faint, woozy, weak upon standing)     Not now 3. VERTIGO: "Do you feel like either you or the room is spinning or tilting?" (i.e. vertigo)     No 4. SEVERITY: "How bad is it?"  "Do you feel like you are going to faint?" "Can you stand and walk?"   - MILD: Feels slightly dizzy, but walking normally.   - MODERATE: Feels very unsteady when walking, but not falling; interferes with normal activities (e.g., school, work) .   - SEVERE: Unable to walk without falling, or requires assistance to walk without falling; feels like passing out now.      Mild 5. ONSET:  "When did the dizziness begin?"     Started years ago 6. AGGRAVATING FACTORS: "Does anything make it worse?" (e.g., standing, change in head position)     Having a BM. 7. HEART RATE: "Can you tell me your heart rate?" "How many beats in 15 seconds?"  (Note: not all patients can do this)       No 8. CAUSE: "What do you think is causing the dizziness?"     They said it is my heart 9. RECURRENT SYMPTOM: "Have you had dizziness before?" If Yes, ask: "When was the last time?" "What happened that time?"     Yes- for years. 10. OTHER SYMPTOMS: "Do you have any other symptoms?" (e.g., fever, chest pain, vomiting, diarrhea,  bleeding)       Feels nervous. 11. PREGNANCY: "Is there any chance you are pregnant?" "When was your last menstrual period?"       No  Protocols used: DIZZINESS [facility]

## 2020-04-06 NOTE — Addendum Note (Signed)
Addended by: Birdie Sons on: 04/06/2020 09:02 AM   Modules accepted: Orders

## 2020-04-06 NOTE — Telephone Encounter (Signed)
Referral placed.

## 2020-04-07 ENCOUNTER — Telehealth: Payer: Self-pay

## 2020-04-07 ENCOUNTER — Ambulatory Visit: Payer: Medicare Other | Admitting: *Deleted

## 2020-04-07 DIAGNOSIS — F419 Anxiety disorder, unspecified: Secondary | ICD-10-CM

## 2020-04-07 DIAGNOSIS — F329 Major depressive disorder, single episode, unspecified: Secondary | ICD-10-CM | POA: Diagnosis not present

## 2020-04-09 NOTE — Patient Instructions (Signed)
Visit Information  PATIENT GOALS:  Goals Addressed            This Visit's Progress   . Manage My Emotions       Timeframe:  Long-Range Goal Priority:  High Start Date:  02/16/20                           Expected End Date: 08/17/19                     Follow Up Date 03/02/2020    - continue to consider beginning personal counseling - talk about feelings with a friend, family or spiritual advisor - practice positive thinking and self-talk  -follow up with specialist of choice, as recommended    Why is this important?    When you are stressed, down or upset, your body reacts too.   For example, your blood pressure may get higher; you may have a headache or stomachache.   When your emotions get the best of you, your body's ability to fight off cold and flu gets weak.   These steps will help you manage your emotions.     Notes:        The patient verbalized understanding of instructions, educational materials, and care plan provided today and declined offer to receive copy of patient instructions, educational materials, and care plan.   Telephone follow up appointment with care management team member scheduled for:   Elliot Gurney, [place] Worker  [facility] [phone]

## 2020-04-09 NOTE — Chronic Care Management (AMB) (Signed)
Chronic Care Management    Clinical Social Work Note  04/09/2020 Name: [name] MRN: [id number] DOB: Apr 28, 1945  [name] is a 75 y.o. year old female who is a primary care patient of [name], [name], MD. The CCM team was consulted to assist the patient with chronic disease management and/or care coordination needs related to: Mental Health Counseling and Resources.   Engaged with patient by telephone for follow up visit in response to provider referral for social work chronic care management and care coordination services.   Consent to Services:  The patient was given information about Chronic Care Management services, agreed to services, and gave verbal consent prior to initiation of services.  Please see initial visit note for detailed documentation.   Patient agreed to services and consent obtained.   Assessment: Review of patient past medical history, allergies, medications, and health status, including review of relevant consultants reports was performed today as part of a comprehensive evaluation and provision of chronic care management and care coordination services.     SDOH (Social Determinants of Health) assessments and interventions performed:    Advanced Directives Status: Not addressed in this encounter.  CCM Care Plan  Allergies  Allergen Reactions  . Levofloxacin     Tongue and mouth swelling  . Calcium Channel Blockers     Other reaction(s): Dizziness  . Amoxicillin Other (See Comments)    Causes yeast infection Has patient had a PCN reaction causing immediate rash, facial/tongue/throat swelling, SOB or lightheadedness with hypotension: No Has patient had a PCN reaction causing severe rash involving mucus membranes or skin necrosis: No Has patient had a PCN reaction that required hospitalization: No Has patient had a PCN reaction occurring within the last 10 years: No If all of the above answers are "NO", then may proceed with Cephalosporin use.   .  Nickel Rash  . Penicillins Other (See Comments)    Causes yeast infection Has patient had a PCN reaction causing immediate rash, facial/tongue/throat swelling, SOB or lightheadedness with hypotension: No Has patient had a PCN reaction causing severe rash involving mucus membranes or skin necrosis: No Has patient had a PCN reaction that required hospitalization: No Has patient had a PCN reaction occurring within the last 10 years: No If all of the above answers are "NO", then may proceed with Cephalosporin use.    Outpatient Encounter Medications as of 04/07/2020  Medication Sig  . albuterol (VENTOLIN HFA) 108 (90 Base) MCG/ACT inhaler TAKE 2 PUFFS BY MOUTH EVERY 6 HOURS AS NEEDED FOR WHEEZE OR SHORTNESS OF BREATH (Patient taking differently: Inhale 2 puffs into the lungs every 6 (six) hours as needed for wheezing or shortness of breath. )  . ALPRAZolam (XANAX) 0.5 MG tablet Take 1 tablet (0.5 mg total) by mouth 3 (three) times daily as needed for anxiety or sleep.  [name] aspirin EC 81 MG tablet Take 81 mg by mouth daily.  . citalopram (CELEXA) 10 MG tablet Take 1 tablet (10 mg total) by mouth daily.  . fluticasone (FLONASE) 50 MCG/ACT nasal spray PLACE 1-2 SPRAYS INTO BOTH NOSTRILS DAILY AS NEEDED FOR ALLERGIES OR RHINITIS.  . HYDROcodone-acetaminophen (NORCO/VICODIN) 5-325 MG tablet Take 1 tablet by mouth every 4 (four) hours as needed for moderate pain.  [name] ibuprofen (ADVIL) 800 MG tablet Take 1 tablet (800 mg total) by mouth daily as needed (severe pain).  [name] omeprazole (PRILOSEC) 20 MG capsule Take 20 mg by mouth daily as needed (acid reflux).    No  facility-administered encounter medications on file as of 04/07/2020.    Patient Active Problem List   Diagnosis Date Noted  . Small bowel obstruction ([place])   . Partial small bowel obstruction ([place]) 02/04/2018  . SBO (small bowel obstruction) ([place]) 03/13/2017  . Panic disorder 02/21/2016  . Weakness of both lower extremities 02/21/2016  .  Depression 02/21/2016  . Problems with swallowing and mastication   . Stricture and stenosis of esophagus   . 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 Acid reflux 10/20/2014  . Hx of colonic polyps   . Benign neoplasm of transverse colon   . Diverticulosis of large intestine without diverticulitis   . Allergic rhinitis 08/01/2014  . Anxiety 08/01/2014  . HLD (hyperlipidemia) 08/01/2014  . BP (high blood pressure) 08/01/2014  . Vitamin D deficiency 08/01/2014  . H/O adenomatous polyp of colon 01/22/2012    Conditions to be addressed/monitored:  Mental Health Concerns   Care Plan : Depression (Adult)  Updates made by Vern Claude, LCSW since 04/09/2020 12:00 AM    Problem: Depression Identification (Depression)     Long-Range Goal: Depressive Symptoms Identified   Start Date: 02/16/2020  Expected End Date: 08/16/2020  Recent Progress: On track  Priority: High  Note:   Current Barriers:  [name] Mental Health Concerns  . Difficulty   Clinical Social Work Clinical Goal(s):  [name] Over the next 90 days, patient will work with SW to address concerns related to identifying coping skills to manager her depression as well as connection her to a local mental health provider for ongoing mental health follow up   Interventions: . 1:1 collaboration with [name], [name], MD regarding development and update of comprehensive plan of care as evidenced by provider attestation and co-signature . Inter-disciplinary care team collaboration (see longitudinal plan of care) . Patient interviewed and appropriate assessments performed . Patient discussed having some medical set backs and possible concerns with her heart . Followed up with a Cardiologist recommended, however she continues the search for a new provider . Patient explored several different practices and requested assistance with her choice . This social worker recommended follow up with her provider regarding cardiologist needs . Provided patient  with emotional support during this time, allowing her to vent her frustrations and verbalize her fears . Self care continued to be emphasized, positive coping strategies explored, close follow up with her specialist recommended  Patient Goals/Self-Care Activities Over the next 90 days, patient will:   - Patient will attend all scheduled provider appointments Patient will attend church or other social activities - consider beginning personal counseling - talk about feelings with a friend, family or spiritual advisor - practice positive thinking and self-talk   Follow up Plan: SW will follow up with patient by phone over the next 14 business days        Follow Up Plan: SW will follow up with patient by phone over the next 14 business days       [employer], [place] Worker  [place] [facility] [phone]

## 2020-04-12 ENCOUNTER — Other Ambulatory Visit: Payer: Self-pay

## 2020-04-12 ENCOUNTER — Ambulatory Visit: Payer: Medicare Other | Admitting: Internal Medicine

## 2020-04-12 ENCOUNTER — Encounter: Payer: Self-pay | Admitting: Internal Medicine

## 2020-04-12 VITALS — BP 126/84 | HR 77 | Ht 63.0 in | Wt 169.0 lb

## 2020-04-12 DIAGNOSIS — I10 Essential (primary) hypertension: Secondary | ICD-10-CM

## 2020-04-12 DIAGNOSIS — E7800 Pure hypercholesterolemia, unspecified: Secondary | ICD-10-CM

## 2020-04-12 DIAGNOSIS — R002 Palpitations: Secondary | ICD-10-CM

## 2020-04-12 DIAGNOSIS — R55 Syncope and collapse: Secondary | ICD-10-CM | POA: Diagnosis not present

## 2020-04-12 NOTE — Progress Notes (Unsigned)
New Outpatient Visit Date: 04/12/2020  Referring Provider: Birdie Sons, MD [address] [place],  [place] [address]  Chief Complaint: Syncope  HPI:  [name] is a 75 y.o. female who is being seen today for the evaluation of syncope at the request of Dr. [name]. She has a history of hypertension, hyperlipidemia, questionable stroke, GERD, partial SBO, and back pain with lower extremity paresthesias.  Today, [name] reports that she has had multiple episodes of passing out over the course dating back to the 41s.  This almost always happens when she is having bowel issues that she attributes to her history of prior bowel surgeries.  Every few years, she would find herself straining in the bathroom and suddenly become lightheaded and wake up on the floor.  She has been evaluated in the past by Dr. Nehemiah Massed, most recently earlier this month after her last episode of syncope.  However, she wishes to transition her care to our office.  Notably, she wore a cardiac monitor for 5 days last week and is still waiting to hear the results.  Stress testing and echocardiography were also recommended by Dr. Nehemiah Massed but have yet to be performed.  Earlier this month, [name] reported not having had a bowel movement for about 3 days.  She suddenly felt like she needed to have a bowel movement and went to sit on the toilet.  She was overcome by nausea and diaphoresis.  She believes she was able to pass a small amount of stool but then got extremely weak and was unable to stand up.  [name] felt like her heart was racing at the time.  Her husband came to help her up and then noticed his wife passed out for a few seconds.  She woke up on the floor and gradually regained her strength.  She recently mentioned this to Dr. Bary Castilla, her GI specialist, who told her this was likely her heart.  [name] was evaluated in the emergency department, where work-up was unrevealing.  Her episode was  attributed to a vasovagal response.  On further questioning, [name] reports an episode of transient chest pain a few weeks ago that she describes as "a little sharp."  It occurred while she was lying in bed.  She now wonders if it may have been gas pain.  She has not had any exertional symptoms.  She denies shortness of breath and edema.  --------------------------------------------------------------------------------------------------  Cardiovascular History & Procedures: Cardiovascular Problems:  Recurrent syncope  Atypical chest pain  Risk Factors:  Hypertension, hyperlipidemia, questionable stroke, and age greater than 81  Cath/PCI:  None  CV Surgery:  None  EP Procedures and Devices:  None available  Non-Invasive Evaluation(s):  TTE (05/19/2017, [facility]): Normal LV size with mild LVH.  LVEF 60%.  Normal RV size and function.  Moderate aortic regurgitation.  Mild mitral and tricuspid regurgitation.  Pharmacologic MPI (12/08/2008, [facility]): Normal study without evidence of ischemia or scar.  LVEF 72%  Recent CV Pertinent Labs: Lab Results  Component Value Date   CHOL 261 (H) 10/01/2017   CHOL 235 (H) 06/18/2012   HDL 59 10/01/2017   HDL 47 06/18/2012   LDLCALC 189 (H) 10/01/2017   LDLCALC 169 (H) 06/18/2012   LDLDIRECT 217 (H) 04/29/2017   TRIG 67 10/01/2017   TRIG 96 06/18/2012   CHOLHDL 4.4 10/01/2017   BNP 31 01/03/2017   K 3.3 (L) 04/02/2020   K 3.0 (L) 06/05/2014   MG  2.1 09/24/2019   MG 1.9 06/19/2012   BUN 17 04/02/2020   BUN 13 08/11/2018   BUN 17 06/05/2014   CREATININE 0.80 04/02/2020   CREATININE 0.83 06/05/2014    --------------------------------------------------------------------------------------------------  Past Medical History:  Diagnosis Date  . Anxiety   . Arthritis    lower spine  . Back pain    lower back - S/P lifting injury  . Complication of anesthesia    makes hair brittle  . GERD (gastroesophageal reflux  disease)   . Hyperlipidemia   . Hypertension   . Neuromuscular disorder (H[facility])    numbness legs and feet s/p lower back injury  . Partial bowel obstruction ([place]) 02/04/2018  . Stroke [facility])    "mini - strokes" 2009 - no deficit  . Vertigo    none recently  . Vitamin D deficiency   . Wears contact lenses     Past Surgical History:  Procedure Laterality Date  . ABDOMINAL HYSTERECTOMY  1987  . CARDIAC CATHETERIZATION  02/2007  . COLON SURGERY  03/12/2012   Dr Phylis Bougie  . COLONOSCOPY WITH PROPOFOL N/A 09/26/2014   Procedure: COLONOSCOPY WITH PROPOFOL;  Surgeon: Lucilla Lame, MD;  Location: [place];  Service: Endoscopy;  Laterality: N/A;  marker (tattoo) used in colon  . COLONOSCOPY WITH PROPOFOL N/A 10/27/2019   Procedure: COLONOSCOPY WITH PROPOFOL;  Surgeon: Robert Bellow, MD;  Location: [facility];  Service: Endoscopy;  Laterality: N/A;  . ESOPHAGEAL DILATION N/A 02/02/2016   Procedure: ESOPHAGEAL DILATION;  Surgeon: Lucilla Lame, MD;  Location: [place];  Service: Endoscopy;  Laterality: N/A;  . ESOPHAGOGASTRODUODENOSCOPY (EGD) WITH PROPOFOL N/A 02/02/2016   Procedure: ESOPHAGOGASTRODUODENOSCOPY (EGD) WITH PROPOFOL;  Surgeon: Lucilla Lame, MD;  Location: [place];  Service: Endoscopy;  Laterality: N/A;  . LAPAROSCOPIC ABDOMINAL EXPLORATION N/A 11/08/2019   Procedure: LAPAROSCOPIC ABDOMINAL EXPLORATION;  Surgeon: Robert Bellow, MD;  Location: [facility];  Service: General;  Laterality: N/A;  POSSIBLE LAPAROTOMY  . POLYPECTOMY  09/26/2014   Procedure: POLYPECTOMY;  Surgeon: Lucilla Lame, MD;  Location: [place];  Service: Endoscopy;;  . [place]  . VENTRAL HERNIA REPAIR N/A 11/08/2019   Procedure: HERNIA REPAIR VENTRAL ADULT;  Surgeon: Robert Bellow, MD;  Location: [facility];  Service: General;  Laterality: N/A;  possible ventral hernia repair    Current Meds  Medication Sig  . albuterol (VENTOLIN HFA) 108 (90 Base) MCG/ACT  inhaler TAKE 2 PUFFS BY MOUTH EVERY 6 HOURS AS NEEDED FOR WHEEZE OR SHORTNESS OF BREATH  . ALPRAZolam (XANAX) 0.5 MG tablet Take 1 tablet (0.5 mg total) by mouth 3 (three) times daily as needed for anxiety or sleep.  [name] aspirin EC 81 MG tablet Take 81 mg by mouth daily.  . citalopram (CELEXA) 10 MG tablet Take 1 tablet (10 mg total) by mouth daily.  . fluticasone (FLONASE) 50 MCG/ACT nasal spray PLACE 1-2 SPRAYS INTO BOTH NOSTRILS DAILY AS NEEDED FOR ALLERGIES OR RHINITIS.  . HYDROcodone-acetaminophen (NORCO/VICODIN) 5-325 MG tablet Take 1 tablet by mouth every 4 (four) hours as needed for moderate pain.  [name] ibuprofen (ADVIL) 800 MG tablet Take 1 tablet (800 mg total) by mouth daily as needed (severe pain).  [name] omeprazole (PRILOSEC) 20 MG capsule Take 20 mg by mouth daily as needed (acid reflux).     Allergies: Levofloxacin, Calcium channel blockers, Amoxicillin, Nickel, and Penicillins  Social History   Tobacco Use  . Smoking status: Never Smoker  . Smokeless tobacco: Never  Used  Vaping Use  . Vaping Use: Never used  Substance Use Topics  . Alcohol use: No    Alcohol/week: 0.0 standard drinks  . Drug use: No    Family History  Problem Relation Age of Onset  . Diabetes Mother   . Heart disease Mother   . Hypertension Mother   . Mental illness Mother   . Cancer Father        lung cancer  . Drug abuse Brother   . Multiple sclerosis Brother     Review of Systems: A 12-system review of systems was performed and was negative except as noted in the HPI.  --------------------------------------------------------------------------------------------------  Physical Exam: BP 126/84 (BP Location: Right Arm, Patient Position: Sitting, Cuff Size: Normal)   Pulse 77   Ht 5\' 3"  (1.6 m)   Wt 169 lb (76.7 kg)   BMI 29.94 kg/m   General: NAD. HEENT: No conjunctival pallor or scleral icterus. Facemask in place. Neck: Supple without lymphadenopathy, thyromegaly, JVD, or HJR. No carotid  bruit. Lungs: Normal work of breathing. Clear to auscultation bilaterally without wheezes or crackles. Heart: Regular rate and rhythm without murmurs, rubs, or gallops. Non-displaced PMI. Abd: Bowel sounds present. Soft, NT/ND without hepatosplenomegaly Ext: No lower extremity edema. Radial, PT, and DP pulses are 2+ bilaterally Skin: Warm and dry without rash. Neuro: CNIII-XII intact. Strength and fine-touch sensation intact in upper and lower extremities bilaterally. Psych: Labile affect with the patient tearful during much of the interview.  EKG: Normal sinus rhythm without abnormality.  Lab Results  Component Value Date   WBC 6.4 04/02/2020   HGB 15.7 (H) 04/02/2020   HCT 45.1 04/02/2020   MCV 89.0 04/02/2020   PLT 203 04/02/2020    Lab Results  Component Value Date   NA 136 04/02/2020   K 3.3 (L) 04/02/2020   CL 106 04/02/2020   CO2 20 (L) 04/02/2020   BUN 17 04/02/2020   CREATININE 0.80 04/02/2020   GLUCOSE 122 (H) 04/02/2020   ALT 14 09/23/2019    Lab Results  Component Value Date   CHOL 261 (H) 10/01/2017   HDL 59 10/01/2017   LDLCALC 189 (H) 10/01/2017   LDLDIRECT 217 (H) 04/29/2017   TRIG 67 10/01/2017   CHOLHDL 4.4 10/01/2017     --------------------------------------------------------------------------------------------------  ASSESSMENT AND PLAN: Recurrent syncope: Symptoms are most consistent with vasovagal mechanism, given that syncope is almost always associated with abdominal discomfort, constipation, and defecation.  We discussed recognition of potential triggers and treatments to help mitigate these.  In particular, intermittent use of a laxative may be necessary in order to help prevent constipation.  I advised Ms. Vandiver to sit/lie down, if possible, when she has early symptoms.  We will reach out to Dr. Alveria Apley office for results of recent event monitor.  I recommended that we obtain a transthoracic echocardiogram, given history of valvular heart  disease on prior echo in 2019.  I advised [name] to refrain from driving or operating heavy machinery pending completion of our work-up and up to 6 months from the time of her most recent syncopal episode.  She should stay well-hydrated.  Palpitations: Noted during recent syncopal episode.  EKG today unremarkable.  We will reach out to Dr. Alveria Apley office for results of recent event monitor.  Hypertension: Blood pressure upper normal today.  No medication changes at this time.  Hyperlipidemia: Severe elevation of lipids noted on the last results available in our system in 2019 (LDL 189).  Consider repeating  lipid panel at the patient's convenience.  I will defer this to Dr. [name].  Based on results of echocardiogram, repeat ischemia evaluation may be needed and lipid therapy added based on results.  Follow-up: Return to clinic in 1 month.  Nelva Bush, MD 04/12/2020 10:27 AM

## 2020-04-12 NOTE — Patient Instructions (Signed)
Please sign release for Monitor results from [facility].   Medication Instructions:  Your physician recommends that you continue on your current medications as directed. Please refer to the Current Medication list given to you today.  *If you need a refill on your cardiac medications before your next appointment, please call your pharmacy*  Lab Work: none If you have labs (blood work) drawn today and your tests are completely normal, you will receive your results only by: [name] MyChart Message (if you have MyChart) OR . A paper copy in the mail If you have any lab test that is abnormal or we need to change your treatment, we will call you to review the results.  Testing/Procedures: Your physician has requested that you have an echocardiogram. Echocardiography is a painless test that uses sound waves to create images of your heart. It provides your doctor with information about the size and shape of your heart and how well your heart's chambers and valves are working. This procedure takes approximately one hour. There are no restrictions for this procedure. There is a possibility that an IV may need to be started during your test to inject an image enhancing agent. This is done to obtain more optimal pictures of your heart. Therefore we ask that you do at least drink some water prior to coming in to hydrate your veins.    Follow-Up: At [facility], you and your health needs are our priority.  As part of our continuing mission to provide you with exceptional heart care, we have created designated Provider Care Teams.  These Care Teams include your primary Cardiologist (physician) and Advanced Practice Providers (APPs -  Physician Assistants and Nurse Practitioners) who all work together to provide you with the care you need, when you need it.  We recommend signing up for the patient portal called "MyChart".  Sign up information is provided on this After Visit Summary.  MyChart is used to  connect with patients for Virtual Visits (Telemedicine).  Patients are able to view lab/test results, encounter notes, upcoming appointments, etc.  Non-urgent messages can be sent to your provider as well.   To learn more about what you can do with MyChart, go to [email or web address].    Your next appointment:   1 month(s)  The format for your next appointment:   In Person  Provider:   You may see DR Harrell Gave END or one of the following Advanced Practice Providers on your designated Care Team:    Murray Hodgkins, NP  Christell Faith, PA-C  Marrianne Mood, PA-C  Cadence Leigh, [place]  Laurann [place], NP   Echocardiogram An echocardiogram is a test that uses sound waves (ultrasound) to produce images of the heart. Images from an echocardiogram can provide important information about:  Heart size and shape.  The size and thickness and movement of your heart's walls.  Heart muscle function and strength.  Heart valve function or if you have stenosis. Stenosis is when the heart valves are too narrow.  If blood is flowing backward through the heart valves (regurgitation).  A tumor or infectious growth around the heart valves.  Areas of heart muscle that are not working well because of poor blood flow or injury from a heart attack.  Aneurysm detection. An aneurysm is a weak or damaged part of an artery wall. The wall bulges out from the normal force of blood pumping through the body. Tell a health care provider about:  Any allergies you have.  All medicines you  are taking, including vitamins, herbs, eye drops, creams, and over-the-counter medicines.  Any blood disorders you have.  Any surgeries you have had.  Any medical conditions you have.  Whether you are pregnant or may be pregnant. What are the risks? Generally, this is a safe test. However, problems may occur, including an allergic reaction to dye (contrast) that may be used during the test. What happens  before the test? No specific preparation is needed. You may eat and drink normally. What happens during the test?  You will take off your clothes from the waist up and put on a hospital gown.  Electrodes or electrocardiogram (ECG)patches may be placed on your chest. The electrodes or patches are then connected to a device that monitors your heart rate and rhythm.  You will lie down on a table for an ultrasound exam. A gel will be applied to your chest to help sound waves pass through your skin.  A handheld device, called a transducer, will be pressed against your chest and moved over your heart. The transducer produces sound waves that travel to your heart and bounce back (or "echo" back) to the transducer. These sound waves will be captured in real-time and changed into images of your heart that can be viewed on a video monitor. The images will be recorded on a computer and reviewed by your health care provider.  You may be asked to change positions or hold your breath for a short time. This makes it easier to get different views or better views of your heart.  In some cases, you may receive contrast through an IV in one of your veins. This can improve the quality of the pictures from your heart. The procedure may vary among health care providers and hospitals.   What can I expect after the test? You may return to your normal, everyday life, including diet, activities, and medicines, unless your health care provider tells you not to do that. Follow these instructions at home:  It is up to you to get the results of your test. Ask your health care provider, or the department that is doing the test, when your results will be ready.  Keep all follow-up visits. This is important. Summary  An echocardiogram is a test that uses sound waves (ultrasound) to produce images of the heart.  Images from an echocardiogram can provide important information about the size and shape of your heart, heart  muscle function, heart valve function, and other possible heart problems.  You do not need to do anything to prepare before this test. You may eat and drink normally.  After the echocardiogram is completed, you may return to your normal, everyday life, unless your health care provider tells you not to do that. This information is not intended to replace advice given to you by your health care provider. Make sure you discuss any questions you have with your health care provider. Document Revised: 09/28/2019 Document Reviewed: 09/28/2019 Elsevier Patient Education  2021 [employer].

## 2020-04-14 ENCOUNTER — Encounter: Payer: Self-pay | Admitting: Internal Medicine

## 2020-04-14 DIAGNOSIS — R55 Syncope and collapse: Secondary | ICD-10-CM | POA: Insufficient documentation

## 2020-04-14 DIAGNOSIS — R002 Palpitations: Secondary | ICD-10-CM | POA: Insufficient documentation

## 2020-04-14 DIAGNOSIS — E7800 Pure hypercholesterolemia, unspecified: Secondary | ICD-10-CM | POA: Insufficient documentation

## 2020-04-20 ENCOUNTER — Telehealth: Payer: Self-pay | Admitting: *Deleted

## 2020-04-20 DIAGNOSIS — R55 Syncope and collapse: Secondary | ICD-10-CM

## 2020-04-20 NOTE — Telephone Encounter (Signed)
Spoke with receptionist at [facility]. She looked in patient's record and did not see any results for a cardiac monitor at this time. She see where Dr Nehemiah Massed saw patient and noted Cardiac event event monitor/loop hookup in plan of care. However, this has not been done yet. Patient is scheduled carotid doppler, echo and stress test on April 4th at [facility].   Advised patient is scheduled here for echo on march 10. Dr Darnelle Bos last note said patient wanted to transition care to our office.  I will call patient tomorrow to see if I can see what she wants to do concerning her cardiology plan of care.

## 2020-04-21 NOTE — Telephone Encounter (Signed)
Spoke to patient. States she DOES want to stay with Dr End as her cardiologist and not return to [facility] [facility]. I let her know that [facility] has her scheduled for echo, carotid, and stress test on April 4th. Advised her to call and cancel so she does not get a no show fee.  Also, let her know they did not have results of a heart monitor or Holter. She said she did wear one and had to write down when she pressed the button and even still has the tape on her skin. Asked if she would call to see if she can request results to be faxed to [place]. Stated she really does not want to have to deal with them. States she would wear another monitor if Dr End would like her to do so.  Routing to Dr End to let him know. Patient willing to wear cardiac monitor from [place] if needed.

## 2020-04-24 ENCOUNTER — Other Ambulatory Visit: Payer: Self-pay | Admitting: Family Medicine

## 2020-04-24 NOTE — Telephone Encounter (Signed)
Medication: citalopram (CELEXA) 10 MG tablet [[id number]]   Has the patient contacted their pharmacy? YES (Agent: If no, request that the patient contact the pharmacy for the refill.) (Agent: If yes, when and what did the pharmacy advise?)  Preferred Pharmacy (with phone number or street name): [id number] - [place], [address] [place] [address] Phone: [phone] Fax: [phone] Hours: Not open 24 hours    Agent: Please be advised that RX refills may take up to 3 business days. We ask that you follow-up with your pharmacy.

## 2020-04-24 NOTE — Telephone Encounter (Signed)
If we are unable to obtain monitor results from [facility] cardiology and the patient does not wish to contact them herself, I would recommend having her wear a Zio XT monitor for 14 days.  Ms. Schmuhl should be aware that since she just wore a monitor, her insurance may not approve her wearing another monitor which would result in her needing to pay out of pocket.  Nelva Bush, MD [facility]

## 2020-04-24 NOTE — Telephone Encounter (Signed)
Called patient. She did call [facility] and cancelled her appointment with them. She would like to wait and see if [facility] gets the results of the monitor over the next few days before proceeding with calling her insurance company about how much another monitor would cost her.  She has echo here in our office on Thursday. Advised I will give it another day or so and then call [facility] to see if they have the monitor.

## 2020-04-25 ENCOUNTER — Ambulatory Visit (INDEPENDENT_AMBULATORY_CARE_PROVIDER_SITE_OTHER): Payer: Medicare Other | Admitting: *Deleted

## 2020-04-25 DIAGNOSIS — F329 Major depressive disorder, single episode, unspecified: Secondary | ICD-10-CM

## 2020-04-25 DIAGNOSIS — F419 Anxiety disorder, unspecified: Secondary | ICD-10-CM | POA: Diagnosis not present

## 2020-04-25 NOTE — Patient Instructions (Signed)
Visit Information  Goals Addressed            This Visit's Progress   . Manage My Emotions       Timeframe:  Long-Range Goal Priority:  High Start Date:  02/16/20                           Expected End Date: 08/17/19                     Follow Up Date 05/09/2020    - continue to consider beginning personal counseling - talk about feelings with a friend, family or spiritual advisor - practice positive thinking and self-talk  -follow up with specialist of choice, as recommended    Why is this important?    When you are stressed, down or upset, your body reacts too.   For example, your blood pressure may get higher; you may have a headache or stomachache.   When your emotions get the best of you, your body's ability to fight off cold and flu gets weak.   These steps will help you manage your emotions.     Notes:        The patient verbalized understanding of instructions, educational materials, and care plan provided today and declined offer to receive copy of patient instructions, educational materials, and care plan.   Telephone follow up appointment with care management team member scheduled for:05/09/20    Elliot Gurney, [place] Worker  [facility] [phone]

## 2020-04-25 NOTE — Chronic Care Management (AMB) (Signed)
Chronic Care Management    Clinical Social Work Note  04/25/2020 Name: [name] MRN: [id number] DOB: Sep 18, 1945  [name] is a 75 y.o. year old female who is a primary care patient of [name], [name], MD. The CCM team was consulted to assist the patient with chronic disease management and/or care coordination needs related to: Mental Health Counseling and Resources.   Engaged with patient by telephone for follow up visit in response to provider referral for social work chronic care management and care coordination services.   Consent to Services:  The patient was given information about Chronic Care Management services, agreed to services, and gave verbal consent prior to initiation of services.  Please see initial visit note for detailed documentation.   Patient agreed to services and consent obtained.   Assessment: Review of patient past medical history, allergies, medications, and health status, including review of relevant consultants reports was performed today as part of a comprehensive evaluation and provision of chronic care management and care coordination services.     SDOH (Social Determinants of Health) assessments and interventions performed:    Advanced Directives Status: Not addressed in this encounter.  CCM Care Plan  Allergies  Allergen Reactions  . Levofloxacin     Tongue and mouth swelling  . Calcium Channel Blockers     Other reaction(s): Dizziness  . Amoxicillin Other (See Comments)    Causes yeast infection Has patient had a PCN reaction causing immediate rash, facial/tongue/throat swelling, SOB or lightheadedness with hypotension: No Has patient had a PCN reaction causing severe rash involving mucus membranes or skin necrosis: No Has patient had a PCN reaction that required hospitalization: No Has patient had a PCN reaction occurring within the last 10 years: No If all of the above answers are "NO", then may proceed with Cephalosporin use.   .  Nickel Rash  . Penicillins Other (See Comments)    Causes yeast infection Has patient had a PCN reaction causing immediate rash, facial/tongue/throat swelling, SOB or lightheadedness with hypotension: No Has patient had a PCN reaction causing severe rash involving mucus membranes or skin necrosis: No Has patient had a PCN reaction that required hospitalization: No Has patient had a PCN reaction occurring within the last 10 years: No If all of the above answers are "NO", then may proceed with Cephalosporin use.    Outpatient Encounter Medications as of 04/25/2020  Medication Sig  . albuterol (VENTOLIN HFA) 108 (90 Base) MCG/ACT inhaler TAKE 2 PUFFS BY MOUTH EVERY 6 HOURS AS NEEDED FOR WHEEZE OR SHORTNESS OF BREATH  . ALPRAZolam (XANAX) 0.5 MG tablet Take 1 tablet (0.5 mg total) by mouth 3 (three) times daily as needed for anxiety or sleep.  [name] aspirin EC 81 MG tablet Take 81 mg by mouth daily.  . citalopram (CELEXA) 10 MG tablet Take 1 tablet (10 mg total) by mouth daily.  . fluticasone (FLONASE) 50 MCG/ACT nasal spray PLACE 1-2 SPRAYS INTO BOTH NOSTRILS DAILY AS NEEDED FOR ALLERGIES OR RHINITIS.  . HYDROcodone-acetaminophen (NORCO/VICODIN) 5-325 MG tablet Take 1 tablet by mouth every 4 (four) hours as needed for moderate pain.  [name] ibuprofen (ADVIL) 800 MG tablet Take 1 tablet (800 mg total) by mouth daily as needed (severe pain).  [name] omeprazole (PRILOSEC) 20 MG capsule Take 20 mg by mouth daily as needed (acid reflux).    No facility-administered encounter medications on file as of 04/25/2020.    Patient Active Problem List   Diagnosis Date Noted  .  Recurrent syncope 04/14/2020  . Palpitations 04/14/2020  . Pure hypercholesterolemia 04/14/2020  . Small bowel obstruction ([place])   . Partial small bowel obstruction ([place]) 02/04/2018  . SBO (small bowel obstruction) ([place]) 03/13/2017  . Panic disorder 02/21/2016  . Weakness of both lower extremities 02/21/2016  . Depression 02/21/2016  .  Problems with swallowing and mastication   . Stricture and stenosis of esophagus   . 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 Acid reflux 10/20/2014  . Hx of colonic polyps   . Benign neoplasm of transverse colon   . Diverticulosis of large intestine without diverticulitis   . Allergic rhinitis 08/01/2014  . Anxiety 08/01/2014  . HLD (hyperlipidemia) 08/01/2014  . Essential hypertension 08/01/2014  . Vitamin D deficiency 08/01/2014  . H/O adenomatous polyp of colon 01/22/2012    Conditions to be addressed/monitored: Anxiety and Depression; Mental Health Concerns   Care Plan : Depression (Adult)  Updates made by Vern Claude, LCSW since 04/25/2020 12:00 AM    Problem: Depression Identification (Depression)     Long-Range Goal: Depressive Symptoms Identified   Start Date: 02/16/2020  Expected End Date: 08/16/2020  Recent Progress: On track  Priority: High  Note:   Current Barriers:  [name] Mental Health Concerns  . Difficulty   Clinical Social Work Clinical Goal(s):  [name] Over the next 90 days, patient will work with SW to address concerns related to identifying coping skills to manager her depression as well as connection her to a local mental health provider for ongoing mental health follow up   Interventions: . 1:1 collaboration with [name], [name], MD regarding development and update of comprehensive plan of care as evidenced by provider attestation and co-signature . Inter-disciplinary care team collaboration (see longitudinal plan of care) . Patient interviewed and appropriate assessments performed . Patient discussed having some medical set backs and possible concerns with her heart . Followed up with a Cardiologist recommended, she is now with a new Cardiologist and they will be doing more tests on 3/10 and 3/23-patient initially hesitant to get test done but has now agreed . Collaboration phone call to [name] to explore costs of needed test-patient will be responsible for  20% . Provided patient with emotional support during this time, allowing her to vent her frustrations and verbalize her fears . Self care continued to be emphasized, positive coping strategies explored, close follow up with her specialist recommended  Patient Goals/Self-Care Activities Over the next 90 days, patient will:   - Patient will attend all scheduled provider appointments Patient will attend church or other social activities - consider beginning personal counseling - talk about feelings with a friend, family or spiritual advisor - practice positive thinking and self-talk   Follow up Plan: SW will follow up with patient by phone over the next 14 business days        Follow Up Plan: SW will follow up with patient by phone over the next 05/09/20       Elliot Gurney, [place] Worker  [facility] [phone]

## 2020-04-26 NOTE — Telephone Encounter (Signed)
Spoke to [facility] receptionist. No record of where a monitor was ever placed, no nurse visit where one was placed or ordered. Suggested the patient may not have turned in the monitor.   Called patient back. States she placed the monitor in a drop box near [facility].  [place] must not have received it yet.   Patient would like to go ahead and wear the monitor from our office. Her social working spoke to her insurance company for her and they would cover a portion of monitor. She has echo tomorrow and will plan to have the monitor placed tomorrow afterwards. Appt scheduled.

## 2020-04-27 ENCOUNTER — Other Ambulatory Visit: Payer: Self-pay

## 2020-04-27 ENCOUNTER — Ambulatory Visit: Payer: Medicare Other

## 2020-04-27 ENCOUNTER — Ambulatory Visit (INDEPENDENT_AMBULATORY_CARE_PROVIDER_SITE_OTHER): Payer: Medicare Other

## 2020-04-27 DIAGNOSIS — R55 Syncope and collapse: Secondary | ICD-10-CM

## 2020-04-27 LAB — ECHOCARDIOGRAM COMPLETE
Area-P 1/2: 3.12 cm2
P 1/2 time: 618 msec
S' Lateral: 2.1 cm

## 2020-04-28 DIAGNOSIS — H10239 Serous conjunctivitis, except viral, unspecified eye: Secondary | ICD-10-CM | POA: Diagnosis not present

## 2020-04-28 DIAGNOSIS — H2513 Age-related nuclear cataract, bilateral: Secondary | ICD-10-CM | POA: Diagnosis not present

## 2020-05-02 NOTE — Telephone Encounter (Signed)
Patient is returning call. Please advise? 

## 2020-05-02 NOTE — Telephone Encounter (Signed)
Results of echo given to patient and she verbalized understanding.

## 2020-05-09 ENCOUNTER — Ambulatory Visit: Payer: Self-pay | Admitting: *Deleted

## 2020-05-09 DIAGNOSIS — F329 Major depressive disorder, single episode, unspecified: Secondary | ICD-10-CM | POA: Diagnosis not present

## 2020-05-09 DIAGNOSIS — F419 Anxiety disorder, unspecified: Secondary | ICD-10-CM

## 2020-05-09 NOTE — Chronic Care Management (AMB) (Signed)
Chronic Care Management    Clinical Social Work Note  05/09/2020 Name: [name] MRN: [id number] DOB: 1945-03-10  [name] is a 75 y.o. year old female who is a primary care patient of [name], [name], MD. The CCM team was consulted to assist the patient with chronic disease management and/or care coordination needs related to: Mental Health Counseling and Resources.   Engaged with patient by telephone for follow up visit in response to provider referral for social work chronic care management and care coordination services.   Consent to Services:  The patient was given information about Chronic Care Management services, agreed to services, and gave verbal consent prior to initiation of services.  Please see initial visit note for detailed documentation.   Patient agreed to services and consent obtained.   Assessment: Review of patient past medical history, allergies, medications, and health status, including review of relevant consultants reports was performed today as part of a comprehensive evaluation and provision of chronic care management and care coordination services.     SDOH (Social Determinants of Health) assessments and interventions performed:    Advanced Directives Status: Not addressed in this encounter.  CCM Care Plan  Allergies  Allergen Reactions  . Levofloxacin     Tongue and mouth swelling  . Calcium Channel Blockers     Other reaction(s): Dizziness  . Amoxicillin Other (See Comments)    Causes yeast infection Has patient had a PCN reaction causing immediate rash, facial/tongue/throat swelling, SOB or lightheadedness with hypotension: No Has patient had a PCN reaction causing severe rash involving mucus membranes or skin necrosis: No Has patient had a PCN reaction that required hospitalization: No Has patient had a PCN reaction occurring within the last 10 years: No If all of the above answers are "NO", then may proceed with Cephalosporin use.   .  Nickel Rash  . Penicillins Other (See Comments)    Causes yeast infection Has patient had a PCN reaction causing immediate rash, facial/tongue/throat swelling, SOB or lightheadedness with hypotension: No Has patient had a PCN reaction causing severe rash involving mucus membranes or skin necrosis: No Has patient had a PCN reaction that required hospitalization: No Has patient had a PCN reaction occurring within the last 10 years: No If all of the above answers are "NO", then may proceed with Cephalosporin use.    Outpatient Encounter Medications as of 05/09/2020  Medication Sig  . albuterol (VENTOLIN HFA) 108 (90 Base) MCG/ACT inhaler TAKE 2 PUFFS BY MOUTH EVERY 6 HOURS AS NEEDED FOR WHEEZE OR SHORTNESS OF BREATH  . ALPRAZolam (XANAX) 0.5 MG tablet Take 1 tablet (0.5 mg total) by mouth 3 (three) times daily as needed for anxiety or sleep.  [name] aspirin EC 81 MG tablet Take 81 mg by mouth daily.  . citalopram (CELEXA) 10 MG tablet Take 1 tablet (10 mg total) by mouth daily.  . fluticasone (FLONASE) 50 MCG/ACT nasal spray PLACE 1-2 SPRAYS INTO BOTH NOSTRILS DAILY AS NEEDED FOR ALLERGIES OR RHINITIS.  . HYDROcodone-acetaminophen (NORCO/VICODIN) 5-325 MG tablet Take 1 tablet by mouth every 4 (four) hours as needed for moderate pain.  [name] ibuprofen (ADVIL) 800 MG tablet Take 1 tablet (800 mg total) by mouth daily as needed (severe pain).  [name] omeprazole (PRILOSEC) 20 MG capsule Take 20 mg by mouth daily as needed (acid reflux).    No facility-administered encounter medications on file as of 05/09/2020.    Patient Active Problem List   Diagnosis Date Noted  .  Recurrent syncope 04/14/2020  . Palpitations 04/14/2020  . Pure hypercholesterolemia 04/14/2020  . Small bowel obstruction ([place])   . Partial small bowel obstruction ([place]) 02/04/2018  . SBO (small bowel obstruction) ([place]) 03/13/2017  . Panic disorder 02/21/2016  . Weakness of both lower extremities 02/21/2016  . Depression 02/21/2016  .  Problems with swallowing and mastication   . Stricture and stenosis of esophagus   . 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 Acid reflux 10/20/2014  . Hx of colonic polyps   . Benign neoplasm of transverse colon   . Diverticulosis of large intestine without diverticulitis   . Allergic rhinitis 08/01/2014  . Anxiety 08/01/2014  . HLD (hyperlipidemia) 08/01/2014  . Essential hypertension 08/01/2014  . Vitamin D deficiency 08/01/2014  . H/O adenomatous polyp of colon 01/22/2012    Conditions to be addressed/monitored:  Mental Health Concerns   Care Plan : Depression (Adult)  Updates made by Vern Claude, LCSW since 05/09/2020 12:00 AM    Problem: Depression Identification (Depression)     Long-Range Goal: Depressive Symptoms Identified   Start Date: 02/16/2020  Expected End Date: 08/16/2020  Recent Progress: On track  Priority: High  Note:   Current Barriers:  [name] Mental Health Concerns  . Difficulty   Clinical Social Work Clinical Goal(s):  [name] Over the next 90 days, patient will work with SW to address concerns related to identifying coping skills to manager her depression as well as connection her to a local mental health provider for ongoing mental health follow up   Interventions: . 1:1 collaboration with [name], [name], MD regarding development and update of comprehensive plan of care as evidenced by provider attestation and co-signature . Inter-disciplinary care team collaboration (see longitudinal plan of care) . Patient interviewed and appropriate assessments performed . Patient discussed receiving results of her echo and states that it came back normal . Patient demonstrated increased insight into the affects of stress and anxiety on her physical health . Provided patient with emotional support and positive reinforcement regarding to increase insight shown in regards to her mental health . Self care continued to be emphasized, positive coping strategies explored, close follow  up with her specialist recommended  Patient Goals/Self-Care Activities Over the next 90 days, patient will:   - Patient will attend all scheduled provider appointments Patient will attend church or other social activities - consider beginning personal counseling - talk about feelings with a friend, family or spiritual advisor - practice positive thinking and self-talk   Follow up Plan: SW will follow up with patient by phone over the next 14 business days        Follow Up Plan: SW will follow up with patient by phone over the next 14 business days       [employer], [place] Worker  [facility] [phone]

## 2020-05-09 NOTE — Patient Instructions (Signed)
Visit Information  Goals Addressed            This Visit's Progress   . Manage My Emotions       Timeframe:  Long-Range Goal Priority:  High Start Date:  02/16/20                           Expected End Date: 08/17/19                     Follow Up Date 05/23/20   - continue to consider beginning personal counseling - talk about feelings with a friend, family or spiritual advisor - practice positive thinking and self-talk  -follow up with specialist of choice, as recommended    Why is this important?    When you are stressed, down or upset, your body reacts too.   For example, your blood pressure may get higher; you may have a headache or stomachache.   When your emotions get the best of you, your body's ability to fight off cold and flu gets weak.   These steps will help you manage your emotions.     Notes:        The patient verbalized understanding of instructions, educational materials, and care plan provided today and declined offer to receive copy of patient instructions, educational materials, and care plan.   Telephone follow up appointment with care management team member scheduled for:05/23/20  Elliot Gurney, [place] Worker  [facility] [phone]

## 2020-05-10 ENCOUNTER — Ambulatory Visit: Payer: Medicare Other | Admitting: Physician Assistant

## 2020-05-12 ENCOUNTER — Telehealth: Payer: Self-pay

## 2020-05-12 MED ORDER — ALPRAZOLAM 0.5 MG PO TABS: 0.5000 mg | ORAL_TABLET | Freq: Three times a day (TID) | ORAL | 3 refills | Status: DC | PRN

## 2020-05-12 MED ORDER — CITALOPRAM HYDROBROMIDE 10 MG PO TABS: 10.0000 mg | ORAL_TABLET | Freq: Every day | ORAL | 4 refills | Status: DC

## 2020-05-12 NOTE — Telephone Encounter (Signed)
Please advise. Thanks.  

## 2020-05-12 NOTE — Telephone Encounter (Signed)
Copied from [place] [phone]. Topic: Appointment Scheduling - Scheduling Inquiry for Clinic >> May 12, 2020 11:52 AM Greggory Keen D wrote: Reason for CRM: pt called asking for an appt with dr. [name] saying she brook down yesterday.  She is on xanax already.  She says she is taking citalopram 2 times a day instead of one and she has ran out.  She lost her recently and has had to up her medication.  She is asking if she can get early refills on the xanax and citaopram.  [place]  CB#  [phone]

## 2020-05-19 NOTE — Telephone Encounter (Signed)
Patient did not wear a monitor from our office. There was an error on order entered in our system. Patient did wear monitor for [facility] and results are in [place]. Patient saw [place] and then went to [facility]. We are unable to remove patient from our monitors list so it appears that one was placed.

## 2020-05-23 ENCOUNTER — Telehealth: Payer: Self-pay | Admitting: *Deleted

## 2020-05-23 ENCOUNTER — Ambulatory Visit: Payer: Medicare Other | Admitting: Physician Assistant

## 2020-05-23 ENCOUNTER — Encounter: Payer: Self-pay | Admitting: Physician Assistant

## 2020-05-23 ENCOUNTER — Other Ambulatory Visit: Payer: Self-pay

## 2020-05-23 VITALS — BP 128/80 | HR 77 | Ht 63.0 in | Wt 169.0 lb

## 2020-05-23 DIAGNOSIS — E7800 Pure hypercholesterolemia, unspecified: Secondary | ICD-10-CM

## 2020-05-23 DIAGNOSIS — R002 Palpitations: Secondary | ICD-10-CM

## 2020-05-23 DIAGNOSIS — I10 Essential (primary) hypertension: Secondary | ICD-10-CM

## 2020-05-23 DIAGNOSIS — R55 Syncope and collapse: Secondary | ICD-10-CM | POA: Diagnosis not present

## 2020-05-23 NOTE — Progress Notes (Signed)
Office Visit    Patient Name: [name] Date of Encounter: 05/23/2020  PCP:  Birdie Sons, [place]  Cardiologist:  Nelva Bush, MD  Advanced Practice Provider:  No care team member to display Electrophysiologist:  None   :[id number]}   Chief Complaint    Chief Complaint  Patient presents with  . Follow-up    F/U after echo    75 year old female with history of syncope, hypertension, hyperlipidemia, questionable stroke, GERD, partial SBO, back pain with paresthesias of lower extremities, and here today for follow-up of previous 04/12/2020 visit with review of echo.  Past Medical History    Past Medical History:  Diagnosis Date  . Anxiety   . Arthritis    lower spine  . Back pain    lower back - S/P lifting injury  . Complication of anesthesia    makes hair brittle  . GERD (gastroesophageal reflux disease)   . Hyperlipidemia   . Hypertension   . Neuromuscular disorder (HCC)    numbness legs and feet s/p lower back injury  . Partial bowel obstruction ([place]) 02/04/2018  . Stroke [facility])    "mini - strokes" 2009 - no deficit  . Vertigo    none recently  . Vitamin D deficiency   . Wears contact lenses    Past Surgical History:  Procedure Laterality Date  . ABDOMINAL HYSTERECTOMY  1987  . CARDIAC CATHETERIZATION  02/2007  . COLON SURGERY  03/12/2012   Dr Phylis Bougie  . COLONOSCOPY WITH PROPOFOL N/A 09/26/2014   Procedure: COLONOSCOPY WITH PROPOFOL;  Surgeon: Lucilla Lame, MD;  Location: [place];  Service: Endoscopy;  Laterality: N/A;  marker (tattoo) used in colon  . COLONOSCOPY WITH PROPOFOL N/A 10/27/2019   Procedure: COLONOSCOPY WITH PROPOFOL;  Surgeon: Robert Bellow, MD;  Location: [facility];  Service: Endoscopy;  Laterality: N/A;  . ESOPHAGEAL DILATION N/A 02/02/2016   Procedure: ESOPHAGEAL DILATION;  Surgeon: Lucilla Lame, MD;  Location: [place];  Service: Endoscopy;  Laterality: N/A;  .  ESOPHAGOGASTRODUODENOSCOPY (EGD) WITH PROPOFOL N/A 02/02/2016   Procedure: ESOPHAGOGASTRODUODENOSCOPY (EGD) WITH PROPOFOL;  Surgeon: Lucilla Lame, MD;  Location: [place];  Service: Endoscopy;  Laterality: N/A;  . LAPAROSCOPIC ABDOMINAL EXPLORATION N/A 11/08/2019   Procedure: LAPAROSCOPIC ABDOMINAL EXPLORATION;  Surgeon: Robert Bellow, MD;  Location: [facility];  Service: General;  Laterality: N/A;  POSSIBLE LAPAROTOMY  . POLYPECTOMY  09/26/2014   Procedure: POLYPECTOMY;  Surgeon: Lucilla Lame, MD;  Location: [place];  Service: Endoscopy;;  . [place]  . VENTRAL HERNIA REPAIR N/A 11/08/2019   Procedure: HERNIA REPAIR VENTRAL ADULT;  Surgeon: Robert Bellow, MD;  Location: [facility];  Service: General;  Laterality: N/A;  possible ventral hernia repair    Allergies  Allergies  Allergen Reactions  . Levofloxacin     Tongue and mouth swelling  . Calcium Channel Blockers     Other reaction(s): Dizziness  . Amoxicillin Other (See Comments)    Causes yeast infection Has patient had a PCN reaction causing immediate rash, facial/tongue/throat swelling, SOB or lightheadedness with hypotension: No Has patient had a PCN reaction causing severe rash involving mucus membranes or skin necrosis: No Has patient had a PCN reaction that required hospitalization: No Has patient had a PCN reaction occurring within the last 10 years: No If all of the above answers are "NO", then may proceed with Cephalosporin use.   . Nickel Rash  .  Penicillins Other (See Comments)    Causes yeast infection Has patient had a PCN reaction causing immediate rash, facial/tongue/throat swelling, SOB or lightheadedness with hypotension: No Has patient had a PCN reaction causing severe rash involving mucus membranes or skin necrosis: No Has patient had a PCN reaction that required hospitalization: No Has patient had a PCN reaction occurring within the last 10 years: No If all of the above  answers are "NO", then may proceed with Cephalosporin use.    History of Present Illness    [name] is a 75 y.o. female with PMH as above.  She has history of syncope, hypertension, hyperlipidemia, questionable stroke, GERD, partial SBO, back pain, and lower extremity paresthesias.  She initially established with [facility] cardiology 04/12/2020, at which time she saw Dr. Nelva Bush, MD.  She had previously been evaluated for syncope by [facility] cardiology/Dr. Nehemiah Massed; however, on review of EMR, she wished to transition to [facility].    She reports multiple episodes of syncope dating back to the 1970s-1980s today. Her syncopal episodes always occur with bowel movements, and she has attributed this to her prior bowel surgeries.  Every few years, she reports straining in the bathroom with subsequent presyncope/lightheaded feeling and syncopal episode.  Prior to syncopal events, she usually feels nauseated and diaphoretic.  She reports occasional racing heart rate prior to these episodes.  She usually wakes up on the floor with her husband's report that she has lost consciousness for at least a few seconds.  Due to her frequent syncopal episodes during bowel movements, she tries not to have any bowel movements when her husband or someone else is not around.  She reports that her bathroom does have areas that would be concerning if she were to fall and hit her head.  For example, directly in front of her is the bathtub, on which she could hit her head.   2010 MPI ([facility]) without evidence of ischemia or scar.  LVEF 72%.   2019 echo with normal EF, moderate AR, mild MR/TR.  She was evaluated by [facility] cardiology/Dr. Nehemiah Massed with  ambulatory cardiac monitoring x5 days.  Unfortunately, we are unable to view the actual CV strips associated with this cardiac monitoring.  On review of EMR, I am able to find a summary report of the ambulatory monitoring transcribed by Dr. Nehemiah Massed (see below) and with NSR and rare  episodes of brief SVT.  She reports that the complete cardiac monitoring report (CV strips included) is at home at this time.  Recommendation was to bring this report into the office for Dr. Saunders Revel review, as it was questionable if insurance would cover a second cardiac monitor at this time.    Her most recent syncopal episode occurred after constipation for 3 days, followed by a sudden need for bowel movement.  While attempting to then use restroom, she became nauseated and diaphoretic.  She felt as if she was able to pass a small amount of stool, but she then became extremely weak and was unable to stand.  She noted her heart racing.  After that time, she does not remember any events.  Her husband came to assist her at some point and saw she had passed out for a few seconds.  Ms. Wingler woke up on the floor after LOC and gradually regained her strength.    When she mentioned the above syncopal episode to Dr. Charissa Bash, GI specialist, recommendation was to present to the emergency department at [facility]. ED workup 04/02/2020  was unrevealing, and syncope was attributed to vasovagal etiology.   She established with Dr. Saunders Revel after that time. When seen 04/12/20, she reported transient chest pain for a few weeks leading up to her syncopal episode.  CP was described as " a little sharp" and would occur while lying in bed.  She wondered if the CP was 2/2 gas.  No exertional symptoms.  No shortness of breath or edema.  It was thought that her syncope was most consistent with a vasovagal etiology, given the syncopal episodes were always associated with abdominal discomfort, constipation, and defecation.  Use of a laxative was discussed to prevent constipation.  In addition, Ms. Zuver was encouraged to sit or lie down if she noted presyncope/early symptoms of possible upcoming syncopal episode (warmth, diaphoresis, nausea).  As above, recommendation was to reach out to Dr. Alveria Apley office for results of recent event monitor.   Repeat echo was recommended, given her history of valvular heart disease on prior echo from 2019.  She was advised to avoid driving or operating heavy machinery pending completion of her cardiac work-up and up to 6 months from the time of her most recent syncopal episode.  Hydration was encouraged.  EKG was without ectopy or arrhythmia.  BP was noted to be at the upper limits of normal with medication changes deferred.  Given elevated LDL of 189, it was recommended her PCP repeat a lipid panel.  It was noted further recommendations regarding ischemic evaluation or lipid therapy were pending echo results.  04/27/2020 echo showed EF 60 to 65%, NRWMA, mild LVH, G1 DD, mild MR.  She was updated that the echo was without findings that would explain her syncopal episodes.  Today, 05/23/2020, she returns to clinic for review of her echo.  We again reviewed her long history of syncopal episodes during bowel movements, dating back to the 1970s per patient.  Back in the 1970s, she used to put a towel over her head, which she felt would help her symptoms.  She reports she is usually only able to pass a small stool before becoming sick to her stomach.  She usually wants to lay down at that time, given her nausea.  She attempts to stand up from the toilet (to go lie down), at which point her legs give out/she becomes weak with subsequent syncopal episode.    Today, she denies CP or further sharp CP with lying down. No SOB at rest.  No DOE, though she does note an increased sedentary lifestyle with recent attempt to restart walking as of 2 weeks ago.  No signs or symptoms of volume overload.  No signs or symptoms of bleeding.  She reports medication compliance.  Since her last visit with Dr. Saunders Revel 2/23, she reports an additional syncopal episode. Syncope again occurred during an attempted bowel movement.  She remembers feeling warm and nauseated, after which time she again tried to stand and was then found by her husband with  estimated LOC her husband "only seconds."  Prior to her syncopal episode, no palpitations, chest pain, or shortness of breath.  She did not hit her head.    She reports that Dr. Bary Castilla has advised her not to use any pressure during her bowel movements, therefore she has not been bearing down during her bowel movements.  Stool softeners again discussed today.  She discussed that she feels as if she struggles to have a bowel movement and her bowels build and build until she feels as if they  are pressing up into her chest.  Eventually, she does have a successful bowel movement, during which time she moves a small amount of stool with syncope as described above.  Home Medications    Current Outpatient Medications on File Prior to Visit  Medication Sig Dispense Refill  . albuterol (VENTOLIN HFA) 108 (90 Base) MCG/ACT inhaler TAKE 2 PUFFS BY MOUTH EVERY 6 HOURS AS NEEDED FOR WHEEZE OR SHORTNESS OF BREATH 8.5 g 2  . ALPRAZolam (XANAX) 0.5 MG tablet Take 1 tablet (0.5 mg total) by mouth 3 (three) times daily as needed for anxiety or sleep. 90 tablet 3  . aspirin EC 81 MG tablet Take 81 mg by mouth daily.    . citalopram (CELEXA) 10 MG tablet Take 1 tablet (10 mg total) by mouth daily. 90 tablet 4  . fluticasone (FLONASE) 50 MCG/ACT nasal spray PLACE 1-2 SPRAYS INTO BOTH NOSTRILS DAILY AS NEEDED FOR ALLERGIES OR RHINITIS. 48 mL 1  . HYDROcodone-acetaminophen (NORCO/VICODIN) 5-325 MG tablet Take 1 tablet by mouth every 4 (four) hours as needed for moderate pain. 20 tablet 0  . ibuprofen (ADVIL) 800 MG tablet Take 1 tablet (800 mg total) by mouth daily as needed (severe pain). 30 tablet 1  . omeprazole (PRILOSEC) 20 MG capsule Take 20 mg by mouth daily as needed (acid reflux).      No current facility-administered medications on file prior to visit.    Review of Systems    She reports an additional episode of syncope during a bowel movement and after her most recent 04/12/2020 visit.  Syncopal episodes  are preceded by nausea and warmth and always occur with bowel movements.  She did not hit her head.  She denies further chest pain. No palpitations, dyspnea, pnd, orthopnea, v, edema, weight gain, or early satiety.  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    all other systems reviewed and are otherwise negative except as noted above.  Physical Exam    VS:  BP 140/90 (BP Location: Left Arm, Patient Position: Sitting, Cuff Size: Normal)   Pulse 77   Ht 5\' 3"  (1.6 m)   Wt 169 lb (76.7 kg)   SpO2 97%   BMI 29.94 kg/m  , BMI Body mass index is 29.94 kg/m. GEN: Well nourished, well developed, in no acute distress. HEENT: normal. Neck: Supple, no JVD, carotid bruits, or masses. Cardiac: RRR, no murmurs, rubs, or gallops. No clubbing, cyanosis, edema.  Radials/DP/PT 2+ and equal bilaterally.  Respiratory:  Respirations regular and unlabored, clear to auscultation bilaterally. GI: Soft, nontender, nondistended, BS + x 4. MS: no deformity or atrophy. Skin: warm and dry, no rash. Neuro:  Strength and sensation are intact. Psych: Normal affect.  Accessory Clinical Findings    ECG personally reviewed by me today - No EKG - no acute changes.  VITALS Reviewed today   Temp Readings from Last 3 Encounters:  04/02/20 97.9 F (36.6 C) (Oral)  01/25/20 98.3 F (36.8 C) (Oral)  11/10/19 98.4 F (36.9 C) (Oral)   BP Readings from Last 3 Encounters:  05/23/20 140/90  04/12/20 126/84  04/02/20 119/77   Pulse Readings from Last 3 Encounters:  05/23/20 77  04/12/20 77  04/02/20 (!) 58    Wt Readings from Last 3 Encounters:  05/23/20 169 lb (76.7 kg)  04/12/20 169 lb (76.7 kg)  04/02/20 171 lb (77.6 kg)  LABS  reviewed today   Lab Results  Component Value Date   WBC 6.4 04/02/2020   HGB 15.7 (H) 04/02/2020   HCT 45.1 04/02/2020   MCV 89.0 04/02/2020   PLT 203  04/02/2020   Lab Results  Component Value Date   CREATININE 0.80 04/02/2020   BUN 17 04/02/2020   NA 136 04/02/2020   K 3.3 (L) 04/02/2020   CL 106 04/02/2020   CO2 20 (L) 04/02/2020   Lab Results  Component Value Date   ALT 14 09/23/2019   AST 20 09/23/2019   ALKPHOS 58 09/23/2019   BILITOT 1.0 09/23/2019   Lab Results  Component Value Date   CHOL 261 (H) 10/01/2017   HDL 59 10/01/2017   LDLCALC 189 (H) 10/01/2017   LDLDIRECT 217 (H) 04/29/2017   TRIG 67 10/01/2017   CHOLHDL 4.4 10/01/2017    Lab Results  Component Value Date   HGBA1C 5.5 01/25/2020   Lab Results  Component Value Date   TSH 1.460 08/11/2018     STUDIES/PROCEDURES reviewed today   04/2020 Monitor Ambulatory cardiac monitoring/[facility] cardiology Baseline normal sinus rhythm with maximum heart rate of 167 bpm, minimum 53 bpm, average 68 bpm.  Rare PACs and PVCs.  Heart rate variable, depending on activity levels, and with rare SVT of 3, 4, 6, 5, 3, 4 beats without evidence of apparent symptoms.  No significant progression of symptomatic bradycardia or advanced heart block or cause of syncope.  Resulted 05/04/2020 by Dr. Nehemiah Massed  05/2017 Echo / [place] LEFT VENTRICULAR SYSTOLIC FUNCTION WITH MILD LVH  NORMAL RIGHT VENTRICULAR SYSTOLIC FUNCTION  MODERATE VALVULAR REGURGITATION (See above)  NO VALVULAR STENOSIS  MODERATE AR  MILD MR, TR  EF 60%   11/2008 MPI/ [facility] EKG Interpretation   : Negative for ST segment changes Additional Comments  : The post stress LVEF, by quantitative gated               SPECT imaging, is 72% with normal wall motion.    Assessment & Plan    Recurrent syncope --Additional episode of syncope reported since her previous 04/12/2020 visit and consistent with previous episodes, as this episode again occurred during a bowel movement.  As previously noted, symptoms most consistent with vasovagal etiology.  She reports associated abdominal  distention comfort, constipation, and defecation.  Preceding symptoms usually include nausea and warmth.  She reports a long history of syncope during bowel movements, dating back to the 1970s.  We again discussed precautions to prevent further syncopal episodes and possibly hitting her head in the bathroom.  She reports she always has someone with her at the time of bowel movements. It was noted she tries to stand when she feels a syncopal episode might be coming soon. I encouraged Ms. Bertholf to instead slowly lower herself to the ground, rather than stand. She should lower to the ground (slowly) if any symptoms felt that correspond with those usually felt before her syncopal episodes.  We discussed that slow position changes made close to the ground or recommended going forward.  Agree with having someone with her at the time of her bowel movements.  We discussed that I am unable to see the CV strips associated with her cardiac monitoring.  She reports that she has these at home and will bring them into the office.  We will continue to defer ordered but not yet scheduled cardiac monitor by our office, as previously discussed, we are uncertain if this  will be covered after a recent cardiac monitor by Dr. Nehemiah Massed.  Most recent echo was reviewed with EF normal, NRWMA, and no significant valvular abnormalities.  Recommend repeat cardiac monitoring and for a longer length of time if unable to obtain CD strips associated with previous ambulatory monitoring.  Given EF normal/NRWMA on echo, as well as resolution of CP and no sx reported consistent with angina, will defer repeat MPI at this time.  Reassess at RTC.  Again discussed avoiding driving or operating heavy machinery until completion of work-up/6 months out from her most recent syncopal episode.  Hydration encouraged.  Also encouraged was that she reach out again to GI and further discuss stool softeners, as she continues to struggle with periodic constipation.    Palpitations  --Denies any further palpitations.  As above, she will bring the official cardiac monitoring results into the office.  Transcribed summary of cardiac monitoring by Dr. Nehemiah Massed as above.  Hypertension, goal BP 130/80 or lower --BP elevated at start of visit but consistent with previous clinic visit at recheck.  Discussed medication changes today, given BP was at the upper limits of normal at her previous clinic visit with agreement to defer medication changes at this time and reassess at RTC.  Reviewed salt and fluid restrictions to prevent labile BP. Encouraged to monitor BP at home.  HLD --09/2017 LDL 189.  Again reviewed this with patient with recommendation for repeat lipid and liver function this year and initiation of statin if indicated.  Given no repeat lipid and liver function at this time, will repeat lipid and liver today for further risk factor stratification.  Disposition: RTC 3 months /follow-up with Dr. Saunders Revel in 3 months (bring monitor results into the office)  Arvil Chaco, PA-C 05/23/2020

## 2020-05-23 NOTE — Patient Instructions (Signed)
Medication Instructions:   1. Your physician recommends that you continue on your current medications as directed. Please refer to the Current Medication list given to you today.  *If you need a refill on your cardiac medications before your next appointment, please call your pharmacy*   Lab Work:  1. Your physician recommends that you have lab work today - LIVER / LIPID  If you have labs (blood work) drawn today and your tests are completely normal, you will receive your results only by: [name] MyChart Message (if you have MyChart) OR . A paper copy in the mail If you have any lab test that is abnormal or we need to change your treatment, we will call you to review the results.   Testing/Procedures:  1. None Ordered   Follow-Up: At [facility], you and your health needs are our priority.  As part of our continuing mission to provide you with exceptional heart care, we have created designated Provider Care Teams.  These Care Teams include your primary Cardiologist (physician) and Advanced Practice Providers (APPs -  Physician Assistants and Nurse Practitioners) who all work together to provide you with the care you need, when you need it.  We recommend signing up for the patient portal called "MyChart".  Sign up information is provided on this After Visit Summary.  MyChart is used to connect with patients for Virtual Visits (Telemedicine).  Patients are able to view lab/test results, encounter notes, upcoming appointments, etc.  Non-urgent messages can be sent to your provider as well.   To learn more about what you can do with MyChart, go to [email or web address].    Your next appointment:   3 month(s)  The format for your next appointment:   In Person  Provider:   Nelva Bush, MD   Other Instructions - Please monitor Blood pressure at home.  - If your blood pressure is consistently over 130/80 please call.

## 2020-05-23 NOTE — Telephone Encounter (Signed)
   05/23/2020  MAHA FISCHEL 01/09/1946 [id number]  Phone call to patient to re-schedule phone visit. Visit re-schecheduled for 05/23/20 at 10 am.     Elliot Gurney, [place] Worker  [facility] [phone]

## 2020-05-23 NOTE — Telephone Encounter (Signed)
   Chronic Care Management   Outreach Note  05/23/2020 Name: [name] MRN: [id number] DOB: [birth year]  Referred by: Birdie Sons, MD Reason for referral : Chronic Care Management   An unsuccessful telephone outreach was attempted today. The patient was referred to the case management team for assistance with care management and care coordination.   Follow Up Plan: Telephone follow up appointment with care management team member scheduled for: 05/31/20   Elliot Gurney, [place] Worker  [facility] Management [phone]

## 2020-05-24 ENCOUNTER — Ambulatory Visit (INDEPENDENT_AMBULATORY_CARE_PROVIDER_SITE_OTHER): Payer: Medicare Other | Admitting: *Deleted

## 2020-05-24 DIAGNOSIS — F329 Major depressive disorder, single episode, unspecified: Secondary | ICD-10-CM | POA: Diagnosis not present

## 2020-05-24 DIAGNOSIS — F419 Anxiety disorder, unspecified: Secondary | ICD-10-CM

## 2020-05-24 LAB — LIPID PANEL
Chol/HDL Ratio: 4.7 ratio — ABNORMAL HIGH (ref 0.0–4.4)
Cholesterol, Total: 225 mg/dL — ABNORMAL HIGH (ref 100–199)
HDL: 48 mg/dL (ref >39)
LDL Chol Calc (NIH): 165 mg/dL — ABNORMAL HIGH (ref 0–99)
Triglycerides: 71 mg/dL (ref 0–149)
VLDL Cholesterol Cal: 12 mg/dL (ref 5–40)

## 2020-05-24 LAB — HEPATIC FUNCTION PANEL
ALT: 17 IU/L (ref 0–32)
AST: 21 IU/L (ref 0–40)
Albumin: 4.2 g/dL (ref 3.7–4.7)
Alkaline Phosphatase: 79 IU/L (ref 44–121)
Bilirubin Total: 1 mg/dL (ref 0.0–1.2)
Bilirubin, Direct: 0.22 mg/dL (ref 0.00–0.40)
Total Protein: 6.8 g/dL (ref 6.0–8.5)

## 2020-05-24 NOTE — Chronic Care Management (AMB) (Signed)
Chronic Care Management    Clinical Social Work Note  05/24/2020 Name: [name] MRN: [id number] DOB: Sep 18, 1945  [name] is a 75 y.o. year old female who is a primary care patient of [name], Wanda Peri, MD. The CCM team was consulted to assist the patient with chronic disease management and/or care coordination needs related to: Mental Health Counseling and Resources.   Engaged with patient by telephone for follow up visit in response to provider referral for social work chronic care management and care coordination services.   Consent to Services:  The patient was given information about Chronic Care Management services, agreed to services, and gave verbal consent prior to initiation of services.  Please see initial visit note for detailed documentation.   Patient agreed to services and consent obtained.   Assessment: Review of patient past medical history, allergies, medications, and health status, including review of relevant consultants reports was performed today as part of a comprehensive evaluation and provision of chronic care management and care coordination services.     SDOH (Social Determinants of Health) assessments and interventions performed:    Advanced Directives Status: Not addressed in this encounter.  CCM Care Plan  Allergies  Allergen Reactions  . Levofloxacin     Tongue and mouth swelling  . Calcium Channel Blockers     Other reaction(s): Dizziness  . Amoxicillin Other (See Comments)    Causes yeast infection Has patient had a PCN reaction causing immediate rash, facial/tongue/throat swelling, SOB or lightheadedness with hypotension: No Has patient had a PCN reaction causing severe rash involving mucus membranes or skin necrosis: No Has patient had a PCN reaction that required hospitalization: No Has patient had a PCN reaction occurring within the last 10 years: No If all of the above answers are "NO", then may proceed with Cephalosporin use.   .  Nickel Rash  . Penicillins Other (See Comments)    Causes yeast infection Has patient had a PCN reaction causing immediate rash, facial/tongue/throat swelling, SOB or lightheadedness with hypotension: No Has patient had a PCN reaction causing severe rash involving mucus membranes or skin necrosis: No Has patient had a PCN reaction that required hospitalization: No Has patient had a PCN reaction occurring within the last 10 years: No If all of the above answers are "NO", then may proceed with Cephalosporin use.    Outpatient Encounter Medications as of 05/24/2020  Medication Sig  . albuterol (VENTOLIN HFA) 108 (90 Base) MCG/ACT inhaler TAKE 2 PUFFS BY MOUTH EVERY 6 HOURS AS NEEDED FOR WHEEZE OR SHORTNESS OF BREATH  . ALPRAZolam (XANAX) 0.5 MG tablet Take 1 tablet (0.5 mg total) by mouth 3 (three) times daily as needed for anxiety or sleep.  [name] aspirin EC 81 MG tablet Take 81 mg by mouth daily.  . citalopram (CELEXA) 10 MG tablet Take 1 tablet (10 mg total) by mouth daily.  . fluticasone (FLONASE) 50 MCG/ACT nasal spray PLACE 1-2 SPRAYS INTO BOTH NOSTRILS DAILY AS NEEDED FOR ALLERGIES OR RHINITIS.  . HYDROcodone-acetaminophen (NORCO/VICODIN) 5-325 MG tablet Take 1 tablet by mouth every 4 (four) hours as needed for moderate pain.  [name] ibuprofen (ADVIL) 800 MG tablet Take 1 tablet (800 mg total) by mouth daily as needed (severe pain).  [name] omeprazole (PRILOSEC) 20 MG capsule Take 20 mg by mouth daily as needed (acid reflux).    No facility-administered encounter medications on file as of 05/24/2020.    Patient Active Problem List   Diagnosis Date Noted  .  Recurrent syncope 04/14/2020  . Palpitations 04/14/2020  . Pure hypercholesterolemia 04/14/2020  . Small bowel obstruction ([place])   . Partial small bowel obstruction ([place]) 02/04/2018  . SBO (small bowel obstruction) ([place]) 03/13/2017  . Panic disorder 02/21/2016  . Weakness of both lower extremities 02/21/2016  . Depression 02/21/2016  .  Problems with swallowing and mastication   . Stricture and stenosis of esophagus   . 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 Acid reflux 10/20/2014  . Hx of colonic polyps   . Benign neoplasm of transverse colon   . Diverticulosis of large intestine without diverticulitis   . Allergic rhinitis 08/01/2014  . Anxiety 08/01/2014  . HLD (hyperlipidemia) 08/01/2014  . Essential hypertension 08/01/2014  . Vitamin D deficiency 08/01/2014  . H/O adenomatous polyp of colon 01/22/2012    Conditions to be addressed/monitored:  Mental Health Concerns   Care Plan : Depression (Adult)  Updates made by Vern Claude, LCSW since 05/24/2020 12:00 AM    Problem: Depression Identification (Depression)     Long-Range Goal: Depressive Symptoms Identified   Start Date: 02/16/2020  Expected End Date: 08/16/2020  Recent Progress: On track  Priority: High  Note:   Current Barriers:  [name] Mental Health Concerns  . Difficulty   Clinical Social Work Clinical Goal(s):  [name] Over the next 90 days, patient will work with SW to address concerns related to identifying coping skills to manager her depression as well as connection her to a local mental health provider for ongoing mental health follow up   Interventions: . 1:1 collaboration with [name], Wanda Peri, MD regarding development and update of comprehensive plan of care as evidenced by provider attestation and co-signature . Inter-disciplinary care team collaboration (see longitudinal plan of care) . Patient interviewed and appropriate assessments performed . Patient discussed having increased anxiety related to family stress and issues . Discussed having issues with overload and setting boundaries with family and friends . Patient demonstrated increased insight into the affects of stress and anxiety on her physical health, however continues to struggle with setting clear limits . Provided patient with emotional support and positive reinforcement regarding to  increase insight shown in regards to her mental health . Self care continued to be emphasized, positive coping strategies explored, follow up with a psychiatrist recommended  Patient Goals/Self-Care Activities Over the next 90 days, patient will:   - Patient will attend all scheduled provider appointments Patient will attend church or other social activities - consider beginning personal counseling - talk about feelings with a friend, family or spiritual advisor - practice positive thinking and self-talk   Follow up Plan: SW will follow up with patient by phone over the next 14 business days        Follow Up Plan: SW will follow up with patient by phone over the next 14 business days       [employer], [place] Worker  [facility] [phone]

## 2020-05-24 NOTE — Patient Instructions (Signed)
Visit Information  Goals Addressed            This Visit's Progress   . Manage My Emotions       Timeframe:  Long-Range Goal Priority:  High Start Date:  02/16/20                           Expected End Date: 08/17/19                     Follow Up Date 06/07/20   - continue to consider beginning personal counseling - talk about feelings with a friend, family or spiritual advisor - practice positive thinking and self-talk  -follow up with specialist of choice, as recommended    Why is this important?    When you are stressed, down or upset, your body reacts too.   For example, your blood pressure may get higher; you may have a headache or stomachache.   When your emotions get the best of you, your body's ability to fight off cold and flu gets weak.   These steps will help you manage your emotions.     Notes:        The patient verbalized understanding of instructions, educational materials, and care plan provided today and declined offer to receive copy of patient instructions, educational materials, and care plan.   Telephone follow up appointment with care management team member scheduled for: 05/2020    Elliot Gurney, [place] Worker  [facility] [phone]

## 2020-05-25 ENCOUNTER — Telehealth: Payer: Self-pay | Admitting: *Deleted

## 2020-05-25 DIAGNOSIS — Z79899 Other long term (current) drug therapy: Secondary | ICD-10-CM

## 2020-05-25 DIAGNOSIS — E785 Hyperlipidemia, unspecified: Secondary | ICD-10-CM

## 2020-05-25 MED ORDER — ROSUVASTATIN CALCIUM 40 MG PO TABS: 40.0000 mg | ORAL_TABLET | Freq: Every day | ORAL | 3 refills | Status: DC

## 2020-05-25 NOTE — Telephone Encounter (Signed)
Spoke to pt, notified of lab results and provider's recc.  Pt verbalized understanding. She does state that she is agreeable to start Crestor 40mg  daily.  [facility]  Pt asked for recommendation for heart healthy diet in addition to starting statin.  Mailed letter to pt with heart healthy diet information as well as reminder for lab work in 6-8 weeks.   Pt appreciative and has no further questions.

## 2020-05-25 NOTE — Telephone Encounter (Signed)
Attempted to call pt with lab results and provider's recc.  Pt did not answer. Lmtcb.

## 2020-05-25 NOTE — Telephone Encounter (Signed)
-----   Message from Arvil Chaco, PA-C sent at 05/24/2020  4:18 PM EDT ----- Labs show --Cholesterol high with total cholesterol 225, LDL 165. --Liver function stable / normal.  Recommendations: --Start Crestor 40mg  daily, if agreeable. --Repeat lipid and liver function in 6-8 weeks, if start statin.

## 2020-05-30 ENCOUNTER — Other Ambulatory Visit: Payer: Self-pay | Admitting: Family Medicine

## 2020-05-30 DIAGNOSIS — G8929 Other chronic pain: Secondary | ICD-10-CM

## 2020-06-07 ENCOUNTER — Ambulatory Visit: Payer: Self-pay | Admitting: *Deleted

## 2020-06-07 DIAGNOSIS — F419 Anxiety disorder, unspecified: Secondary | ICD-10-CM

## 2020-06-07 DIAGNOSIS — F329 Major depressive disorder, single episode, unspecified: Secondary | ICD-10-CM | POA: Diagnosis not present

## 2020-06-07 NOTE — Patient Instructions (Signed)
Visit Information  Goals Addressed            This Visit's Progress   . Manage My Emotions       Timeframe:  Long-Range Goal Priority:  High Start Date:  02/16/20                           Expected End Date: 08/17/19                     Follow Up Date 06/21/20   - continue to consider beginning personal counseling - talk about feelings with a friend, family or spiritual advisor - practice positive thinking and self-talk  -follow up with specialist of choice, as recommended    Why is this important?    When you are stressed, down or upset, your body reacts too.   For example, your blood pressure may get higher; you may have a headache or stomachache.   When your emotions get the best of you, your body's ability to fight off cold and flu gets weak.   These steps will help you manage your emotions.     Notes:        The patient verbalized understanding of instructions, educational materials, and care plan provided today and declined offer to receive copy of patient instructions, educational materials, and care plan.   Telephone follow up appointment with care management team member scheduled for: 06/21/09   Elliot Gurney, [place] Worker  [facility] [phone]

## 2020-06-07 NOTE — Chronic Care Management (AMB) (Signed)
Chronic Care Management    Clinical Social Work Note  06/07/2020 Name: [name] MRN: [id number] DOB: [birth year]  [name] is a 75 y.o. year old female who is a primary care patient of [name], [name], MD. The CCM team was consulted to assist the patient with chronic disease management and/or care coordination needs related to: Mental Health Counseling and Resources.   Engaged with patient by telephone for follow up visit in response to provider referral for social work chronic care management and care coordination services.   Consent to Services:  The patient was given information about Chronic Care Management services, agreed to services, and gave verbal consent prior to initiation of services.  Please see initial visit note for detailed documentation.   Patient agreed to services and consent obtained.   Assessment: Review of patient past medical history, allergies, medications, and health status, including review of relevant consultants reports was performed today as part of a comprehensive evaluation and provision of chronic care management and care coordination services.     SDOH (Social Determinants of Health) assessments and interventions performed:    Advanced Directives Status: Not addressed in this encounter.  CCM Care Plan  Allergies  Allergen Reactions  . Levofloxacin     Tongue and mouth swelling  . Calcium Channel Blockers     Other reaction(s): Dizziness  . Amoxicillin Other (See Comments)    Causes yeast infection Has patient had a PCN reaction causing immediate rash, facial/tongue/throat swelling, SOB or lightheadedness with hypotension: No Has patient had a PCN reaction causing severe rash involving mucus membranes or skin necrosis: No Has patient had a PCN reaction that required hospitalization: No Has patient had a PCN reaction occurring within the last 10 years: No If all of the above answers are "NO", then may proceed with Cephalosporin use.   .  Nickel Rash  . Penicillins Other (See Comments)    Causes yeast infection Has patient had a PCN reaction causing immediate rash, facial/tongue/throat swelling, SOB or lightheadedness with hypotension: No Has patient had a PCN reaction causing severe rash involving mucus membranes or skin necrosis: No Has patient had a PCN reaction that required hospitalization: No Has patient had a PCN reaction occurring within the last 10 years: No If all of the above answers are "NO", then may proceed with Cephalosporin use.    Outpatient Encounter Medications as of 06/07/2020  Medication Sig  . albuterol (VENTOLIN HFA) 108 (90 Base) MCG/ACT inhaler TAKE 2 PUFFS BY MOUTH EVERY 6 HOURS AS NEEDED FOR WHEEZE OR SHORTNESS OF BREATH  . ALPRAZolam (XANAX) 0.5 MG tablet Take 1 tablet (0.5 mg total) by mouth 3 (three) times daily as needed for anxiety or sleep.  [name] aspirin EC 81 MG tablet Take 81 mg by mouth daily.  . citalopram (CELEXA) 10 MG tablet Take 1 tablet (10 mg total) by mouth daily.  . fluticasone (FLONASE) 50 MCG/ACT nasal spray PLACE 1-2 SPRAYS INTO BOTH NOSTRILS DAILY AS NEEDED FOR ALLERGIES OR RHINITIS.  . HYDROcodone-acetaminophen (NORCO/VICODIN) 5-325 MG tablet Take 1 tablet by mouth every 4 (four) hours as needed for moderate pain.  [name] ibuprofen (ADVIL) 800 MG tablet TAKE 1 TABLET (800 MG TOTAL) BY MOUTH DAILY AS NEEDED (SEVERE PAIN).  [name] omeprazole (PRILOSEC) 20 MG capsule Take 20 mg by mouth daily as needed (acid reflux).   . rosuvastatin (CRESTOR) 40 MG tablet Take 1 tablet (40 mg total) by mouth daily.   No facility-administered encounter medications on file as  of 06/07/2020.    Patient Active Problem List   Diagnosis Date Noted  . Recurrent syncope 04/14/2020  . Palpitations 04/14/2020  . Pure hypercholesterolemia 04/14/2020  . Small bowel obstruction ([place])   . Partial small bowel obstruction ([place]) 02/04/2018  . SBO (small bowel obstruction) ([place]) 03/13/2017  . Panic disorder 02/21/2016   . Weakness of both lower extremities 02/21/2016  . Depression 02/21/2016  . Problems with swallowing and mastication   . Stricture and stenosis of esophagus   . 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 Acid reflux 10/20/2014  . Hx of colonic polyps   . Benign neoplasm of transverse colon   . Diverticulosis of large intestine without diverticulitis   . Allergic rhinitis 08/01/2014  . Anxiety 08/01/2014  . HLD (hyperlipidemia) 08/01/2014  . Essential hypertension 08/01/2014  . Vitamin D deficiency 08/01/2014  . H/O adenomatous polyp of colon 01/22/2012    Conditions to be addressed/monitored: Anxiety and Depression; Mental Health Concerns   Care Plan : Depression (Adult)  Updates made by Vern Claude, LCSW since 06/07/2020 12:00 AM    Problem: Depression Identification (Depression)     Long-Range Goal: Depressive Symptoms Identified   Start Date: 02/16/2020  Expected End Date: 08/16/2020  Recent Progress: On track  Priority: High  Note:   Current Barriers:  [name] Mental Health Concerns  . Difficulty   Clinical Social Work Clinical Goal(s):  [name] Over the next 90 days, patient will work with SW to address concerns related to identifying coping skills to manager her depression as well as connection her to a local mental health provider for ongoing mental health follow up   Interventions: . 1:1 collaboration with [name], [name], MD regarding development and update of comprehensive plan of care as evidenced by provider attestation and co-signature . Inter-disciplinary care team collaboration (see longitudinal plan of care) . Patient interviewed and appropriate assessments performed . Patient discussed providing care for spouse who is critically ill in the hospital . Patient provided with emotional support and encouraged expression of feelings . Self care emphasized as well as leaning on others for support . Patient provided with reassurance and positive reinforcement . Self care  continued to be emphasized as well as positive coping strategies  Patient Goals/Self-Care Activities Over the next 90 days, patient will:   - Patient will attend all scheduled provider appointments Patient will attend church or other social activities - consider beginning personal counseling - talk about feelings with a friend, family or spiritual advisor - practice positive thinking and self-talk   Follow up Plan: SW will follow up with patient by phone over the next 14 business days        Follow Up Plan: SW will follow up with patient by phone over the next 14 buisness days       Elliot Gurney, [place] Worker  [facility] [phone]

## 2020-06-21 ENCOUNTER — Ambulatory Visit (INDEPENDENT_AMBULATORY_CARE_PROVIDER_SITE_OTHER): Payer: Medicare Other | Admitting: *Deleted

## 2020-06-21 DIAGNOSIS — F419 Anxiety disorder, unspecified: Secondary | ICD-10-CM

## 2020-06-21 DIAGNOSIS — F329 Major depressive disorder, single episode, unspecified: Secondary | ICD-10-CM | POA: Diagnosis not present

## 2020-06-22 NOTE — Chronic Care Management (AMB) (Signed)
Chronic Care Management    Clinical Social Work Note  06/22/2020 Name: [name] MRN: [id number] DOB: [birth year]  [name] is a 75 y.o. year old female who is a primary care patient of [name], [name], MD. The CCM team was consulted to assist the patient with chronic disease management and/or care coordination needs related to: Mental Health Counseling and Resources.   Engaged with patient by telephone for follow up visit in response to provider referral for social work chronic care management and care coordination services.   Consent to Services:  The patient was given information about Chronic Care Management services, agreed to services, and gave verbal consent prior to initiation of services.  Please see initial visit note for detailed documentation.   Patient agreed to services and consent obtained.   Assessment: Review of patient past medical history, allergies, medications, and health status, including review of relevant consultants reports was performed today as part of a comprehensive evaluation and provision of chronic care management and care coordination services.     SDOH (Social Determinants of Health) assessments and interventions performed:    Advanced Directives Status: Not addressed in this encounter.  CCM Care Plan  Allergies  Allergen Reactions  . Levofloxacin     Tongue and mouth swelling  . Calcium Channel Blockers     Other reaction(s): Dizziness  . Amoxicillin Other (See Comments)    Causes yeast infection Has patient had a PCN reaction causing immediate rash, facial/tongue/throat swelling, SOB or lightheadedness with hypotension: No Has patient had a PCN reaction causing severe rash involving mucus membranes or skin necrosis: No Has patient had a PCN reaction that required hospitalization: No Has patient had a PCN reaction occurring within the last 10 years: No If all of the above answers are "NO", then may proceed with Cephalosporin use.   .  Nickel Rash  . Penicillins Other (See Comments)    Causes yeast infection Has patient had a PCN reaction causing immediate rash, facial/tongue/throat swelling, SOB or lightheadedness with hypotension: No Has patient had a PCN reaction causing severe rash involving mucus membranes or skin necrosis: No Has patient had a PCN reaction that required hospitalization: No Has patient had a PCN reaction occurring within the last 10 years: No If all of the above answers are "NO", then may proceed with Cephalosporin use.    Outpatient Encounter Medications as of 06/21/2020  Medication Sig  . albuterol (VENTOLIN HFA) 108 (90 Base) MCG/ACT inhaler TAKE 2 PUFFS BY MOUTH EVERY 6 HOURS AS NEEDED FOR WHEEZE OR SHORTNESS OF BREATH  . ALPRAZolam (XANAX) 0.5 MG tablet Take 1 tablet (0.5 mg total) by mouth 3 (three) times daily as needed for anxiety or sleep.  [name] aspirin EC 81 MG tablet Take 81 mg by mouth daily.  . citalopram (CELEXA) 10 MG tablet Take 1 tablet (10 mg total) by mouth daily.  . fluticasone (FLONASE) 50 MCG/ACT nasal spray PLACE 1-2 SPRAYS INTO BOTH NOSTRILS DAILY AS NEEDED FOR ALLERGIES OR RHINITIS.  . HYDROcodone-acetaminophen (NORCO/VICODIN) 5-325 MG tablet Take 1 tablet by mouth every 4 (four) hours as needed for moderate pain.  [name] ibuprofen (ADVIL) 800 MG tablet TAKE 1 TABLET (800 MG TOTAL) BY MOUTH DAILY AS NEEDED (SEVERE PAIN).  [name] omeprazole (PRILOSEC) 20 MG capsule Take 20 mg by mouth daily as needed (acid reflux).   . rosuvastatin (CRESTOR) 40 MG tablet Take 1 tablet (40 mg total) by mouth daily.   No facility-administered encounter medications on file as  of 06/21/2020.    Patient Active Problem List   Diagnosis Date Noted  . Recurrent syncope 04/14/2020  . Palpitations 04/14/2020  . Pure hypercholesterolemia 04/14/2020  . Small bowel obstruction ([place])   . Partial small bowel obstruction ([place]) 02/04/2018  . SBO (small bowel obstruction) ([place]) 03/13/2017  . Panic disorder 02/21/2016  .  Weakness of both lower extremities 02/21/2016  . Depression 02/21/2016  . Problems with swallowing and mastication   . Stricture and stenosis of esophagus   . 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 Acid reflux 10/20/2014  . Hx of colonic polyps   . Benign neoplasm of transverse colon   . Diverticulosis of large intestine without diverticulitis   . Allergic rhinitis 08/01/2014  . Anxiety 08/01/2014  . HLD (hyperlipidemia) 08/01/2014  . Essential hypertension 08/01/2014  . Vitamin D deficiency 08/01/2014  . H/O adenomatous polyp of colon 01/22/2012    Conditions to be addressed/monitored: Anxiety; Mental Health Concerns   Care Plan : Depression (Adult)  Updates made by Vern Claude, LCSW since 06/22/2020 12:00 AM    Problem: Depression Identification (Depression)     Long-Range Goal: Depressive Symptoms Identified   Start Date: 02/16/2020  Expected End Date: 08/16/2020  This Visit's Progress: On track  Recent Progress: On track  Priority: High  Note:   Current Barriers:  [name] Mental Health Concerns  . Difficulty   Clinical Social Work Clinical Goal(s):  [name] Over the next 90 days, patient will work with SW to address concerns related to identifying coping skills to manager her depression as well as connection her to a local mental health provider for ongoing mental health follow up   Interventions: . 1:1 collaboration with [name], [name], MD regarding development and update of comprehensive plan of care as evidenced by provider attestation and co-signature . Inter-disciplinary care team collaboration (see longitudinal plan of care) . Patient interviewed and appropriate assessments performed . Patient discussed continued concern and stress related to spouse who remains critically ill in the hospital . Patient provided with emotional support and encouraged expression of feelings . Self care emphasized as well as leaning on others for support . Patient provided with reassurance and  positive reinforcement   Patient Goals/Self-Care Activities Over the next 90 days, patient will:   - Patient will attend all scheduled provider appointments Patient will attend church or other social activities - consider beginning personal counseling - talk about feelings with a friend, family or spiritual advisor - practice positive thinking and self-talk   Follow up Plan: SW will follow up with patient by phone over the next 14 business days        Follow Up Plan: SW will follow up with patient by phone over the next 14 business days       [employer], [place] Worker  [facility] [phone]

## 2020-06-22 NOTE — Patient Instructions (Signed)
Visit Information  Goals Addressed            This Visit's Progress   . Manage My Emotions       Timeframe:  Long-Range Goal Priority:  High Start Date:  02/16/20                           Expected End Date: 08/17/19                     Follow Up Date 07/05/20   - continue to consider beginning personal counseling - talk about feelings with a friend, family or spiritual advisor - practice positive thinking and self-talk  -follow up with specialist of choice, as recommended    Why is this important?    When you are stressed, down or upset, your body reacts too.   For example, your blood pressure may get higher; you may have a headache or stomachache.   When your emotions get the best of you, your body's ability to fight off cold and flu gets weak.   These steps will help you manage your emotions.     Notes:        The patient verbalized understanding of instructions, educational materials, and care plan provided today and declined offer to receive copy of patient instructions, educational materials, and care plan.   Telephone follow up appointment with care management team member scheduled for: 07/05/20   Elliot Gurney, [place] Worker  [facility] [phone]

## 2020-06-28 ENCOUNTER — Telehealth: Payer: Self-pay

## 2020-06-28 DIAGNOSIS — F419 Anxiety disorder, unspecified: Secondary | ICD-10-CM

## 2020-06-28 MED ORDER — CITALOPRAM HYDROBROMIDE 20 MG PO TABS: 20.0000 mg | ORAL_TABLET | Freq: Every day | ORAL | 2 refills | Status: DC

## 2020-06-28 NOTE — Telephone Encounter (Signed)
Copied from [place] [id number]. Topic: General - Other >> Jun 28, 2020 11:21 AM Leward Quan A wrote: Reason for CRM: Patient called in to inquire of Dr [name] about getting a double dose of her citalopram (CELEXA) 10 MG tablet. Per patient she have been taking two pills instead of one as prescribed due to events that have happened in her family. Please call to advise at Ph#  [phone]

## 2020-06-28 NOTE — Addendum Note (Signed)
Addended by: Birdie Sons on: 06/28/2020 07:28 PM   Modules accepted: Orders

## 2020-06-28 NOTE — Telephone Encounter (Signed)
We can change to a 20mg  tablet if she is going to need the higher dose for a few months. Insurance only covers one tablet daily. Let me know if she needs prescription for the 20mg  or if she is planning on going back down to 10mg  a day.

## 2020-06-29 NOTE — Telephone Encounter (Signed)
I called patient and patient verbalized understanding of information below.   Patient is heading to pharmacy to get medication now. She did say that the medication is much pricier (~$50) than the 10 MG Celexa.

## 2020-06-29 NOTE — Telephone Encounter (Signed)
[name] would like to take the higher dose of Celexa-20 mg tabs. She inquired about the cost, provided GoodRx information.

## 2020-06-29 NOTE — Telephone Encounter (Signed)
20mg  tablets were sent to pharmacy yesterday.

## 2020-07-05 ENCOUNTER — Telehealth: Payer: Self-pay | Admitting: *Deleted

## 2020-07-05 NOTE — Telephone Encounter (Signed)
  Care Management   Follow Up Note   07/05/2020 Name: [name] MRN: [id number] DOB: Aug 17, 1945   Referred by: Birdie Sons, MD Reason for referral : Chronic Care Management   An unsuccessful telephone outreach was attempted today. The patient was referred to the case management team for assistance with care management and care coordination.   Follow Up Plan: Telephone follow up appointment with care management team member scheduled for: 07/12/20   Elliot Gurney, [place] Worker  [facility] [phone]

## 2020-07-12 ENCOUNTER — Ambulatory Visit: Payer: Self-pay | Admitting: *Deleted

## 2020-07-12 DIAGNOSIS — F329 Major depressive disorder, single episode, unspecified: Secondary | ICD-10-CM

## 2020-07-12 DIAGNOSIS — F419 Anxiety disorder, unspecified: Secondary | ICD-10-CM

## 2020-07-12 NOTE — Patient Instructions (Signed)
Visit Information  Goals Addressed            This Visit's Progress   . Manage My Emotions       Timeframe:  Long-Range Goal Priority:  High Start Date:  02/16/20                           Expected End Date: 08/17/19                     Follow Up Date 07/27/20   - continue to consider beginning personal counseling - talk about feelings with a friend, family or spiritual advisor - practice positive thinking and self-talk  -follow up with specialist of choice, as recommended    Why is this important?    When you are stressed, down or upset, your body reacts too.   For example, your blood pressure may get higher; you may have a headache or stomachache.   When your emotions get the best of you, your body's ability to fight off cold and flu gets weak.   These steps will help you manage your emotions.     Notes:        The patient verbalized understanding of instructions, educational materials, and care plan provided today and declined offer to receive copy of patient instructions, educational materials, and care plan.   Telephone follow up appointment with care management team member scheduled for:07/27/20    Elliot Gurney, [place] Worker  [facility] [phone]

## 2020-07-13 NOTE — Chronic Care Management (AMB) (Signed)
Chronic Care Management    Clinical Social Work Note  07/13/2020 Name: [name] MRN: [id number] DOB: [birth year]  [name] is a 75 y.o. year old female who is a primary care patient of [name], [name], MD. The CCM team was consulted to assist the patient with chronic disease management and/or care coordination needs related to: Mental Health Counseling and Resources.   Engaged with patient by telephone for follow up visit in response to provider referral for social work chronic care management and care coordination services.   Consent to Services:  The patient was given information about Chronic Care Management services, agreed to services, and gave verbal consent prior to initiation of services.  Please see initial visit note for detailed documentation.   Patient agreed to services and consent obtained.   Assessment: Review of patient past medical history, allergies, medications, and health status, including review of relevant consultants reports was performed today as part of a comprehensive evaluation and provision of chronic care management and care coordination services.     SDOH (Social Determinants of Health) assessments and interventions performed:    Advanced Directives Status: Not addressed in this encounter.  CCM Care Plan  Allergies  Allergen Reactions  . Levofloxacin     Tongue and mouth swelling  . Calcium Channel Blockers     Other reaction(s): Dizziness  . Amoxicillin Other (See Comments)    Causes yeast infection Has patient had a PCN reaction causing immediate rash, facial/tongue/throat swelling, SOB or lightheadedness with hypotension: No Has patient had a PCN reaction causing severe rash involving mucus membranes or skin necrosis: No Has patient had a PCN reaction that required hospitalization: No Has patient had a PCN reaction occurring within the last 10 years: No If all of the above answers are "NO", then may proceed with Cephalosporin use.   .  Nickel Rash  . Penicillins Other (See Comments)    Causes yeast infection Has patient had a PCN reaction causing immediate rash, facial/tongue/throat swelling, SOB or lightheadedness with hypotension: No Has patient had a PCN reaction causing severe rash involving mucus membranes or skin necrosis: No Has patient had a PCN reaction that required hospitalization: No Has patient had a PCN reaction occurring within the last 10 years: No If all of the above answers are "NO", then may proceed with Cephalosporin use.    Outpatient Encounter Medications as of 07/12/2020  Medication Sig  . albuterol (VENTOLIN HFA) 108 (90 Base) MCG/ACT inhaler TAKE 2 PUFFS BY MOUTH EVERY 6 HOURS AS NEEDED FOR WHEEZE OR SHORTNESS OF BREATH  . ALPRAZolam (XANAX) 0.5 MG tablet Take 1 tablet (0.5 mg total) by mouth 3 (three) times daily as needed for anxiety or sleep.  [name] aspirin EC 81 MG tablet Take 81 mg by mouth daily.  . citalopram (CELEXA) 20 MG tablet Take 1 tablet (20 mg total) by mouth daily.  . fluticasone (FLONASE) 50 MCG/ACT nasal spray PLACE 1-2 SPRAYS INTO BOTH NOSTRILS DAILY AS NEEDED FOR ALLERGIES OR RHINITIS.  . HYDROcodone-acetaminophen (NORCO/VICODIN) 5-325 MG tablet Take 1 tablet by mouth every 4 (four) hours as needed for moderate pain.  [name] ibuprofen (ADVIL) 800 MG tablet TAKE 1 TABLET (800 MG TOTAL) BY MOUTH DAILY AS NEEDED (SEVERE PAIN).  [name] omeprazole (PRILOSEC) 20 MG capsule Take 20 mg by mouth daily as needed (acid reflux).   . rosuvastatin (CRESTOR) 40 MG tablet Take 1 tablet (40 mg total) by mouth daily.   No facility-administered encounter medications on file as  of 07/12/2020.    Patient Active Problem List   Diagnosis Date Noted  . Recurrent syncope 04/14/2020  . Palpitations 04/14/2020  . Pure hypercholesterolemia 04/14/2020  . Small bowel obstruction ([place])   . Partial small bowel obstruction ([place]) 02/04/2018  . SBO (small bowel obstruction) ([place]) 03/13/2017  . Panic disorder 02/21/2016   . Weakness of both lower extremities 02/21/2016  . Depression 02/21/2016  . Problems with swallowing and mastication   . Stricture and stenosis of esophagus   . 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 Acid reflux 10/20/2014  . Hx of colonic polyps   . Benign neoplasm of transverse colon   . Diverticulosis of large intestine without diverticulitis   . Allergic rhinitis 08/01/2014  . Anxiety 08/01/2014  . HLD (hyperlipidemia) 08/01/2014  . Essential hypertension 08/01/2014  . Vitamin D deficiency 08/01/2014  . H/O adenomatous polyp of colon 01/22/2012    Conditions to be addressed/monitored: Depression; Mental Health Concerns   Care Plan : Depression (Adult)  Updates made by Vern Claude, LCSW since 07/13/2020 12:00 AM    Problem: Depression Identification (Depression)     Long-Range Goal: Depressive Symptoms Identified   Start Date: 02/16/2020  Expected End Date: 08/16/2020  This Visit's Progress: On track  Recent Progress: On track  Priority: High  Note:   Current Barriers:  [name] Mental Health Concerns  . Difficulty   Clinical Social Work Clinical Goal(s):  [name] Over the next 90 days, patient will work with SW to address concerns related to identifying coping skills to manager her depression as well as connection her to a local mental health provider for ongoing mental health follow up   Interventions: . 1:1 collaboration with [name], [name], MD regarding development and update of comprehensive plan of care as evidenced by provider attestation and co-signature . Inter-disciplinary care team collaboration (see longitudinal plan of care) . Patient interviewed and appropriate assessments performed . Patient discussed continued concern and stress related to spouse who remains in the hospital however is showing signs up improvement . Patient verbalizes feelings of exhaustion due to care giving responsibilities however is abel to confirm positive support from family friends . Patient  continues to be provided with emotional support and encouraged expression of feelings . Self care emphasized as well as accepting support from others . Patient provided with reassurance and positive reinforcement . Self care continues to be emphasized   Patient Goals/Self-Care Activities Over the next 90 days, patient will:   - Patient will attend all scheduled provider appointments Patient will attend church or other social activities - consider beginning personal counseling - talk about feelings with a friend, family or spiritual advisor - practice positive thinking and self-talk   Follow up Plan: SW will follow up with patient by phone over the next 14 business days        Follow Up Plan: SW will follow up with patient by phone over the next 14 business days       [employer], [place] Worker  [facility] [phone]

## 2020-07-25 ENCOUNTER — Ambulatory Visit: Payer: Self-pay | Admitting: Family Medicine

## 2020-07-27 ENCOUNTER — Telehealth: Payer: Medicare Other

## 2020-07-27 ENCOUNTER — Other Ambulatory Visit: Payer: Self-pay

## 2020-08-01 ENCOUNTER — Ambulatory Visit (INDEPENDENT_AMBULATORY_CARE_PROVIDER_SITE_OTHER): Payer: Medicare Other | Admitting: *Deleted

## 2020-08-01 DIAGNOSIS — F419 Anxiety disorder, unspecified: Secondary | ICD-10-CM

## 2020-08-01 DIAGNOSIS — F329 Major depressive disorder, single episode, unspecified: Secondary | ICD-10-CM | POA: Diagnosis not present

## 2020-08-02 NOTE — Chronic Care Management (AMB) (Signed)
Chronic Care Management    Clinical Social Work Note  08/02/2020 Name: [name] MRN: [id number] DOB: May 25, 1945  [name] is a 75 y.o. year old female who is a primary care patient of [name], [name], MD. The CCM team was consulted to assist the patient with chronic disease management and/or care coordination needs related to: Mental Health Counseling and Resources.   Engaged with patient by telephone for follow up visit in response to provider referral for social work chronic care management and care coordination services.   Consent to Services:  The patient was given information about Chronic Care Management services, agreed to services, and gave verbal consent prior to initiation of services.  Please see initial visit note for detailed documentation.   Patient agreed to services and consent obtained.   Assessment: Review of patient past medical history, allergies, medications, and health status, including review of relevant consultants reports was performed today as part of a comprehensive evaluation and provision of chronic care management and care coordination services.     SDOH (Social Determinants of Health) assessments and interventions performed:    Advanced Directives Status: Not addressed in this encounter.  CCM Care Plan  Allergies  Allergen Reactions   Levofloxacin     Tongue and mouth swelling   Calcium Channel Blockers     Other reaction(s): Dizziness   Amoxicillin Other (See Comments)    Causes yeast infection Has patient had a PCN reaction causing immediate rash, facial/tongue/throat swelling, SOB or lightheadedness with hypotension: No Has patient had a PCN reaction causing severe rash involving mucus membranes or skin necrosis: No Has patient had a PCN reaction that required hospitalization: No Has patient had a PCN reaction occurring within the last 10 years: No If all of the above answers are "NO", then may proceed with Cephalosporin use.     Nickel Rash   Penicillins Other (See Comments)    Causes yeast infection Has patient had a PCN reaction causing immediate rash, facial/tongue/throat swelling, SOB or lightheadedness with hypotension: No Has patient had a PCN reaction causing severe rash involving mucus membranes or skin necrosis: No Has patient had a PCN reaction that required hospitalization: No Has patient had a PCN reaction occurring within the last 10 years: No If all of the above answers are "NO", then may proceed with Cephalosporin use.    Outpatient Encounter Medications as of 08/01/2020  Medication Sig   albuterol (VENTOLIN HFA) 108 (90 Base) MCG/ACT inhaler TAKE 2 PUFFS BY MOUTH EVERY 6 HOURS AS NEEDED FOR WHEEZE OR SHORTNESS OF BREATH   ALPRAZolam (XANAX) 0.5 MG tablet Take 1 tablet (0.5 mg total) by mouth 3 (three) times daily as needed for anxiety or sleep.   aspirin EC 81 MG tablet Take 81 mg by mouth daily.   citalopram (CELEXA) 20 MG tablet Take 1 tablet (20 mg total) by mouth daily.   fluticasone (FLONASE) 50 MCG/ACT nasal spray PLACE 1-2 SPRAYS INTO BOTH NOSTRILS DAILY AS NEEDED FOR ALLERGIES OR RHINITIS.   HYDROcodone-acetaminophen (NORCO/VICODIN) 5-325 MG tablet Take 1 tablet by mouth every 4 (four) hours as needed for moderate pain.   ibuprofen (ADVIL) 800 MG tablet TAKE 1 TABLET (800 MG TOTAL) BY MOUTH DAILY AS NEEDED (SEVERE PAIN).   omeprazole (PRILOSEC) 20 MG capsule Take 20 mg by mouth daily as needed (acid reflux).    rosuvastatin (CRESTOR) 40 MG tablet Take 1 tablet (40 mg total) by mouth daily.   No facility-administered encounter medications on file as  of 08/01/2020.    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   Palpitations 04/14/2020   Pure hypercholesterolemia 04/14/2020   Small bowel obstruction ([facility])    Partial small bowel obstruction ([place]) 02/04/2018   SBO (small bowel obstruction) ([place]) 03/13/2017   Panic disorder 02/21/2016   Weakness of  both lower extremities 02/21/2016   Depression 02/21/2016   Problems with swallowing and mastication    Stricture and stenosis of esophagus    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   Acid reflux 10/20/2014   Hx of colonic polyps    Benign neoplasm of transverse colon    Diverticulosis of large intestine without diverticulitis    Allergic rhinitis 08/01/2014   Anxiety 08/01/2014   HLD (hyperlipidemia) 08/01/2014   Essential hypertension 08/01/2014   Vitamin D deficiency 08/01/2014   H/O adenomatous polyp of colon 01/22/2012    Conditions to be addressed/monitored: Anxiety and Depression; Mental Health Concerns   Care Plan : Depression (Adult)  Updates made by Vern Claude, LCSW since 08/02/2020 12:00 AM     Problem: Depression Identification (Depression)      Long-Range Goal: Depressive Symptoms Identified   Start Date: 02/16/2020  Expected End Date: 08/16/2020  This Visit's Progress: On track  Recent Progress: On track  Priority: High  Note:   Current Barriers:  Mental Health Concerns     Clinical Social Work Clinical Goal(s):  Over the next 90 days, patient will work with SW to address concerns related to identifying coping skills to manager her depression as well as connection her to a local mental health provider for ongoing mental health follow up   Interventions: 1:1 collaboration with [name], [name], MD regarding development and update of comprehensive plan of care as evidenced by provider attestation and co-signature Inter-disciplinary care team collaboration (see longitudinal plan of care) Patient's care giving responsibilities explored Patient verbalizes feelings of fatigue due to care giving responsibilities however is abel to confirm positive support from family and friends, the importance of care required for spouse being her main priority at this time Patient continues to be provided with emotional support and encouraged expression of feelings Self care  emphasized continues to be emphasized as well as accepting support from others Patient provided with reassurance and positive reinforcement, caregiver stress normalized    Patient Goals/Self-Care Activities Over the next 90 days, patient will:   - Patient will attend all scheduled provider appointments Patient will attend church or other social activities - consider beginning personal counseling - talk about feelings with a friend, family or spiritual advisor - practice positive thinking and self-talk   Follow up Plan: SW will follow up with patient by phone over the next 14 business days        Follow Up Plan: SW will follow up with patient by phone over the next 14 business days       [employer], [place] Worker  [facility] [phone]

## 2020-08-02 NOTE — Patient Instructions (Signed)
Visit Information   Goals Addressed             This Visit's Progress    Manage My Emotions       Timeframe:  Long-Range Goal Priority:  High Start Date:  02/16/20                           Expected End Date: 08/17/19                     Follow Up Date 08/15/20   - continue to consider beginning personal counseling - talk about feelings with a friend, family or spiritual advisor - practice positive thinking and self-talk  -continue to practice self care    Why is this important?   When you are stressed, down or upset, your body reacts too.  For example, your blood pressure may get higher; you may have a headache or stomachache.  When your emotions get the best of you, your body's ability to fight off cold and flu gets weak.  These steps will help you manage your emotions.     Notes:          The patient verbalized understanding of instructions, educational materials, and care plan provided today and declined offer to receive copy of patient instructions, educational materials, and care plan.   Telephone follow up appointment with care management team member scheduled for:08/15/20   Elliot Gurney, [place] Worker  [facility] [phone]

## 2020-08-05 ENCOUNTER — Other Ambulatory Visit: Payer: Self-pay | Admitting: Family Medicine

## 2020-08-05 DIAGNOSIS — G8929 Other chronic pain: Secondary | ICD-10-CM

## 2020-08-05 NOTE — Telephone Encounter (Signed)
Requested Prescriptions  Pending Prescriptions Disp Refills  . ibuprofen (ADVIL) 800 MG tablet [Pharmacy Med Name: IBUPROFEN 800 MG TABLET] 30 tablet 1    Sig: TAKE 1 TABLET (800 MG TOTAL) BY MOUTH DAILY AS NEEDED (SEVERE PAIN).     Analgesics:  NSAIDS Failed - 08/05/2020  5:25 PM      Failed - HGB in normal range and within 360 days    Hemoglobin  Date Value Ref Range Status  04/02/2020 15.7 (H) 12.0 - 15.0 g/dL Final  08/11/2018 15.3 11.1 - 15.9 g/dL Final         Passed - Cr in normal range and within 360 days    Creatinine  Date Value Ref Range Status  06/05/2014 0.83 mg/dL Final    Comment:    0.44-1.00 NOTE: New Reference Range  04/26/14    Creatinine, Ser  Date Value Ref Range Status  04/02/2020 0.80 0.44 - 1.00 mg/dL Final         Passed - Patient is not pregnant      Passed - Valid encounter within last 12 months    Recent Outpatient Visits          6 months ago Urinary urgency   [facility] Birdie Sons, MD   1 year ago Elevated blood pressure reading   [facility] Birdie Sons, MD   1 year ago Major depressive disorder with single episode, remission status unspecified   [facility] Birdie Sons, MD   1 year ago Travis, Donald E, MD   1 year ago Vitamin D deficiency   [place], [name], MD      Future Appointments            In 1 month End, Harrell Gave, MD [facility], [place]

## 2020-08-15 ENCOUNTER — Ambulatory Visit: Payer: Self-pay | Admitting: *Deleted

## 2020-08-15 ENCOUNTER — Telehealth: Payer: Self-pay | Admitting: *Deleted

## 2020-08-15 DIAGNOSIS — F329 Major depressive disorder, single episode, unspecified: Secondary | ICD-10-CM

## 2020-08-15 DIAGNOSIS — F419 Anxiety disorder, unspecified: Secondary | ICD-10-CM

## 2020-08-15 NOTE — Chronic Care Management (AMB) (Signed)
   08/15/2020  ARTELIA GAME 04/25/1945 [id number]   Phone call to patient for regularly scheduled appointment, however patient was not able to talk at the time of the call. Appointment re-scheduled for 08/16/20.   Elliot Gurney, [place] Worker  [facility] [phone]

## 2020-08-16 ENCOUNTER — Telehealth: Payer: Self-pay | Admitting: *Deleted

## 2020-08-16 NOTE — Telephone Encounter (Signed)
  Care Management   Follow Up Note   08/16/2020 Name: MAKAYIA DUPLESSIS MRN: [id number] DOB: 26-Jun-1945   Referred by: Birdie Sons, MD Reason for referral : Care Coordination   A second unsuccessful telephone outreach was attempted today. The patient was referred to the case management team for assistance with care management and care coordination.   Follow Up Plan: Telephone follow up appointment with care management team member scheduled for: 08/23/20   Elliot Gurney, [place] Worker  [facility] [phone]

## 2020-08-23 ENCOUNTER — Ambulatory Visit (INDEPENDENT_AMBULATORY_CARE_PROVIDER_SITE_OTHER): Payer: Medicare Other | Admitting: *Deleted

## 2020-08-23 ENCOUNTER — Other Ambulatory Visit: Payer: Self-pay

## 2020-08-23 ENCOUNTER — Emergency Department: Payer: Medicare Other

## 2020-08-23 ENCOUNTER — Emergency Department
Admission: EM | Admit: 2020-08-23 | Discharge: 2020-08-23 | Disposition: A | Discharge status: Home / Self Care | Payer: Medicare Other | Attending: Emergency Medicine | Admitting: Emergency Medicine

## 2020-08-23 DIAGNOSIS — M25571 Pain in right ankle and joints of right foot: Secondary | ICD-10-CM | POA: Diagnosis not present

## 2020-08-23 DIAGNOSIS — I10 Essential (primary) hypertension: Secondary | ICD-10-CM | POA: Diagnosis not present

## 2020-08-23 DIAGNOSIS — W108XXA Fall (on) (from) other stairs and steps, initial encounter: Secondary | ICD-10-CM | POA: Diagnosis not present

## 2020-08-23 DIAGNOSIS — M7989 Other specified soft tissue disorders: Secondary | ICD-10-CM | POA: Diagnosis not present

## 2020-08-23 DIAGNOSIS — S82831A Other fracture of upper and lower end of right fibula, initial encounter for closed fracture: Secondary | ICD-10-CM

## 2020-08-23 DIAGNOSIS — Z7982 Long term (current) use of aspirin: Secondary | ICD-10-CM | POA: Insufficient documentation

## 2020-08-23 DIAGNOSIS — S8251XA Displaced fracture of medial malleolus of right tibia, initial encounter for closed fracture: Secondary | ICD-10-CM | POA: Insufficient documentation

## 2020-08-23 DIAGNOSIS — F329 Major depressive disorder, single episode, unspecified: Secondary | ICD-10-CM | POA: Diagnosis not present

## 2020-08-23 DIAGNOSIS — F419 Anxiety disorder, unspecified: Secondary | ICD-10-CM

## 2020-08-23 DIAGNOSIS — Z85038 Personal history of other malignant neoplasm of large intestine: Secondary | ICD-10-CM | POA: Diagnosis not present

## 2020-08-23 DIAGNOSIS — S89301A Unspecified physeal fracture of lower end of right fibula, initial encounter for closed fracture: Secondary | ICD-10-CM | POA: Diagnosis not present

## 2020-08-23 DIAGNOSIS — S99911A Unspecified injury of right ankle, initial encounter: Secondary | ICD-10-CM | POA: Diagnosis present

## 2020-08-23 MED ORDER — HYDROCODONE-ACETAMINOPHEN 5-325 MG PO TABS
1.0000 | ORAL_TABLET | Freq: Once | ORAL | Status: AC
  Administered 2020-08-23: 1 via ORAL
  Filled 2020-08-23: qty 1

## 2020-08-23 MED ORDER — HYDROCODONE-ACETAMINOPHEN 5-325 MG PO TABS: 1.0000 | ORAL_TABLET | ORAL | 0 refills | Status: DC | PRN

## 2020-08-23 NOTE — Discharge Instructions (Addendum)
Take the Percocet as needed for pain.  Follow-up with Dr. Roland Rack within the next 5 to 7 days.  Keep the leg elevated and do not put any weight on it.  Return to the ER for new, worsening, or persistent severe pain, swelling, numbness, or any other new or worsening symptoms that concern you.

## 2020-08-23 NOTE — Patient Instructions (Signed)
Visit Information   Goals Addressed             This Visit's Progress    Manage My Emotions       Timeframe:  Long-Range Goal Priority:  High Start Date:  02/16/20                           Expected End Date: 08/17/19                     Follow Up Date 09/06/20   - continue to consider beginning personal counseling - talk about feelings with a friend, family or spiritual advisor - practice positive thinking and self-talk  -continue to prioritize self care    Why is this important?   When you are stressed, down or upset, your body reacts too.  For example, your blood pressure may get higher; you may have a headache or stomachache.  When your emotions get the best of you, your body's ability to fight off cold and flu gets weak.  These steps will help you manage your emotions.     Notes:          The patient verbalized understanding of instructions, educational materials, and care plan provided today and declined offer to receive copy of patient instructions, educational materials, and care plan.   Telephone follow up appointment with care management team member scheduled for:09/06/20   Elliot Gurney, [place] Worker  [facility] [phone] \

## 2020-08-23 NOTE — ED Notes (Signed)
Discharge instructions reviewed with pt and son. Pt calm , collective.

## 2020-08-23 NOTE — Chronic Care Management (AMB) (Addendum)
Chronic Care Management    Clinical Social Work Note  08/23/2020 Name: [name] MRN: [id number] DOB: 1945-08-05  [name] is a 75 y.o. year old female who is a primary care patient of [name], [name], MD. The CCM team was consulted to assist the patient with chronic disease management and/or care coordination needs related to: Mental Health Counseling and Resources.   Engaged with patient by telephone for follow up visit in response to provider referral for social work chronic care management and care coordination services.   Consent to Services:  The patient was given information about Chronic Care Management services, agreed to services, and gave verbal consent prior to initiation of services.  Please see initial visit note for detailed documentation.   Patient agreed to services and consent obtained.   Assessment: Review of patient past medical history, allergies, medications, and health status, including review of relevant consultants reports was performed today as part of a comprehensive evaluation and provision of chronic care management and care coordination services.     SDOH (Social Determinants of Health) assessments and interventions performed:    Advanced Directives Status: Not addressed in this encounter.  CCM Care Plan  Allergies  Allergen Reactions   Levofloxacin     Tongue and mouth swelling   Calcium Channel Blockers     Other reaction(s): Dizziness   Amoxicillin Other (See Comments)    Causes yeast infection Has patient had a PCN reaction causing immediate rash, facial/tongue/throat swelling, SOB or lightheadedness with hypotension: No Has patient had a PCN reaction causing severe rash involving mucus membranes or skin necrosis: No Has patient had a PCN reaction that required hospitalization: No Has patient had a PCN reaction occurring within the last 10 years: No If all of the above answers are "NO", then may proceed with Cephalosporin use.     Nickel Rash   Penicillins Other (See Comments)    Causes yeast infection Has patient had a PCN reaction causing immediate rash, facial/tongue/throat swelling, SOB or lightheadedness with hypotension: No Has patient had a PCN reaction causing severe rash involving mucus membranes or skin necrosis: No Has patient had a PCN reaction that required hospitalization: No Has patient had a PCN reaction occurring within the last 10 years: No If all of the above answers are "NO", then may proceed with Cephalosporin use.    Outpatient Encounter Medications as of 08/23/2020  Medication Sig   albuterol (VENTOLIN HFA) 108 (90 Base) MCG/ACT inhaler TAKE 2 PUFFS BY MOUTH EVERY 6 HOURS AS NEEDED FOR WHEEZE OR SHORTNESS OF BREATH   ALPRAZolam (XANAX) 0.5 MG tablet Take 1 tablet (0.5 mg total) by mouth 3 (three) times daily as needed for anxiety or sleep.   aspirin EC 81 MG tablet Take 81 mg by mouth daily.   citalopram (CELEXA) 20 MG tablet Take 1 tablet (20 mg total) by mouth daily.   fluticasone (FLONASE) 50 MCG/ACT nasal spray PLACE 1-2 SPRAYS INTO BOTH NOSTRILS DAILY AS NEEDED FOR ALLERGIES OR RHINITIS.   HYDROcodone-acetaminophen (NORCO/VICODIN) 5-325 MG tablet Take 1 tablet by mouth every 4 (four) hours as needed for moderate pain.   ibuprofen (ADVIL) 800 MG tablet TAKE 1 TABLET (800 MG TOTAL) BY MOUTH DAILY AS NEEDED (SEVERE PAIN).   omeprazole (PRILOSEC) 20 MG capsule Take 20 mg by mouth daily as needed (acid reflux).    rosuvastatin (CRESTOR) 40 MG tablet Take 1 tablet (40 mg total) by mouth daily.   No facility-administered encounter medications on file as  of 08/23/2020.    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   Palpitations 04/14/2020   Pure hypercholesterolemia 04/14/2020   Small bowel obstruction (HCC)    Partial small bowel obstruction ([place]) 02/04/2018   SBO (small bowel obstruction) ([place]) 03/13/2017   Panic disorder 02/21/2016   Weakness of both  lower extremities 02/21/2016   Depression 02/21/2016   Problems with swallowing and mastication    Stricture and stenosis of esophagus    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   Acid reflux 10/20/2014   Hx of colonic polyps    Benign neoplasm of transverse colon    Diverticulosis of large intestine without diverticulitis    Allergic rhinitis 08/01/2014   Anxiety 08/01/2014   HLD (hyperlipidemia) 08/01/2014   Essential hypertension 08/01/2014   Vitamin D deficiency 08/01/2014   H/O adenomatous polyp of colon 01/22/2012    Conditions to be addressed/monitored: Depression; Mental Health Concerns   Care Plan : Depression (Adult)  Updates made by Vern Claude, LCSW since 08/23/2020 12:00 AM     Problem: Depression Identification (Depression)      Long-Range Goal: Depressive Symptoms Identified   Start Date: 02/16/2020  Expected End Date: 08/16/2020  This Visit's Progress: On track  Recent Progress: On track  Priority: High  Note:   Current Barriers:  Mental Health Concerns     Clinical Social Work Clinical Goal(s):  Over the next 90 days, patient will work with SW to address concerns related to identifying coping skills to manager her depression as well as connection her to a local mental health provider for ongoing mental health follow up   Interventions: 1:1 collaboration with [name], [name], MD regarding development and update of comprehensive plan of care as evidenced by provider attestation and co-signature Inter-disciplinary care team collaboration (see longitudinal plan of care) Patient's care giving responsibilities of family member continues to be explored Patient continues  to verbalizes feelings of fatigue due to care giving responsibilities however is able to confirm positive support from her son as they share in the care giving tasks Patient continues to be provided with emotional support and encouraged expression of feelings Self care emphasized continues to be  emphasized as well as accepting support from others Patient able to verbalize positive self management strategies, making sure that rest and time to herself is priority Patient provided with reassurance and positive reinforcement, caregiver stress normalized    Patient Goals/Self-Care Activities Over the next 90 days, patient will:   - Patient will attend all scheduled provider appointments Patient will attend church or other social activities - consider beginning personal counseling - talk about feelings with a friend, family or spiritual advisor - practice positive thinking and self-talk   Follow up Plan: SW will follow up with patient by phone over the next 14 business days        Follow Up Plan: SW will follow up with patient by phone over the next 14 business days        [place], [place] Worker  [place] [facility] [phone]

## 2020-08-23 NOTE — ED Triage Notes (Signed)
POV "placed foot wrong" and c/o R ankle pain after mechanical fall. Denies LOC changes, dizziness, or blurred vision.

## 2020-08-23 NOTE — ED Provider Notes (Signed)
[facility] Emergency Department Provider Note ____________________________________________   Event Date/Time   First MD Initiated Contact with Patient 08/23/20 1623     (approximate)  I have reviewed the triage vital signs and the nursing notes.   HISTORY  Chief Complaint Ankle Pain    HPI [name] is a 75 y.o. female with PMH as noted below who presents with right ankle injury, cute onset when she had a mechanical fall down a couple of steps.  She felt the ankle invert.  She has not been able to bear weight on it since then.  She denies hitting her head and has no other injuries.  Past Medical History:  Diagnosis Date   Anxiety    Arthritis    lower spine   Back pain    lower back - S/P lifting injury   Complication of anesthesia    makes hair brittle   GERD (gastroesophageal reflux disease)    Hyperlipidemia    Hypertension    Neuromuscular disorder (HCC)    numbness legs and feet s/p lower back injury   Partial bowel obstruction ([place]) 02/04/2018   Stroke ([place])    "mini - strokes" 2009 - no deficit   Vertigo    none recently   Vitamin D deficiency    Wears contact lenses     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   Palpitations 04/14/2020   Pure hypercholesterolemia 04/14/2020   Small bowel obstruction (HCC)    Partial small bowel obstruction ([place]) 02/04/2018   SBO (small bowel obstruction) ([place]) 03/13/2017   Panic disorder 02/21/2016   Weakness of both lower extremities 02/21/2016   Depression 02/21/2016   Problems with swallowing and mastication    Stricture and stenosis of esophagus    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   Acid reflux 10/20/2014   Hx of colonic polyps    Benign neoplasm of transverse colon    Diverticulosis of large intestine without diverticulitis    Allergic rhinitis 08/01/2014   Anxiety 08/01/2014   HLD (hyperlipidemia) 08/01/2014   Essential hypertension  08/01/2014   Vitamin D deficiency 08/01/2014   H/O adenomatous polyp of colon 01/22/2012    Past Surgical History:  Procedure Laterality Date   ABDOMINAL HYSTERECTOMY  1987   CARDIAC CATHETERIZATION  02/2007   COLON SURGERY  03/12/2012   Dr Phylis Bougie   COLONOSCOPY WITH PROPOFOL N/A 09/26/2014   Procedure: COLONOSCOPY WITH PROPOFOL;  Surgeon: Lucilla Lame, MD;  Location: [place];  Service: Endoscopy;  Laterality: N/A;  marker (tattoo) used in colon   COLONOSCOPY WITH PROPOFOL N/A 10/27/2019   Procedure: COLONOSCOPY WITH PROPOFOL;  Surgeon: Robert Bellow, MD;  Location: [facility];  Service: Endoscopy;  Laterality: N/A;   ESOPHAGEAL DILATION N/A 02/02/2016   Procedure: ESOPHAGEAL DILATION;  Surgeon: Lucilla Lame, MD;  Location: [place];  Service: Endoscopy;  Laterality: N/A;   ESOPHAGOGASTRODUODENOSCOPY (EGD) WITH PROPOFOL N/A 02/02/2016   Procedure: ESOPHAGOGASTRODUODENOSCOPY (EGD) WITH PROPOFOL;  Surgeon: Lucilla Lame, MD;  Location: [place];  Service: Endoscopy;  Laterality: N/A;   LAPAROSCOPIC ABDOMINAL EXPLORATION N/A 11/08/2019   Procedure: LAPAROSCOPIC ABDOMINAL EXPLORATION;  Surgeon: Robert Bellow, MD;  Location: [facility];  Service: General;  Laterality: N/A;  POSSIBLE LAPAROTOMY   POLYPECTOMY  09/26/2014   Procedure: POLYPECTOMY;  Surgeon: Lucilla Lame, MD;  Location: [place];  Service: Endoscopy;;   TUBAL LIGATION  1972   VENTRAL HERNIA REPAIR N/A  11/08/2019   Procedure: HERNIA REPAIR VENTRAL ADULT;  Surgeon: Robert Bellow, MD;  Location: [facility];  Service: General;  Laterality: N/A;  possible ventral hernia repair    Prior to Admission medications   Medication Sig Start Date End Date Taking? Authorizing Provider  HYDROcodone-acetaminophen (NORCO/VICODIN) 5-325 MG tablet Take 1 tablet by mouth every 4 (four) hours as needed for up to 7 days for moderate pain. 08/23/20 08/30/20 Yes Arta Silence, MD  albuterol (VENTOLIN HFA)  108 (90 Base) MCG/ACT inhaler TAKE 2 PUFFS BY MOUTH EVERY 6 HOURS AS NEEDED FOR WHEEZE OR SHORTNESS OF BREATH 09/11/19   Birdie Sons, MD  ALPRAZolam Duanne Moron) 0.5 MG tablet Take 1 tablet (0.5 mg total) by mouth 3 (three) times daily as needed for anxiety or sleep. 05/12/20   Birdie Sons, MD  aspirin EC 81 MG tablet Take 81 mg by mouth daily.    [provider]  citalopram (CELEXA) 20 MG tablet Take 1 tablet (20 mg total) by mouth daily. 06/28/20   Birdie Sons, MD  fluticasone (FLONASE) 50 MCG/ACT nasal spray PLACE 1-2 SPRAYS INTO BOTH NOSTRILS DAILY AS NEEDED FOR ALLERGIES OR RHINITIS. 08/21/19   Birdie Sons, MD  ibuprofen (ADVIL) 800 MG tablet TAKE 1 TABLET (800 MG TOTAL) BY MOUTH DAILY AS NEEDED (SEVERE PAIN). 08/05/20   Birdie Sons, MD  omeprazole (PRILOSEC) 20 MG capsule Take 20 mg by mouth daily as needed (acid reflux).     [provider]  rosuvastatin (CRESTOR) 40 MG tablet Take 1 tablet (40 mg total) by mouth daily. 05/25/20 05/20/21  Marrianne Mood D, PA-C    Allergies Levofloxacin, Calcium channel blockers, Amoxicillin, Nickel, and Penicillins  Family History  Problem Relation Age of Onset   Diabetes Mother    Hypertension Mother    Mental illness Mother    Heart failure Mother 52   Cancer Father        lung cancer   Drug abuse Brother    Cancer Brother    Multiple sclerosis Brother     Social History Social History   Tobacco Use   Smoking status: Never   Smokeless tobacco: Never  Vaping Use   Vaping Use: Never used  Substance Use Topics   Alcohol use: No    Alcohol/week: 0.0 standard drinks   Drug use: No    Review of Systems  Constitutional: No fever. Gastrointestinal: No vomiting. Genitourinary: Negative for flank pain. Musculoskeletal: Negative for back or neck pain.  Positive for right ankle pain. Neurological: Negative for focal weakness or numbness.   ____________________________________________   PHYSICAL  EXAM:  VITAL SIGNS: ED Triage Vitals  Enc Vitals Group     BP 08/23/20 1527 127/69     Pulse Rate 08/23/20 1527 72     Resp 08/23/20 1527 15     Temp 08/23/20 1527 98.1 F (36.7 C)     Temp Source 08/23/20 1527 Oral     SpO2 08/23/20 1527 98 %     Weight 08/23/20 1528 172 lb (78 kg)     Height 08/23/20 1528 5\' 3"  (1.6 m)     Head Circumference --      Peak Flow --      Pain Score 08/23/20 1527 9     Pain Loc --      Pain Edu? --      Excl. in [place]? --     Constitutional: Alert and oriented. Well appearing and in no acute distress.  Eyes: Conjunctivae are normal.  Head: Atraumatic. Nose: No congestion/rhinnorhea. Mouth/Throat: Mucous membranes are moist.   Neck: Normal range of motion.  Cardiovascular: Normal rate, regular rhythm. Good peripheral circulation. Respiratory: Normal respiratory effort.  No retractions. Gastrointestinal: No distention.  Musculoskeletal: No lower extremity edema.  Extremities warm and well perfused.  Right ankle with moderate lateral malleolar tenderness and swelling.  Medial malleolus nontender.  2+ DP pulse. Neurologic:  Normal speech and language. No gross focal neurologic deficits are appreciated.  Motor and sensory intact right lower extremity. Skin:  Skin is warm and dry. No rash noted. Psychiatric: Mood and affect are normal. Speech and behavior are normal.  ____________________________________________   LABS (all labs ordered are listed, but only abnormal results are displayed)  Labs Reviewed - No data to display ____________________________________________  EKG   ____________________________________________  RADIOLOGY  XR R ankle: Minimally displaced fracture of the distal fibula.  ____________________________________________   PROCEDURES  Procedure(s) performed: No  Procedures  Critical Care performed: No ____________________________________________   INITIAL IMPRESSION / ASSESSMENT AND PLAN / ED COURSE  Pertinent  labs & imaging results that were available during my care of the patient were reviewed by me and considered in my medical decision making (see chart for details).   75 year old female with PMH as noted above presents with right ankle pain and swelling after mechanical fall from standing height.  She has no other injuries.  On exam the lateral aspect of the ankle is swollen and tender.  There is no tenderness to the medial malleolus.  The extremity is neuro/vascular intact.  X-ray reveals distal fibula fracture.  There also avulsion fragment seen adjacent to the medial malleolus, however given the lack of tenderness there and this is not consistent with acute fracture.  I consulted Dr. Roland Rack from orthopedics who recommends placing a splint and referring for Ortho follow-up next week.  ----------------------------------------- 6:47 PM on 08/23/2020 -----------------------------------------  Splint has been placed successfully by the technician.  The patient is stable for discharge.  I counseled her on the results of the imaging, the plan of care, and return precautions; she expressed understanding. ____________________________________________   FINAL CLINICAL IMPRESSION(S) / ED DIAGNOSES  Final diagnoses:  Closed fracture of distal end of right fibula, unspecified fracture morphology, initial encounter      NEW MEDICATIONS STARTED DURING THIS VISIT:  New Prescriptions   HYDROCODONE-ACETAMINOPHEN (NORCO/VICODIN) 5-325 MG TABLET    Take 1 tablet by mouth every 4 (four) hours as needed for up to 7 days for moderate pain.     Note:  This document was prepared using Dragon voice recognition software and may include unintentional dictation errors.    Arta Silence, MD 08/23/20 [phone]

## 2020-08-25 ENCOUNTER — Telehealth: Payer: Self-pay

## 2020-08-25 DIAGNOSIS — S82831A Other fracture of upper and lower end of right fibula, initial encounter for closed fracture: Secondary | ICD-10-CM

## 2020-08-25 NOTE — Telephone Encounter (Signed)
Copied from [place] [id number]. Topic: Referral - Request for Referral >> Aug 25, 2020 10:22 AM [name] wrote: Patient was seen in the ED on  08/23/2020 patient was referred to a orthopedic but would like PCP to refer her to [facility]. Patient would like a follow up call

## 2020-08-29 DIAGNOSIS — K439 Ventral hernia without obstruction or gangrene: Secondary | ICD-10-CM | POA: Diagnosis not present

## 2020-08-30 ENCOUNTER — Ambulatory Visit: Payer: Self-pay | Admitting: *Deleted

## 2020-08-30 DIAGNOSIS — F419 Anxiety disorder, unspecified: Secondary | ICD-10-CM

## 2020-08-30 DIAGNOSIS — F329 Major depressive disorder, single episode, unspecified: Secondary | ICD-10-CM

## 2020-08-30 DIAGNOSIS — S8264XA Nondisplaced fracture of lateral malleolus of right fibula, initial encounter for closed fracture: Secondary | ICD-10-CM | POA: Diagnosis not present

## 2020-08-30 NOTE — Patient Instructions (Signed)
Visit Information   Goals Addressed             This Visit's Progress    Manage My Emotions       Timeframe:  Long-Range Goal Priority:  High Start Date:  02/16/20                           Expected End Date: 08/17/19                     Follow Up Date 08/31/20   - continue to consider beginning personal counseling - talk about feelings with a friend, family or spiritual advisor - practice positive thinking and self-talk  -continue to prioritize self care    Why is this important?   When you are stressed, down or upset, your body reacts too.  For example, your blood pressure may get higher; you may have a headache or stomachache.  When your emotions get the best of you, your body's ability to fight off cold and flu gets weak.  These steps will help you manage your emotions.     Notes:          The patient verbalized understanding of instructions, educational materials, and care plan provided today and declined offer to receive copy of patient instructions, educational materials, and care plan.   Telephone follow up appointment with care management team member scheduled for:08/31/20   Elliot Gurney, [place] Worker  [facility] [phone]

## 2020-08-30 NOTE — Chronic Care Management (AMB) (Addendum)
Chronic Care Management    Clinical Social Work Note  08/30/2020 Name: [name] MRN: [id number] DOB: Oct 23, 1945  [name] is a 75 y.o. year old female who is a primary care patient of [name], [name], MD. The CCM team was consulted to assist the patient with chronic disease management and/or care coordination needs related to: Mental Health Counseling and Resources.   Engaged with patient by telephone for follow up visit in response to provider referral for social work chronic care management and care coordination services.   Consent to Services:  The patient was given information about Chronic Care Management services, agreed to services, and gave verbal consent prior to initiation of services.  Please see initial visit note for detailed documentation.   Patient agreed to services and consent obtained.   Assessment: Review of patient past medical history, allergies, medications, and health status, including review of relevant consultants reports was performed today as part of a comprehensive evaluation and provision of chronic care management and care coordination services.     SDOH (Social Determinants of Health) assessments and interventions performed:    Advanced Directives Status: Not addressed in this encounter.  CCM Care Plan  Allergies  Allergen Reactions   Levofloxacin     Tongue and mouth swelling   Calcium Channel Blockers     Other reaction(s): Dizziness   Amoxicillin Other (See Comments)    Causes yeast infection Has patient had a PCN reaction causing immediate rash, facial/tongue/throat swelling, SOB or lightheadedness with hypotension: No Has patient had a PCN reaction causing severe rash involving mucus membranes or skin necrosis: No Has patient had a PCN reaction that required hospitalization: No Has patient had a PCN reaction occurring within the last 10 years: No If all of the above answers are "NO", then may proceed with Cephalosporin use.     Nickel Rash   Penicillins Other (See Comments)    Causes yeast infection Has patient had a PCN reaction causing immediate rash, facial/tongue/throat swelling, SOB or lightheadedness with hypotension: No Has patient had a PCN reaction causing severe rash involving mucus membranes or skin necrosis: No Has patient had a PCN reaction that required hospitalization: No Has patient had a PCN reaction occurring within the last 10 years: No If all of the above answers are "NO", then may proceed with Cephalosporin use.    Outpatient Encounter Medications as of 08/30/2020  Medication Sig   albuterol (VENTOLIN HFA) 108 (90 Base) MCG/ACT inhaler TAKE 2 PUFFS BY MOUTH EVERY 6 HOURS AS NEEDED FOR WHEEZE OR SHORTNESS OF BREATH   ALPRAZolam (XANAX) 0.5 MG tablet Take 1 tablet (0.5 mg total) by mouth 3 (three) times daily as needed for anxiety or sleep.   aspirin EC 81 MG tablet Take 81 mg by mouth daily.   citalopram (CELEXA) 20 MG tablet Take 1 tablet (20 mg total) by mouth daily.   fluticasone (FLONASE) 50 MCG/ACT nasal spray PLACE 1-2 SPRAYS INTO BOTH NOSTRILS DAILY AS NEEDED FOR ALLERGIES OR RHINITIS.   HYDROcodone-acetaminophen (NORCO/VICODIN) 5-325 MG tablet Take 1 tablet by mouth every 4 (four) hours as needed for up to 7 days for moderate pain.   ibuprofen (ADVIL) 800 MG tablet TAKE 1 TABLET (800 MG TOTAL) BY MOUTH DAILY AS NEEDED (SEVERE PAIN).   omeprazole (PRILOSEC) 20 MG capsule Take 20 mg by mouth daily as needed (acid reflux).    rosuvastatin (CRESTOR) 40 MG tablet Take 1 tablet (40 mg total) by mouth daily.   No facility-administered  encounter medications on file as of 08/30/2020.    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   Acid reflux 10/20/2014   Hx of colonic polyps    Benign neoplasm of transverse colon    Diverticulosis of large intestine without diverticulitis    Allergic rhinitis 08/01/2014   Anxiety 08/01/2014   HLD (hyperlipidemia) 08/01/2014   Essential hypertension 08/01/2014   Vitamin D deficiency 08/01/2014   H/O adenomatous polyp of colon 01/22/2012    Conditions to be addressed/monitored: Depression; Mental Health Concerns   Care Plan : Depression (Adult)  Updates made by Vern Claude, LCSW since 08/30/2020 12:00 AM     Problem: Depression Identification (Depression)      Long-Range Goal: Depressive Symptoms Identified   Start Date: 02/16/2020  Expected End Date: 08/16/2020  This Visit's Progress: On track  Recent Progress: On track  Priority: High  Note:   Current Barriers:  Mental Health Concerns     Clinical Social Work Clinical Goal(s):  Over the next 90 days, patient will work with SW to address concerns related to identifying coping skills to manager her depression as well as connection her to a local mental health provider for ongoing mental health follow up   Interventions: 1:1 collaboration with [name], [name], MD regarding development and update of comprehensive plan of care as evidenced by provider attestation and co-signature Inter-disciplinary care team collaboration (see longitudinal plan of care) Patient's care giving responsibilities of family member continues to be explored Patient continues  to verbalizes feelings of fatigue due to care giving responsibilities however is able to confirm positive support from her son as they share in the care giving tasks Patient continues to be provided with emotional support and encouraged expression of feelings Self care emphasized  continues to be emphasized as well as accepting support from others Patient able to verbalize positive self management strategies, making sure that rest and time to herself is priority Patient provided with reassurance and positive reinforcement, caregiver stress normalized 08/30/20 Phone call from patient, discussed recent fall with fracture Patient confirmed that she is doing well, however continuing to prioritize self care while completing her caregiver duties Patient received a call during the call, and requested a return call tomorrow    Patient Goals/Self-Care Activities Over the next 90 days, patient will:   - Patient will attend all scheduled provider appointments Patient will attend church or other social activities - consider beginning personal counseling - talk about feelings with a friend, family or spiritual advisor - practice positive thinking and self-talk   Follow up Plan: SW will follow up with patient by phone over the next 14 business days        Follow Up Plan: SW will follow up with patient by phone over the next 7-14 business days       [place], [place] Worker  [facility] [phone]

## 2020-08-31 ENCOUNTER — Ambulatory Visit: Payer: Self-pay | Admitting: *Deleted

## 2020-08-31 DIAGNOSIS — F329 Major depressive disorder, single episode, unspecified: Secondary | ICD-10-CM

## 2020-08-31 DIAGNOSIS — F419 Anxiety disorder, unspecified: Secondary | ICD-10-CM

## 2020-08-31 NOTE — Patient Instructions (Signed)
Visit Information   Goals Addressed             This Visit's Progress    Manage My Emotions       Timeframe:  Long-Range Goal Priority:  High Start Date:  02/16/20                           Expected End Date: 08/17/19                     Follow Up Date 09/21/20   - continue to consider beginning personal counseling - talk about feelings with a friend, family or spiritual advisor - practice positive thinking and self-talk  -continue to prioritize self care    Why is this important?   When you are stressed, down or upset, your body reacts too.  For example, your blood pressure may get higher; you may have a headache or stomachache.  When your emotions get the best of you, your body's ability to fight off cold and flu gets weak.  These steps will help you manage your emotions.     Notes:         The patient verbalized understanding of instructions, educational materials, and care plan provided today and declined offer to receive copy of patient instructions, educational materials, and care plan.   Telephone follow up appointment with care management team member scheduled for: 09/07/20   Elliot Gurney, [place] Worker  [facility] [phone]

## 2020-08-31 NOTE — Chronic Care Management (AMB) (Signed)
Chronic Care Management    Clinical Social Work Note  08/31/2020 Name: AMALEE OLSEN MRN: [id number] DOB: May 31, 1945  [name] is a 75 y.o. year old female who is a primary care patient of [name], [name], MD. The CCM team was consulted to assist the patient with chronic disease management and/or care coordination needs related to: Mental Health Counseling and Resources.   Engaged with patient by telephone for follow up visit in response to provider referral for social work chronic care management and care coordination services.   Consent to Services:  The patient was given information about Chronic Care Management services, agreed to services, and gave verbal consent prior to initiation of services.  Please see initial visit note for detailed documentation.   Patient agreed to services and consent obtained.   Assessment: Review of patient past medical history, allergies, medications, and health status, including review of relevant consultants reports was performed today as part of a comprehensive evaluation and provision of chronic care management and care coordination services.     SDOH (Social Determinants of Health) assessments and interventions performed:    Advanced Directives Status: Not addressed in this encounter.  CCM Care Plan  Allergies  Allergen Reactions   Levofloxacin     Tongue and mouth swelling   Calcium Channel Blockers     Other reaction(s): Dizziness   Amoxicillin Other (See Comments)    Causes yeast infection Has patient had a PCN reaction causing immediate rash, facial/tongue/throat swelling, SOB or lightheadedness with hypotension: No Has patient had a PCN reaction causing severe rash involving mucus membranes or skin necrosis: No Has patient had a PCN reaction that required hospitalization: No Has patient had a PCN reaction occurring within the last 10 years: No If all of the above answers are "NO", then may proceed with Cephalosporin use.     Nickel Rash   Penicillins Other (See Comments)    Causes yeast infection Has patient had a PCN reaction causing immediate rash, facial/tongue/throat swelling, SOB or lightheadedness with hypotension: No Has patient had a PCN reaction causing severe rash involving mucus membranes or skin necrosis: No Has patient had a PCN reaction that required hospitalization: No Has patient had a PCN reaction occurring within the last 10 years: No If all of the above answers are "NO", then may proceed with Cephalosporin use.    Outpatient Encounter Medications as of 08/31/2020  Medication Sig   albuterol (VENTOLIN HFA) 108 (90 Base) MCG/ACT inhaler TAKE 2 PUFFS BY MOUTH EVERY 6 HOURS AS NEEDED FOR WHEEZE OR SHORTNESS OF BREATH   ALPRAZolam (XANAX) 0.5 MG tablet Take 1 tablet (0.5 mg total) by mouth 3 (three) times daily as needed for anxiety or sleep.   aspirin EC 81 MG tablet Take 81 mg by mouth daily.   citalopram (CELEXA) 20 MG tablet Take 1 tablet (20 mg total) by mouth daily.   fluticasone (FLONASE) 50 MCG/ACT nasal spray PLACE 1-2 SPRAYS INTO BOTH NOSTRILS DAILY AS NEEDED FOR ALLERGIES OR RHINITIS.   ibuprofen (ADVIL) 800 MG tablet TAKE 1 TABLET (800 MG TOTAL) BY MOUTH DAILY AS NEEDED (SEVERE PAIN).   omeprazole (PRILOSEC) 20 MG capsule Take 20 mg by mouth daily as needed (acid reflux).    rosuvastatin (CRESTOR) 40 MG tablet Take 1 tablet (40 mg total) by mouth daily.   No facility-administered encounter medications on file as of 08/31/2020.    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   Acid reflux 10/20/2014   Hx of colonic polyps    Benign neoplasm of transverse colon    Diverticulosis of large intestine without diverticulitis    Allergic rhinitis 08/01/2014   Anxiety 08/01/2014   HLD (hyperlipidemia) 08/01/2014   Essential hypertension 08/01/2014   Vitamin D deficiency 08/01/2014   H/O adenomatous polyp of colon 01/22/2012    Conditions to be addressed/monitored: Depression; Mental Health Concerns   Care Plan : Depression (Adult)  Updates made by Vern Claude, LCSW since 08/31/2020 12:00 AM     Problem: Depression Identification (Depression)      Long-Range Goal: Depressive Symptoms Identified   Start Date: 02/16/2020  Expected End Date: 08/16/2020  This Visit's Progress: On track  Recent Progress: On track  Priority: High  Note:   Current Barriers:  Mental Health Concerns     Clinical Social Work Clinical Goal(s):  Over the next 90 days, patient will work with SW to address concerns related to identifying coping skills to manager her depression as well as connection her to a local mental health provider for ongoing mental health follow up   Interventions: 1:1 collaboration with [name], [name], MD regarding development and update of comprehensive plan of care as evidenced by provider attestation and co-signature Inter-disciplinary care team collaboration (see longitudinal plan of care) Patient's care giving responsibilities of family member continues to be explored Patient continues  to verbalizes feelings of fatigue due to care giving responsibilities-patient now has a broken ankle and may also need hernia surgery Patient continues to be provided with emotional support and encouraged expression of feelings Self care emphasized continues to be emphasized as well as accepting support from others, in any form Patient able to verbalize positive self management strategies, making sure that rest and time to  herself is priority Patient provided with reassurance and positive reinforcement, caregiver stress normalized.  Patient Goals/Self-Care Activities Over the next 90 days, patient will:   - Patient will attend all scheduled provider appointments Patient will attend church or other social activities - consider beginning personal counseling - talk about feelings with a friend, family or spiritual advisor - practice positive thinking and self-talk   Follow up Plan: SW will follow up with patient by phone over the next 14 business days        Follow Up Plan: SW will follow up with patient by phone over the next 09/07/20       Elliot Gurney, [place] Worker  [facility] [phone]

## 2020-09-06 ENCOUNTER — Ambulatory Visit: Payer: Medicare Other | Admitting: Internal Medicine

## 2020-09-06 ENCOUNTER — Ambulatory Visit: Payer: Self-pay | Admitting: *Deleted

## 2020-09-06 DIAGNOSIS — F419 Anxiety disorder, unspecified: Secondary | ICD-10-CM

## 2020-09-06 DIAGNOSIS — F329 Major depressive disorder, single episode, unspecified: Secondary | ICD-10-CM

## 2020-09-06 NOTE — Chronic Care Management (AMB) (Signed)
Chronic Care Management    Clinical Social Work Note  09/06/2020 Name: [name] MRN: [id number] DOB: 04/03/1945  [name] is a 75 y.o. year old female who is a primary care patient of [name], [name], MD. The CCM team was consulted to assist the patient with chronic disease management and/or care coordination needs related to: Mental Health Counseling and Resources.   Engaged with patient by telephone for follow up visit in response to provider referral for social work chronic care management and care coordination services.   Consent to Services:  The patient was given information about Chronic Care Management services, agreed to services, and gave verbal consent prior to initiation of services.  Please see initial visit note for detailed documentation.   Patient agreed to services and consent obtained.   Assessment: Review of patient past medical history, allergies, medications, and health status, including review of relevant consultants reports was performed today as part of a comprehensive evaluation and provision of chronic care management and care coordination services.     SDOH (Social Determinants of Health) assessments and interventions performed:    Advanced Directives Status: Not addressed in this encounter.  CCM Care Plan  Allergies  Allergen Reactions   Levofloxacin     Tongue and mouth swelling   Calcium Channel Blockers     Other reaction(s): Dizziness   Amoxicillin Other (See Comments)    Causes yeast infection Has patient had a PCN reaction causing immediate rash, facial/tongue/throat swelling, SOB or lightheadedness with hypotension: No Has patient had a PCN reaction causing severe rash involving mucus membranes or skin necrosis: No Has patient had a PCN reaction that required hospitalization: No Has patient had a PCN reaction occurring within the last 10 years: No If all of the above answers are "NO", then may proceed with Cephalosporin use.     Nickel Rash   Penicillins Other (See Comments)    Causes yeast infection Has patient had a PCN reaction causing immediate rash, facial/tongue/throat swelling, SOB or lightheadedness with hypotension: No Has patient had a PCN reaction causing severe rash involving mucus membranes or skin necrosis: No Has patient had a PCN reaction that required hospitalization: No Has patient had a PCN reaction occurring within the last 10 years: No If all of the above answers are "NO", then may proceed with Cephalosporin use.    Outpatient Encounter Medications as of 09/06/2020  Medication Sig   albuterol (VENTOLIN HFA) 108 (90 Base) MCG/ACT inhaler TAKE 2 PUFFS BY MOUTH EVERY 6 HOURS AS NEEDED FOR WHEEZE OR SHORTNESS OF BREATH   ALPRAZolam (XANAX) 0.5 MG tablet Take 1 tablet (0.5 mg total) by mouth 3 (three) times daily as needed for anxiety or sleep.   aspirin EC 81 MG tablet Take 81 mg by mouth daily.   citalopram (CELEXA) 20 MG tablet Take 1 tablet (20 mg total) by mouth daily.   fluticasone (FLONASE) 50 MCG/ACT nasal spray PLACE 1-2 SPRAYS INTO BOTH NOSTRILS DAILY AS NEEDED FOR ALLERGIES OR RHINITIS.   ibuprofen (ADVIL) 800 MG tablet TAKE 1 TABLET (800 MG TOTAL) BY MOUTH DAILY AS NEEDED (SEVERE PAIN).   omeprazole (PRILOSEC) 20 MG capsule Take 20 mg by mouth daily as needed (acid reflux).    rosuvastatin (CRESTOR) 40 MG tablet Take 1 tablet (40 mg total) by mouth daily.   No facility-administered encounter medications on file as of 09/06/2020.    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   Acid reflux 10/20/2014   Hx of colonic polyps    Benign neoplasm of transverse colon    Diverticulosis of large intestine without diverticulitis    Allergic rhinitis 08/01/2014   Anxiety 08/01/2014   HLD (hyperlipidemia) 08/01/2014   Essential hypertension 08/01/2014   Vitamin D deficiency 08/01/2014   H/O adenomatous polyp of colon 01/22/2012    Conditions to be addressed/monitored: Depression; Mental Health Concerns   Care Plan : Depression (Adult)  Updates made by Vern Claude, LCSW since 09/06/2020 12:00 AM     Problem: Depression Identification (Depression)      Long-Range Goal: Depressive Symptoms Identified   Start Date: 02/16/2020  Expected End Date: 08/16/2020  This Visit's Progress: On track  Recent Progress: On track  Priority: High  Note:   Current Barriers:  Mental Health Concerns     Clinical Social Work Clinical Goal(s):  Over the next 90 days, patient will work with SW to address concerns related to identifying coping skills to manager her depression as well as connection her to a local mental health provider for ongoing mental health follow up   Interventions: 1:1 collaboration with [name], [name], MD regarding development and update of comprehensive plan of care as evidenced by provider attestation and co-signature Inter-disciplinary care team collaboration (see longitudinal plan of care) Patient continues to be provided with emotional support and positive reinforcement due to increased care giving responsibilities of family member Patient continues  to verbalizes feelings of fatigue due to care giving responsibilities-patient now has a broken ankle(MD follow up in approximately 3 weeks) and may also need hernia surgery Patient currently with love one as he completes another medical procedure Reflective listening, encouragement and reassurance provided Self care emphasized continues to be emphasized as well  as accepting support from others, in any form Patient able to verbalize positive self management strategies, making sure that rest and time to herself is priority  Patient Goals/Self-Care Activities Over the next 90 days, patient will:   - Patient will attend all scheduled provider appointments Patient will attend church or other social activities - consider beginning personal counseling - talk about feelings with a friend, family or spiritual advisor - practice positive thinking and self-talk   Follow up Plan: SW will follow up with patient by phone over the next 14 business days        Follow Up Plan: SW will follow up with patient by phone over the next 14 business days       [place], [place] Worker  [facility] [phone]

## 2020-09-06 NOTE — Patient Instructions (Addendum)
Visit Information   Goals Addressed             This Visit's Progress    Manage My Emotions       Timeframe:  Long-Range Goal Priority:  High Start Date:  02/16/20                           Expected End Date: 08/17/19                     Follow Up Date 09/21/20   - continue to consider beginning personal counseling - talk about feelings with a friend, family or spiritual advisor - practice positive thinking and self-talk  -continue to prioritize self care -follow up with medical provider when needd    Why is this important?   When you are stressed, down or upset, your body reacts too.  For example, your blood pressure may get higher; you may have a headache or stomachache.  When your emotions get the best of you, your body's ability to fight off cold and flu gets weak.  These steps will help you manage your emotions.     Notes:         The patient verbalized understanding of instructions, educational materials, and care plan provided today and declined offer to receive copy of patient instructions, educational materials, and care plan.   Telephone follow up appointment with care management team member scheduled for: 09/20/20   Elliot Gurney, [place] Worker  [facility] [phone]

## 2020-09-07 ENCOUNTER — Telehealth: Payer: Self-pay

## 2020-09-19 DIAGNOSIS — S8264XA Nondisplaced fracture of lateral malleolus of right fibula, initial encounter for closed fracture: Secondary | ICD-10-CM | POA: Diagnosis not present

## 2020-09-20 ENCOUNTER — Telehealth: Payer: Self-pay

## 2020-09-27 ENCOUNTER — Telehealth: Payer: Self-pay | Admitting: *Deleted

## 2020-09-27 NOTE — Telephone Encounter (Signed)
  Care Management   Follow Up Note   09/27/2020 Name: NEZZIE MAYER MRN: [id number] DOB: 1946-01-16   Referred by: Birdie Sons, MD Reason for referral : No chief complaint on file.   Follow up phone call to patient, however patient was not able to talk at the time of the call  Follow Up Plan: Telephone follow up appointment with care management team member scheduled for: 09/28/20   Elliot Gurney, [place] Worker  [facility] [phone]

## 2020-09-28 ENCOUNTER — Telehealth: Payer: Self-pay

## 2020-10-03 ENCOUNTER — Ambulatory Visit (INDEPENDENT_AMBULATORY_CARE_PROVIDER_SITE_OTHER): Payer: Medicare Other | Admitting: *Deleted

## 2020-10-03 ENCOUNTER — Other Ambulatory Visit: Payer: Self-pay | Admitting: Family Medicine

## 2020-10-03 DIAGNOSIS — F419 Anxiety disorder, unspecified: Secondary | ICD-10-CM

## 2020-10-03 DIAGNOSIS — G8929 Other chronic pain: Secondary | ICD-10-CM

## 2020-10-03 DIAGNOSIS — F329 Major depressive disorder, single episode, unspecified: Secondary | ICD-10-CM | POA: Diagnosis not present

## 2020-10-03 NOTE — Patient Instructions (Signed)
Visit Information   Goals Addressed             This Visit's Progress    Manage My Emotions       Timeframe:  Long-Range Goal Priority:  High Start Date:  02/16/20                           Expected End Date: 08/16/20                     Follow Up Date 10/17/20   - continue to consider beginning personal counseling - talk about feelings with a friend, family or spiritual advisor - practice positive thinking and self-talk  -continue to prioritize self care -follow up with medical provider when needd    Why is this important?   When you are stressed, down or upset, your body reacts too.  For example, your blood pressure may get higher; you may have a headache or stomachache.  When your emotions get the best of you, your body's ability to fight off cold and flu gets weak.  These steps will help you manage your emotions.     Notes:         The patient verbalized understanding of instructions, educational materials, and care plan provided today and declined offer to receive copy of patient instructions, educational materials, and care plan.   Telephone follow up appointment with care management team member scheduled for:10/17/20   Elliot Gurney, [place] Worker  [facility] [phone]

## 2020-10-03 NOTE — Chronic Care Management (AMB) (Signed)
Chronic Care Management    Clinical Social Work Note  10/03/2020 Name: [name] MRN: [id number] DOB: [birth year]  [name] is a 75 y.o. year old female who is a primary care patient of [name], [name], MD. The CCM team was consulted to assist the patient with chronic disease management and/or care coordination needs related to: Mental Health Counseling and Resources.   Engaged with patient by telephone for follow up visit in response to provider referral for social work chronic care management and care coordination services.   Consent to Services:  The patient was given information about Chronic Care Management services, agreed to services, and gave verbal consent prior to initiation of services.  Please see initial visit note for detailed documentation.   Patient agreed to services and consent obtained.   Assessment: Review of patient past medical history, allergies, medications, and health status, including review of relevant consultants reports was performed today as part of a comprehensive evaluation and provision of chronic care management and care coordination services.     SDOH (Social Determinants of Health) assessments and interventions performed:    Advanced Directives Status: Not addressed in this encounter.  CCM Care Plan  Allergies  Allergen Reactions   Levofloxacin     Tongue and mouth swelling   Calcium Channel Blockers     Other reaction(s): Dizziness   Amoxicillin Other (See Comments)    Causes yeast infection Has patient had a PCN reaction causing immediate rash, facial/tongue/throat swelling, SOB or lightheadedness with hypotension: No Has patient had a PCN reaction causing severe rash involving mucus membranes or skin necrosis: No Has patient had a PCN reaction that required hospitalization: No Has patient had a PCN reaction occurring within the last 10 years: No If all of the above answers are "NO", then may proceed with Cephalosporin use.     Nickel Rash   Penicillins Other (See Comments)    Causes yeast infection Has patient had a PCN reaction causing immediate rash, facial/tongue/throat swelling, SOB or lightheadedness with hypotension: No Has patient had a PCN reaction causing severe rash involving mucus membranes or skin necrosis: No Has patient had a PCN reaction that required hospitalization: No Has patient had a PCN reaction occurring within the last 10 years: No If all of the above answers are "NO", then may proceed with Cephalosporin use.    Outpatient Encounter Medications as of 10/03/2020  Medication Sig   albuterol (VENTOLIN HFA) 108 (90 Base) MCG/ACT inhaler TAKE 2 PUFFS BY MOUTH EVERY 6 HOURS AS NEEDED FOR WHEEZE OR SHORTNESS OF BREATH   ALPRAZolam (XANAX) 0.5 MG tablet Take 1 tablet (0.5 mg total) by mouth 3 (three) times daily as needed for anxiety or sleep.   aspirin EC 81 MG tablet Take 81 mg by mouth daily.   citalopram (CELEXA) 20 MG tablet Take 1 tablet (20 mg total) by mouth daily.   fluticasone (FLONASE) 50 MCG/ACT nasal spray PLACE 1-2 SPRAYS INTO BOTH NOSTRILS DAILY AS NEEDED FOR ALLERGIES OR RHINITIS.   ibuprofen (ADVIL) 800 MG tablet TAKE 1 TABLET (800 MG TOTAL) BY MOUTH DAILY AS NEEDED (SEVERE PAIN).   omeprazole (PRILOSEC) 20 MG capsule Take 20 mg by mouth daily as needed (acid reflux).    rosuvastatin (CRESTOR) 40 MG tablet Take 1 tablet (40 mg total) by mouth daily.   No facility-administered encounter medications on file as of 10/03/2020.    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  Palpitations 04/14/2020   Pure hypercholesterolemia 04/14/2020   Small bowel obstruction (HCC)    Partial small bowel obstruction ([place]) 02/04/2018   SBO (small bowel obstruction) ([place]) 03/13/2017   Panic disorder 02/21/2016   Weakness of both lower extremities 02/21/2016   Depression 02/21/2016   Problems with swallowing and mastication    Stricture and stenosis of  esophagus    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   Acid reflux 10/20/2014   Hx of colonic polyps    Benign neoplasm of transverse colon    Diverticulosis of large intestine without diverticulitis    Allergic rhinitis 08/01/2014   Anxiety 08/01/2014   HLD (hyperlipidemia) 08/01/2014   Essential hypertension 08/01/2014   Vitamin D deficiency 08/01/2014   H/O adenomatous polyp of colon 01/22/2012    Conditions to be addressed/monitored: Depression; Mental Health Concerns   Care Plan : Depression (Adult)  Updates made by Vern Claude, LCSW since 10/03/2020 12:00 AM     Problem: Depression Identification (Depression)      Long-Range Goal: Depressive Symptoms Identified   Start Date: 02/16/2020  Expected End Date: 08/16/2020  This Visit's Progress: On track  Recent Progress: On track  Priority: High  Note:   Current Barriers:  Mental Health Concerns     Clinical Social Work Clinical Goal(s):  Over the next 90 days, patient will work with SW to address concerns related to identifying coping skills to manager her depression as well as connection her to a local mental health provider for ongoing mental health follow up   Interventions: 1:1 collaboration with [name], [name], MD regarding development and update of comprehensive plan of care as evidenced by provider attestation and co-signature Inter-disciplinary care team collaboration (see longitudinal plan of care) Patient continues to be provided with emotional support and positive reinforcement due to increased care giving responsibilities of family member Patient continues  to verbalizes feelings of fatigue and caregiver stress due to care giving responsibilities Verbalization of feelings encouraged Reflective listening/ emotional support provided Self care continues to be emphasized as well as accepting support from others, in any form Patient able to verbalize positive self management strategies, making sure that rest and time  to herself is priority-patient planning time a way to obtain needed rest  Patient Goals/Self-Care Activities Over the next 90 days, patient will:   - Patient will attend all scheduled provider appointments Patient will attend church or other social activities - consider beginning personal counseling - talk about feelings with a friend, family or spiritual advisor - practice positive thinking and self-talk   Follow up Plan: SW will follow up with patient by phone over the next 14 business days        Follow Up Plan: SW will follow up with patient by phone over the next 14 business days       [employer], [place] Worker  [facility] [phone]

## 2020-10-11 DIAGNOSIS — S8261XA Displaced fracture of lateral malleolus of right fibula, initial encounter for closed fracture: Secondary | ICD-10-CM | POA: Insufficient documentation

## 2020-10-11 DIAGNOSIS — S8264XB Nondisplaced fracture of lateral malleolus of right fibula, initial encounter for open fracture type I or II: Secondary | ICD-10-CM | POA: Diagnosis not present

## 2020-10-11 DIAGNOSIS — S8264XA Nondisplaced fracture of lateral malleolus of right fibula, initial encounter for closed fracture: Secondary | ICD-10-CM | POA: Diagnosis not present

## 2020-10-11 HISTORY — DX: Displaced fracture of lateral malleolus of right fibula, initial encounter for closed fracture: S82.61XA

## 2020-10-19 ENCOUNTER — Telehealth: Payer: Self-pay

## 2020-10-19 ENCOUNTER — Other Ambulatory Visit: Payer: Self-pay | Admitting: Family Medicine

## 2020-10-19 NOTE — Telephone Encounter (Signed)
Copied from [place] [id number]. Topic: Appointment Scheduling - Scheduling Inquiry for Clinic >> Oct 19, 2020 11:06 AM [name] Blinks wrote: Reason for CRM: Pt wants to have her A1C checked, and a full panel.   Best contact: [phone]

## 2020-10-19 NOTE — Telephone Encounter (Unsigned)
Copied from [place] [id number]. Topic: Quick Communication - Rx Refill/Question >> Oct 19, 2020  4:47 PM Yvette Rack wrote: Medication: ALPRAZolam Duanne Moron) 0.5 MG tablet  Has the patient contacted their pharmacy? Yes.   (Agent: If no, request that the patient contact the pharmacy for the refill.) (Agent: If yes, when and what did the pharmacy advise?)  Preferred Pharmacy (with phone number or street name): [id number] [address]  Phone: [phone]  Fax: [phone] Agent: Please be advised that RX refills may take up to 3 business days. We ask that you follow-up with your pharmacy.

## 2020-10-20 ENCOUNTER — Ambulatory Visit (INDEPENDENT_AMBULATORY_CARE_PROVIDER_SITE_OTHER): Payer: Medicare Other | Admitting: Family Medicine

## 2020-10-20 ENCOUNTER — Other Ambulatory Visit: Payer: Self-pay

## 2020-10-20 ENCOUNTER — Encounter: Payer: Self-pay | Admitting: Family Medicine

## 2020-10-20 VITALS — BP 134/83 | HR 73 | Ht 63.0 in | Wt 178.6 lb

## 2020-10-20 DIAGNOSIS — F419 Anxiety disorder, unspecified: Secondary | ICD-10-CM

## 2020-10-20 DIAGNOSIS — I10 Essential (primary) hypertension: Secondary | ICD-10-CM

## 2020-10-20 DIAGNOSIS — E7800 Pure hypercholesterolemia, unspecified: Secondary | ICD-10-CM | POA: Diagnosis not present

## 2020-10-20 DIAGNOSIS — R739 Hyperglycemia, unspecified: Secondary | ICD-10-CM

## 2020-10-20 MED ORDER — ALPRAZOLAM 0.5 MG PO TABS: 0.5000 mg | ORAL_TABLET | Freq: Three times a day (TID) | ORAL | 3 refills | Status: DC | PRN

## 2020-10-20 NOTE — Telephone Encounter (Signed)
Requested medications are on the active medication list yes  Last visit 04/25/20  Future visit scheduled no  Notes to clinic Not Delegated.

## 2020-10-20 NOTE — Telephone Encounter (Signed)
Appt made for 10/20/2020 at 11:20

## 2020-10-20 NOTE — Progress Notes (Signed)
      Established patient visit   Patient: [name]   DOB: 1945-06-07   75 y.o. Female  MRN: [id number] Visit Date: 10/20/2020  Today's healthcare provider: Lelon Huh, MD   Chief Complaint  Patient presents with   Follow-up   Hyperlipidemia   Subjective  -------------------------------------------------------------------------------------------------------------------- HPI  She is here to follow up hyperlipidemia.  Lab Results  Component Value Date   CHOL 225 (H) 05/23/2020   HDL 48 05/23/2020   LDLCALC 165 (H) 05/23/2020   TRIG 71 05/23/2020   CHOLHDL 4.7 (H) 05/23/2020    Started on Crestor by cardiology in April and is tolerating well with no adverse effects.    Medications: Outpatient Medications Prior to Visit  Medication Sig   albuterol (VENTOLIN HFA) 108 (90 Base) MCG/ACT inhaler TAKE 2 PUFFS BY MOUTH EVERY 6 HOURS AS NEEDED FOR WHEEZE OR SHORTNESS OF BREATH   aspirin EC 81 MG tablet Take 81 mg by mouth daily.   citalopram (CELEXA) 20 MG tablet Take 1 tablet (20 mg total) by mouth daily.   fluticasone (FLONASE) 50 MCG/ACT nasal spray PLACE 1-2 SPRAYS INTO BOTH NOSTRILS DAILY AS NEEDED FOR ALLERGIES OR RHINITIS.   ibuprofen (ADVIL) 800 MG tablet TAKE 1 TABLET (800 MG TOTAL) BY MOUTH DAILY AS NEEDED (SEVERE PAIN).   omeprazole (PRILOSEC) 20 MG capsule Take 20 mg by mouth daily as needed (acid reflux).    rosuvastatin (CRESTOR) 40 MG tablet Take 1 tablet (40 mg total) by mouth daily.   [DISCONTINUED] ALPRAZolam (XANAX) 0.5 MG tablet Take 1 tablet (0.5 mg total) by mouth 3 (three) times daily as needed for anxiety or sleep.   No facility-administered medications prior to visit.        Objective  -------------------------------------------------------------------------------------------------------------------- BP 134/83 (BP Location: Right Arm, Patient Position: Sitting, Cuff Size: Normal)   Pulse 73   Ht '5\' 3"'$  (1.6 m)   Wt 178 lb 9.6 oz (81 kg)   SpO2  96%   BMI 31.64 kg/m   Physical Exam  General appearance: Mildly obese female, cooperative and in no acute distress Head: Normocephalic, without obvious abnormality, atraumatic Respiratory: Respirations even and unlabored, normal respiratory rate Extremities: All extremities are intact.  Skin: Skin color, texture, turgor normal. No rashes seen  Psych: Appropriate mood and affect. Neurologic: Mental status: Alert, oriented to person, place, and time, thought content appropriate.     Assessment & Plan  ---------------------------------------------------------------------------------------------------------------------- 1. Essential hypertension Well controlled.  Continue current medications.    2. Pure hypercholesterolemia She is tolerating rosuvastatin well with no adverse effects.   - Lipid panel - Comprehensive metabolic panel  3. Hyperglycemia  - HgB A1c  4. Anxiety refill ALPRAZolam (XANAX) 0.5 MG tablet; Take 1 tablet (0.5 mg total) by mouth 3 (three) times daily as needed for anxiety or sleep.  Dispense: 90 tablet; Refill: 3         The entirety of the information documented in the History of Present Illness, Review of Systems and Physical Exam were personally obtained by me. Portions of this information were initially documented by the CMA and reviewed by me for thoroughness and accuracy.     Lelon Huh, MD  [facility] [phone] (phone) [phone] (fax)  [place]

## 2020-10-20 NOTE — Progress Notes (Deleted)
      Established patient visit   Patient: [name]   DOB: [birth year]   75 y.o. Female  MRN: [id number] Visit Date: 10/20/2020  Today's healthcare provider: Lelon Huh, MD   No chief complaint on file.  Subjective  -------------------------------------------------------------------------------------------------------------------- HPI  Patient is requesting to have her A1c checked and a full lab panel.   Lipid/Cholesterol, Follow-up  Last lipid panel Other pertinent labs  Lab Results  Component Value Date   CHOL 225 (H) 05/23/2020   HDL 48 05/23/2020   LDLCALC 165 (H) 05/23/2020   LDLDIRECT 217 (H) 04/29/2017   TRIG 71 05/23/2020   CHOLHDL 4.7 (H) 05/23/2020   Lab Results  Component Value Date   ALT 17 05/23/2020   AST 21 05/23/2020   PLT 203 04/02/2020   TSH 1.460 08/11/2018     She was last seen for this 8 months ago.  Management since that visit includes continue medication.  She reports good compliance with treatment. She is not having side effects.   Symptoms: No chest pain No chest pressure/discomfort  No dyspnea No lower extremity edema  No numbness or tingling of extremity No orthopnea  No palpitations No paroxysmal nocturnal dyspnea  No speech difficulty No syncope   Current diet: well balanced, high fiber Current exercise: none  The 10-year ASCVD risk score Mikey Bussing DC Jr., et al., 2013) is: 12.4%   ---------------------------------------------------------------------------------------------------  ---------------------------------------------------------------------------------------------------   {Link to patient history deactivated due to formatting error:1}  Medications: Outpatient Medications Prior to Visit  Medication Sig   albuterol (VENTOLIN HFA) 108 (90 Base) MCG/ACT inhaler TAKE 2 PUFFS BY MOUTH EVERY 6 HOURS AS NEEDED FOR WHEEZE OR SHORTNESS OF BREATH   ALPRAZolam (XANAX) 0.5 MG tablet Take 1 tablet (0.5 mg total) by mouth 3  (three) times daily as needed for anxiety or sleep.   aspirin EC 81 MG tablet Take 81 mg by mouth daily.   citalopram (CELEXA) 20 MG tablet Take 1 tablet (20 mg total) by mouth daily.   fluticasone (FLONASE) 50 MCG/ACT nasal spray PLACE 1-2 SPRAYS INTO BOTH NOSTRILS DAILY AS NEEDED FOR ALLERGIES OR RHINITIS.   ibuprofen (ADVIL) 800 MG tablet TAKE 1 TABLET (800 MG TOTAL) BY MOUTH DAILY AS NEEDED (SEVERE PAIN).   omeprazole (PRILOSEC) 20 MG capsule Take 20 mg by mouth daily as needed (acid reflux).    rosuvastatin (CRESTOR) 40 MG tablet Take 1 tablet (40 mg total) by mouth daily.   No facility-administered medications prior to visit.    Review of Systems  {Labs  Heme  Chem  Endocrine  Serology  Results Review (optional):23779}   Objective  -------------------------------------------------------------------------------------------------------------------- There were no vitals taken for this visit. {Show previous vital signs (optional):23777}  Physical Exam  ***  No results found for any visits on 10/20/20.  Assessment & Plan  ---------------------------------------------------------------------------------------------------------------------- ***  No follow-ups on file.      {provider attestation***:1}   Lelon Huh, MD  [facility] [phone] (phone) [phone] (fax)  [place]

## 2020-10-21 LAB — COMPREHENSIVE METABOLIC PANEL
ALT: 22 IU/L (ref 0–32)
AST: 21 IU/L (ref 0–40)
Albumin/Globulin Ratio: 1.6 (ref 1.2–2.2)
Albumin: 4.4 g/dL (ref 3.7–4.7)
Alkaline Phosphatase: 72 IU/L (ref 44–121)
BUN/Creatinine Ratio: 12 (ref 12–28)
BUN: 10 mg/dL (ref 8–27)
Bilirubin Total: 0.8 mg/dL (ref 0.0–1.2)
CO2: 23 mmol/L (ref 20–29)
Calcium: 9 mg/dL (ref 8.7–10.3)
Chloride: 102 mmol/L (ref 96–106)
Creatinine, Ser: 0.86 mg/dL (ref 0.57–1.00)
Globulin, Total: 2.8 g/dL (ref 1.5–4.5)
Glucose: 84 mg/dL (ref 65–99)
Potassium: 4.6 mmol/L (ref 3.5–5.2)
Sodium: 138 mmol/L (ref 134–144)
Total Protein: 7.2 g/dL (ref 6.0–8.5)
eGFR: 72 mL/min/{1.73_m2} (ref >59)

## 2020-10-21 LAB — HEMOGLOBIN A1C
Est. average glucose Bld gHb Est-mCnc: 128 mg/dL
Hgb A1c MFr Bld: 6.1 % — ABNORMAL HIGH (ref 4.8–5.6)

## 2020-10-21 LAB — LIPID PANEL
Chol/HDL Ratio: 2.4 ratio (ref 0.0–4.4)
Cholesterol, Total: 124 mg/dL (ref 100–199)
HDL: 51 mg/dL (ref >39)
LDL Chol Calc (NIH): 62 mg/dL (ref 0–99)
Triglycerides: 44 mg/dL (ref 0–149)
VLDL Cholesterol Cal: 11 mg/dL (ref 5–40)

## 2020-10-24 ENCOUNTER — Telehealth: Payer: Self-pay | Admitting: *Deleted

## 2020-10-24 NOTE — Telephone Encounter (Signed)
Attempted to call pt, no answer. Unable to leave vm, voicemail box is full.

## 2020-10-24 NOTE — Telephone Encounter (Signed)
-----   Message from Arvil Chaco, PA-C sent at 10/21/2020 10:24 PM EDT ----- Kermit Balo news!  LDL at goal of below 70 and significantly improved from previous labs. Excellent results. Very reassuring!  Continue current statin. No changes to current medications.

## 2020-10-27 NOTE — Telephone Encounter (Signed)
Reviewed the patient's chart.  She was notified of results by Dr. Maralyn Sago office on 10/24/20.  See result note.

## 2020-11-15 ENCOUNTER — Telehealth: Payer: Medicare Other | Admitting: *Deleted

## 2020-11-16 ENCOUNTER — Ambulatory Visit (INDEPENDENT_AMBULATORY_CARE_PROVIDER_SITE_OTHER): Payer: Medicare Other | Admitting: *Deleted

## 2020-11-16 DIAGNOSIS — F329 Major depressive disorder, single episode, unspecified: Secondary | ICD-10-CM

## 2020-11-16 DIAGNOSIS — I10 Essential (primary) hypertension: Secondary | ICD-10-CM

## 2020-11-16 DIAGNOSIS — F419 Anxiety disorder, unspecified: Secondary | ICD-10-CM

## 2020-11-16 NOTE — Chronic Care Management (AMB) (Signed)
Chronic Care Management    Clinical Social Work Note  11/16/2020 Name: [name] MRN: [id number] DOB: January 19, 1946  [name] is a 75 y.o. year old female who is a primary care patient of [name], [name], MD. The CCM team was consulted to assist the patient with chronic disease management and/or care coordination needs related to: Mental Health Counseling and Resources.   Engaged with patient by telephone for follow up visit in response to provider referral for social work chronic care management and care coordination services.   Consent to Services:  The patient was given information about Chronic Care Management services, agreed to services, and gave verbal consent prior to initiation of services.  Please see initial visit note for detailed documentation.   Patient agreed to services and consent obtained.   Assessment: Review of patient past medical history, allergies, medications, and health status, including review of relevant consultants reports was performed today as part of a comprehensive evaluation and provision of chronic care management and care coordination services.     SDOH (Social Determinants of Health) assessments and interventions performed:    Advanced Directives Status: Not addressed in this encounter.  CCM Care Plan  Allergies  Allergen Reactions   Levofloxacin     Tongue and mouth swelling   Calcium Channel Blockers     Other reaction(s): Dizziness   Amoxicillin Other (See Comments)    Causes yeast infection Has patient had a PCN reaction causing immediate rash, facial/tongue/throat swelling, SOB or lightheadedness with hypotension: No Has patient had a PCN reaction causing severe rash involving mucus membranes or skin necrosis: No Has patient had a PCN reaction that required hospitalization: No Has patient had a PCN reaction occurring within the last 10 years: No If all of the above answers are "NO", then may proceed with Cephalosporin use.     Nickel Rash   Penicillins Other (See Comments)    Causes yeast infection Has patient had a PCN reaction causing immediate rash, facial/tongue/throat swelling, SOB or lightheadedness with hypotension: No Has patient had a PCN reaction causing severe rash involving mucus membranes or skin necrosis: No Has patient had a PCN reaction that required hospitalization: No Has patient had a PCN reaction occurring within the last 10 years: No If all of the above answers are "NO", then may proceed with Cephalosporin use.    Outpatient Encounter Medications as of 11/16/2020  Medication Sig   albuterol (VENTOLIN HFA) 108 (90 Base) MCG/ACT inhaler TAKE 2 PUFFS BY MOUTH EVERY 6 HOURS AS NEEDED FOR WHEEZE OR SHORTNESS OF BREATH   ALPRAZolam (XANAX) 0.5 MG tablet Take 1 tablet (0.5 mg total) by mouth 3 (three) times daily as needed for anxiety or sleep.   aspirin EC 81 MG tablet Take 81 mg by mouth daily.   citalopram (CELEXA) 20 MG tablet Take 1 tablet (20 mg total) by mouth daily.   fluticasone (FLONASE) 50 MCG/ACT nasal spray PLACE 1-2 SPRAYS INTO BOTH NOSTRILS DAILY AS NEEDED FOR ALLERGIES OR RHINITIS.   ibuprofen (ADVIL) 800 MG tablet TAKE 1 TABLET (800 MG TOTAL) BY MOUTH DAILY AS NEEDED (SEVERE PAIN).   omeprazole (PRILOSEC) 20 MG capsule Take 20 mg by mouth daily as needed (acid reflux).    rosuvastatin (CRESTOR) 40 MG tablet Take 1 tablet (40 mg total) by mouth daily.   No facility-administered encounter medications on file as of 11/16/2020.    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   Acid reflux 10/20/2014   Hx of colonic polyps    Benign neoplasm of transverse colon    Diverticulosis of large intestine without diverticulitis    Allergic rhinitis 08/01/2014   Anxiety 08/01/2014   HLD (hyperlipidemia) 08/01/2014   Essential hypertension 08/01/2014   Vitamin D deficiency 08/01/2014   H/O adenomatous polyp of colon 01/22/2012    Conditions to be addressed/monitored: Depression; Mental Health Concerns   Care Plan : Depression (Adult)  Updates made by Vern Claude, LCSW since 11/16/2020 12:00 AM     Problem: Depression Identification (Depression)      Long-Range Goal: Depressive Symptoms Identified   Start Date: 02/16/2020  Expected End Date: 01/17/2021  Recent Progress: On track  Priority: High  Note:   Current Barriers:  Mental Health Concerns     Clinical Social Work Clinical Goal(s):  Over the next 90 days, patient will work with SW to address concerns related to identifying coping skills to manager her depression as well as connection her to a local mental health provider for ongoing mental health follow up   Interventions: 1:1 collaboration with [name], [name], MD regarding development and update of comprehensive plan of care as evidenced by provider attestation and co-signature Inter-disciplinary care team collaboration (see longitudinal plan of care) Patient continues to be provided with emotional support and positive reinforcement due to increased care giving responsibilities as multiple family members  being hospitalized Patient continues  to verbalizes feelings of fatigue and caregiver stress due to care giving responsibilities Self Care emphasized, talking to trusted friends, maintaining personal boundaries, allowing herself to take personal breaks to re-charge Verbalization of feelings encouraged Reflective listening/ emotional support provided Self care continues to be emphasized as well as  accepting support from others, in any form Patient able to verbalize positive self management strategies, making sure that rest and time to herself is priority-patient planning time a way to obtain needed rest  Patient Goals/Self-Care Activities Over the next 90 days, patient will:   - Patient will attend all scheduled provider appointments Patient will attend church or other social activities - consider beginning personal counseling - talk about feelings with a friend, family or spiritual advisor - practice positive thinking and self-talk   Follow up Plan: SW will follow up with patient by phone over the next 14 business days        Follow Up Plan: SW will follow up with patient by phone over the next 14-21 business days       [place], [place] Worker  [place] [facility] [phone]

## 2020-11-16 NOTE — Patient Instructions (Signed)
Visit Information   Goals Addressed             This Visit's Progress    Manage My Emotions       Timeframe:  Long-Range Goal Priority:  High Start Date:  02/16/20                           Expected End Date: 08/16/20                     Follow Up Date 12/07/20   - talk about feelings with a friend, family or spiritual advisor - practice positive thinking and self-talk  -continue to prioritize self care -follow up with medical provider when needed    Why is this important?   When you are stressed, down or upset, your body reacts too.  For example, your blood pressure may get higher; you may have a headache or stomachache.  When your emotions get the best of you, your body's ability to fight off cold and flu gets weak.  These steps will help you manage your emotions.     Notes:         The patient verbalized understanding of instructions, educational materials, and care plan provided today and declined offer to receive copy of patient instructions, educational materials, and care plan.   Telephone follow up appointment with care management team member scheduled for: 12/07/20   Elliot Gurney, [place] Worker  [facility] [phone]

## 2020-11-17 DIAGNOSIS — F329 Major depressive disorder, single episode, unspecified: Secondary | ICD-10-CM | POA: Diagnosis not present

## 2020-11-17 DIAGNOSIS — I10 Essential (primary) hypertension: Secondary | ICD-10-CM | POA: Diagnosis not present

## 2020-11-20 ENCOUNTER — Telehealth: Payer: Self-pay

## 2020-11-20 NOTE — Telephone Encounter (Signed)
Copied from [place] [id number]. Topic: General - Inquiry >> Nov 20, 2020  3:50 PM Greggory Keen D wrote: Reason for CRM: Pt called saying she needs a paper for a second handicap sticker for her truck.  She wants to know if Dr [name] will sign for her one.

## 2020-12-07 ENCOUNTER — Ambulatory Visit (INDEPENDENT_AMBULATORY_CARE_PROVIDER_SITE_OTHER): Payer: Medicare Other | Admitting: *Deleted

## 2020-12-07 DIAGNOSIS — F419 Anxiety disorder, unspecified: Secondary | ICD-10-CM

## 2020-12-07 DIAGNOSIS — F329 Major depressive disorder, single episode, unspecified: Secondary | ICD-10-CM

## 2020-12-07 NOTE — Patient Instructions (Signed)
Visit Information   Goals Addressed             This Visit's Progress    Manage My Emotions       Timeframe:  Long-Range Goal Priority:  High Start Date:  02/16/20                           Expected End Date: 08/16/20                     Follow Up Date 12/21/20   - talk about feelings with a friend, family or spiritual advisor - practice positive thinking and self-talk  -continue to prioritize self care -follow up with medical provider when needed    Why is this important?   When you are stressed, down or upset, your body reacts too.  For example, your blood pressure may get higher; you may have a headache or stomachache.  When your emotions get the best of you, your body's ability to fight off cold and flu gets weak.  These steps will help you manage your emotions.     Notes:         The patient verbalized understanding of instructions, educational materials, and care plan provided today and declined offer to receive copy of patient instructions, educational materials, and care plan.   Telephone follow up appointment with care management team member scheduled for: 12/21/20   Elliot Gurney, [place] Worker  [facility] [phone]

## 2020-12-07 NOTE — Chronic Care Management (AMB) (Signed)
Chronic Care Management    Clinical Social Work Note  12/07/2020 Name: [name] MRN: [id number] DOB: October 26, 1945  [name] is a 75 y.o. year old female who is a primary care patient of [name], [name], MD. The CCM team was consulted to assist the patient with chronic disease management and/or care coordination needs related to: Mental Health Counseling and Resources.   Engaged with patient by telephone for follow up visit in response to provider referral for social work chronic care management and care coordination services.   Consent to Services:  The patient was given information about Chronic Care Management services, agreed to services, and gave verbal consent prior to initiation of services.  Please see initial visit note for detailed documentation.   Patient agreed to services and consent obtained.   Assessment: Review of patient past medical history, allergies, medications, and health status, including review of relevant consultants reports was performed today as part of a comprehensive evaluation and provision of chronic care management and care coordination services.     SDOH (Social Determinants of Health) assessments and interventions performed:    Advanced Directives Status: Not addressed in this encounter.  CCM Care Plan  Allergies  Allergen Reactions   Levofloxacin     Tongue and mouth swelling   Calcium Channel Blockers     Other reaction(s): Dizziness   Amoxicillin Other (See Comments)    Causes yeast infection Has patient had a PCN reaction causing immediate rash, facial/tongue/throat swelling, SOB or lightheadedness with hypotension: No Has patient had a PCN reaction causing severe rash involving mucus membranes or skin necrosis: No Has patient had a PCN reaction that required hospitalization: No Has patient had a PCN reaction occurring within the last 10 years: No If all of the above answers are "NO", then may proceed with Cephalosporin use.     Nickel Rash   Penicillins Other (See Comments)    Causes yeast infection Has patient had a PCN reaction causing immediate rash, facial/tongue/throat swelling, SOB or lightheadedness with hypotension: No Has patient had a PCN reaction causing severe rash involving mucus membranes or skin necrosis: No Has patient had a PCN reaction that required hospitalization: No Has patient had a PCN reaction occurring within the last 10 years: No If all of the above answers are "NO", then may proceed with Cephalosporin use.    Outpatient Encounter Medications as of 12/07/2020  Medication Sig   albuterol (VENTOLIN HFA) 108 (90 Base) MCG/ACT inhaler TAKE 2 PUFFS BY MOUTH EVERY 6 HOURS AS NEEDED FOR WHEEZE OR SHORTNESS OF BREATH   ALPRAZolam (XANAX) 0.5 MG tablet Take 1 tablet (0.5 mg total) by mouth 3 (three) times daily as needed for anxiety or sleep.   aspirin EC 81 MG tablet Take 81 mg by mouth daily.   citalopram (CELEXA) 20 MG tablet Take 1 tablet (20 mg total) by mouth daily.   fluticasone (FLONASE) 50 MCG/ACT nasal spray PLACE 1-2 SPRAYS INTO BOTH NOSTRILS DAILY AS NEEDED FOR ALLERGIES OR RHINITIS.   ibuprofen (ADVIL) 800 MG tablet TAKE 1 TABLET (800 MG TOTAL) BY MOUTH DAILY AS NEEDED (SEVERE PAIN).   omeprazole (PRILOSEC) 20 MG capsule Take 20 mg by mouth daily as needed (acid reflux).    rosuvastatin (CRESTOR) 40 MG tablet Take 1 tablet (40 mg total) by mouth daily.   No facility-administered encounter medications on file as of 12/07/2020.    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   Acid reflux 10/20/2014   Hx of colonic polyps    Benign neoplasm of transverse colon    Diverticulosis of large intestine without diverticulitis    Allergic rhinitis 08/01/2014   Anxiety 08/01/2014   HLD (hyperlipidemia) 08/01/2014   Essential hypertension 08/01/2014   Vitamin D deficiency 08/01/2014   H/O adenomatous polyp of colon 01/22/2012    Conditions to be addressed/monitored: Depression; Mental Health Concerns   Care Plan : Depression (Adult)  Updates made by Vern Claude, LCSW since 12/07/2020 12:00 AM     Problem: Depression Identification (Depression)      Long-Range Goal: Depressive Symptoms Identified   Start Date: 02/16/2020  Expected End Date: 01/17/2021  This Visit's Progress: On track  Recent Progress: On track  Priority: High  Note:   Current Barriers:  Mental Health Concerns     Clinical Social Work Clinical Goal(s):  Over the next 90 days, patient will work with SW to address concerns related to identifying coping skills to manager her depression as well as connection her to a local mental health provider for ongoing mental health follow up   Interventions: 1:1 collaboration with [name], [name], MD regarding development and update of comprehensive plan of care as evidenced by provider attestation and co-signature Inter-disciplinary care team collaboration (see longitudinal plan of care) Continued to be provided with emotional support and positive reinforcement due to increased care giving responsibilities as multiple family members  being hospitalized Patient now preparing for Hernia surgery-consultation scheduled for 12/21/20 Caregiver stress normalized Self care [place] personal boundaries emphasized Verbalization of feelings encouraged Reflective listening/ emotional support provided   Patient Goals/Self-Care Activities Over the next 90 days, patient will:   - Patient will attend all  scheduled provider appointments Patient will attend church or other social activities - consider beginning personal counseling - talk about feelings with a friend, family or spiritual advisor - practice positive thinking and self-talk   Follow up Plan: SW will follow up with patient by phone over the next 14 business days        Follow Up Plan: SW will follow up with patient by phone over the next 14 business days       [employer], [place] Worker  [facility] [phone]

## 2020-12-08 ENCOUNTER — Ambulatory Visit: Payer: Self-pay | Admitting: *Deleted

## 2020-12-08 NOTE — Telephone Encounter (Signed)
Summary: urinary discomfort   The patient is experiencing a burning sensation when they urinate as well as back discomfort   The patient shares that they've been experiencing their symptoms for roughly three days   The patient would like to be prescribed something to help with their symptoms   Please contact further when available      Reason for Disposition  [1] SEVERE pain with urination (e.g., excruciating) AND [2] not improved after 2 hours of pain medicine and Sitz bath  Answer Assessment - Initial Assessment Questions 1. SEVERITY: "How bad is the pain?"  (e.g., Scale 1-10; mild, moderate, or severe)   - MILD (1-3): complains slightly about urination hurting   - MODERATE (4-7): interferes with normal activities     - SEVERE (8-10): excruciating, unwilling or unable to urinate because of the pain      Burning with urination- back pain- severe 2. FREQUENCY: "How many times have you had painful urination today?"      Every time 3. PATTERN: "Is pain present every time you urinate or just sometimes?"      Every time 4. ONSET: "When did the painful urination start?"       3 days 5. FEVER: "Do you have a fever?" If Yes, ask: "What is your temperature, how was it measured, and when did it start?"     Yes- worse last night- 99 6. PAST UTI: "Have you had a urine infection before?" If Yes, ask: "When was the last time?" and "What happened that time?"      Yes- long time- treated with antibiotic 7. CAUSE: "What do you think is causing the painful urination?"  (e.g., UTI, scratch, Herpes sore)     UTI 8. OTHER SYMPTOMS: "Do you have any other symptoms?" (e.g., flank pain, vaginal discharge, genital sores, urgency, blood in urine)     Back pain, urgency 9. PREGNANCY: "Is there any chance you are pregnant?" "When was your last menstrual period?"     N/a  Protocols used: Urination Pain - Female-A-AH

## 2020-12-08 NOTE — Telephone Encounter (Signed)
FYI

## 2020-12-08 NOTE — Telephone Encounter (Signed)
Patient is calling with urinary symptoms- she is certain she has UTI. Advised virtual option- she does not want to go to UC- she is going to try that now. Advised her to call back if she needs assistance.

## 2020-12-11 ENCOUNTER — Telehealth: Payer: Medicare Other | Admitting: Physician Assistant

## 2020-12-11 DIAGNOSIS — R3989 Other symptoms and signs involving the genitourinary system: Secondary | ICD-10-CM | POA: Diagnosis not present

## 2020-12-11 MED ORDER — CEPHALEXIN 500 MG PO CAPS
500.0000 mg | ORAL_CAPSULE | Freq: Two times a day (BID) | ORAL | 0 refills | Status: DC
Start: 2020-12-11 — End: 2021-02-20

## 2020-12-11 NOTE — Progress Notes (Signed)

## 2020-12-12 ENCOUNTER — Telehealth: Payer: Self-pay | Admitting: Family Medicine

## 2020-12-12 DIAGNOSIS — J069 Acute upper respiratory infection, unspecified: Secondary | ICD-10-CM

## 2020-12-12 MED ORDER — FLUCONAZOLE 150 MG PO TABS: 150.0000 mg | ORAL_TABLET | Freq: Once | ORAL | 0 refills | Status: AC

## 2020-12-12 NOTE — Addendum Note (Signed)
Addended by: Birdie Sons on: 12/12/2020 01:29 PM   Modules accepted: Orders

## 2020-12-12 NOTE — Telephone Encounter (Signed)
Medication Refill - Medication:  fluconazole (DIFLUCAN) 150 MG tablet  Has the patient contacted their pharmacy? Yes.   Contact PCP  Preferred Pharmacy (with phone number or street name):  [id number] - [place], [place] Phone:  [phone]  Fax:  [phone]      Has the patient been seen for an appointment in the last year OR does the patient have an upcoming appointment? Yes.    Pt called in stating when she normally starts taking a antibiotics she has to take Diflucan because the antibiotics gives her a yeast infection, please advise.  Agent: Please be advised that RX refills may take up to 3 business days. We ask that you follow-up with your pharmacy.

## 2020-12-12 NOTE — Telephone Encounter (Signed)
Diflucan not an active med list- routing to [facility].

## 2020-12-18 DIAGNOSIS — F329 Major depressive disorder, single episode, unspecified: Secondary | ICD-10-CM | POA: Diagnosis not present

## 2020-12-21 ENCOUNTER — Ambulatory Visit (INDEPENDENT_AMBULATORY_CARE_PROVIDER_SITE_OTHER): Payer: Medicare Other | Admitting: *Deleted

## 2020-12-21 DIAGNOSIS — I10 Essential (primary) hypertension: Secondary | ICD-10-CM

## 2020-12-21 DIAGNOSIS — F329 Major depressive disorder, single episode, unspecified: Secondary | ICD-10-CM

## 2020-12-21 DIAGNOSIS — F419 Anxiety disorder, unspecified: Secondary | ICD-10-CM

## 2020-12-21 NOTE — Patient Instructions (Signed)
Visit Information  PATIENT GOALS/PLAN OF CARE:  Care Plan : Depression (Adult)  Updates made by Vern Claude, LCSW since 12/21/2020 12:00 AM     Problem: Depression Identification (Depression)      Long-Range Goal: Depressive Symptoms Identified   Start Date: 02/16/2020  Expected End Date: 01/17/2021  This Visit's Progress: On track  Recent Progress: On track  Priority: High  Note:   Current Barriers:  Mental Health Concerns     Clinical Social Work Clinical Goal(s):  Over the next 90 days, patient will work with SW to address concerns related to identifying coping skills to manager her depression as well as connection her to a local mental health provider for ongoing mental health follow up   Interventions: 1:1 collaboration with [name], [name], MD regarding development and update of comprehensive plan of care as evidenced by provider attestation and co-signature Inter-disciplinary care team collaboration (see longitudinal plan of care) Continued to be provided with emotional support and positive reinforcement due to  care giving responsibilities Patient now preparing for Hernia surgery-consultation has been re-scheduled for 12/28/20-feelings regarding surgery explored/processed Caregiver stress normalized Self care [place] personal boundaries emphasized Verbalization of feelings encouraged Reflective listening/ emotional support provided   Patient Goals/Self-Care Activities Over the next 90 days, patient will:   - Patient will attend all scheduled provider appointments Patient will attend church or other social activities - consider beginning personal counseling - talk about feelings with a friend, family or spiritual advisor - practice positive thinking and self-talk   Follow up Plan: SW will follow up with patient by phone over the next 14 business days      The patient verbalized understanding of instructions, educational materials, and care plan  provided today and declined offer to receive copy of patient instructions, educational materials, and care plan.   Telephone follow up appointment with care management team member scheduled for: 01/04/21   Elliot Gurney, [place] Worker  [facility] [phone]

## 2020-12-21 NOTE — Chronic Care Management (AMB) (Signed)
Chronic Care Management    Clinical Social Work Note  12/21/2020 Name: [name] MRN: [id number] DOB: 05-06-1945  [name] is a 75 y.o. year old female who is a primary care patient of [name], [name], MD. The CCM team was consulted to assist the patient with chronic disease management and/or care coordination needs related to: Mental Health Counseling and Resources.   Engaged with patient by telephone for follow up visit in response to provider referral for social work chronic care management and care coordination services.   Consent to Services:  The patient was given information about Chronic Care Management services, agreed to services, and gave verbal consent prior to initiation of services.  Please see initial visit note for detailed documentation.   Patient agreed to services and consent obtained.   Assessment: Review of patient past medical history, allergies, medications, and health status, including review of relevant consultants reports was performed today as part of a comprehensive evaluation and provision of chronic care management and care coordination services.     SDOH (Social Determinants of Health) assessments and interventions performed:    Advanced Directives Status: Not addressed in this encounter.  CCM Care Plan  Allergies  Allergen Reactions   Levofloxacin     Tongue and mouth swelling   Calcium Channel Blockers     Other reaction(s): Dizziness   Amoxicillin Other (See Comments)    Causes yeast infection Has patient had a PCN reaction causing immediate rash, facial/tongue/throat swelling, SOB or lightheadedness with hypotension: No Has patient had a PCN reaction causing severe rash involving mucus membranes or skin necrosis: No Has patient had a PCN reaction that required hospitalization: No Has patient had a PCN reaction occurring within the last 10 years: No If all of the above answers are "NO", then may proceed with Cephalosporin use.     Nickel Rash   Penicillins Other (See Comments)    Causes yeast infection Has patient had a PCN reaction causing immediate rash, facial/tongue/throat swelling, SOB or lightheadedness with hypotension: No Has patient had a PCN reaction causing severe rash involving mucus membranes or skin necrosis: No Has patient had a PCN reaction that required hospitalization: No Has patient had a PCN reaction occurring within the last 10 years: No If all of the above answers are "NO", then may proceed with Cephalosporin use.    Outpatient Encounter Medications as of 12/21/2020  Medication Sig   albuterol (VENTOLIN HFA) 108 (90 Base) MCG/ACT inhaler TAKE 2 PUFFS BY MOUTH EVERY 6 HOURS AS NEEDED FOR WHEEZE OR SHORTNESS OF BREATH   ALPRAZolam (XANAX) 0.5 MG tablet Take 1 tablet (0.5 mg total) by mouth 3 (three) times daily as needed for anxiety or sleep.   aspirin EC 81 MG tablet Take 81 mg by mouth daily.   cephALEXin (KEFLEX) 500 MG capsule Take 1 capsule (500 mg total) by mouth 2 (two) times daily.   citalopram (CELEXA) 20 MG tablet Take 1 tablet (20 mg total) by mouth daily.   fluticasone (FLONASE) 50 MCG/ACT nasal spray PLACE 1-2 SPRAYS INTO BOTH NOSTRILS DAILY AS NEEDED FOR ALLERGIES OR RHINITIS.   ibuprofen (ADVIL) 800 MG tablet TAKE 1 TABLET (800 MG TOTAL) BY MOUTH DAILY AS NEEDED (SEVERE PAIN).   omeprazole (PRILOSEC) 20 MG capsule Take 20 mg by mouth daily as needed (acid reflux).    rosuvastatin (CRESTOR) 40 MG tablet Take 1 tablet (40 mg total) by mouth daily.   No facility-administered encounter medications on file as of 12/21/2020.  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   Acid reflux 10/20/2014   Hx of colonic polyps    Benign neoplasm of transverse colon    Diverticulosis of large intestine without diverticulitis    Allergic rhinitis 08/01/2014   Anxiety 08/01/2014   HLD (hyperlipidemia) 08/01/2014   Essential hypertension 08/01/2014   Vitamin D deficiency 08/01/2014   H/O adenomatous polyp of colon 01/22/2012    Conditions to be addressed/monitored:  Depression; Mental Health Concerns  Care Plan : Depression (Adult)  Updates made by Vern Claude, LCSW since 12/21/2020 12:00 AM     Problem: Depression Identification (Depression)      Long-Range Goal: Depressive Symptoms Identified   Start Date: 02/16/2020  Expected End Date: 01/17/2021  This Visit's Progress: On track  Recent Progress: On track  Priority: High  Note:   Current Barriers:  Mental Health Concerns     Clinical Social Work Clinical Goal(s):  Over the next 90 days, patient will work with SW to address concerns related to identifying coping skills to manager her depression as well as connection her to a local mental health provider for ongoing mental health follow up   Interventions: 1:1 collaboration with [name], [name], MD regarding development and update of comprehensive plan of care as evidenced by provider attestation and co-signature Inter-disciplinary care team collaboration (see longitudinal plan of care) Continued to be provided with emotional support and positive reinforcement due to  care giving responsibilities Patient now preparing for Hernia surgery-consultation has been re-scheduled for 12/28/20-feelings regarding surgery explored/processed Caregiver stress normalized Self care [place] personal boundaries emphasized Verbalization of feelings encouraged Reflective listening/ emotional support provided   Patient  Goals/Self-Care Activities Over the next 90 days, patient will:   - Patient will attend all scheduled provider appointments Patient will attend church or other social activities - consider beginning personal counseling - talk about feelings with a friend, family or spiritual advisor - practice positive thinking and self-talk   Follow up Plan: SW will follow up with patient by phone over the next 14 business days        Follow Up Plan: SW will follow up with patient by phone over the next 14 business days       [employer], [place] Worker  [place] [facility] [phone]

## 2020-12-28 DIAGNOSIS — K439 Ventral hernia without obstruction or gangrene: Secondary | ICD-10-CM | POA: Diagnosis not present

## 2021-01-02 ENCOUNTER — Telehealth: Payer: Medicare Other

## 2021-01-04 ENCOUNTER — Ambulatory Visit: Payer: Medicare Other | Admitting: *Deleted

## 2021-01-04 DIAGNOSIS — F329 Major depressive disorder, single episode, unspecified: Secondary | ICD-10-CM

## 2021-01-04 DIAGNOSIS — F419 Anxiety disorder, unspecified: Secondary | ICD-10-CM

## 2021-01-04 NOTE — Chronic Care Management (AMB) (Signed)
Chronic Care Management    Clinical Social Work Note  01/04/2021 Name: [name] MRN: [id number] DOB: 09-07-1945  [name] is a 75 y.o. year old female who is a primary care patient of [name], Wanda Peri, MD. The CCM team was consulted to assist the patient with chronic disease management and/or care coordination needs related to: Mental Health Counseling and Resources.   Engaged with patient by telephone for follow up visit in response to provider referral for social work chronic care management and care coordination services.   Consent to Services:  The patient was given information about Chronic Care Management services, agreed to services, and gave verbal consent prior to initiation of services.  Please see initial visit note for detailed documentation.   Patient agreed to services and consent obtained.   Assessment: Review of patient past medical history, allergies, medications, and health status, including review of relevant consultants reports was performed today as part of a comprehensive evaluation and provision of chronic care management and care coordination services.     SDOH (Social Determinants of Health) assessments and interventions performed:    Advanced Directives Status: Not addressed in this encounter.  CCM Care Plan  Allergies  Allergen Reactions   Levofloxacin     Tongue and mouth swelling   Calcium Channel Blockers     Other reaction(s): Dizziness   Amoxicillin Other (See Comments)    Causes yeast infection Has patient had a PCN reaction causing immediate rash, facial/tongue/throat swelling, SOB or lightheadedness with hypotension: No Has patient had a PCN reaction causing severe rash involving mucus membranes or skin necrosis: No Has patient had a PCN reaction that required hospitalization: No Has patient had a PCN reaction occurring within the last 10 years: No If all of the above answers are "NO", then may proceed with Cephalosporin use.     Nickel Rash   Penicillins Other (See Comments)    Causes yeast infection Has patient had a PCN reaction causing immediate rash, facial/tongue/throat swelling, SOB or lightheadedness with hypotension: No Has patient had a PCN reaction causing severe rash involving mucus membranes or skin necrosis: No Has patient had a PCN reaction that required hospitalization: No Has patient had a PCN reaction occurring within the last 10 years: No If all of the above answers are "NO", then may proceed with Cephalosporin use.    Outpatient Encounter Medications as of 01/04/2021  Medication Sig   albuterol (VENTOLIN HFA) 108 (90 Base) MCG/ACT inhaler TAKE 2 PUFFS BY MOUTH EVERY 6 HOURS AS NEEDED FOR WHEEZE OR SHORTNESS OF BREATH   ALPRAZolam (XANAX) 0.5 MG tablet Take 1 tablet (0.5 mg total) by mouth 3 (three) times daily as needed for anxiety or sleep.   aspirin EC 81 MG tablet Take 81 mg by mouth daily.   cephALEXin (KEFLEX) 500 MG capsule Take 1 capsule (500 mg total) by mouth 2 (two) times daily.   citalopram (CELEXA) 20 MG tablet Take 1 tablet (20 mg total) by mouth daily.   fluticasone (FLONASE) 50 MCG/ACT nasal spray PLACE 1-2 SPRAYS INTO BOTH NOSTRILS DAILY AS NEEDED FOR ALLERGIES OR RHINITIS.   ibuprofen (ADVIL) 800 MG tablet TAKE 1 TABLET (800 MG TOTAL) BY MOUTH DAILY AS NEEDED (SEVERE PAIN).   omeprazole (PRILOSEC) 20 MG capsule Take 20 mg by mouth daily as needed (acid reflux).    rosuvastatin (CRESTOR) 40 MG tablet Take 1 tablet (40 mg total) by mouth daily.   No facility-administered encounter medications on file as of 01/04/2021.  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   Acid reflux 10/20/2014   Hx of colonic polyps    Benign neoplasm of transverse colon    Diverticulosis of large intestine without diverticulitis    Allergic rhinitis 08/01/2014   Anxiety 08/01/2014   HLD (hyperlipidemia) 08/01/2014   Essential hypertension 08/01/2014   Vitamin D deficiency 08/01/2014   H/O adenomatous polyp of colon 01/22/2012    Conditions to be addressed/monitored: Depression; Mental Health Concerns   Care Plan : Depression (Adult)  Updates made by Wanda Claude, LCSW since 01/04/2021 12:00 AM     Problem: Depression Identification (Depression)      Long-Range Goal: Depressive Symptoms Identified   Start Date: 02/16/2020  Expected End Date: 01/17/2021  This Visit's Progress: On track  Recent Progress: On track  Priority: High  Note:   Current Barriers:  Mental Health Concerns     Clinical Social Work Clinical Goal(s):  Over the next 90 days, patient will work with SW to address concerns related to identifying coping skills to manager her depression as well as connection her to a local mental health provider for ongoing mental health follow up   Interventions: 1:1 collaboration with [name], Wanda Peri, MD regarding development and update of comprehensive plan of care as evidenced by provider attestation and co-signature Inter-disciplinary care team collaboration (see longitudinal plan of care) Continued to be provided with emotional support and positive reinforcement due to  care giving responsibilities and management of her own medical concerns Confirmed hernia consultation completed on  12/28/20 resulting in no surgery needed Relationship concerns processed, positive coping strategies explored Self care [place] personal boundaries emphasized Verbalization of feelings encouraged Reflective listening/ emotional  support provided   Patient Goals/Self-Care Activities Over the next 90 days, patient will:   - Patient will attend all scheduled provider appointments Patient will attend church or other social activities - consider beginning personal counseling - talk about feelings with a friend, family or spiritual advisor - practice positive thinking and self-talk   Follow up Plan: SW will follow up with patient by phone over the next 14 business days        Follow Up Plan: SW will follow up with patient by phone over the next 14 business days       [employer], [place] Worker  [facility] [phone]

## 2021-01-04 NOTE — Patient Instructions (Signed)
Visit Information  PATIENT GOALS/PLAN OF CARE:  Care Plan : Depression (Adult)  Updates made by Vern Claude, LCSW since 01/04/2021 12:00 AM     Problem: Depression Identification (Depression)      Long-Range Goal: Depressive Symptoms Identified   Start Date: 02/16/2020  Expected End Date: 01/17/2021  This Visit's Progress: On track  Recent Progress: On track  Priority: High  Note:   Current Barriers:  Mental Health Concerns     Clinical Social Work Clinical Goal(s):  Over the next 90 days, patient will work with SW to address concerns related to identifying coping skills to manager her depression as well as connection her to a local mental health provider for ongoing mental health follow up   Interventions: 1:1 collaboration with [name], [name], MD regarding development and update of comprehensive plan of care as evidenced by provider attestation and co-signature Inter-disciplinary care team collaboration (see longitudinal plan of care) Continued to be provided with emotional support and positive reinforcement due to  care giving responsibilities and management of her own medical concerns Confirmed hernia consultation completed on  12/28/20 resulting in no surgery needed Relationship concerns processed, positive coping strategies explored Self care [place] personal boundaries emphasized Verbalization of feelings encouraged Reflective listening/ emotional support provided   Patient Goals/Self-Care Activities Over the next 90 days, patient will:   - Patient will attend all scheduled provider appointments Patient will attend church or other social activities - consider beginning personal counseling - talk about feelings with a friend, family or spiritual advisor - practice positive thinking and self-talk   Follow up Plan: SW will follow up with patient by phone over the next 14 business days      The patient verbalized understanding of instructions,  educational materials, and care plan provided today and declined offer to receive copy of patient instructions, educational materials, and care plan.   Telephone follow up appointment with care management team member scheduled for: 01/18/21   Elliot Gurney, [place] Worker  [facility] [phone]

## 2021-01-18 ENCOUNTER — Ambulatory Visit (INDEPENDENT_AMBULATORY_CARE_PROVIDER_SITE_OTHER): Payer: Medicare Other | Admitting: *Deleted

## 2021-01-18 DIAGNOSIS — F329 Major depressive disorder, single episode, unspecified: Secondary | ICD-10-CM

## 2021-01-18 DIAGNOSIS — F419 Anxiety disorder, unspecified: Secondary | ICD-10-CM

## 2021-01-18 NOTE — Chronic Care Management (AMB) (Signed)
Chronic Care Management    Clinical Social Work Note  01/18/2021 Name: [name] MRN: [id number] DOB: September 20, 1945  [name] is a 75 y.o. year old female who is a primary care patient of [name], [name], MD. The CCM team was consulted to assist the patient with chronic disease management and/or care coordination needs related to: Mental Health Counseling and Resources.   Engaged with patient by telephone for follow up visit in response to provider referral for social work chronic care management and care coordination services.   Consent to Services:  The patient was given information about Chronic Care Management services, agreed to services, and gave verbal consent prior to initiation of services.  Please see initial visit note for detailed documentation.   Patient agreed to services and consent obtained.   Assessment: Review of patient past medical history, allergies, medications, and health status, including review of relevant consultants reports was performed today as part of a comprehensive evaluation and provision of chronic care management and care coordination services.     SDOH (Social Determinants of Health) assessments and interventions performed:    Advanced Directives Status: Not addressed in this encounter.  CCM Care Plan  Allergies  Allergen Reactions   Levofloxacin     Tongue and mouth swelling   Calcium Channel Blockers     Other reaction(s): Dizziness   Amoxicillin Other (See Comments)    Causes yeast infection Has patient had a PCN reaction causing immediate rash, facial/tongue/throat swelling, SOB or lightheadedness with hypotension: No Has patient had a PCN reaction causing severe rash involving mucus membranes or skin necrosis: No Has patient had a PCN reaction that required hospitalization: No Has patient had a PCN reaction occurring within the last 10 years: No If all of the above answers are "NO", then may proceed with Cephalosporin use.     Nickel Rash   Penicillins Other (See Comments)    Causes yeast infection Has patient had a PCN reaction causing immediate rash, facial/tongue/throat swelling, SOB or lightheadedness with hypotension: No Has patient had a PCN reaction causing severe rash involving mucus membranes or skin necrosis: No Has patient had a PCN reaction that required hospitalization: No Has patient had a PCN reaction occurring within the last 10 years: No If all of the above answers are "NO", then may proceed with Cephalosporin use.    Outpatient Encounter Medications as of 01/18/2021  Medication Sig   albuterol (VENTOLIN HFA) 108 (90 Base) MCG/ACT inhaler TAKE 2 PUFFS BY MOUTH EVERY 6 HOURS AS NEEDED FOR WHEEZE OR SHORTNESS OF BREATH   ALPRAZolam (XANAX) 0.5 MG tablet Take 1 tablet (0.5 mg total) by mouth 3 (three) times daily as needed for anxiety or sleep.   aspirin EC 81 MG tablet Take 81 mg by mouth daily.   cephALEXin (KEFLEX) 500 MG capsule Take 1 capsule (500 mg total) by mouth 2 (two) times daily.   citalopram (CELEXA) 20 MG tablet Take 1 tablet (20 mg total) by mouth daily.   fluticasone (FLONASE) 50 MCG/ACT nasal spray PLACE 1-2 SPRAYS INTO BOTH NOSTRILS DAILY AS NEEDED FOR ALLERGIES OR RHINITIS.   ibuprofen (ADVIL) 800 MG tablet TAKE 1 TABLET (800 MG TOTAL) BY MOUTH DAILY AS NEEDED (SEVERE PAIN).   omeprazole (PRILOSEC) 20 MG capsule Take 20 mg by mouth daily as needed (acid reflux).    rosuvastatin (CRESTOR) 40 MG tablet Take 1 tablet (40 mg total) by mouth daily.   No facility-administered encounter medications on file as of 01/18/2021.  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   Palpitations 04/14/2020   Pure hypercholesterolemia 04/14/2020   Small bowel obstruction (HCC)    Partial small bowel obstruction (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   Acid reflux 10/20/2014   Hx of colonic polyps    Benign neoplasm of transverse colon    Diverticulosis of large intestine without diverticulitis    Allergic rhinitis 08/01/2014   Anxiety 08/01/2014   HLD (hyperlipidemia) 08/01/2014   Essential hypertension 08/01/2014   Vitamin D deficiency 08/01/2014   H/O adenomatous polyp of colon 01/22/2012    Conditions to be addressed/monitored: Depression; Mental Health Concerns   Care Plan : Depression (Adult)  Updates made by Vern Claude, LCSW since 01/18/2021 12:00 AM     Problem: Depression Identification (Depression)      Long-Range Goal: Depressive Symptoms Identified   Start Date: 02/16/2020  Expected End Date: 01/17/2021  This Visit's Progress: On track  Recent Progress: On track  Priority: High  Note:   Current Barriers:  Mental Health Concerns     Clinical Social Work Clinical Goal(s):  Over the next 90 days, patient will work with SW to address concerns related to identifying coping skills to manager her depression as well as connection her to a local mental health provider for ongoing mental health follow up   Interventions: 1:1 collaboration with [name], [name], MD regarding development and update of comprehensive plan of care as evidenced by provider attestation and co-signature Inter-disciplinary care team collaboration (see longitudinal plan of care) Continued to be provided with emotional support and positive reinforcement due to  care giving responsibilities and management of her own medical concerns Confirmed that patient's mood has improved, family relationships maintained and doing well, " I feel like I am on my way back" Positive coping strategies continue to be reinforced Self care [place] personal boundaries emphasized Verbalization of feelings  encouraged Reflective listening/ emotional support provided   Patient Goals/Self-Care Activities Over the next 90 days, patient will:   - Patient will attend all scheduled provider appointments Patient will attend church or other social activities - consider beginning personal counseling - talk about feelings with a friend, family or spiritual advisor - practice positive thinking and self-talk   Follow up Plan: SW will follow up with patient by phone over the next 14 business days        Follow Up Plan: SW will follow up with patient by phone over the next 30 days       [place], [place] Worker  [facility] [phone]

## 2021-01-18 NOTE — Patient Instructions (Signed)
Visit Information  Thank you for taking time to visit with me today. Please don't hesitate to contact me if I can be of assistance to you before our next scheduled telephone appointment.  Following are the goals we discussed today:  alk about feelings with a friend, family or spiritual advisor - practice positive thinking and self-talk  -continue to prioritize self care -follow up with medical provider when needed  Our next appointment is by telephone on 02/20/21 at 9am  Please call the care guide team at [phone] if you need to cancel or reschedule your appointment.   If you are experiencing a Mental Health or [place] or need someone to talk to, please call the Suicide and Crisis Lifeline: 988   Following is a copy of your full plan of care:  Care Plan : Depression (Adult)  Updates made by Wanda Claude, LCSW since 01/18/2021 12:00 AM     Problem: Depression Identification (Depression)      Long-Range Goal: Depressive Symptoms Identified   Start Date: 02/16/2020  Expected End Date: 01/17/2021  This Visit's Progress: On track  Recent Progress: On track  Priority: High  Note:   Current Barriers:  Mental Health Concerns     Clinical Social Work Clinical Goal(s):  Over the next 90 days, patient will work with SW to address concerns related to identifying coping skills to manager her depression as well as connection her to a local mental health provider for ongoing mental health follow up   Interventions: 1:1 collaboration with [name], Wanda Peri, MD regarding development and update of comprehensive plan of care as evidenced by provider attestation and co-signature Inter-disciplinary care team collaboration (see longitudinal plan of care) Continued to be provided with emotional support and positive reinforcement due to  care giving responsibilities and management of her own medical concerns Confirmed that patient's mood has improved, family relationships  maintained and doing well, " I feel like I am on my way back" Positive coping strategies continue to be reinforced Self care [place] personal boundaries emphasized Verbalization of feelings encouraged Reflective listening/ emotional support provided   Patient Goals/Self-Care Activities Over the next 90 days, patient will:   - Patient will attend all scheduled provider appointments Patient will attend church or other social activities - consider beginning personal counseling - talk about feelings with a friend, family or spiritual advisor - practice positive thinking and self-talk   Follow up Plan: SW will follow up with patient by phone over the next 14 business days       Ms. [name] was given information about Care Management services by the embedded care coordination team including:  Care Management services include personalized support from designated clinical staff supervised by her physician, including individualized plan of care and coordination with other care providers 24/7 contact phone numbers for assistance for urgent and routine care needs. The patient may stop CCM services at any time (effective at the end of the month) by phone call to the office staff.  Patient agreed to services and verbal consent obtained.   The patient verbalized understanding of instructions, educational materials, and care plan provided today and declined offer to receive copy of patient instructions, educational materials, and care plan.   Telephone follow up appointment with care management team member scheduled for: 02/21/20   [name], [place] Worker  [facility] [phone]

## 2021-02-17 DIAGNOSIS — F329 Major depressive disorder, single episode, unspecified: Secondary | ICD-10-CM

## 2021-02-20 ENCOUNTER — Ambulatory Visit (INDEPENDENT_AMBULATORY_CARE_PROVIDER_SITE_OTHER): Payer: Medicare Other | Admitting: *Deleted

## 2021-02-20 ENCOUNTER — Ambulatory Visit (INDEPENDENT_AMBULATORY_CARE_PROVIDER_SITE_OTHER): Payer: Medicare Other | Admitting: Family Medicine

## 2021-02-20 ENCOUNTER — Encounter: Payer: Self-pay | Admitting: Family Medicine

## 2021-02-20 DIAGNOSIS — N898 Other specified noninflammatory disorders of vagina: Secondary | ICD-10-CM | POA: Diagnosis not present

## 2021-02-20 MED ORDER — FLUCONAZOLE 150 MG PO TABS: 150.0000 mg | ORAL_TABLET | Freq: Once | ORAL | 0 refills | Status: DC

## 2021-02-20 MED ORDER — CLOTRIMAZOLE 1 % EX CREA: 1.0000 "application " | TOPICAL_CREAM | Freq: Two times a day (BID) | CUTANEOUS | 0 refills | Status: AC

## 2021-02-20 NOTE — Progress Notes (Signed)
Virtual Visit via Telephone Note  I connected with [name] on 02/20/21 at 11:40 AM EST by telephone and verified that I am speaking with the correct person using two identifiers.  Location: Patient: home Provider: [facility]   I discussed the limitations, risks, security and privacy concerns of performing an evaluation and management service by telephone and the availability of in person appointments. I also discussed with the patient that there may be a patient responsible charge related to this service. The patient expressed understanding and agreed to proceed.   History of Present Illness:  VAGINAL ITCHING - x2 weeks - no relief with OTC creams, yeastguard, lamisil.  Discharge description:  no different than usual   Dysuria: no Urinary frequency: no Fevers:  hasn't checked Abdominal pain: no  Recent antibiotic use:  about a month ago for tooth infection Context: previous yeast infections  Treatments attempted: OTC antifungal cream    Observations/Objective:  Patient had trouble connecting to video visit, entirety of visit conducted over the phone.  Speaks in full sentences, no respiratory distress.   Assessment and Plan:  Vaginal itching Likely candidasis given similar to prior yeast infections, prediabetes, and recent antibiotic use. Rx diflucan with external cream. If symptoms not improved after treatment, recommend in person appt for evaluation and vaginal swab.     I discussed the assessment and treatment plan with the patient. The patient was provided an opportunity to ask questions and all were answered. The patient agreed with the plan and demonstrated an understanding of the instructions.   The patient was advised to call back or seek an in-person evaluation if the symptoms worsen or if the condition fails to improve as anticipated.  I provided 7 minutes of non-face-to-face time during this encounter.   Myles Gip, DO

## 2021-02-20 NOTE — Chronic Care Management (AMB) (Addendum)
Chronic Care Management    Clinical Social Work Note  02/20/2021 Name: [name] MRN: [id number] DOB: 1945-04-16  [name] is a 76 y.o. year old female who is a primary care patient of [name], [name], MD. The CCM team was consulted to assist the patient with chronic disease management and/or care coordination needs related to: Mental Health Counseling and Resources.   Engaged with patient by telephone for follow up visit in response to provider referral for social work chronic care management and care coordination services.   Consent to Services:  The patient was given information about Chronic Care Management services, agreed to services, and gave verbal consent prior to initiation of services.  Please see initial visit note for detailed documentation.   Patient agreed to services and consent obtained.   Assessment: Review of patient past medical history, allergies, medications, and health status, including review of relevant consultants reports was performed today as part of a comprehensive evaluation and provision of chronic care management and care coordination services.     SDOH (Social Determinants of Health) assessments and interventions performed:    Advanced Directives Status: Not addressed in this encounter.  CCM Care Plan  Allergies  Allergen Reactions   Levofloxacin     Tongue and mouth swelling   Calcium Channel Blockers     Other reaction(s): Dizziness   Amoxicillin Other (See Comments)    Causes yeast infection Has patient had a PCN reaction causing immediate rash, facial/tongue/throat swelling, SOB or lightheadedness with hypotension: No Has patient had a PCN reaction causing severe rash involving mucus membranes or skin necrosis: No Has patient had a PCN reaction that required hospitalization: No Has patient had a PCN reaction occurring within the last 10 years: No If all of the above answers are "NO", then may proceed with Cephalosporin use.     Nickel Rash   Penicillins Other (See Comments)    Causes yeast infection Has patient had a PCN reaction causing immediate rash, facial/tongue/throat swelling, SOB or lightheadedness with hypotension: No Has patient had a PCN reaction causing severe rash involving mucus membranes or skin necrosis: No Has patient had a PCN reaction that required hospitalization: No Has patient had a PCN reaction occurring within the last 10 years: No If all of the above answers are "NO", then may proceed with Cephalosporin use.    Outpatient Encounter Medications as of 02/20/2021  Medication Sig   albuterol (VENTOLIN HFA) 108 (90 Base) MCG/ACT inhaler TAKE 2 PUFFS BY MOUTH EVERY 6 HOURS AS NEEDED FOR WHEEZE OR SHORTNESS OF BREATH   ALPRAZolam (XANAX) 0.5 MG tablet Take 1 tablet (0.5 mg total) by mouth 3 (three) times daily as needed for anxiety or sleep.   aspirin EC 81 MG tablet Take 81 mg by mouth daily.   cephALEXin (KEFLEX) 500 MG capsule Take 1 capsule (500 mg total) by mouth 2 (two) times daily.   citalopram (CELEXA) 20 MG tablet Take 1 tablet (20 mg total) by mouth daily.   fluticasone (FLONASE) 50 MCG/ACT nasal spray PLACE 1-2 SPRAYS INTO BOTH NOSTRILS DAILY AS NEEDED FOR ALLERGIES OR RHINITIS.   ibuprofen (ADVIL) 800 MG tablet TAKE 1 TABLET (800 MG TOTAL) BY MOUTH DAILY AS NEEDED (SEVERE PAIN).   omeprazole (PRILOSEC) 20 MG capsule Take 20 mg by mouth daily as needed (acid reflux).    rosuvastatin (CRESTOR) 40 MG tablet Take 1 tablet (40 mg total) by mouth daily.   No facility-administered encounter medications on file as of 02/20/2021.  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   Acid reflux 10/20/2014   Hx of colonic polyps    Benign neoplasm of transverse colon    Diverticulosis of large intestine without diverticulitis    Allergic rhinitis 08/01/2014   Anxiety 08/01/2014   HLD (hyperlipidemia) 08/01/2014   Essential hypertension 08/01/2014   Vitamin D deficiency 08/01/2014   H/O adenomatous polyp of colon 01/22/2012    Conditions to be addressed/monitored: Depression; Mental Health Concerns   Care Plan : Depression (Adult)  Updates made by Vern Claude, LCSW since 02/20/2021 12:00 AM     Problem: Depression Identification (Depression)      Long-Range Goal: Depressive Symptoms Identified   Start Date: 02/16/2020  Expected End Date: 01/17/2021  Recent Progress: On track  Priority: High  Note:   Current Barriers:  Mental Health Concerns  Depression  Clinical Social Work Clinical Goal(s):  Over the next 90 days, patient will work with SW to address concerns related to identifying coping skills to manager her depression as well as connection her to a local mental health provider for ongoing mental health follow up   Interventions: 1:1 collaboration with [name], [name], MD regarding development and update of comprehensive plan of care as evidenced by provider attestation and co-signature Inter-disciplinary care team collaboration (see longitudinal plan of care) Continued to be provided with emotional support related to mental health concerns Confirmed that depressive symptoms have re-surfaced(lack of motivation to complete ADL's) Discussed concern regarding rash on legs, patient agreeable to contacting her providers office Positive coping strategies discussed including open communication and medication adherance Self care [place] personal boundaries emphasized Verbalization of feelings encouraged Engaged  patient in Motivation Interviewing Reflective listening/ emotional support provided   Patient Goals/Self-Care Activities Over the next 90 days, patient will:   - Patient will attend all scheduled provider appointments Patient will attend church or other social activities - consider beginning personal counseling - talk about feelings with a friend, family or spiritual advisor - practice positive thinking and self-talk   Follow up Plan: SW will follow up with patient by phone over the next 14 business days        Follow Up Plan: SW will follow up with patient by phone over the next 14 business days       [employer], [place] Worker  [place] [facility] [phone]

## 2021-02-20 NOTE — Patient Instructions (Signed)
Visit Information  Thank you for taking time to visit with me today. Please don't hesitate to contact me if I can be of assistance to you before our next scheduled telephone appointment.  Following are the goals we discussed today:   - talk about feelings with a friend, family or spiritual advisor - practice positive thinking and self-talk  -continue to prioritize self care -follow up with medical provider when needed  Our next appointment is by telephone on 03/06/21 at 10am  Please call the care guide team at [phone] if you need to cancel or reschedule your appointment.   If you are experiencing a Mental Health or [place] or need someone to talk to, please call the Suicide and Crisis Lifeline: 988   The patient verbalized understanding of instructions, educational materials, and care plan provided today and declined offer to receive copy of patient instructions, educational materials, and care plan.   Telephone follow up appointment with care management team member scheduled for: 03/06/21 10am   Caison Hearn, [place] Worker  [facility] [phone]

## 2021-03-06 ENCOUNTER — Ambulatory Visit: Payer: Medicare Other | Admitting: *Deleted

## 2021-03-06 DIAGNOSIS — F419 Anxiety disorder, unspecified: Secondary | ICD-10-CM

## 2021-03-06 DIAGNOSIS — F329 Major depressive disorder, single episode, unspecified: Secondary | ICD-10-CM

## 2021-03-06 NOTE — Chronic Care Management (AMB) (Signed)
Chronic Care Management    Clinical Social Work Note  03/06/2021 Name: [name] MRN: [id number] DOB: 09-Sep-1945  [name] is a 76 y.o. year old female who is a primary care patient of [name], [name], MD. The CCM team was consulted to assist the patient with chronic disease management and/or care coordination needs related to: Mental Health Counseling and Resources.   Engaged with patient by telephone for follow up visit in response to provider referral for social work chronic care management and care coordination services.   Consent to Services:  The patient was given information about Chronic Care Management services, agreed to services, and gave verbal consent prior to initiation of services.  Please see initial visit note for detailed documentation.   Patient agreed to services and consent obtained.   Assessment: Review of patient past medical history, allergies, medications, and health status, including review of relevant consultants reports was performed today as part of a comprehensive evaluation and provision of chronic care management and care coordination services.     SDOH (Social Determinants of Health) assessments and interventions performed:    Advanced Directives Status: Not addressed in this encounter.  CCM Care Plan  Allergies  Allergen Reactions   Levofloxacin     Tongue and mouth swelling   Calcium Channel Blockers     Other reaction(s): Dizziness   Amoxicillin Other (See Comments)    Causes yeast infection Has patient had a PCN reaction causing immediate rash, facial/tongue/throat swelling, SOB or lightheadedness with hypotension: No Has patient had a PCN reaction causing severe rash involving mucus membranes or skin necrosis: No Has patient had a PCN reaction that required hospitalization: No Has patient had a PCN reaction occurring within the last 10 years: No If all of the above answers are "NO", then may proceed with Cephalosporin use.     Nickel Rash   Penicillins Other (See Comments)    Causes yeast infection Has patient had a PCN reaction causing immediate rash, facial/tongue/throat swelling, SOB or lightheadedness with hypotension: No Has patient had a PCN reaction causing severe rash involving mucus membranes or skin necrosis: No Has patient had a PCN reaction that required hospitalization: No Has patient had a PCN reaction occurring within the last 10 years: No If all of the above answers are "NO", then may proceed with Cephalosporin use.    Outpatient Encounter Medications as of 03/06/2021  Medication Sig   albuterol (VENTOLIN HFA) 108 (90 Base) MCG/ACT inhaler TAKE 2 PUFFS BY MOUTH EVERY 6 HOURS AS NEEDED FOR WHEEZE OR SHORTNESS OF BREATH   ALPRAZolam (XANAX) 0.5 MG tablet Take 1 tablet (0.5 mg total) by mouth 3 (three) times daily as needed for anxiety or sleep.   aspirin EC 81 MG tablet Take 81 mg by mouth daily.   citalopram (CELEXA) 20 MG tablet Take 1 tablet (20 mg total) by mouth daily.   clotrimazole (LOTRIMIN) 1 % cream Apply 1 application topically 2 (two) times daily.   fluticasone (FLONASE) 50 MCG/ACT nasal spray PLACE 1-2 SPRAYS INTO BOTH NOSTRILS DAILY AS NEEDED FOR ALLERGIES OR RHINITIS.   ibuprofen (ADVIL) 800 MG tablet TAKE 1 TABLET (800 MG TOTAL) BY MOUTH DAILY AS NEEDED (SEVERE PAIN).   omeprazole (PRILOSEC) 20 MG capsule Take 20 mg by mouth daily as needed (acid reflux).    rosuvastatin (CRESTOR) 40 MG tablet Take 1 tablet (40 mg total) by mouth daily.   No facility-administered encounter medications on file as of 03/06/2021.    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   Acid reflux 10/20/2014   Hx of colonic polyps    Benign neoplasm of transverse colon    Diverticulosis of large intestine without diverticulitis    Allergic rhinitis 08/01/2014   Anxiety 08/01/2014   HLD (hyperlipidemia) 08/01/2014   Essential hypertension 08/01/2014   Vitamin D deficiency 08/01/2014   H/O adenomatous polyp of colon 01/22/2012    Conditions to be addressed/monitored: Depression; Mental Health Concerns   Care Plan : Depression (Adult)  Updates made by Vern Claude, LCSW since 03/06/2021 12:00 AM     Problem: Depression Identification (Depression)      Long-Range Goal: Depressive Symptoms Identified   Start Date: 02/16/2020  Expected End Date: 01/17/2021  Recent Progress: On track  Priority: High  Note:   Current Barriers:  Mental Health Concerns  Depression  Clinical Social Work Clinical Goal(s):  Over the next 90 days, patient will work with SW to address concerns related to identifying coping skills to manager her depression as well as connection her to a local mental health provider for ongoing mental health follow up   Interventions: 1:1 collaboration with [name], [name], MD regarding development and update of comprehensive plan of care as evidenced by provider attestation and co-signature Inter-disciplinary care team collaboration (see longitudinal plan of care) Continued to be provided with emotional support related to mental health concerns Confirmed that depressive symptoms continue, specifically related to her personal relationships Patient denies thoughts of harm to herself or others, however does confirm that the actions of others often affects her mood Patient provided with validation and encouragement Positive coping strategies discussed including open communication and medication adherance Self care [place]  personal boundaries emphasized Verbalization of feelings encouraged Engaged patient in Motivation Interviewing Reflective listening/ emotional support provided   Patient Goals/Self-Care Activities Over the next 90 days, patient will:   - Patient will attend all scheduled provider appointments Patient will attend church or other social activities - consider beginning personal counseling - talk about feelings with a friend, family or spiritual advisor - practice positive thinking and self-talk   Follow up Plan: SW will follow up with patient by phone over the next 14 business days        Follow Up Plan: SW will follow up with patient by phone over the next 30 days       [place], [place] Worker  [facility] [phone]

## 2021-03-06 NOTE — Patient Instructions (Signed)
Visit Information  Thank you for taking time to visit with me today. Please don't hesitate to contact me if I can be of assistance to you before our next scheduled telephone appointment.  Following are the goals we discussed today:   - talk about feelings with a friend, family or spiritual advisor - practice positive thinking and self-talk  -continue to prioritize self care -follow up with medical provider when needed  Our next appointment is by telephone on 03/27/21 at 10  Please call the care guide team at [phone] if you need to cancel or reschedule your appointment.   If you are experiencing a Mental Health or [place] or need someone to talk to, please call the Suicide and Crisis Lifeline: 988   The patient verbalized understanding of instructions, educational materials, and care plan provided today and declined offer to receive copy of patient instructions, educational materials, and care plan.   Telephone follow up appointment with care management team member scheduled for: 03/27/21   Elliot Gurney, [place] Worker  [facility] [phone]

## 2021-03-07 ENCOUNTER — Encounter: Payer: Medicare Other | Admitting: Family Medicine

## 2021-03-07 NOTE — Progress Notes (Deleted)
Annual Wellness Visit     Patient: [name], Female    DOB: 30-Jun-1945, 76 y.o.   MRN: [id number] Visit Date: 03/07/2021  Today's Provider: Lelon Huh, MD   No chief complaint on file.  Subjective    [name] is a 76 y.o. female who presents today for her Annual Wellness Visit. She reports consuming a {diet types:17450} diet. {Exercise:19826} She generally feels {well/fairly well/poorly:18703}. She reports sleeping {well/fairly well/poorly:18703}. She {does/does not:200015} have additional problems to discuss today.   HPI    Medications: Outpatient Medications Prior to Visit  Medication Sig   albuterol (VENTOLIN HFA) 108 (90 Base) MCG/ACT inhaler TAKE 2 PUFFS BY MOUTH EVERY 6 HOURS AS NEEDED FOR WHEEZE OR SHORTNESS OF BREATH   ALPRAZolam (XANAX) 0.5 MG tablet Take 1 tablet (0.5 mg total) by mouth 3 (three) times daily as needed for anxiety or sleep.   aspirin EC 81 MG tablet Take 81 mg by mouth daily.   citalopram (CELEXA) 20 MG tablet Take 1 tablet (20 mg total) by mouth daily.   clotrimazole (LOTRIMIN) 1 % cream Apply 1 application topically 2 (two) times daily.   fluticasone (FLONASE) 50 MCG/ACT nasal spray PLACE 1-2 SPRAYS INTO BOTH NOSTRILS DAILY AS NEEDED FOR ALLERGIES OR RHINITIS.   ibuprofen (ADVIL) 800 MG tablet TAKE 1 TABLET (800 MG TOTAL) BY MOUTH DAILY AS NEEDED (SEVERE PAIN).   omeprazole (PRILOSEC) 20 MG capsule Take 20 mg by mouth daily as needed (acid reflux).    rosuvastatin (CRESTOR) 40 MG tablet Take 1 tablet (40 mg total) by mouth daily.   No facility-administered medications prior to visit.    Allergies  Allergen Reactions   Levofloxacin     Tongue and mouth swelling   Calcium Channel Blockers     Other reaction(s): Dizziness   Amoxicillin Other (See Comments)    Causes yeast infection Has patient had a PCN reaction causing immediate rash, facial/tongue/throat swelling, SOB or lightheadedness with hypotension: No Has patient had a  PCN reaction causing severe rash involving mucus membranes or skin necrosis: No Has patient had a PCN reaction that required hospitalization: No Has patient had a PCN reaction occurring within the last 10 years: No If all of the above answers are "NO", then may proceed with Cephalosporin use.    Nickel Rash   Penicillins Other (See Comments)    Causes yeast infection Has patient had a PCN reaction causing immediate rash, facial/tongue/throat swelling, SOB or lightheadedness with hypotension: No Has patient had a PCN reaction causing severe rash involving mucus membranes or skin necrosis: No Has patient had a PCN reaction that required hospitalization: No Has patient had a PCN reaction occurring within the last 10 years: No If all of the above answers are "NO", then may proceed with Cephalosporin use.    Patient Care Team: Birdie Sons, MD as PCP - General (Family Medicine) End, Harrell Gave, MD as PCP - Cardiology (Cardiology) Lucilla Lame, MD as Consulting Physician (Gastroenterology) Bary Castilla Forest Gleason, MD as Consulting Physician (General Surgery) Vern Claude, [place] as Social Worker [place], [place] Breech, [place] (Optometry)  Review of Systems  {Labs   Heme   Chem   Endocrine   Serology   Results Review (optional):23779}    Objective    Vitals: There were no vitals taken for this visit. {Show previous vital signs (optional):23777}  Physical Exam ***  Most recent functional status assessment: No flowsheet data found. Most recent fall risk assessment: Fall Risk  09/30/2019  Falls in the past year? 0  Number falls in past yr: 0  Injury with Fall? 0  Follow up -    Most recent depression screenings: PHQ 2/9 Scores 01/25/2020 09/30/2019  PHQ - 2 Score 6 0  PHQ- 9 Score 17 -   Most recent cognitive screening: 6CIT Screen 09/25/2016  What Year? 0 points  What month? 0 points  What time? 0 points  Count back from 20 0 points  Months in reverse 0 points  Repeat phrase 2 points   Total Score 2   Most recent Audit-C alcohol use screening Alcohol Use Disorder Test (AUDIT) 09/30/2019  1. How often do you have a drink containing alcohol? 0  2. How many drinks containing alcohol do you have on a typical day when you are drinking? 0  3. How often do you have six or more drinks on one occasion? 0  AUDIT-C Score 0  Alcohol Brief Interventions/Follow-up AUDIT Score <7 follow-up not indicated   A score of 3 or more in women, and 4 or more in men indicates increased risk for alcohol abuse, EXCEPT if all of the points are from question 1   No results found for any visits on 03/07/21.  Assessment & Plan     Annual wellness visit done today including the all of the following: Reviewed patient's Family Medical History Reviewed and updated list of patient's medical providers Assessment of cognitive impairment was done Assessed patient's functional ability Established a written schedule for health screening [place] Completed and Reviewed  Exercise Activities and Dietary recommendations  Goals       "I would like a living trust" (pt-stated)      [place] (see longitudinal plan of care for additional care plan information)  Current Barriers:  Lacks knowledge of community resource: regarding [place] Work Clinical Goal(s):  Over the next 90 days, patient will work with a Copywriter, advertising to address needs related to [name]  Interventions: Inter-disciplinary care team collaboration (see longitudinal plan of care) Patient discussed continued need to get her affairs in order and would like resources to complete her estate planning Positive reinforcement provided for motivation to have end of life plans Patient confirmed that she has been in contact with Elder Law Attorney's and is currently working on arranging her finances to pay for the needed estate planning   Patient Self Care Activities:  Performs ADL's  independently Performs IADL's independently Knowledge deficit regarding legal resources to complete [name]  Initial goal documentation       "I would like to manage my depression better" (pt-stated)      Current Barriers:  Mental Health Concerns   Clinical Social Work Clinical Goal(s):  Over the next 90 days, client will work with SW to address concerns related to depression Interventions: Followed up with patient in regards to her management of stress and depression Patient continues to discuss improvement in mood now, discussing some set backs due to the holidays Continued to discuss leaning on positive network of support from her family Discussed relationship concerns, boundary setting and positive coping Continued to provided patient with positive reinforcement and emotional support  Discussed plans with patient for ongoing care management follow up and provided patient with direct contact information for care management team  Patient Self Care Activities:  Performs ADL's independently Performs IADL's independently Calls provider office for new concerns or questions  Please see past updates related to  this goal by clicking on the "Past Updates" button in the selected goal        DIET - REDUCE PORTION SIZE      Recommend decreasing portion sizes in half and eating 3 small meals a day with 2 healthy snacks in between.       Eat more fruits and vegetables      Recommend increasing consumption of fruits and vegetables daily (2 servings).      Manage My Emotions      Timeframe:  Long-Range Goal Priority:  High Start Date:  02/16/20                           Expected End Date: 08/16/20                     Follow Up Date 03/27/21   - talk about feelings with a friend, family or spiritual advisor - practice positive thinking and self-talk  -continue to prioritize self care -follow up with medical provider when needed    Why is this important?   When you are stressed,  down or upset, your body reacts too.  For example, your blood pressure may get higher; you may have a headache or stomachache.  When your emotions get the best of you, your body's ability to fight off cold and flu gets weak.  These steps will help you manage your emotions.     Notes:         Immunization History  Administered Date(s) Administered   Pneumococcal Conjugate-13 01/19/2014    Health Maintenance  Topic Date Due   COVID-19 Vaccine (1) Never done   Zoster Vaccines- Shingrix (1 of 2) Never done   MAMMOGRAM  Never done   Pneumonia Vaccine 79+ Years old (2 - PPSV23 if available, else PCV20) 01/20/2015   INFLUENZA VACCINE  Never done   TETANUS/TDAP  02/18/2026 (Originally 11/16/1966)   COLONOSCOPY (Pts 45-45yrs Insurance coverage will need to be confirmed)  10/27/2022   DEXA SCAN  Completed   Hepatitis C Screening  Completed   HPV VACCINES  Aged Out     Discussed health benefits of physical activity, and encouraged her to engage in regular exercise appropriate for her age and condition.    ***  No follow-ups on file.     {provider attestation***:1}   Lelon Huh, MD  [facility] [phone] (phone) [phone] (fax)  [place]

## 2021-03-19 IMAGING — CR DG ABDOMEN 2V
3 series · 3 of 3 positions shown · non-contrast
Comparison: December 20, 2018.

CLINICAL DATA: Generalized abdominal pain.

EXAM:
ABDOMEN - 2 VIEW

[abdomen erect]
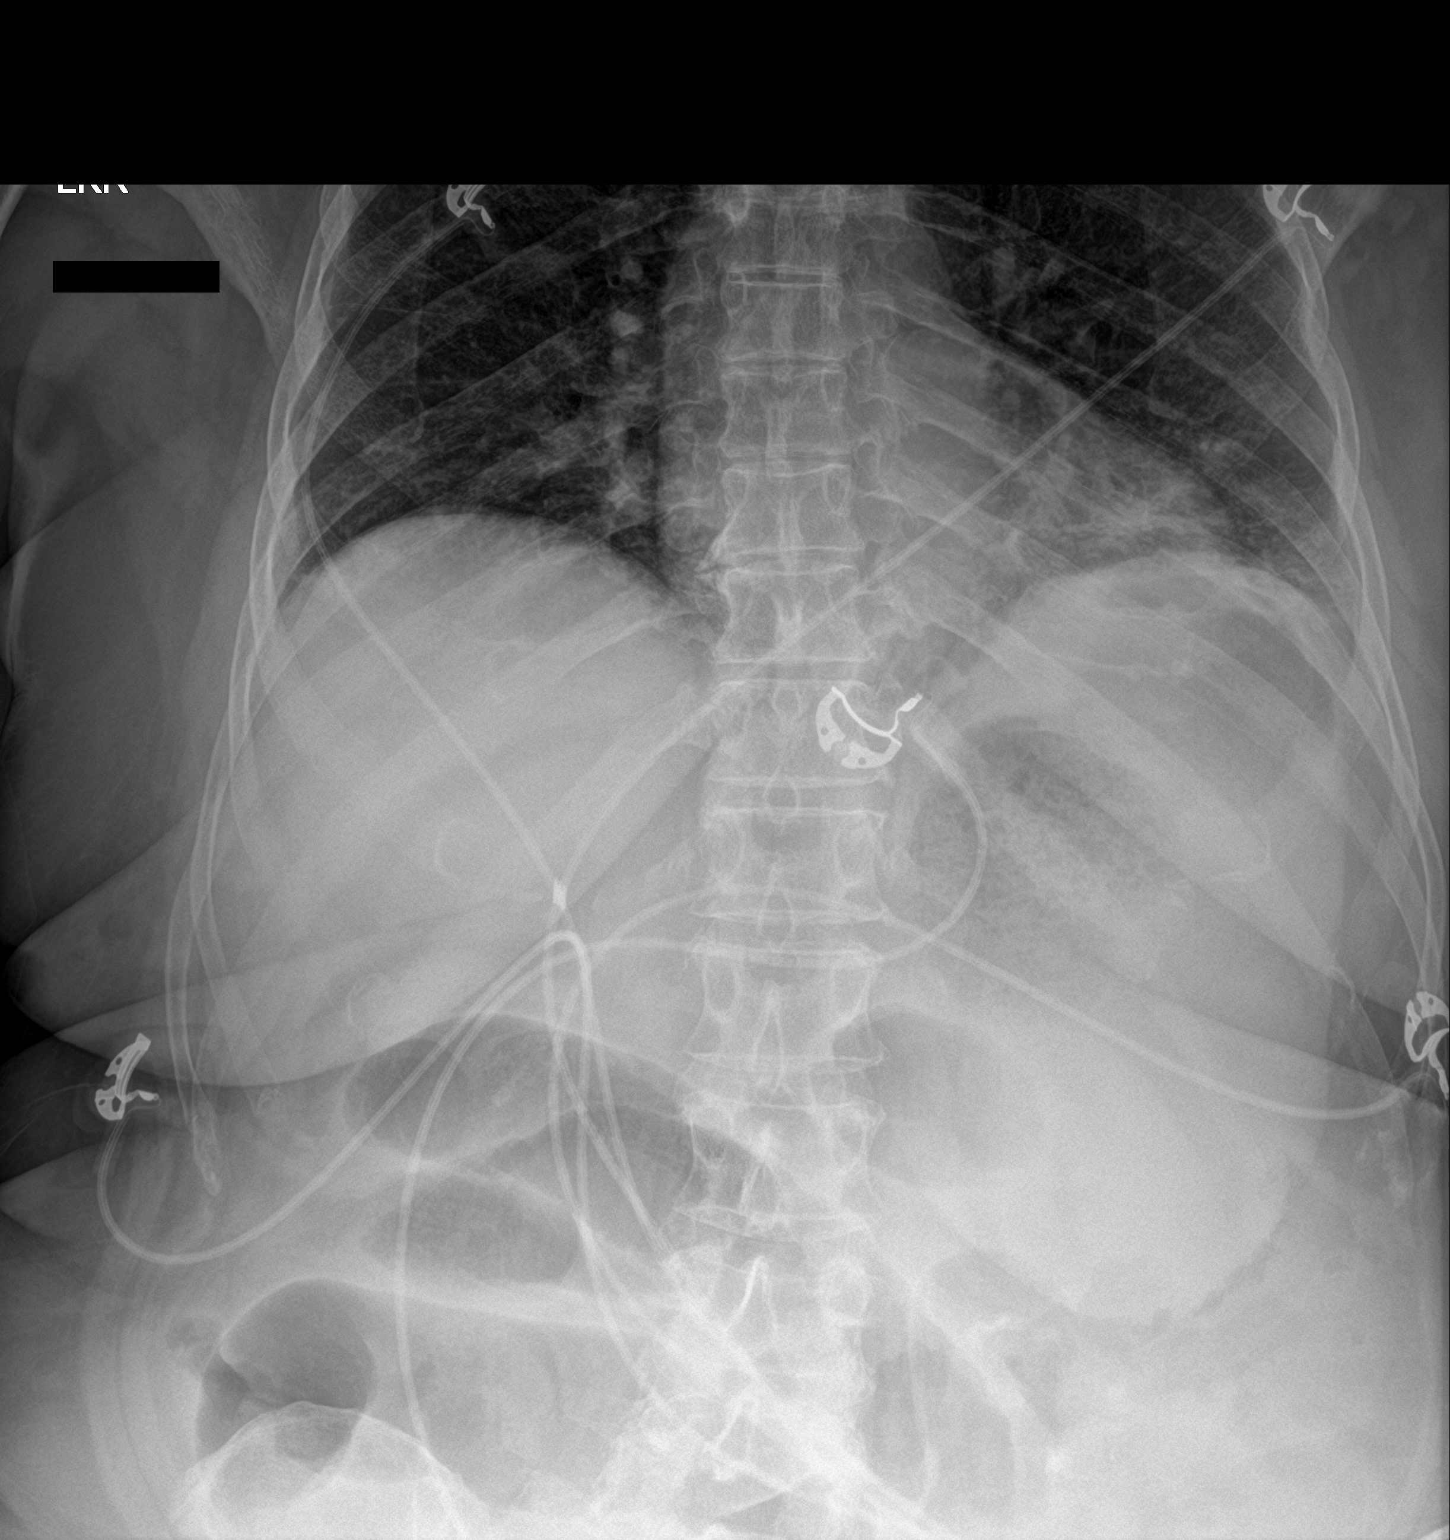

[abdomen supine (1 of 2)]
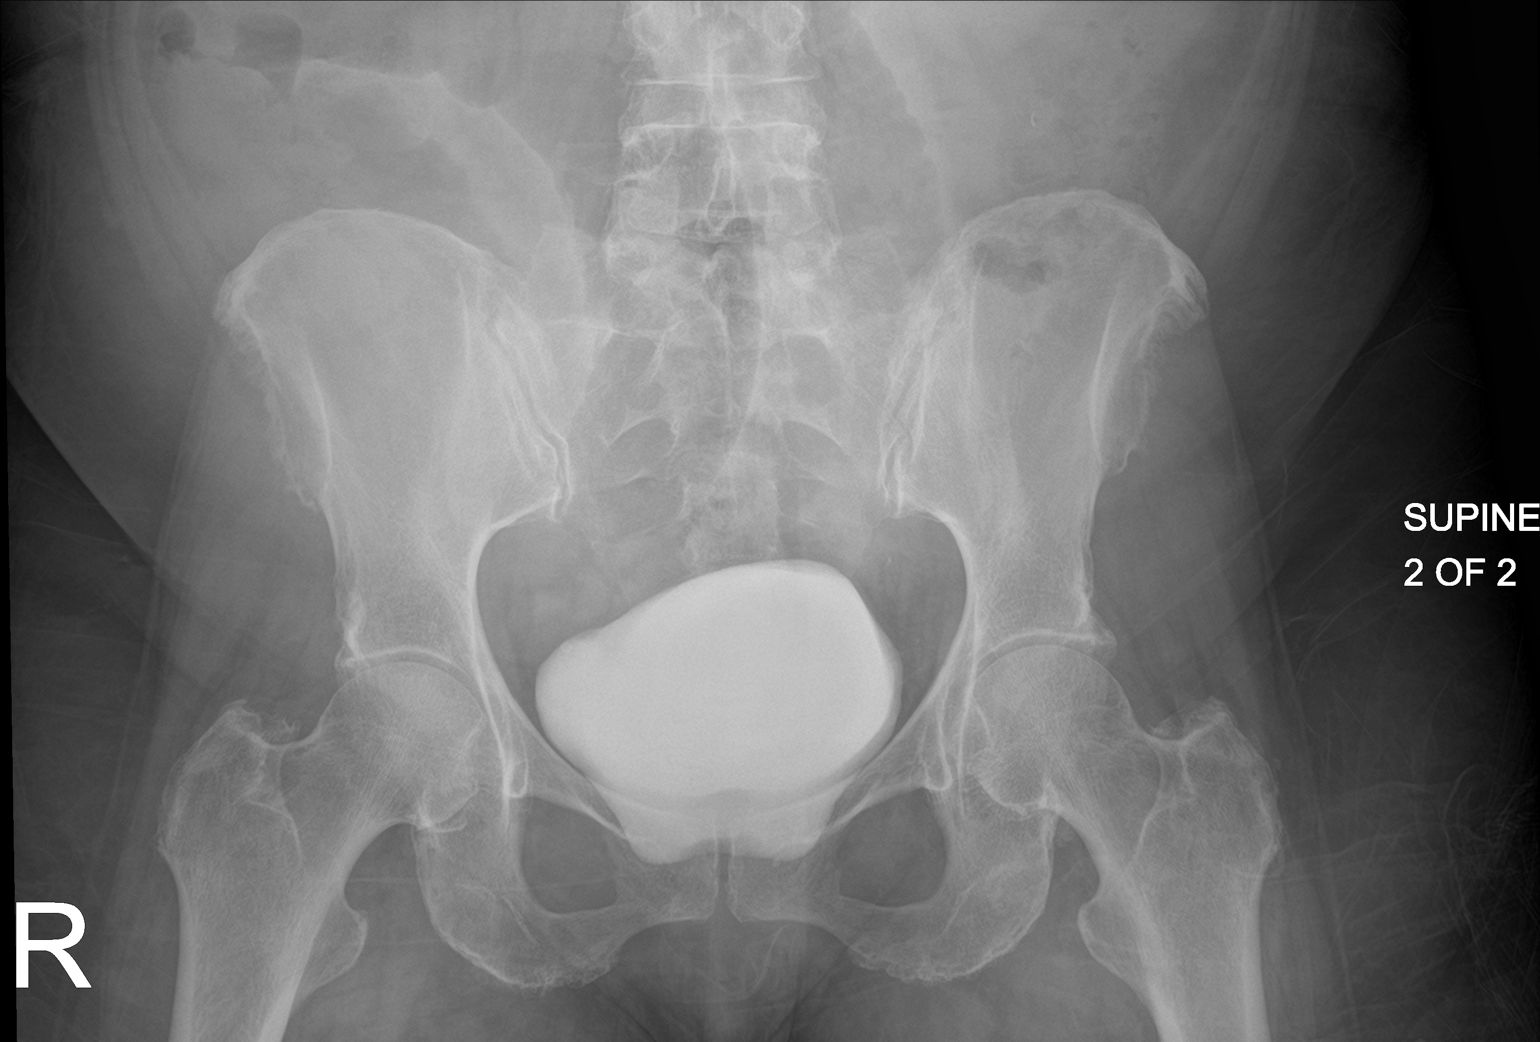

[abdomen supine (2 of 2)]
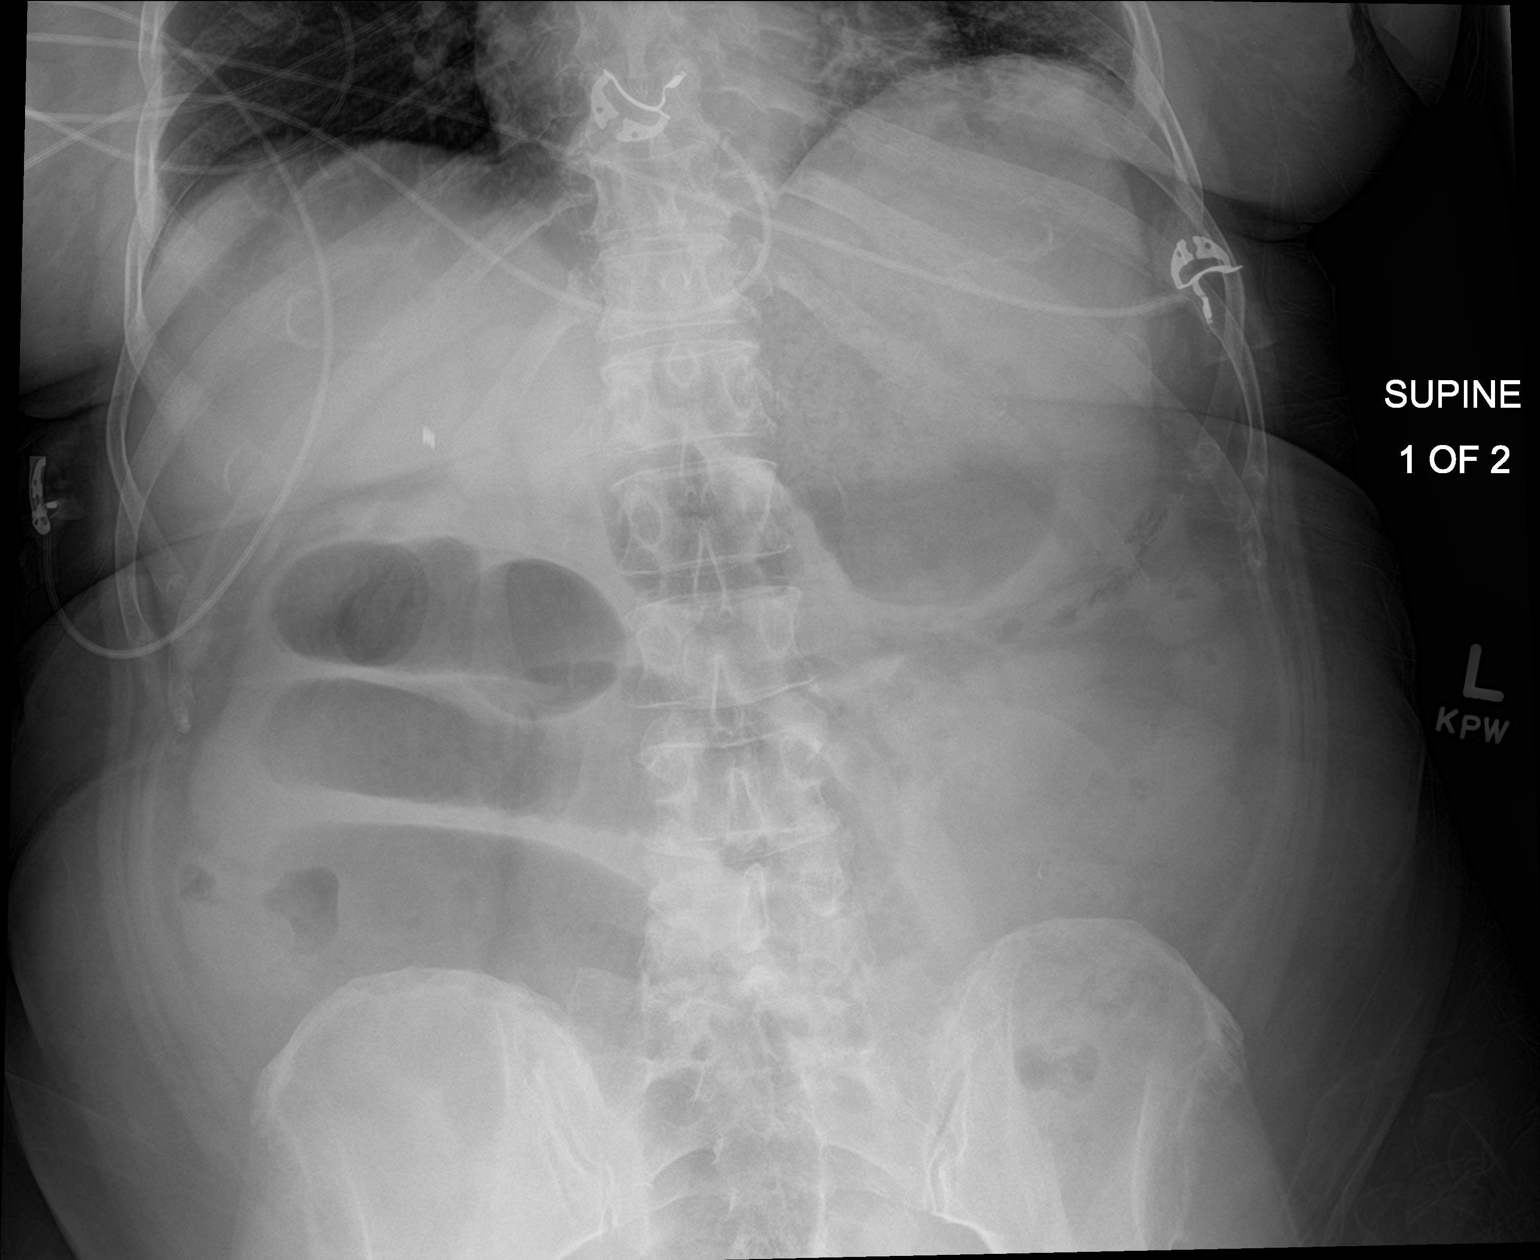

[3 of 3 positions shown; findings below may reference images not displayed]

FINDINGS: Stable moderately dilated small bowel loops are seen in the right
side of the abdomen concerning for distal small bowel obstruction.
There is no evidence of free air. No radio-opaque calculi or other
significant radiographic abnormality is seen.
IMPRESSION: Stable moderately dilated small bowel loops are noted in right side
of the abdomen concerning for distal small bowel obstruction.

## 2021-03-20 DIAGNOSIS — F329 Major depressive disorder, single episode, unspecified: Secondary | ICD-10-CM

## 2021-03-27 ENCOUNTER — Ambulatory Visit (INDEPENDENT_AMBULATORY_CARE_PROVIDER_SITE_OTHER): Payer: Medicare Other | Admitting: *Deleted

## 2021-03-27 ENCOUNTER — Telehealth: Payer: Self-pay | Admitting: *Deleted

## 2021-03-27 DIAGNOSIS — F419 Anxiety disorder, unspecified: Secondary | ICD-10-CM

## 2021-03-27 DIAGNOSIS — F329 Major depressive disorder, single episode, unspecified: Secondary | ICD-10-CM

## 2021-03-27 NOTE — Chronic Care Management (AMB) (Signed)
Chronic Care Management    Clinical Social Work Note  03/27/2021 Name: [name] MRN: [id number] DOB: [birth year]  [name] is a 76 y.o. year old female who is a primary care patient of [name], Wanda Peri, MD. The CCM team was consulted to assist the patient with chronic disease management and/or care coordination needs related to: Mental Health Counseling and Resources.   Engaged with patient by telephone for follow up visit in response to provider referral for social work chronic care management and care coordination services.   Consent to Services:  The patient was given information about Chronic Care Management services, agreed to services, and gave verbal consent prior to initiation of services.  Please see initial visit note for detailed documentation.   Patient agreed to services and consent obtained.   Assessment: Review of patient past medical history, allergies, medications, and health status, including review of relevant consultants reports was performed today as part of a comprehensive evaluation and provision of chronic care management and care coordination services.     SDOH (Social Determinants of Health) assessments and interventions performed:    Advanced Directives Status: Not addressed in this encounter.  CCM Care Plan  Allergies  Allergen Reactions   Levofloxacin     Tongue and mouth swelling   Calcium Channel Blockers     Other reaction(s): Dizziness   Amoxicillin Other (See Comments)    Causes yeast infection Has patient had a PCN reaction causing immediate rash, facial/tongue/throat swelling, SOB or lightheadedness with hypotension: No Has patient had a PCN reaction causing severe rash involving mucus membranes or skin necrosis: No Has patient had a PCN reaction that required hospitalization: No Has patient had a PCN reaction occurring within the last 10 years: No If all of the above answers are "NO", then may proceed with Cephalosporin use.     Nickel Rash   Penicillins Other (See Comments)    Causes yeast infection Has patient had a PCN reaction causing immediate rash, facial/tongue/throat swelling, SOB or lightheadedness with hypotension: No Has patient had a PCN reaction causing severe rash involving mucus membranes or skin necrosis: No Has patient had a PCN reaction that required hospitalization: No Has patient had a PCN reaction occurring within the last 10 years: No If all of the above answers are "NO", then may proceed with Cephalosporin use.    Outpatient Encounter Medications as of 03/27/2021  Medication Sig   albuterol (VENTOLIN HFA) 108 (90 Base) MCG/ACT inhaler TAKE 2 PUFFS BY MOUTH EVERY 6 HOURS AS NEEDED FOR WHEEZE OR SHORTNESS OF BREATH   ALPRAZolam (XANAX) 0.5 MG tablet Take 1 tablet (0.5 mg total) by mouth 3 (three) times daily as needed for anxiety or sleep.   aspirin EC 81 MG tablet Take 81 mg by mouth daily.   citalopram (CELEXA) 20 MG tablet Take 1 tablet (20 mg total) by mouth daily.   clotrimazole (LOTRIMIN) 1 % cream Apply 1 application topically 2 (two) times daily.   fluticasone (FLONASE) 50 MCG/ACT nasal spray PLACE 1-2 SPRAYS INTO BOTH NOSTRILS DAILY AS NEEDED FOR ALLERGIES OR RHINITIS.   ibuprofen (ADVIL) 800 MG tablet TAKE 1 TABLET (800 MG TOTAL) BY MOUTH DAILY AS NEEDED (SEVERE PAIN).   omeprazole (PRILOSEC) 20 MG capsule Take 20 mg by mouth daily as needed (acid reflux).    rosuvastatin (CRESTOR) 40 MG tablet Take 1 tablet (40 mg total) by mouth daily.   No facility-administered encounter medications on file as of 03/27/2021.    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   Acid reflux 10/20/2014   Hx of colonic polyps    Benign neoplasm of transverse colon    Diverticulosis of large intestine without diverticulitis    Allergic rhinitis 08/01/2014   Anxiety 08/01/2014   HLD (hyperlipidemia) 08/01/2014   Essential hypertension 08/01/2014   Vitamin D deficiency 08/01/2014   H/O adenomatous polyp of colon 01/22/2012    Conditions to be addressed/monitored: Depression; Mental Health Concerns   Care Plan : Depression (Adult)  Updates made by Vern Claude, LCSW since 03/27/2021 12:00 AM     Problem: Depression Identification (Depression)      Long-Range Goal: Depressive Symptoms Identified   Start Date: 02/16/2020  Expected End Date: 01/17/2021  This Visit's Progress: On track  Recent Progress: On track  Priority: High  Note:   Current Barriers:  Mental Health Concerns  Depression  Clinical Social Work Clinical Goal(s):  Over the next 90 days, patient will work with SW to address concerns related to identifying coping skills to manager her depression as well as connection her to a local mental health provider for ongoing mental health follow up   Interventions: 1:1 collaboration with [name], Wanda Peri, MD regarding development and update of comprehensive plan of care as evidenced by provider attestation and co-signature Inter-disciplinary care team collaboration (see longitudinal plan of care) Continued to be provided with emotional support related to mental health concerns Confirmed that depressive symptoms continue, specifically related to her personal relationships Patient's feeling regarding recent losses processed emphasizing maintaining healthy boundaries to reinforce self care Patient provided with validation and encouragement Positive coping strategies discussed including open communication and medication  adherance Verbalization of feelings encouraged Engaged patient in Motivation Interviewing Reflective listening/ emotional support provided   Patient Goals/Self-Care Activities Over the next 90 days, patient will:   - Patient will attend all scheduled provider appointments Patient will attend church or other social activities - consider beginning personal counseling - talk about feelings with a friend, family or spiritual advisor - practice positive thinking and self-talk   Follow up Plan: SW will follow up with patient by phone over the next 14 business days        Follow Up Plan: SW will follow up with patient by phone over the next 14 business days       [employer], [place] Worker  [facility] [phone]

## 2021-03-27 NOTE — Patient Instructions (Signed)
Visit Information  Thank you for taking time to visit with me today. Please don't hesitate to contact me if I can be of assistance to you before our next scheduled telephone appointment.  Following are the goals we discussed today:   - talk about feelings with a friend, family or spiritual advisor - practice positive thinking and self-talk  -continue to prioritize self care and the establishing of boundaries -follow up with medical provider when needed  Our next appointment is by telephone on 04/10/21 at 10am  Please call the care guide team at [phone] if you need to cancel or reschedule your appointment.   If you are experiencing a Mental Health or [place] or need someone to talk to, please call the Suicide and Crisis Lifeline: 988   Patient verbalizes understanding of instructions and care plan provided today and agrees to view in [place]. Active MyChart status confirmed with patient.    Telephone follow up appointment with care management team member scheduled for: 04/10/21   Elliot Gurney, [place] Worker  [facility] [phone]

## 2021-03-27 NOTE — Telephone Encounter (Signed)
°  Care Management   Follow Up Note   03/27/2021 Name: RAPHAEL ESPE MRN: [id number] DOB: [birth year]   Referred by: Birdie Sons, MD Reason for referral : Care Coordination   An unsuccessful telephone outreach was attempted today. The patient was referred to the case management team for assistance with care management and care coordination.   Follow Up Plan: The care management team will reach out to the patient again over the next 7 days.    Elliot Gurney, [place] Worker  [facility] [phone]

## 2021-04-09 ENCOUNTER — Other Ambulatory Visit: Payer: Self-pay | Admitting: Family Medicine

## 2021-04-09 DIAGNOSIS — F419 Anxiety disorder, unspecified: Secondary | ICD-10-CM

## 2021-04-10 ENCOUNTER — Telehealth: Payer: Medicare Other | Admitting: *Deleted

## 2021-04-10 ENCOUNTER — Telehealth: Payer: Self-pay | Admitting: *Deleted

## 2021-04-10 NOTE — Telephone Encounter (Signed)
°  Care Management   Follow Up Note   04/10/2021 Name: TRESEA HEINE MRN: [id number] DOB: [birth year]   Referred by: Birdie Sons, MD Reason for referral : Care Coordination   An unsuccessful telephone outreach was attempted today. The patient was referred to the case management team for assistance with care management and care coordination.   Follow Up Plan: Telephone follow up appointment with care management team member scheduled for: 04/24/21   Elliot Gurney, [place] Worker  [facility] [phone]

## 2021-04-17 ENCOUNTER — Telehealth: Payer: Self-pay | Admitting: Family Medicine

## 2021-04-17 DIAGNOSIS — F329 Major depressive disorder, single episode, unspecified: Secondary | ICD-10-CM

## 2021-04-17 NOTE — Telephone Encounter (Signed)
Copied from [place] [id number]. Topic: Medicare AWV >> Apr 17, 2021  2:55 PM Cher Nakai R wrote: Reason for CRM:  Left message for patient to call back and schedule Medicare Annual Wellness Visit (AWV) in office.   If not able to come in office, please offer to do virtually or by telephone.   Last AWV:  09/30/2019  Please schedule at anytime with [facility] Health Advisor.  If any questions, please contact me at [phone]

## 2021-04-24 ENCOUNTER — Telehealth: Payer: Medicare Other

## 2021-04-24 ENCOUNTER — Telehealth: Payer: Self-pay | Admitting: *Deleted

## 2021-04-24 NOTE — Telephone Encounter (Signed)
?  Care Management  ? ?Follow Up Note ? ? ?04/24/2021 ?Name: ILER LO MRN: [id number] DOB: 1945-11-20 ? ? ?Referred by: Malva Limes, MD ?Reason for referral : Care Coordination ? ? ?A second unsuccessful telephone outreach was attempted today. The patient was referred to the case management team for assistance with care management and care coordination.  ? ?Follow Up Plan: Telephone follow up appointment with care management team member scheduled for: 05/03/21 ? ? ?Shakayla Hickox, LCSW ?Clinical Social Worker  ? [facility] ?[phone] ? ? ?

## 2021-05-03 ENCOUNTER — Ambulatory Visit (INDEPENDENT_AMBULATORY_CARE_PROVIDER_SITE_OTHER): Payer: Medicare Other | Admitting: *Deleted

## 2021-05-03 NOTE — Patient Instructions (Signed)
Visit Information ? ?Thank you for taking time to visit with me today. Please don't hesitate to contact me if I can be of assistance to you before our next scheduled telephone appointment. ? ?Following are the goals we discussed today:  ? ?- talk about feelings with a friend, family or spiritual advisor ?- practice positive thinking and self-talk  ?-continue to prioritize self care and the establishing of boundaries ?-follow up with medical provider when needed ?-continue to consider personal counseling ? ? ?Our next appointment is by telephone on 05/17/21 at 12 ? ?Please call the care guide team at [phone] if you need to cancel or reschedule your appointment.  ? ?If you are experiencing a Mental Health or [place] or need someone to talk to, please call the Suicide and Crisis Lifeline: 988  ? ?Patient verbalizes understanding of instructions and care plan provided today and agrees to view in [place]. Active MyChart status confirmed with patient.   ? ?Telephone follow up appointment with care management team member scheduled for: 05/17/21 ? ? ?Zayquan Bogard, LCSW ?Clinical Social Worker  ?[facility] ?[phone] ? ?

## 2021-05-04 NOTE — Chronic Care Management (AMB) (Signed)
?Chronic Care Management  ? ? Clinical Social Work Note ? ?05/04/2021 ?Name: [name] MRN: [id number] DOB: 1945-06-12 ? ?[name] is a 76 y.o. year old female who is a primary care patient of [name], [name], MD. The CCM team was consulted to assist the patient with chronic disease management and/or care coordination needs related to: Mental Health Counseling and Resources.  ? ?Engaged with patient by telephone for follow up visit in response to provider referral for social work chronic care management and care coordination services.  ? ?Consent to Services:  ?The patient was given information about Chronic Care Management services, agreed to services, and gave verbal consent prior to initiation of services.  Please see initial visit note for detailed documentation.  ? ?Patient agreed to services and consent obtained.  ? ?Assessment: Review of patient past medical history, allergies, medications, and health status, including review of relevant consultants reports was performed today as part of a comprehensive evaluation and provision of chronic care management and care coordination services.    ? ?SDOH (Social Determinants of Health) assessments and interventions performed:   ? ?Advanced Directives Status: Not addressed in this encounter. ? ?CCM Care Plan ? ?Allergies  ?Allergen Reactions  ? Levofloxacin   ?  Tongue and mouth swelling  ? Calcium Channel Blockers   ?  Other reaction(s): Dizziness  ? Amoxicillin Other (See Comments)  ?  Causes yeast infection ?Has patient had a PCN reaction causing immediate rash, facial/tongue/throat swelling, SOB or lightheadedness with hypotension: No ?Has patient had a PCN reaction causing severe rash involving mucus membranes or skin necrosis: No ?Has patient had a PCN reaction that required hospitalization: No ?Has patient had a PCN reaction occurring within the last 10 years: No ?If all of the above answers are "NO", then may proceed with Cephalosporin use. ?  ?  Nickel Rash  ? Penicillins Other (See Comments)  ?  Causes yeast infection ?Has patient had a PCN reaction causing immediate rash, facial/tongue/throat swelling, SOB or lightheadedness with hypotension: No ?Has patient had a PCN reaction causing severe rash involving mucus membranes or skin necrosis: No ?Has patient had a PCN reaction that required hospitalization: No ?Has patient had a PCN reaction occurring within the last 10 years: No ?If all of the above answers are "NO", then may proceed with Cephalosporin use.  ? ? ?Outpatient Encounter Medications as of 05/03/2021  ?Medication Sig  ? albuterol (VENTOLIN HFA) 108 (90 Base) MCG/ACT inhaler TAKE 2 PUFFS BY MOUTH EVERY 6 HOURS AS NEEDED FOR WHEEZE OR SHORTNESS OF BREATH  ? ALPRAZolam (XANAX) 0.5 MG tablet TAKE 1 TABLET (0.5 MG TOTAL) BY MOUTH 3 (THREE) TIMES DAILY AS NEEDED FOR ANXIETY OR SLEEP.  ? aspirin EC 81 MG tablet Take 81 mg by mouth daily.  ? citalopram (CELEXA) 20 MG tablet Take 1 tablet (20 mg total) by mouth daily.  ? clotrimazole (LOTRIMIN) 1 % cream Apply 1 application topically 2 (two) times daily.  ? fluticasone (FLONASE) 50 MCG/ACT nasal spray PLACE 1-2 SPRAYS INTO BOTH NOSTRILS DAILY AS NEEDED FOR ALLERGIES OR RHINITIS.  ? ibuprofen (ADVIL) 800 MG tablet TAKE 1 TABLET (800 MG TOTAL) BY MOUTH DAILY AS NEEDED (SEVERE PAIN).  ? omeprazole (PRILOSEC) 20 MG capsule Take 20 mg by mouth daily as needed (acid reflux).   ? rosuvastatin (CRESTOR) 40 MG tablet Take 1 tablet (40 mg total) by mouth daily.  ? ?No facility-administered encounter medications on file as of 05/03/2021.  ? ? ?Patient  Active Problem List  ? Diagnosis Date Noted  ? 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 Palpitations 04/14/2020  ? Pure hypercholesterolemia 04/14/2020  ? Small bowel obstruction (HCC)   ? Partial small bowel obstruction (HCC) 02/04/2018  ? SBO (small bowel obstruction) (HCC) 03/13/2017  ? Panic disorder 02/21/2016  ? Weakness of both lower extremities 02/21/2016  ? Depression  02/21/2016  ? Problems with swallowing and mastication   ? Stricture and stenosis of esophagus   ? 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 Acid reflux 10/20/2014  ? Hx of colonic polyps   ? Benign neoplasm of transverse colon   ? Diverticulosis of large intestine without diverticulitis   ? Allergic rhinitis 08/01/2014  ? Anxiety 08/01/2014  ? HLD (hyperlipidemia) 08/01/2014  ? Essential hypertension 08/01/2014  ? Vitamin D deficiency 08/01/2014  ? H/O adenomatous polyp of colon 01/22/2012  ? ? ?Conditions to be addressed/monitored: Anxiety and Depression; Mental Health Concerns  ? ?Care Plan : Depression (Adult)  ?Updates made by Wenda Overland, LCSW since 05/04/2021 12:00 AM  ?  ? ?Problem: Depression Identification (Depression)   ?  ? ?Long-Range Goal: Depressive Symptoms Identified   ?Start Date: 02/16/2020  ?Expected End Date: 01/17/2021  ?Recent Progress: On track  ?Priority: High  ?Note:   ?Current Barriers:  ?Mental Health Concerns  ?Depression ? ?Clinical Social Work Clinical Goal(s):  ?Over the next 90 days, patient will work with SW to address concerns related to identifying coping skills to manager her depression as well as connection her to a local mental health provider for ongoing mental health follow up  ? ?Interventions: ?1:1 collaboration with Malva Limes, MD regarding development and update of comprehensive plan of care as evidenced by provider attestation and co-signature ?Inter-disciplinary care team collaboration (see longitudinal plan of care) ?Continued to be provided with emotional support related to mental health concerns ?Confirmed that depressive symptoms continue, patient discussed feelings of hopelessness, fatigue, decreased motivation with no identified trigger ?Patient's feelings related to mood processed-ongoing counseling recommended-patient continues to consider this  ?Patient provided with validation and encouragement ?Positive coping strategies discussed including boundary setting,   open communication and medication adherance ?Verbalization of feelings encouraged ?Engaged patient in Motivation Interviewing ?Reflective listening/ emotional support provided ? ? ?Patient Goals/Self-Care Activities ?Over the next 90 days, patient will:  ? - Patient will attend all scheduled provider appointments ?Patient will attend church or other social activities ?- consider beginning personal counseling ?- talk about feelings with a friend, family or spiritual advisor ?- practice positive thinking and self-talk ? ? ?Follow up Plan: SW will follow up with patient by phone over the next 14 business days ? ? ?  ?  ? ?Follow Up Plan: SW will follow up with patient by phone over the next 14 business days ?     ? ?Odes Lolli, LCSW ?Clinical Social Worker  ?[facility] ?[phone] ? ? ? ?

## 2021-05-08 ENCOUNTER — Telehealth: Payer: Medicare Other

## 2021-05-10 DIAGNOSIS — K439 Ventral hernia without obstruction or gangrene: Secondary | ICD-10-CM | POA: Diagnosis not present

## 2021-05-17 ENCOUNTER — Ambulatory Visit: Payer: Medicare Other | Admitting: *Deleted

## 2021-05-17 DIAGNOSIS — F329 Major depressive disorder, single episode, unspecified: Secondary | ICD-10-CM

## 2021-05-17 DIAGNOSIS — F419 Anxiety disorder, unspecified: Secondary | ICD-10-CM

## 2021-05-17 NOTE — Chronic Care Management (AMB) (Signed)
?Chronic Care Management  ? ? Clinical Social Work Note ? ?05/17/2021 ?Name: [name] MRN: [id number] DOB: [birth year] ? ?[name] is a 76 y.o. year old female who is a primary care patient of [name], [name], MD. The CCM team was consulted to assist the patient with chronic disease management and/or care coordination needs related to: Mental Health Counseling and Resources.  ? ?Engaged with patient by telephone for follow up visit in response to provider referral for social work chronic care management and care coordination services.  ? ?Consent to Services:  ?The patient was given information about Chronic Care Management services, agreed to services, and gave verbal consent prior to initiation of services.  Please see initial visit note for detailed documentation.  ? ?Patient agreed to services and consent obtained.  ? ?Assessment: Review of patient past medical history, allergies, medications, and health status, including review of relevant consultants reports was performed today as part of a comprehensive evaluation and provision of chronic care management and care coordination services.    ? ?SDOH (Social Determinants of Health) assessments and interventions performed:   ? ?Advanced Directives Status: Not addressed in this encounter. ? ?CCM Care Plan ? ?Allergies  ?Allergen Reactions  ? Levofloxacin   ?  Tongue and mouth swelling  ? Calcium Channel Blockers   ?  Other reaction(s): Dizziness  ? Amoxicillin Other (See Comments)  ?  Causes yeast infection ?Has patient had a PCN reaction causing immediate rash, facial/tongue/throat swelling, SOB or lightheadedness with hypotension: No ?Has patient had a PCN reaction causing severe rash involving mucus membranes or skin necrosis: No ?Has patient had a PCN reaction that required hospitalization: No ?Has patient had a PCN reaction occurring within the last 10 years: No ?If all of the above answers are "NO", then may proceed with Cephalosporin use. ?  ?  Nickel Rash  ? Penicillins Other (See Comments)  ?  Causes yeast infection ?Has patient had a PCN reaction causing immediate rash, facial/tongue/throat swelling, SOB or lightheadedness with hypotension: No ?Has patient had a PCN reaction causing severe rash involving mucus membranes or skin necrosis: No ?Has patient had a PCN reaction that required hospitalization: No ?Has patient had a PCN reaction occurring within the last 10 years: No ?If all of the above answers are "NO", then may proceed with Cephalosporin use.  ? ? ?Outpatient Encounter Medications as of 05/17/2021  ?Medication Sig  ? albuterol (VENTOLIN HFA) 108 (90 Base) MCG/ACT inhaler TAKE 2 PUFFS BY MOUTH EVERY 6 HOURS AS NEEDED FOR WHEEZE OR SHORTNESS OF BREATH  ? ALPRAZolam (XANAX) 0.5 MG tablet TAKE 1 TABLET (0.5 MG TOTAL) BY MOUTH 3 (THREE) TIMES DAILY AS NEEDED FOR ANXIETY OR SLEEP.  ? aspirin EC 81 MG tablet Take 81 mg by mouth daily.  ? citalopram (CELEXA) 20 MG tablet Take 1 tablet (20 mg total) by mouth daily.  ? clotrimazole (LOTRIMIN) 1 % cream Apply 1 application topically 2 (two) times daily.  ? fluticasone (FLONASE) 50 MCG/ACT nasal spray PLACE 1-2 SPRAYS INTO BOTH NOSTRILS DAILY AS NEEDED FOR ALLERGIES OR RHINITIS.  ? ibuprofen (ADVIL) 800 MG tablet TAKE 1 TABLET (800 MG TOTAL) BY MOUTH DAILY AS NEEDED (SEVERE PAIN).  ? omeprazole (PRILOSEC) 20 MG capsule Take 20 mg by mouth daily as needed (acid reflux).   ? rosuvastatin (CRESTOR) 40 MG tablet Take 1 tablet (40 mg total) by mouth daily.  ? ?No facility-administered encounter medications on file as of 05/17/2021.  ? ? ?Patient  Active Problem List  ? Diagnosis Date Noted  ? 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 Palpitations 04/14/2020  ? Pure hypercholesterolemia 04/14/2020  ? Small bowel obstruction (HCC)   ? Partial small bowel obstruction (HCC) 02/04/2018  ? SBO (small bowel obstruction) (HCC) 03/13/2017  ? Panic disorder 02/21/2016  ? Weakness of both lower extremities 02/21/2016  ? Depression  02/21/2016  ? Problems with swallowing and mastication   ? Stricture and stenosis of esophagus   ? 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 Acid reflux 10/20/2014  ? Hx of colonic polyps   ? Benign neoplasm of transverse colon   ? Diverticulosis of large intestine without diverticulitis   ? Allergic rhinitis 08/01/2014  ? Anxiety 08/01/2014  ? HLD (hyperlipidemia) 08/01/2014  ? Essential hypertension 08/01/2014  ? Vitamin D deficiency 08/01/2014  ? H/O adenomatous polyp of colon 01/22/2012  ? ? ?Conditions to be addressed/monitored: Depression; Mental Health Concerns  ? ?Care Plan : Depression (Adult)  ?Updates made by Wenda Overland, LCSW since 05/17/2021 12:00 AM  ?  ? ?Problem: Depression Identification (Depression)   ?  ? ?Long-Range Goal: Depressive Symptoms Identified   ?Start Date: 02/16/2020  ?Expected End Date: 01/17/2021  ?Recent Progress: On track  ?Priority: High  ?Note:   ?Current Barriers:  ?Mental Health Concerns  ?Depression ? ?Clinical Social Work Clinical Goal(s):  ?Over the next 90 days, patient will work with SW to address concerns related to identifying coping skills to manager her depression as well as connection her to a local mental health provider for ongoing mental health follow up  ? ?Interventions: ?1:1 collaboration with Malva Limes, MD regarding development and update of comprehensive plan of care as evidenced by provider attestation and co-signature ?Inter-disciplinary care team collaboration (see longitudinal plan of care) ?Continued to be provided with emotional support related to mental health concerns ?Patient reports improvement in feelings of hopelessness, fatigue, and motivation ?Patient now has a support animal and looking forward to providing for it's care ?Patient's feelings related to improvement in mood processed-ongoing counseling recommended-patient continues to consider this  ?Patient provided with validation and encouragement ?Positive coping strategies discussed including  boundary setting,  open communication and medication adherance ?Verbalization of feelings encouraged ?Engaged patient in Motivation Interviewing ?Reflective listening/ emotional support provided ? ? ?Patient Goals/Self-Care Activities ?Over the next 90 days, patient will:  ? - Patient will attend all scheduled provider appointments ?Patient will attend church or other social activities ?- consider beginning personal counseling ?- talk about feelings with a friend, family or spiritual advisor ?- practice positive thinking and self-talk ? ? ?Follow up Plan: SW will follow up with patient by phone over the next 30 business days ? ? ?  ?  ? ?Follow Up Plan: SW will follow up with patient by phone over the next 30 business days ?     ? ?Ticia Virgo, LCSW ?Clinical Social Worker  ?[facility] ?[phone] ? ? ?

## 2021-05-17 NOTE — Patient Instructions (Signed)
Visit Information ? ?Thank you for taking time to visit with me today. Please don't hesitate to contact me if I can be of assistance to you before our next scheduled telephone appointment. ? ?Following are the goals we discussed today:  ? ?- talk about feelings with a friend, family or spiritual advisor ?- practice positive thinking and self-talk  ?-continue to prioritize self care and the establishing of boundaries ?-follow up with medical provider when needed ?-continue to consider personal counseling ? ? ?Our next appointment is by telephone on 06/14/21 at 10am ? ?Please call the care guide team at [phone] if you need to cancel or reschedule your appointment.  ? ?If you are experiencing a Mental Health or [place] or need someone to talk to, please call the Suicide and Crisis Lifeline: 988  ? ?Patient verbalizes understanding of instructions and care plan provided today and agrees to view in [facility]. Active MyChart status confirmed with patient.   ? ?Telephone follow up appointment with care management team member scheduled for: 06/14/21 ? ?Woodroe Vogan, LCSW ?Clinical Social Worker  ?[facility] ?[phone] ? ?

## 2021-05-18 DIAGNOSIS — F329 Major depressive disorder, single episode, unspecified: Secondary | ICD-10-CM

## 2021-05-28 ENCOUNTER — Encounter: Payer: Self-pay | Admitting: Family Medicine

## 2021-05-28 ENCOUNTER — Ambulatory Visit (INDEPENDENT_AMBULATORY_CARE_PROVIDER_SITE_OTHER): Payer: Medicare Other | Admitting: Family Medicine

## 2021-05-28 VITALS — BP 123/69 | HR 79 | Temp 98.1°F | Resp 16 | Wt 187.0 lb

## 2021-05-28 DIAGNOSIS — R051 Acute cough: Secondary | ICD-10-CM

## 2021-05-28 DIAGNOSIS — J329 Chronic sinusitis, unspecified: Secondary | ICD-10-CM

## 2021-05-28 DIAGNOSIS — J0110 Acute frontal sinusitis, unspecified: Secondary | ICD-10-CM

## 2021-05-28 MED ORDER — FLUCONAZOLE 150 MG PO TABS: 150.0000 mg | ORAL_TABLET | Freq: Once | ORAL | 0 refills | Status: AC

## 2021-05-28 MED ORDER — HYDROCODONE BIT-HOMATROP MBR 5-1.5 MG/5ML PO SOLN: 5.0000 mL | Freq: Three times a day (TID) | ORAL | 0 refills | Status: DC | PRN

## 2021-05-28 MED ORDER — AMOXICILLIN-POT CLAVULANATE 875-125 MG PO TABS: 1.0000 | ORAL_TABLET | Freq: Two times a day (BID) | ORAL | 0 refills | Status: DC

## 2021-05-28 NOTE — Progress Notes (Signed)
?  ? ?I,[name],acting as a scribe for Lelon Huh, MD.,have documented all relevant documentation on the behalf of Lelon Huh, MD,as directed by  Lelon Huh, MD while in the presence of Lelon Huh, MD.  ? ? ? ?Established patient visit ? ? ?Patient: [name]   DOB: 17-Jul-1945   76 y.o. Female  MRN: [id number] ?Visit Date: 05/28/2021 ? ?Today's healthcare provider: Lelon Huh, MD  ? ?Chief Complaint  ?Patient presents with  ? Cough  ? ?Subjective  ?  ?Patient presents for Cough ?This is a new problem. The current episode started in the past 7 days. The problem has been unchanged. The cough is Productive of brown sputum. Associated symptoms include chills, ear congestion, a fever, nasal congestion, postnasal drip and sweats. Pertinent negatives include no chest pain, ear pain, headaches, heartburn, hemoptysis, myalgias, rash, rhinorrhea, sore throat, shortness of breath, weight loss or wheezing. Nothing aggravates the symptoms. She has tried OTC cough suppressant for the symptoms. The treatment provided mild relief. Her past medical history is significant for environmental allergies. There is no history of asthma, bronchiectasis, bronchitis, COPD, emphysema or pneumonia.   ?She did take a home Covid test yesterday which was negative.  ? ?Medications: ?Outpatient Medications Prior to Visit  ?Medication Sig  ? albuterol (VENTOLIN HFA) 108 (90 Base) MCG/ACT inhaler TAKE 2 PUFFS BY MOUTH EVERY 6 HOURS AS NEEDED FOR WHEEZE OR SHORTNESS OF BREATH  ? ALPRAZolam (XANAX) 0.5 MG tablet TAKE 1 TABLET (0.5 MG TOTAL) BY MOUTH 3 (THREE) TIMES DAILY AS NEEDED FOR ANXIETY OR SLEEP.  ? aspirin EC 81 MG tablet Take 81 mg by mouth daily.  ? citalopram (CELEXA) 20 MG tablet Take 1 tablet (20 mg total) by mouth daily.  ? clotrimazole (LOTRIMIN) 1 % cream Apply 1 application topically 2 (two) times daily.  ? fluticasone (FLONASE) 50 MCG/ACT nasal spray PLACE 1-2 SPRAYS INTO BOTH NOSTRILS DAILY AS NEEDED FOR  ALLERGIES OR RHINITIS.  ? ibuprofen (ADVIL) 800 MG tablet TAKE 1 TABLET (800 MG TOTAL) BY MOUTH DAILY AS NEEDED (SEVERE PAIN).  ? omeprazole (PRILOSEC) 20 MG capsule Take 20 mg by mouth daily as needed (acid reflux).   ? rosuvastatin (CRESTOR) 40 MG tablet Take 1 tablet (40 mg total) by mouth daily.  ? ?No facility-administered medications prior to visit.  ? ? ?Review of Systems  ?Constitutional:  Positive for chills and fever. Negative for weight loss.  ?HENT:  Positive for postnasal drip. Negative for ear pain, rhinorrhea and sore throat.   ?Respiratory:  Positive for cough. Negative for hemoptysis, shortness of breath and wheezing.   ?Cardiovascular:  Negative for chest pain.  ?Gastrointestinal:  Negative for heartburn.  ?Musculoskeletal:  Negative for myalgias.  ?Skin:  Negative for rash.  ?Allergic/Immunologic: Positive for environmental allergies.  ?Neurological:  Negative for headaches.  ? ? ?  Objective  ?  ?BP 123/69 (BP Location: Left Arm, Patient Position: Sitting, Cuff Size: Normal)   Pulse 79   Temp 98.1 ?F (36.7 ?C) (Oral)   Resp 16   Wt 187 lb (84.8 kg)   SpO2 96%   BMI 33.13 kg/m?  ? ? ?Physical Exam  ? ?General: Appearance:    Mildly obese female in no acute distress  ?Eyes:    PERRL, conjunctiva/corneas clear, EOM's intact       ?HEENT:   Tender frontal sinuses with yellow nasal discharge.   ?Lungs:     Clear to auscultation bilaterally, respirations unlabored  ?Heart:  Normal heart rate. Normal rhythm. No murmurs, rubs, or gallops.    ?MS:   All extremities are intact.    ?Neurologic:   Awake, alert, oriented x 3. No apparent focal neurological defect.   ?   ?  ? Assessment & Plan  ?  ? ?1. Acute frontal sinusitis, recurrence not specified ? ?- amoxicillin-clavulanate (AUGMENTIN) 875-125 MG tablet; Take 1 tablet by mouth 2 (two) times daily for 7 days.  Dispense: 14 tablet; Refill: 0 ?- fluconazole (DIFLUCAN) 150 MG tablet; Take 1 tablet (150 mg total) by mouth once for 1 dose.  Dispense:  2 tablet; Refill: 0 ? ?2. Acute cough ? ?- HYDROcodone bit-homatropine (HYCODAN) 5-1.5 MG/5ML syrup; Take 5 mLs by mouth every 8 (eight) hours as needed for up to 10 days for cough.  Dispense: 120 mL; Refill: 0  ? ?   ? ?The entirety of the information documented in the History of Present Illness, Review of Systems and Physical Exam were personally obtained by me. Portions of this information were initially documented by the CMA and reviewed by me for thoroughness and accuracy.   ? ? ?Lelon Huh, MD  ?[facility] ?[phone] (phone) ?[phone] (fax) ? ?[facility] ?

## 2021-06-01 ENCOUNTER — Other Ambulatory Visit: Payer: Self-pay | Admitting: Family Medicine

## 2021-06-01 DIAGNOSIS — J0110 Acute frontal sinusitis, unspecified: Secondary | ICD-10-CM

## 2021-06-01 DIAGNOSIS — R051 Acute cough: Secondary | ICD-10-CM

## 2021-06-01 NOTE — Telephone Encounter (Signed)
Requested medication (s) are due for refill today:   Provider to review all 3 ? ?Requested medication (s) are on the active medication list:   Yes for all 3 ? ?Future visit scheduled:   Yes ? ? ?Last ordered: Augmentin 05/28/2021 #14, 0 refills;  Hycodan 05/28/2021 120 ml, 0 refills;  Diflucan prescribed today by a historical provider (per note it is Dr. [name]) ? ?Returned because no protocols assigned to these medications except Hycodan is a non delegated refill  ? ?Requested Prescriptions  ?Pending Prescriptions Disp Refills  ? amoxicillin-clavulanate (AUGMENTIN) 875-125 MG tablet 14 tablet 0  ?  Sig: Take 1 tablet by mouth 2 (two) times daily for 7 days.  ?  ? Off-Protocol Failed - 06/01/2021  8:31 AM  ?  ?  Failed - Medication not assigned to a protocol, review manually.  ?  ?  Passed - Valid encounter within last 12 months  ?  Recent Outpatient Visits   ? ?      ? 4 days ago Acute frontal sinusitis, recurrence not specified  ? [facility] Birdie Sons, MD  ? 3 months ago Vaginal itching  ? [place], DO  ? 7 months ago Essential hypertension  ? [facility] [name], [name], MD  ? 1 year ago Urinary urgency  ? [facility] Birdie Sons, MD  ? 2 years ago Elevated blood pressure reading  ? [facility] [name], [name], MD  ? ?  ?  ?Future Appointments   ? ?        ? In 2 weeks [name], [name], MD [facility]  ? ?  ? ? ?  ?  ?  ? HYDROcodone bit-homatropine (HYCODAN) 5-1.5 MG/5ML syrup 120 mL 0  ?  Sig: Take 5 mLs by mouth every 8 (eight) hours as needed for up to 10 days for cough.  ?  ? Off-Protocol Failed - 06/01/2021  8:31 AM  ?  ?  Failed - Medication not assigned to a protocol, review manually.  ?  ?  Passed - Valid encounter within last 12 months  ?  Recent Outpatient Visits   ? ?      ? 4 days ago Acute frontal sinusitis, recurrence not specified  ? [facility] Birdie Sons, MD  ? 3 months ago Vaginal itching  ? [place], DO  ? 7 months ago Essential hypertension  ? [facility] [name], [name], MD  ? 1 year ago Urinary urgency  ? [facility] Birdie Sons, MD  ? 2 years ago Elevated blood pressure reading  ? [facility] [name], [name], MD  ? ?  ?  ?Future Appointments   ? ?        ? In 2 weeks [name], [name], MD [facility]  ? ?  ? ? ?  ?  ?  ? fluconazole (DIFLUCAN) 150 MG tablet 1 tablet 0  ?  Sig: Take 1 tablet (150 mg total) by mouth once for 1 dose.  ?  ? Off-Protocol Failed - 06/01/2021  8:31 AM  ?  ?  Failed - Medication not assigned to a protocol, review manually.  ?  ?  Passed - Valid encounter within last 12 months  ?  Recent Outpatient Visits   ? ?      ? 4 days ago Acute  frontal sinusitis, recurrence not specified  ? [facility] Birdie Sons, MD  ? 3 months ago Vaginal itching  ? [place], DO  ? 7 months ago Essential hypertension  ? [facility] [name], [name], MD  ? 1 year ago Urinary urgency  ? [facility] Birdie Sons, MD  ? 2 years ago Elevated blood pressure reading  ? [facility] [name], [name], MD  ? ?  ?  ?Future Appointments   ? ?        ? In 2 weeks [name], [name], MD [facility]  ? ?  ? ? ?  ?  ?  ?Signed Prescriptions Disp Refills  ? fluconazole (DIFLUCAN) 150 MG tablet    ?  Sig: Take 150 mg by mouth once.  ?  ? There is no refill protocol information for this order  ?  ? ?

## 2021-06-01 NOTE — Telephone Encounter (Signed)
Medication Refill - Medication: ?HYDROcodone bit-homatropine (HYCODAN) 5-1.5 MG/5ML syrup  ?amoxicillin-clavulanate (AUGMENTIN) 875-125 MG tablet  ?fluconazole (DIFLUCAN) 150 MG tablet ? ?Has the patient contacted their pharmacy? Yes.   ?Contact PCP ? ?Preferred Pharmacy (with phone number or street name):  ?[facility] [address] [facility] [address] [place]  ?[place], [place] ([place]) [place] [address]  ?Phone:  [phone]  Fax:  [phone]  ? ?Has the patient been seen for an appointment in the last year OR does the patient have an upcoming appointment? Yes. ? ?Agent: Please be advised that RX refills may take up to 3 business days. We ask that you follow-up with your pharmacy. ?

## 2021-06-02 ENCOUNTER — Other Ambulatory Visit: Payer: Self-pay | Admitting: Family Medicine

## 2021-06-02 DIAGNOSIS — J0110 Acute frontal sinusitis, unspecified: Secondary | ICD-10-CM

## 2021-06-02 DIAGNOSIS — R059 Cough, unspecified: Secondary | ICD-10-CM | POA: Diagnosis not present

## 2021-06-02 DIAGNOSIS — R918 Other nonspecific abnormal finding of lung field: Secondary | ICD-10-CM | POA: Diagnosis not present

## 2021-06-02 DIAGNOSIS — R5383 Other fatigue: Secondary | ICD-10-CM | POA: Diagnosis not present

## 2021-06-02 DIAGNOSIS — R042 Hemoptysis: Secondary | ICD-10-CM | POA: Diagnosis not present

## 2021-06-02 DIAGNOSIS — R0981 Nasal congestion: Secondary | ICD-10-CM | POA: Diagnosis not present

## 2021-06-02 DIAGNOSIS — R062 Wheezing: Secondary | ICD-10-CM | POA: Diagnosis not present

## 2021-06-02 DIAGNOSIS — R509 Fever, unspecified: Secondary | ICD-10-CM | POA: Diagnosis not present

## 2021-06-02 DIAGNOSIS — J90 Pleural effusion, not elsewhere classified: Secondary | ICD-10-CM | POA: Diagnosis not present

## 2021-06-02 MED ORDER — FLUCONAZOLE 150 MG PO TABS
150.0000 mg | ORAL_TABLET | Freq: Once | ORAL | 0 refills | Status: AC
Start: 2021-06-02 — End: 2021-06-02

## 2021-06-02 MED ORDER — AMOXICILLIN-POT CLAVULANATE 875-125 MG PO TABS: 1.0000 | ORAL_TABLET | Freq: Two times a day (BID) | ORAL | 0 refills | Status: AC

## 2021-06-02 MED ORDER — HYDROCODONE BIT-HOMATROP MBR 5-1.5 MG/5ML PO SOLN: 5.0000 mL | Freq: Three times a day (TID) | ORAL | 0 refills | Status: AC | PRN

## 2021-06-04 ENCOUNTER — Ambulatory Visit: Payer: Self-pay | Admitting: *Deleted

## 2021-06-04 ENCOUNTER — Other Ambulatory Visit: Payer: Self-pay | Admitting: Family Medicine

## 2021-06-04 NOTE — Telephone Encounter (Signed)
?  Chief Complaint: Cough ?Symptoms: Coughing up streaks of blood, minimal amounts ?Frequency: Saw Dr. [name] 4/10, ED over weekend. Hemoptysis started over weekend. ?Pertinent Negatives: Patient denies fever, SOB ?Disposition: '[]'$ ED /'[]'$ Urgent Care (no appt availability in office) / '[]'$ Appointment(In office/virtual)/ '[]'$  [place] [facility]/ '[]'$ Home Care/ '[]'$ Refused Recommended Disposition /'[]'$ [place] [facility]/ '[x]'$  Follow-up with PCP ?Additional Notes: States placed on antibiotics in ED. Calling for Hycodan. Made aware according to ED notes Hycodan was called into pharmacy. Please advise if Dr. [name] would like pt to be seen again. Advised to CB if symptoms worsen.  ?Reason for Disposition ? Coughing up rusty-colored sputum ? ?Answer Assessment - Initial Assessment Questions ?1. ONSET: "When did the cough begin?"  ?    Saw Dr. [name] 4/10, ED this Weekend ?2. SEVERITY: "How bad is the cough today?" "Did the blood appear after a coughing spell?"  ?    BAD coughing spells ?3. SPUTUM: "Describe the color of your sputum" (none, dry cough; clear, white, yellow, green) ?    Thick greenish at times ?4. HEMOPTYSIS: "How much blood?" (flecks, streaks, tablespoons, etc.) ?    Streaks, minimal amount ?5. DIFFICULTY BREATHING: "Are you having difficulty breathing?" If Yes, ask: "How bad is it?" (e.g., mild, moderate, severe)  ?  - MILD: No SOB at rest, mild SOB with walking, speaks normally in sentences, can lie down, no retractions, pulse < 100.  ?  - MODERATE: SOB at rest, SOB with minimal exertion and prefers to sit, cannot lie down flat, speaks in phrases, mild retractions, audible wheezing, pulse 100-120.  ?  - SEVERE: Very SOB at rest, speaks in single words, struggling to breathe, sitting hunched forward, retractions, pulse > 120  ?    no ?6. FEVER: "Do you have a fever?" If Yes, ask: "What is your temperature, how was it measured, and when did it start?" ?    no ?7. CARDIAC HISTORY: "Do you have any history of  heart disease?" (e.g., heart attack, congestive heart failure)  ?    *No Answer* ?8. LUNG HISTORY: "Do you have any history of lung disease?"  (e.g., pulmonary embolus, asthma, emphysema) ?    *No Answer* ?9. PE RISK FACTORS: "Do you have a history of blood clots?" (or: recent major surgery, recent prolonged travel, bedridden) ?    *No Answer* ?10. OTHER SYMPTOMS: "Do you have any other symptoms?" (e.g., runny nose, wheezing, chest pain) ? ?Protocols used: Coughing Up Blood-A-AH ? ?

## 2021-06-06 NOTE — Telephone Encounter (Signed)
Patient states she is "hangin in there and feels about 50% better." No problems with abx.   ?

## 2021-06-06 NOTE — Telephone Encounter (Signed)
Was started on azithromycin from ER on 4/15. Please see if any trouble with new abs and if doing any better the last day or two.  ?

## 2021-06-13 ENCOUNTER — Ambulatory Visit: Payer: Self-pay

## 2021-06-13 NOTE — Telephone Encounter (Signed)
Pt has been sick for a couple weeks and still has a bad cough at times / she has been prescribed medication a couple times but nothing has helped get rid of the cough /Pt asked what Dr. [name] would advise her to do / please advise  ? ? ? ? ?Chief Complaint: Still coughing up light green mucus with flecks of blood.  ?Symptoms: SOB with coughing spells. ?Frequency: 3 weeks ago. ?Pertinent Negatives: Patient denies fever ?Disposition: '[]'$ ED /'[]'$ Urgent Care (no appt availability in office) / '[]'$ Appointment(In office/virtual)/ '[]'$  [place] [place] [facility]/ '[]'$ Home Care/ '[]'$ Refused Recommended Disposition /'[]'$ [place]/ '[x]'$  Follow-up with PCP ?Additional Notes: Pt. Asking for cough medication and "maybe more antibiotics."  ?Answer Assessment - Initial Assessment Questions ?1. ONSET: "When did the cough begin?"  ?    3 weeks ago ?2. SEVERITY: "How bad is the cough today?"  ?    Severe ?3. SPUTUM: "Describe the color of your sputum" (none, dry cough; clear, white, yellow, green) ?    Light green ?4. HEMOPTYSIS: "Are you coughing up any blood?" If so ask: "How much?" (flecks, streaks, tablespoons, etc.) ?    Yes ?5. DIFFICULTY BREATHING: "Are you having difficulty breathing?" If Yes, ask: "How bad is it?" (e.g., mild, moderate, severe)  ?  - MILD: No SOB at rest, mild SOB with walking, speaks normally in sentences, can lie down, no retractions, pulse < 100.  ?  - MODERATE: SOB at rest, SOB with minimal exertion and prefers to sit, cannot lie down flat, speaks in phrases, mild retractions, audible wheezing, pulse 100-120.  ?  - SEVERE: Very SOB at rest, speaks in single words, struggling to breathe, sitting hunched forward, retractions, pulse > 120  ?    Mild - with coughing spells ?6. FEVER: "Do you have a fever?" If Yes, ask: "What is your temperature, how was it measured, and when did it start?" ?    No ?7. CARDIAC HISTORY: "Do you have any history of heart disease?" (e.g., heart attack, congestive heart failure)  ?     No ?8. LUNG HISTORY: "Do you have any history of lung disease?"  (e.g., pulmonary embolus, asthma, emphysema) ?    No ?9. PE RISK FACTORS: "Do you have a history of blood clots?" (or: recent major surgery, recent prolonged travel, bedridden) ?    No ?10. OTHER SYMPTOMS: "Do you have any other symptoms?" (e.g., runny nose, wheezing, chest pain) ?      Runny nose ?11. PREGNANCY: "Is there any chance you are pregnant?" "When was your last menstrual period?" ?      No ?12. TRAVEL: "Have you traveled out of the country in the last month?" (e.g., travel history, exposures) ?      No ? ?Protocols used: Cough - Acute Productive-A-AH ? ?

## 2021-06-14 ENCOUNTER — Ambulatory Visit (INDEPENDENT_AMBULATORY_CARE_PROVIDER_SITE_OTHER): Payer: Medicare Other

## 2021-06-14 ENCOUNTER — Telehealth: Payer: Self-pay | Admitting: *Deleted

## 2021-06-14 ENCOUNTER — Ambulatory Visit (INDEPENDENT_AMBULATORY_CARE_PROVIDER_SITE_OTHER): Payer: Medicare Other | Admitting: *Deleted

## 2021-06-14 VITALS — Wt 187.0 lb

## 2021-06-14 DIAGNOSIS — Z0000 Encounter for general adult medical examination without abnormal findings: Secondary | ICD-10-CM

## 2021-06-14 DIAGNOSIS — F329 Major depressive disorder, single episode, unspecified: Secondary | ICD-10-CM

## 2021-06-14 DIAGNOSIS — F419 Anxiety disorder, unspecified: Secondary | ICD-10-CM

## 2021-06-14 NOTE — Patient Instructions (Signed)
Visit Information ? ?Thank you for taking time to visit with me today. Please don't hesitate to contact me if I can be of assistance to you before our next scheduled telephone appointment. ? ?Following are the goals we discussed today:  ? ?- talk about feelings with a friend, family or spiritual advisor ?- practice positive thinking and self-talk  ?-continue to prioritize self care and the establishing of boundaries ?-follow up with medical provider when needed ?-continue to consider personal counseling ? ?  ? ?Our next appointment is by telephone on 06/28/21 at 10am ? ?Please call the care guide team at [phone] if you need to cancel or reschedule your appointment.  ? ?If you are experiencing a Mental Health or [place] or need someone to talk to, please call the Suicide and Crisis Lifeline: 988  ? ?Patient verbalizes understanding of instructions and care plan provided today and agrees to view in [place]. Active MyChart status confirmed with patient.   ? ?Telephone follow up appointment with care management team member scheduled for: 06/28/21 ? ? ?Jamiah Homeyer, LCSW ?Clinical Social Worker  ?[facility] ?[phone] ? ?

## 2021-06-14 NOTE — Progress Notes (Signed)
?Virtual Visit via Telephone Note ? ?I connected with  [name] on 06/14/21 at  3:45 PM EDT by telephone and verified that I am speaking with the correct person using two identifiers. ? ?Location: ?Patient: home ?Provider: [facility] ?Persons participating in the virtual visit: patient/Nurse Health Advisor ?  ?I discussed the limitations, risks, security and privacy concerns of performing an evaluation and management service by telephone and the availability of in person appointments. The patient expressed understanding and agreed to proceed. ? ?Interactive audio and video telecommunications were attempted between this nurse and patient, however failed, due to patient having technical difficulties OR patient did not have access to video capability.  We continued and completed visit with audio only. ? ?Some vital signs may be absent or patient reported.  ? ?Dionisio David, LPN ? ?Subjective:  ? [name] is a 76 y.o. female who presents for Medicare Annual (Subsequent) preventive examination. ? ?Review of Systems    ? ?  ? ?   ?Objective:  ?  ?There were no vitals filed for this visit. ?There is no height or weight on file to calculate BMI. ? ? ?  08/23/2020  ?  3:31 PM 04/02/2020  ?  2:56 AM 11/08/2019  ?  4:02 PM 10/29/2019  ? 12:10 PM 10/27/2019  ?  9:18 AM 09/30/2019  ?  1:38 PM 09/22/2019  ?  3:02 AM  ?Advanced Directives  ?Does Patient Have a Medical Advance Directive? No No Yes Yes Yes No   ?Type of Advance Directive   Living will Living will [place]  ?Does patient want to make changes to medical advance directive?   No - Patient declined    No - Patient declined  ?Copy of [place] in Chart?       No - copy requested  ?Would patient like information on creating a medical advance directive? No - Patient declined No - Patient declined    No - Patient declined   ? ? ?Current Medications (verified) ?Outpatient Encounter Medications as of 06/14/2021   ?Medication Sig  ? Acetaminophen-Codeine 300-30 MG tablet acetaminophen 300 mg-codeine 30 mg tablet ? Take 1 tablet every 6 hours by oral route for 5 days.  ? albuterol (VENTOLIN HFA) 108 (90 Base) MCG/ACT inhaler TAKE 2 PUFFS BY MOUTH EVERY 6 HOURS AS NEEDED FOR WHEEZE OR SHORTNESS OF BREATH  ? albuterol (VENTOLIN HFA) 108 (90 Base) MCG/ACT inhaler Inhale into the lungs.  ? ALPRAZolam (XANAX) 0.5 MG tablet TAKE 1 TABLET (0.5 MG TOTAL) BY MOUTH 3 (THREE) TIMES DAILY AS NEEDED FOR ANXIETY OR SLEEP.  ? aspirin EC 81 MG tablet Take 81 mg by mouth daily.  ? azithromycin (ZITHROMAX) 250 MG tablet Take by mouth.  ? citalopram (CELEXA) 10 MG tablet TAKE 1 TABLET BY MOUTH EVERY DAY  ? citalopram (CELEXA) 20 MG tablet Take 1 tablet (20 mg total) by mouth daily.  ? clindamycin (CLEOCIN) 150 MG capsule Take 150 mg by mouth 3 (three) times daily.  ? clotrimazole (LOTRIMIN) 1 % cream Apply 1 application topically 2 (two) times daily.  ? fluconazole (DIFLUCAN) 150 MG tablet Take 150 mg by mouth once.  ? fluticasone (FLONASE) 50 MCG/ACT nasal spray PLACE 1-2 SPRAYS INTO BOTH NOSTRILS DAILY AS NEEDED FOR ALLERGIES OR RHINITIS.  ? ibuprofen (ADVIL) 800 MG tablet TAKE 1 TABLET (800 MG TOTAL) BY MOUTH DAILY AS NEEDED (SEVERE PAIN).  ? omeprazole (PRILOSEC) 20 MG  capsule Take 20 mg by mouth daily as needed (acid reflux).   ? rosuvastatin (CRESTOR) 40 MG tablet Take 1 tablet (40 mg total) by mouth daily.  ? Spacer/Aero-Holding Chambers (EASIVENT) inhaler Use as needed with inhaler  ? Spacer/Aero-Holding Chambers (EQ SPACE CHAMBER ANTI-STATIC) DEVI Inhale into the lungs as needed.  ? ?No facility-administered encounter medications on file as of 06/14/2021.  ? ? ?Allergies (verified) ?Levofloxacin, Calcium channel blockers, Amoxicillin, Nickel, and Penicillins  ? ?History: ?Past Medical History:  ?Diagnosis Date  ? Abdominal hernia   ? Anxiety   ? Arthritis   ? lower spine  ? Back pain   ? lower back - S/P lifting injury  ? Complication  of anesthesia   ? makes hair brittle  ? GERD (gastroesophageal reflux disease)   ? Hyperlipidemia   ? Hypertension   ? Neuromuscular disorder (HCC)   ? numbness legs and feet s/p lower back injury  ? Partial bowel obstruction ([place]) 02/04/2018  ? Stroke [facility])   ? "mini - strokes" 2009 - no deficit  ? Vertigo   ? none recently  ? Vitamin D deficiency   ? Wears contact lenses   ? ?Past Surgical History:  ?Procedure Laterality Date  ? ABDOMINAL HYSTERECTOMY  1987  ? CARDIAC CATHETERIZATION  02/2007  ? COLON SURGERY  03/12/2012  ? Dr Phylis Bougie  ? COLONOSCOPY WITH PROPOFOL N/A 09/26/2014  ? Procedure: COLONOSCOPY WITH PROPOFOL;  Surgeon: Lucilla Lame, MD;  Location: [place];  Service: Endoscopy;  Laterality: N/A;  marker (tattoo) used in colon  ? COLONOSCOPY WITH PROPOFOL N/A 10/27/2019  ? Procedure: COLONOSCOPY WITH PROPOFOL;  Surgeon: Robert Bellow, MD;  Location[facility] ENDOSCOPY;  Service: Endoscopy;  Laterality: N/A;  ? ESOPHAGEAL DILATION N/A 02/02/2016  ? Procedure: ESOPHAGEAL DILATION;  Surgeon: Lucilla Lame, MD;  Location: [place];  Service: Endoscopy;  Laterality: N/A;  ? ESOPHAGOGASTRODUODENOSCOPY (EGD) WITH PROPOFOL N/A 02/02/2016  ? Procedure: ESOPHAGOGASTRODUODENOSCOPY (EGD) WITH PROPOFOL;  Surgeon: Lucilla Lame, MD;  Location: [place];  Service: Endoscopy;  Laterality: N/A;  ? LAPAROSCOPIC ABDOMINAL EXPLORATION N/A 11/08/2019  ? Procedure: LAPAROSCOPIC ABDOMINAL EXPLORATION;  Surgeon: Robert Bellow, MD;  Location: [facility];  Service: General;  Laterality: N/A;  POSSIBLE LAPAROTOMY  ? POLYPECTOMY  09/26/2014  ? Procedure: POLYPECTOMY;  Surgeon: Lucilla Lame, MD;  Location: [place];  Service: Endoscopy;;  ? [place]  ? VENTRAL HERNIA REPAIR N/A 11/08/2019  ? Procedure: HERNIA REPAIR VENTRAL ADULT;  Surgeon: Robert Bellow, MD;  Location: [facility];  Service: General;  Laterality: N/A;  possible ventral hernia repair  ? ?Family History  ?Problem Relation  Age of Onset  ? Diabetes Mother   ? Hypertension Mother   ? Mental illness Mother   ? Heart failure Mother 10  ? Cancer Father   ?     lung cancer  ? Drug abuse Brother   ? Cancer Brother   ? Multiple sclerosis Brother   ? ?Social History  ? ?Socioeconomic History  ? Marital status: Married  ?  Spouse name: Not on file  ? Number of children: 3  ? Years of education: Not on file  ? Highest education level: 12th grade  ?Occupational History  ? Occupation: retired  ?Tobacco Use  ? Smoking status: Never  ? Smokeless tobacco: Never  ?Vaping Use  ? Vaping Use: Never used  ?Substance and Sexual Activity  ? Alcohol use: No  ?  Alcohol/week: 0.0 standard drinks  ?  Drug use: No  ? Sexual activity: Not Currently  ?Other Topics Concern  ? Not on file  ?Social History Narrative  ? Not on file  ? ?Social Determinants of Health  ? ?Financial Resource Strain: Not on file  ?Food Insecurity: Not on file  ?Transportation Needs: Not on file  ?Physical Activity: Not on file  ?Stress: Not on file  ?Social Connections: Not on file  ? ? ?Tobacco Counseling ?Counseling given: Not Answered ? ? ?Clinical Intake: ? ?Pre-visit preparation completed: Yes ? ?Pain : No/denies pain ? ?  ? ?Nutritional Risks: None ?Diabetes: No ? ?How often do you need to have someone help you when you read instructions, pamphlets, or other written materials from your doctor or pharmacy?: 1 - Never ? ?Diabetic?no ? ?Interpreter Needed?: No ? ?Information entered by :: Kirke Shaggy, LPN ? ? ?Activities of Daily Living ? ?  05/28/2021  ? 10:59 AM  ?In your present state of health, do you have any difficulty performing the following activities:  ?Hearing? 1  ?Vision? 1  ?Difficulty concentrating or making decisions? 1  ?Walking or climbing stairs? 0  ?Dressing or bathing? 0  ?Doing errands, shopping? 0  ? ? ?Patient Care Team: ?Birdie Sons, MD as PCP - General (Family Medicine) ?Nelva Bush, MD as PCP - Cardiology (Cardiology) ?Lucilla Lame, MD as Consulting  Physician (Gastroenterology) ?Robert Bellow, MD as Consulting Physician (General Surgery) ?Land, [place], [place] as Education officer, museum ?Nice, Reed Breech, OD (Optometry) ? ?Indicate any recent Medical Serv

## 2021-06-14 NOTE — Telephone Encounter (Addendum)
Patient advised that she needs to schedule office visit for evaluation of symptoms. Virtual appointment scheduled with Dr. [name] for Friday 06/15/2021 at 9:20am.   ?

## 2021-06-14 NOTE — Telephone Encounter (Signed)
Copied from [place] [id number]. Topic: General - Other ?>> Jun 13, 2021  4:59 PM Pawlus, Brayton Layman A wrote: ?Reason for CRM: Pt spoke with NT today 4/26, see TE, pt called back stating she was waiting for a follow up call, please advise. ?

## 2021-06-14 NOTE — Chronic Care Management (AMB) (Signed)
?Chronic Care Management  ? ? Clinical Social Work Note ? ?06/14/2021 ?Name: [name] MRN: [id number] DOB: Apr 24, 1945 ? ?[name] is a 76 y.o. year old female who is a primary care patient of [name], [name], MD. The CCM team was consulted to assist the patient with chronic disease management and/or care coordination needs related to: Mental Health Counseling and Resources.  ? ?Engaged with patient by telephone for follow up visit in response to provider referral for social work chronic care management and care coordination services.  ? ?Consent to Services:  ?The patient was given information about Chronic Care Management services, agreed to services, and gave verbal consent prior to initiation of services.  Please see initial visit note for detailed documentation.  ? ?Patient agreed to services and consent obtained.  ? ?Assessment: Review of patient past medical history, allergies, medications, and health status, including review of relevant consultants reports was performed today as part of a comprehensive evaluation and provision of chronic care management and care coordination services.    ? ?SDOH (Social Determinants of Health) assessments and interventions performed:   ? ?Advanced Directives Status: Not addressed in this encounter. ? ?CCM Care Plan ? ?Allergies  ?Allergen Reactions  ? Levofloxacin   ?  Tongue and mouth swelling  ? Calcium Channel Blockers   ?  Other reaction(s): Dizziness  ? Amoxicillin Other (See Comments)  ?  Causes yeast infection ?Has patient had a PCN reaction causing immediate rash, facial/tongue/throat swelling, SOB or lightheadedness with hypotension: No ?Has patient had a PCN reaction causing severe rash involving mucus membranes or skin necrosis: No ?Has patient had a PCN reaction that required hospitalization: No ?Has patient had a PCN reaction occurring within the last 10 years: No ?If all of the above answers are "NO", then may proceed with Cephalosporin use. ?  ?  Nickel Rash  ? Penicillins Other (See Comments)  ?  Causes yeast infection ?Has patient had a PCN reaction causing immediate rash, facial/tongue/throat swelling, SOB or lightheadedness with hypotension: No ?Has patient had a PCN reaction causing severe rash involving mucus membranes or skin necrosis: No ?Has patient had a PCN reaction that required hospitalization: No ?Has patient had a PCN reaction occurring within the last 10 years: No ?If all of the above answers are "NO", then may proceed with Cephalosporin use.  ? ? ?Outpatient Encounter Medications as of 06/14/2021  ?Medication Sig  ? Acetaminophen-Codeine 300-30 MG tablet acetaminophen 300 mg-codeine 30 mg tablet ? Take 1 tablet every 6 hours by oral route for 5 days.  ? albuterol (VENTOLIN HFA) 108 (90 Base) MCG/ACT inhaler TAKE 2 PUFFS BY MOUTH EVERY 6 HOURS AS NEEDED FOR WHEEZE OR SHORTNESS OF BREATH  ? albuterol (VENTOLIN HFA) 108 (90 Base) MCG/ACT inhaler Inhale into the lungs.  ? ALPRAZolam (XANAX) 0.5 MG tablet TAKE 1 TABLET (0.5 MG TOTAL) BY MOUTH 3 (THREE) TIMES DAILY AS NEEDED FOR ANXIETY OR SLEEP.  ? aspirin EC 81 MG tablet Take 81 mg by mouth daily.  ? azithromycin (ZITHROMAX) 250 MG tablet Take by mouth.  ? citalopram (CELEXA) 10 MG tablet TAKE 1 TABLET BY MOUTH EVERY DAY  ? citalopram (CELEXA) 20 MG tablet Take 1 tablet (20 mg total) by mouth daily.  ? clindamycin (CLEOCIN) 150 MG capsule Take 150 mg by mouth 3 (three) times daily.  ? clotrimazole (LOTRIMIN) 1 % cream Apply 1 application topically 2 (two) times daily.  ? fluconazole (DIFLUCAN) 150 MG tablet Take 150 mg by  mouth once.  ? fluticasone (FLONASE) 50 MCG/ACT nasal spray PLACE 1-2 SPRAYS INTO BOTH NOSTRILS DAILY AS NEEDED FOR ALLERGIES OR RHINITIS.  ? ibuprofen (ADVIL) 800 MG tablet TAKE 1 TABLET (800 MG TOTAL) BY MOUTH DAILY AS NEEDED (SEVERE PAIN).  ? omeprazole (PRILOSEC) 20 MG capsule Take 20 mg by mouth daily as needed (acid reflux).   ? rosuvastatin (CRESTOR) 40 MG tablet Take 1  tablet (40 mg total) by mouth daily.  ? Spacer/Aero-Holding Chambers (EASIVENT) inhaler Use as needed with inhaler  ? Spacer/Aero-Holding Chambers (EQ SPACE CHAMBER ANTI-STATIC) DEVI Inhale into the lungs as needed.  ? ?No facility-administered encounter medications on file as of 06/14/2021.  ? ? ?Patient Active Problem List  ? Diagnosis Date Noted  ? Closed fracture of lateral malleolus of right fibula 10/11/2020  ? 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 Palpitations 04/14/2020  ? Pure hypercholesterolemia 04/14/2020  ? Cardiac syncope 04/04/2020  ? Small bowel obstruction (HCC)   ? Partial small bowel obstruction (HCC) 02/04/2018  ? SBO (small bowel obstruction) (HCC) 03/13/2017  ? Panic disorder 02/21/2016  ? Weakness of both lower extremities 02/21/2016  ? Depression 02/21/2016  ? Problems with swallowing and mastication   ? Stricture and stenosis of esophagus   ? 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 Acid reflux 10/20/2014  ? Hx of colonic polyps   ? Benign neoplasm of transverse colon   ? Diverticulosis of large intestine without diverticulitis   ? Allergic rhinitis 08/01/2014  ? Anxiety 08/01/2014  ? HLD (hyperlipidemia) 08/01/2014  ? Essential hypertension 08/01/2014  ? Vitamin D deficiency 08/01/2014  ? H/O adenomatous polyp of colon 01/22/2012  ? ? ?Conditions to be addressed/monitored: Anxiety; Mental Health Concerns  ? ?Care Plan : Depression (Adult)  ?Updates made by Wenda Overland, LCSW since 06/14/2021 12:00 AM  ?  ? ?Problem: Depression Identification (Depression)   ?  ? ?Long-Range Goal: Depressive Symptoms Identified   ?Start Date: 02/16/2020  ?Expected End Date: 01/17/2021  ?Recent Progress: On track  ?Priority: High  ?Note:   ?Current Barriers:  ?Mental Health Concerns  ?Depression ? ?Clinical Social Work Clinical Goal(s):  ?Over the next 90 days, patient will work with SW to address concerns related to identifying coping skills to manager her depression as well as connection her to a local mental health  provider for ongoing mental health follow up  ? ?Interventions: ?1:1 collaboration with Malva Limes, MD regarding development and update of comprehensive plan of care as evidenced by provider attestation and co-signature ?Inter-disciplinary care team collaboration (see longitudinal plan of care) ?Continued to be provided with emotional support related to mental health and medical concerns ?Patient reports consistent coughing and sights of blood, patient seen by ED and is awaiting call from primary care doctor for further recommendations ?Patient's anxiety symptoms exacerbated by current medical challenges ?Patient provided with emotional support  and encouragement to remain in communication with primary care doctor regarding current symptoms ?Positive coping strategies discussed including open communication and medication adherance ? ? ?Patient Goals/Self-Care Activities ?Over the next 90 days, patient will:  ? - Patient will attend all scheduled provider appointments ?Patient will attend church or other social activities ?- consider beginning personal counseling ?- talk about feelings with a friend, family or spiritual advisor ?- practice positive thinking and self-talk ? ? ?Follow up Plan: SW will follow up with patient by phone over the next 30 business days ? ? ?  ?  ? ?Follow Up Plan: SW will follow up with  patient by phone over the next 14 business days ?     ? ?Carel Carrier, LCSW ?Clinical Social Worker  ?[facility] ?[phone] ? ? ? ?

## 2021-06-14 NOTE — Telephone Encounter (Signed)
Pt called again  - She has not gotten a call back. S/S remain the same. Coughing so hard so often there is small amount of blood. Pt is not sob unless coughing.  ?Pt does not want to come into the office. She wants medication called into pharmacy to help with cough. ?  ?

## 2021-06-14 NOTE — Telephone Encounter (Signed)
Please advise? See previous nurse triage note.  ?

## 2021-06-14 NOTE — Patient Instructions (Signed)
[name] , ?Thank you for taking time to come for your Medicare Wellness Visit. I appreciate your ongoing commitment to your health goals. Please review the following plan we discussed and let me know if I can assist you in the future.  ? ?Screening recommendations/referrals: ?Colonoscopy: 10/27/19, declined referral ?Mammogram: declined referral ?Bone Density: declined referral ?Recommended yearly ophthalmology/optometry visit for glaucoma screening and checkup ?Recommended yearly dental visit for hygiene and checkup ? ?Vaccinations: ?Influenza vaccine: n/c ?Pneumococcal vaccine: 01/19/14 ?Tdap vaccine: n/d ?Shingles vaccine: n/d   ?Covid-19:n/d ? ?Advanced directives: no ? ?Conditions/risks identified: none ? ?Next appointment: Follow up in one year for your annual wellness visit 06/19/22 @ 1:30pm by phone ? ? ?Preventive Care 73 Years and Older, Female ?Preventive care refers to lifestyle choices and visits with your health care provider that can promote health and wellness. ?What does preventive care include? ?A yearly physical exam. This is also called an annual well check. ?Dental exams once or twice a year. ?Routine eye exams. Ask your health care provider how often you should have your eyes checked. ?Personal lifestyle choices, including: ?Daily care of your teeth and gums. ?Regular physical activity. ?Eating a healthy diet. ?Avoiding tobacco and drug use. ?Limiting alcohol use. ?Practicing safe sex. ?Taking low-dose aspirin every day. ?Taking vitamin and mineral supplements as recommended by your health care provider. ?What happens during an annual well check? ?The services and screenings done by your health care provider during your annual well check will depend on your age, overall health, lifestyle risk factors, and family history of disease. ?Counseling  ?Your health care provider may ask you questions about your: ?Alcohol use. ?Tobacco use. ?Drug use. ?Emotional well-being. ?Home and relationship  well-being. ?Sexual activity. ?Eating habits. ?History of falls. ?Memory and ability to understand (cognition). ?Work and work Statistician. ?Reproductive health. ?Screening  ?You may have the following tests or measurements: ?Height, weight, and BMI. ?Blood pressure. ?Lipid and cholesterol levels. These may be checked every 5 years, or more frequently if you are over 25 years old. ?Skin check. ?Lung cancer screening. You may have this screening every year starting at age 21 if you have a 30-pack-year history of smoking and currently smoke or have quit within the past 15 years. ?Fecal occult blood test (FOBT) of the stool. You may have this test every year starting at age 90. ?Flexible sigmoidoscopy or colonoscopy. You may have a sigmoidoscopy every 5 years or a colonoscopy every 10 years starting at age 18. ?Hepatitis C blood test. ?Hepatitis B blood test. ?Sexually transmitted disease (STD) testing. ?Diabetes screening. This is done by checking your blood sugar (glucose) after you have not eaten for a while (fasting). You may have this done every 1-3 years. ?Bone density scan. This is done to screen for osteoporosis. You may have this done starting at age 52. ?Mammogram. This may be done every 1-2 years. Talk to your health care provider about how often you should have regular mammograms. ?Talk with your health care provider about your test results, treatment options, and if necessary, the need for more tests. ?Vaccines  ?Your health care provider may recommend certain vaccines, such as: ?Influenza vaccine. This is recommended every year. ?Tetanus, diphtheria, and acellular pertussis (Tdap, Td) vaccine. You may need a Td booster every 10 years. ?Zoster vaccine. You may need this after age 7. ?Pneumococcal 13-valent conjugate (PCV13) vaccine. One dose is recommended after age 42. ?Pneumococcal polysaccharide (PPSV23) vaccine. One dose is recommended after age 64. ?Talk to your health  care provider about which  screenings and vaccines you need and how often you need them. ?This information is not intended to replace advice given to you by your health care provider. Make sure you discuss any questions you have with your health care provider. ?Document Released: 03/03/2015 Document Revised: 10/25/2015 Document Reviewed: 12/06/2014 ?Elsevier Interactive Patient Education ? 2017 [place]. ? ?Fall Prevention in the Home ?Falls can cause injuries. They can happen to people of all ages. There are many things you can do to make your home safe and to help prevent falls. ?What can I do on the outside of my home? ?Regularly fix the edges of walkways and driveways and fix any cracks. ?Remove anything that might make you trip as you walk through a door, such as a raised step or threshold. ?Trim any bushes or trees on the path to your home. ?Use bright outdoor lighting. ?Clear any walking paths of anything that might make someone trip, such as rocks or tools. ?Regularly check to see if handrails are loose or broken. Make sure that both sides of any steps have handrails. ?Any raised decks and porches should have guardrails on the edges. ?Have any leaves, snow, or ice cleared regularly. ?Use sand or salt on walking paths during winter. ?Clean up any spills in your garage right away. This includes oil or grease spills. ?What can I do in the bathroom? ?Use night lights. ?Install grab bars by the toilet and in the tub and shower. Do not use towel bars as grab bars. ?Use non-skid mats or decals in the tub or shower. ?If you need to sit down in the shower, use a plastic, non-slip stool. ?Keep the floor dry. Clean up any water that spills on the floor as soon as it happens. ?Remove soap buildup in the tub or shower regularly. ?Attach bath mats securely with double-sided non-slip rug tape. ?Do not have throw rugs and other things on the floor that can make you trip. ?What can I do in the bedroom? ?Use night lights. ?Make sure that you have a  light by your bed that is easy to reach. ?Do not use any sheets or blankets that are too big for your bed. They should not hang down onto the floor. ?Have a firm chair that has side arms. You can use this for support while you get dressed. ?Do not have throw rugs and other things on the floor that can make you trip. ?What can I do in the kitchen? ?Clean up any spills right away. ?Avoid walking on wet floors. ?Keep items that you use a lot in easy-to-reach places. ?If you need to reach something above you, use a strong step stool that has a grab bar. ?Keep electrical cords out of the way. ?Do not use floor polish or wax that makes floors slippery. If you must use wax, use non-skid floor wax. ?Do not have throw rugs and other things on the floor that can make you trip. ?What can I do with my stairs? ?Do not leave any items on the stairs. ?Make sure that there are handrails on both sides of the stairs and use them. Fix handrails that are broken or loose. Make sure that handrails are as long as the stairways. ?Check any carpeting to make sure that it is firmly attached to the stairs. Fix any carpet that is loose or worn. ?Avoid having throw rugs at the top or bottom of the stairs. If you do have throw rugs, attach them to  the floor with carpet tape. ?Make sure that you have a light switch at the top of the stairs and the bottom of the stairs. If you do not have them, ask someone to add them for you. ?What else can I do to help prevent falls? ?Wear shoes that: ?Do not have high heels. ?Have rubber bottoms. ?Are comfortable and fit you well. ?Are closed at the toe. Do not wear sandals. ?If you use a stepladder: ?Make sure that it is fully opened. Do not climb a closed stepladder. ?Make sure that both sides of the stepladder are locked into place. ?Ask someone to hold it for you, if possible. ?Clearly mark and make sure that you can see: ?Any grab bars or handrails. ?First and last steps. ?Where the edge of each step  is. ?Use tools that help you move around (mobility aids) if they are needed. These include: ?Canes. ?Walkers. ?Scooters. ?Crutches. ?Turn on the lights when you go into a dark area. Replace any light bulbs as soon as

## 2021-06-15 ENCOUNTER — Telehealth (INDEPENDENT_AMBULATORY_CARE_PROVIDER_SITE_OTHER): Payer: Medicare Other | Admitting: Family Medicine

## 2021-06-15 DIAGNOSIS — R051 Acute cough: Secondary | ICD-10-CM

## 2021-06-15 DIAGNOSIS — J069 Acute upper respiratory infection, unspecified: Secondary | ICD-10-CM

## 2021-06-15 MED ORDER — BENZONATATE 200 MG PO CAPS: 200.0000 mg | ORAL_CAPSULE | Freq: Two times a day (BID) | ORAL | 0 refills | Status: DC | PRN

## 2021-06-15 MED ORDER — DOXYCYCLINE HYCLATE 100 MG PO TABS: 100.0000 mg | ORAL_TABLET | Freq: Two times a day (BID) | ORAL | 0 refills | Status: AC

## 2021-06-15 NOTE — Telephone Encounter (Signed)
Appt today

## 2021-06-15 NOTE — Progress Notes (Signed)
MyChart Video Visit    Virtual Visit via Video Note   This visit type was conducted due to national recommendations for restrictions regarding the COVID-19 Pandemic (e.g. social distancing) in an effort to limit this patient's exposure and mitigate transmission in our community. This patient is at least at moderate risk for complications without adequate follow up. This format is felt to be most appropriate for this patient at this time. Physical exam was limited by quality of the video and audio technology used for the visit.   Patient location: home Provider location: [facility]  I discussed the limitations of evaluation and management by telemedicine and the availability of in person appointments. The patient expressed understanding and agreed to proceed.  Patient: [name]   DOB: 1945-11-05   76 y.o. Female  MRN: [id number] Visit Date: 06/15/2021  Today's healthcare provider: Lelon Huh, MD   Chief Complaint  Patient presents with   Cough   Subjective    Cough This is a new problem. Episode onset: 3 weeks ago. The problem has been gradually improving. The cough is Productive of sputum (light green). Pertinent negatives include no chest pain, chills, fever or shortness of breath.   She was prescribed Augmentin for sinusitis on 4/10. She started coughing up blood acoupePatient was seen at [facility] for cough and wheezing. She was prescribed a Amo xicillin and Albuterol inhaler. Patient has been breaking Amoxicillin tablet in half due to difficulty swallowing the pill size and intolerance when taking a whole tablet.  She was prescribed azithromycin and an  which completed she completed  She states that prednisone made her crazy in the past.    Medications: Outpatient Medications Prior to Visit  Medication Sig   Acetaminophen-Codeine 300-30 MG tablet acetaminophen 300 mg-codeine 30 mg tablet  Take 1 tablet every 6 hours by oral route for 5 days. (Patient not  taking: Reported on 06/14/2021)   albuterol (VENTOLIN HFA) 108 (90 Base) MCG/ACT inhaler TAKE 2 PUFFS BY MOUTH EVERY 6 HOURS AS NEEDED FOR WHEEZE OR SHORTNESS OF BREATH   albuterol (VENTOLIN HFA) 108 (90 Base) MCG/ACT inhaler Inhale into the lungs.   ALPRAZolam (XANAX) 0.5 MG tablet TAKE 1 TABLET (0.5 MG TOTAL) BY MOUTH 3 (THREE) TIMES DAILY AS NEEDED FOR ANXIETY OR SLEEP.   aspirin EC 81 MG tablet Take 81 mg by mouth daily.   citalopram (CELEXA) 10 MG tablet TAKE 1 TABLET BY MOUTH EVERY DAY   citalopram (CELEXA) 20 MG tablet Take 1 tablet (20 mg total) by mouth daily.   clindamycin (CLEOCIN) 150 MG capsule Take 150 mg by mouth 3 (three) times daily.   clotrimazole (LOTRIMIN) 1 % cream Apply 1 application topically 2 (two) times daily.   fluconazole (DIFLUCAN) 150 MG tablet Take 150 mg by mouth once.   fluticasone (FLONASE) 50 MCG/ACT nasal spray PLACE 1-2 SPRAYS INTO BOTH NOSTRILS DAILY AS NEEDED FOR ALLERGIES OR RHINITIS.   ibuprofen (ADVIL) 800 MG tablet TAKE 1 TABLET (800 MG TOTAL) BY MOUTH DAILY AS NEEDED (SEVERE PAIN).   omeprazole (PRILOSEC) 20 MG capsule Take 20 mg by mouth daily as needed (acid reflux).    rosuvastatin (CRESTOR) 40 MG tablet Take 1 tablet (40 mg total) by mouth daily.   Spacer/Aero-Holding Chambers (EASIVENT) inhaler Use as needed with inhaler   Spacer/Aero-Holding Chambers (EQ SPACE CHAMBER ANTI-STATIC) DEVI Inhale into the lungs as needed.   [DISCONTINUED] azithromycin (ZITHROMAX) 250 MG tablet Take by mouth.   No facility-administered  medications prior to visit.    Review of Systems  Constitutional:  Positive for fatigue. Negative for appetite change, chills and fever.  Respiratory:  Positive for cough. Negative for chest tightness and shortness of breath.   Cardiovascular:  Negative for chest pain and palpitations.  Gastrointestinal:  Negative for abdominal pain, nausea and vomiting.  Neurological:  Negative for dizziness and weakness.      Objective     There were no vitals taken for this visit.     Physical Exam   Awake, alert, oriented x 3. In no apparent distress   Assessment & Plan     1. Acute cough  - benzonatate (TESSALON) 200 MG capsule; Take 1 capsule (200 mg total) by mouth 2 (two) times daily as needed for cough.  Dispense: 20 capsule; Refill: 0  2. Upper respiratory tract infection, unspecified type  - doxycycline (VIBRA-TABS) 100 MG tablet; Take 1 tablet (100 mg total) by mouth 2 (two) times daily for 7 days.  Dispense: 14 tablet; Refill: 0   Call if symptoms change or if not rapidly improving.        I discussed the assessment and treatment plan with the patient. The patient was provided an opportunity to ask questions and all were answered. The patient agreed with the plan and demonstrated an understanding of the instructions.   The patient was advised to call back or seek an in-person evaluation if the symptoms worsen or if the condition fails to improve as anticipated.  I provided 9 minutes of non-face-to-face time during this encounter.  The entirety of the information documented in the History of Present Illness, Review of Systems and Physical Exam were personally obtained by me. Portions of this information were initially documented by the CMA and reviewed by me for thoroughness and accuracy.    Lelon Huh, MD [facility] [phone] (phone) [phone] (fax)  [place]

## 2021-06-17 DIAGNOSIS — F329 Major depressive disorder, single episode, unspecified: Secondary | ICD-10-CM

## 2021-06-19 ENCOUNTER — Ambulatory Visit: Payer: Medicare Other | Admitting: Family Medicine

## 2021-06-26 DIAGNOSIS — K439 Ventral hernia without obstruction or gangrene: Secondary | ICD-10-CM | POA: Diagnosis not present

## 2021-06-28 ENCOUNTER — Ambulatory Visit (INDEPENDENT_AMBULATORY_CARE_PROVIDER_SITE_OTHER): Payer: Medicare Other | Admitting: *Deleted

## 2021-06-28 DIAGNOSIS — F419 Anxiety disorder, unspecified: Secondary | ICD-10-CM

## 2021-06-28 DIAGNOSIS — F329 Major depressive disorder, single episode, unspecified: Secondary | ICD-10-CM

## 2021-06-28 NOTE — Patient Instructions (Signed)
Visit Information ? ?Thank you for taking time to visit with me today. Please don't hesitate to contact me if I can be of assistance to you before our next scheduled telephone appointment. ? ?Following are the goals we discussed today:  ? ?- talk about feelings with a friend, family or spiritual advisor ?- practice positive thinking and self-talk  ?-continue to prioritize self care and the establishing of boundaries ?-follow up with medical provider when needed ?-continue to consider ongoing personal counseling ? ? ?Our next appointment is by telephone on 07/12/21 at 11am ? ?Please call the care guide team at [phone] if you need to cancel or reschedule your appointment.  ? ?If you are experiencing a Mental Health or [place] or need someone to talk to, please call the Suicide and Crisis Lifeline: 988  ? ?Patient verbalizes understanding of instructions and care plan provided today and agrees to view in [place]. Active MyChart status confirmed with patient.   ? ?Telephone follow up appointment with care management team member scheduled for: 07/12/21 ? ? ?Jacarra Bobak, LCSW ?Clinical Social Worker  ?[facility] ?[phone] ? ?

## 2021-06-28 NOTE — Chronic Care Management (AMB) (Signed)
?Chronic Care Management  ? ? Clinical Social Work Note ? ?06/28/2021 ?Name: [name] MRN: [id number] DOB: October 21, 1945 ? ?[name] is a 76 y.o. year old female who is a primary care patient of [name], [name], MD. The CCM team was consulted to assist the patient with chronic disease management and/or care coordination needs related to: Mental Health Counseling and Resources.  ? ?Engaged with patient by telephone for follow up visit in response to provider referral for social work chronic care management and care coordination services.  ? ?Consent to Services:  ?The patient was given information about Chronic Care Management services, agreed to services, and gave verbal consent prior to initiation of services.  Please see initial visit note for detailed documentation.  ? ?Patient agreed to services and consent obtained.  ? ?Assessment: Review of patient past medical history, allergies, medications, and health status, including review of relevant consultants reports was performed today as part of a comprehensive evaluation and provision of chronic care management and care coordination services.    ? ?SDOH (Social Determinants of Health) assessments and interventions performed:   ? ?Advanced Directives Status: Not addressed in this encounter. ? ?CCM Care Plan ? ?Allergies  ?Allergen Reactions  ? Levofloxacin   ?  Tongue and mouth swelling  ? Calcium Channel Blockers   ?  Other reaction(s): Dizziness  ? Amoxicillin Other (See Comments)  ?  Causes yeast infection ?Has patient had a PCN reaction causing immediate rash, facial/tongue/throat swelling, SOB or lightheadedness with hypotension: No ?Has patient had a PCN reaction causing severe rash involving mucus membranes or skin necrosis: No ?Has patient had a PCN reaction that required hospitalization: No ?Has patient had a PCN reaction occurring within the last 10 years: No ?If all of the above answers are "NO", then may proceed with Cephalosporin use. ?  ?  Nickel Rash  ? Penicillins Other (See Comments)  ?  Causes yeast infection ?Has patient had a PCN reaction causing immediate rash, facial/tongue/throat swelling, SOB or lightheadedness with hypotension: No ?Has patient had a PCN reaction causing severe rash involving mucus membranes or skin necrosis: No ?Has patient had a PCN reaction that required hospitalization: No ?Has patient had a PCN reaction occurring within the last 10 years: No ?If all of the above answers are "NO", then may proceed with Cephalosporin use.  ? ? ?Outpatient Encounter Medications as of 06/28/2021  ?Medication Sig  ? Acetaminophen-Codeine 300-30 MG tablet acetaminophen 300 mg-codeine 30 mg tablet ? Take 1 tablet every 6 hours by oral route for 5 days. (Patient not taking: Reported on 06/14/2021)  ? albuterol (VENTOLIN HFA) 108 (90 Base) MCG/ACT inhaler TAKE 2 PUFFS BY MOUTH EVERY 6 HOURS AS NEEDED FOR WHEEZE OR SHORTNESS OF BREATH  ? albuterol (VENTOLIN HFA) 108 (90 Base) MCG/ACT inhaler Inhale into the lungs.  ? ALPRAZolam (XANAX) 0.5 MG tablet TAKE 1 TABLET (0.5 MG TOTAL) BY MOUTH 3 (THREE) TIMES DAILY AS NEEDED FOR ANXIETY OR SLEEP.  ? aspirin EC 81 MG tablet Take 81 mg by mouth daily.  ? benzonatate (TESSALON) 200 MG capsule Take 1 capsule (200 mg total) by mouth 2 (two) times daily as needed for cough.  ? citalopram (CELEXA) 10 MG tablet TAKE 1 TABLET BY MOUTH EVERY DAY  ? citalopram (CELEXA) 20 MG tablet Take 1 tablet (20 mg total) by mouth daily.  ? clindamycin (CLEOCIN) 150 MG capsule Take 150 mg by mouth 3 (three) times daily.  ? clotrimazole (LOTRIMIN) 1 % cream  Apply 1 application topically 2 (two) times daily.  ? fluconazole (DIFLUCAN) 150 MG tablet Take 150 mg by mouth once.  ? fluticasone (FLONASE) 50 MCG/ACT nasal spray PLACE 1-2 SPRAYS INTO BOTH NOSTRILS DAILY AS NEEDED FOR ALLERGIES OR RHINITIS.  ? ibuprofen (ADVIL) 800 MG tablet TAKE 1 TABLET (800 MG TOTAL) BY MOUTH DAILY AS NEEDED (SEVERE PAIN).  ? omeprazole (PRILOSEC) 20 MG  capsule Take 20 mg by mouth daily as needed (acid reflux).   ? rosuvastatin (CRESTOR) 40 MG tablet Take 1 tablet (40 mg total) by mouth daily.  ? Spacer/Aero-Holding Chambers (EASIVENT) inhaler Use as needed with inhaler  ? Spacer/Aero-Holding Chambers (EQ SPACE CHAMBER ANTI-STATIC) DEVI Inhale into the lungs as needed.  ? ?No facility-administered encounter medications on file as of 06/28/2021.  ? ? ?Patient Active Problem List  ? Diagnosis Date Noted  ? Closed fracture of lateral malleolus of right fibula 10/11/2020  ? 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 Palpitations 04/14/2020  ? Pure hypercholesterolemia 04/14/2020  ? Cardiac syncope 04/04/2020  ? Small bowel obstruction (HCC)   ? Partial small bowel obstruction (HCC) 02/04/2018  ? SBO (small bowel obstruction) (HCC) 03/13/2017  ? Panic disorder 02/21/2016  ? Weakness of both lower extremities 02/21/2016  ? Depression 02/21/2016  ? Problems with swallowing and mastication   ? Stricture and stenosis of esophagus   ? 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 Acid reflux 10/20/2014  ? Hx of colonic polyps   ? Benign neoplasm of transverse colon   ? Diverticulosis of large intestine without diverticulitis   ? Allergic rhinitis 08/01/2014  ? Anxiety 08/01/2014  ? HLD (hyperlipidemia) 08/01/2014  ? Essential hypertension 08/01/2014  ? Vitamin D deficiency 08/01/2014  ? H/O adenomatous polyp of colon 01/22/2012  ? ? ?Conditions to be addressed/monitored: Depression; Mental Health Concerns  ? ?Care Plan : Depression (Adult)  ?Updates made by Wenda Overland, LCSW since 06/28/2021 12:00 AM  ?  ? ?Problem: Depression Identification (Depression)   ?  ? ?Long-Range Goal: Depressive Symptoms Identified   ?Start Date: 02/16/2020  ?Expected End Date: 01/17/2021  ?Recent Progress: On track  ?Priority: High  ?Note:   ?Current Barriers:  ?Mental Health Concerns  ?Depression ? ?Clinical Social Work Clinical Goal(s):  ?Over the next 90 days, patient will work with SW to address concerns related  to identifying coping skills to manager her depression as well as connection her to a local mental health provider for ongoing mental health follow up  ? ?Interventions: ?1:1 collaboration with Malva Limes, MD regarding development and update of comprehensive plan of care as evidenced by provider attestation and co-signature ?Inter-disciplinary care team collaboration (see longitudinal plan of care) ?Continued to be provided with emotional support related to mental health and medical concerns ?Patient continues to report depressed mood, lack of motivation and crying spells ?Patient's anxiety symptoms exacerbated by current medical and relationship challenges ?Patient provided with emotional support  and encouragement to continue to utilize coping recommendations discussed ? ?  ? ? ?Patient Goals/Self-Care Activities ?Over the next 90 days, patient will:  ? - Patient will attend all scheduled provider appointments ?Patient will attend church or other social activities ?- consider beginning personal counseling ?- talk about feelings with a friend, family or spiritual advisor ?- practice positive thinking and self-talk ? ? ?Follow up Plan: SW will follow up with patient by phone over the next 30 business days ? ? ?  ?  ? ?Follow Up Plan: SW will follow up with patient  by phone over the next 14 business days ?     ? ?Annelyse Rey, LCSW ?Clinical Social Worker  ?[facility] ?[phone] ? ? ? ?

## 2021-07-02 NOTE — Progress Notes (Signed)
I,Alezander Dimaano Robinson,acting as a Neurosurgeon for [employer], PA-C.,have documented all relevant documentation on the behalf of Debera Lat, PA-C,as directed by  [employer], PA-C while in the presence of [employer], PA-C.   Established patient visit   Patient: [name]   DOB: [birth year]   76 y.o. Female  MRN: [id number] Visit Date: 07/03/2021  Today's healthcare provider: Debera Lat, PA-C   No chief complaint on file. CC: dry mouth, frequent urination, weight gain  Subjective    Patient presents dry mouth, and weight gain. The dry mouth is her primacy concern today.  Comes and goes.  Patient has been taking a course of medications for acute sinus infection. Could be due to taking new meds. Reports she drinks water all day everyday. Worries that she might had DM. Per chart review, her last A1C was 6/1 in 10/2020. Patient assumes weight gain is from getting older and not as active.   Patient is not sure if she should be on Rosuvastatin. She had her last physical some time ago.   From September of 2022 till now she gained 30 pounds.  Does not exercise. Did stress-eating last year and might gain so much weight at that time.  Medications: Outpatient Medications Prior to Visit  Medication Sig   albuterol (VENTOLIN HFA) 108 (90 Base) MCG/ACT inhaler TAKE 2 PUFFS BY MOUTH EVERY 6 HOURS AS NEEDED FOR WHEEZE OR SHORTNESS OF BREATH   albuterol (VENTOLIN HFA) 108 (90 Base) MCG/ACT inhaler Inhale into the lungs.   ALPRAZolam (XANAX) 0.5 MG tablet TAKE 1 TABLET (0.5 MG TOTAL) BY MOUTH 3 (THREE) TIMES DAILY AS NEEDED FOR ANXIETY OR SLEEP.   aspirin EC 81 MG tablet Take 81 mg by mouth daily.   benzonatate (TESSALON) 200 MG capsule Take 1 capsule (200 mg total) by mouth 2 (two) times daily as needed for cough.   citalopram (CELEXA) 10 MG tablet TAKE 1 TABLET BY MOUTH EVERY DAY   citalopram (CELEXA) 20 MG tablet Take 1 tablet (20 mg total) by mouth daily.   clindamycin (CLEOCIN) 150 MG  capsule Take 150 mg by mouth 3 (three) times daily.   clotrimazole (LOTRIMIN) 1 % cream Apply 1 application topically 2 (two) times daily.   fluconazole (DIFLUCAN) 150 MG tablet Take 150 mg by mouth once.   fluticasone (FLONASE) 50 MCG/ACT nasal spray PLACE 1-2 SPRAYS INTO BOTH NOSTRILS DAILY AS NEEDED FOR ALLERGIES OR RHINITIS.   ibuprofen (ADVIL) 800 MG tablet TAKE 1 TABLET (800 MG TOTAL) BY MOUTH DAILY AS NEEDED (SEVERE PAIN).   omeprazole (PRILOSEC) 20 MG capsule Take 20 mg by mouth daily as needed (acid reflux).    Spacer/Aero-Holding Chambers (EASIVENT) inhaler Use as needed with inhaler   Spacer/Aero-Holding Chambers (EQ SPACE CHAMBER ANTI-STATIC) DEVI Inhale into the lungs as needed.   Acetaminophen-Codeine 300-30 MG tablet    rosuvastatin (CRESTOR) 40 MG tablet Take 1 tablet (40 mg total) by mouth daily.   No facility-administered medications prior to visit.    Review of Systems  Respiratory: Negative.    Cardiovascular: Negative.   Gastrointestinal: Negative.       Objective    BP 135/87 (BP Location: Left Arm, Patient Position: Sitting, Cuff Size: Normal)   Pulse 75   Temp 97.8 F (36.6 C) (Oral)   Resp 16   Wt 184 lb 4.8 oz (83.6 kg)   SpO2 100%   BMI 32.65 kg/m    Physical Exam Vitals reviewed.  Constitutional:  Appearance: Normal appearance. She is obese.  HENT:     Head: Normocephalic and atraumatic.  Eyes:     Extraocular Movements: Extraocular movements intact.     Pupils: Pupils are equal, round, and reactive to light.  Cardiovascular:     Rate and Rhythm: Normal rate and regular rhythm.     Pulses: Normal pulses.     Heart sounds: Normal heart sounds.  Pulmonary:     Effort: Pulmonary effort is normal.     Breath sounds: Normal breath sounds.  Abdominal:     General: Abdomen is flat. Bowel sounds are normal.     Palpations: Abdomen is soft.  Neurological:     Mental Status: She is alert.     No results found for any visits on 07/03/21.   Assessment & Plan     1. Dry mouth -could be due to abx or cough suppressant Clotrimazole troches Continue to drink a lot of fluids   2. Prediabetes A1C 5.8 Lifestyle modifications: healthy diet and daily walking/exercise were encouraged  3. Obesity (BMI 30.0-34.9) BMI 32.65 Lifestyle modifications: healthy diet and daily walking/exercise were encouraged   She is due her physical. Her annual wellness was done on 06/14/21 Will schedule Physical with Dr Sherrie Mustache   The patient was advised to call back or seek an in-person evaluation if the symptoms worsen or if the condition fails to improve as anticipated.  I discussed the assessment and treatment plan with the patient. The patient was provided an opportunity to ask questions and all were answered. The patient agreed with the plan and demonstrated an understanding of the instructions.  The entirety of the information documented in the History of Present Illness, Review of Systems and Physical Exam were personally obtained by me. Portions of this information were initially documented by the CMA and reviewed by me for thoroughness and accuracy.  Portions of this note were created using dictation software and may contain typographical errors.   Debera Lat, PA-C  [facility] [phone] (phone) [phone] (fax)  [facility]

## 2021-07-03 ENCOUNTER — Encounter: Payer: Self-pay | Admitting: Physician Assistant

## 2021-07-03 ENCOUNTER — Ambulatory Visit (INDEPENDENT_AMBULATORY_CARE_PROVIDER_SITE_OTHER): Payer: Medicare Other | Admitting: Physician Assistant

## 2021-07-03 ENCOUNTER — Other Ambulatory Visit: Payer: Self-pay | Admitting: Family Medicine

## 2021-07-03 VITALS — BP 135/87 | HR 75 | Temp 97.8°F | Resp 16 | Wt 184.3 lb

## 2021-07-03 DIAGNOSIS — R682 Dry mouth, unspecified: Secondary | ICD-10-CM

## 2021-07-03 DIAGNOSIS — E669 Obesity, unspecified: Secondary | ICD-10-CM | POA: Diagnosis not present

## 2021-07-03 DIAGNOSIS — R7303 Prediabetes: Secondary | ICD-10-CM | POA: Diagnosis not present

## 2021-07-03 LAB — POCT GLYCOSYLATED HEMOGLOBIN (HGB A1C): Hemoglobin A1C: 5.8 % — AB (ref 4.0–5.6)

## 2021-07-03 MED ORDER — CLOTRIMAZOLE 10 MG MT TROC: 10.0000 mg | Freq: Every day | OROMUCOSAL | 0 refills | Status: DC

## 2021-07-05 DIAGNOSIS — E669 Obesity, unspecified: Secondary | ICD-10-CM | POA: Insufficient documentation

## 2021-07-05 DIAGNOSIS — R7303 Prediabetes: Secondary | ICD-10-CM | POA: Insufficient documentation

## 2021-07-12 ENCOUNTER — Ambulatory Visit: Payer: Medicare Other | Admitting: *Deleted

## 2021-07-12 ENCOUNTER — Telehealth: Payer: Medicare Other

## 2021-07-12 DIAGNOSIS — F419 Anxiety disorder, unspecified: Secondary | ICD-10-CM

## 2021-07-12 DIAGNOSIS — F329 Major depressive disorder, single episode, unspecified: Secondary | ICD-10-CM

## 2021-07-12 NOTE — Patient Instructions (Signed)
Visit Information  Thank you for taking time to visit with me today. Please don't hesitate to contact me if I can be of assistance to you before our next scheduled telephone appointment.  Following are the goals we discussed today:   - talk about feelings with a friend, family or spiritual advisor - practice positive thinking and self-talk  -continue to prioritize self care and the establishing of boundaries -follow up with medical provider when needed -continue to consider ongoing personal counseling  Our next appointment is by telephone on 07/26/21 at 10am  Please call the care guide team at [phone] if you need to cancel or reschedule your appointment.   If you are experiencing a Mental Health or [place] or need someone to talk to, please call the Suicide and Crisis Lifeline: 988   Patient verbalizes understanding of instructions and care plan provided today and agrees to view in [place]. Active MyChart status and patient understanding of how to access instructions and care plan via MyChart confirmed with patient.     Telephone follow up appointment with care management team member scheduled for: 07/26/21   Elliot Gurney, [place] Worker  [facility] [phone]

## 2021-07-12 NOTE — Chronic Care Management (AMB) (Signed)
Chronic Care Management    Clinical Social Work Note  07/12/2021 Name: [name] MRN: [id number] DOB: [birth year]  [name] is a 76 y.o. year old female who is a primary care patient of [name], [name], MD. The CCM team was consulted to assist the patient with chronic disease management and/or care coordination needs related to: Mental Health Counseling and Resources.   Engaged with patient by telephone for follow up visit in response to provider referral for social work chronic care management and care coordination services.   Consent to Services:  The patient was given information about Chronic Care Management services, agreed to services, and gave verbal consent prior to initiation of services.  Please see initial visit note for detailed documentation.   Patient agreed to services and consent obtained.   Assessment: Review of patient past medical history, allergies, medications, and health status, including review of relevant consultants reports was performed today as part of a comprehensive evaluation and provision of chronic care management and care coordination services.     SDOH (Social Determinants of Health) assessments and interventions performed:    Advanced Directives Status: Not addressed in this encounter.  CCM Care Plan  Allergies  Allergen Reactions   Levofloxacin     Tongue and mouth swelling   Calcium Channel Blockers     Other reaction(s): Dizziness   Amoxicillin Other (See Comments)    Causes yeast infection Has patient had a PCN reaction causing immediate rash, facial/tongue/throat swelling, SOB or lightheadedness with hypotension: No Has patient had a PCN reaction causing severe rash involving mucus membranes or skin necrosis: No Has patient had a PCN reaction that required hospitalization: No Has patient had a PCN reaction occurring within the last 10 years: No If all of the above answers are "NO", then may proceed with Cephalosporin use.     Nickel Rash   Penicillins Other (See Comments)    Causes yeast infection Has patient had a PCN reaction causing immediate rash, facial/tongue/throat swelling, SOB or lightheadedness with hypotension: No Has patient had a PCN reaction causing severe rash involving mucus membranes or skin necrosis: No Has patient had a PCN reaction that required hospitalization: No Has patient had a PCN reaction occurring within the last 10 years: No If all of the above answers are "NO", then may proceed with Cephalosporin use.    Outpatient Encounter Medications as of 07/12/2021  Medication Sig   Acetaminophen-Codeine 300-30 MG tablet    albuterol (VENTOLIN HFA) 108 (90 Base) MCG/ACT inhaler TAKE 2 PUFFS BY MOUTH EVERY 6 HOURS AS NEEDED FOR WHEEZE OR SHORTNESS OF BREATH   albuterol (VENTOLIN HFA) 108 (90 Base) MCG/ACT inhaler Inhale into the lungs.   ALPRAZolam (XANAX) 0.5 MG tablet TAKE 1 TABLET (0.5 MG TOTAL) BY MOUTH 3 (THREE) TIMES DAILY AS NEEDED FOR ANXIETY OR SLEEP.   aspirin EC 81 MG tablet Take 81 mg by mouth daily.   benzonatate (TESSALON) 200 MG capsule Take 1 capsule (200 mg total) by mouth 2 (two) times daily as needed for cough.   citalopram (CELEXA) 10 MG tablet Take 1 tablet (10 mg total) by mouth daily.   clindamycin (CLEOCIN) 150 MG capsule Take 150 mg by mouth 3 (three) times daily.   clotrimazole (LOTRIMIN) 1 % cream Apply 1 application topically 2 (two) times daily.   clotrimazole (MYCELEX) 10 MG troche Take 1 tablet (10 mg total) by mouth 5 (five) times daily.   fluconazole (DIFLUCAN) 150 MG tablet Take 150 mg by  mouth once.   fluticasone (FLONASE) 50 MCG/ACT nasal spray PLACE 1-2 SPRAYS INTO BOTH NOSTRILS DAILY AS NEEDED FOR ALLERGIES OR RHINITIS.   ibuprofen (ADVIL) 800 MG tablet TAKE 1 TABLET (800 MG TOTAL) BY MOUTH DAILY AS NEEDED (SEVERE PAIN).   omeprazole (PRILOSEC) 20 MG capsule Take 20 mg by mouth daily as needed (acid reflux).    rosuvastatin (CRESTOR) 40 MG tablet Take 1  tablet (40 mg total) by mouth daily.   Spacer/Aero-Holding Chambers (EASIVENT) inhaler Use as needed with inhaler   Spacer/Aero-Holding Chambers (EQ SPACE CHAMBER ANTI-STATIC) DEVI Inhale into the lungs as needed.   No facility-administered encounter medications on file as of 07/12/2021.    Patient Active Problem List   Diagnosis Date Noted   Prediabetes 07/05/2021   Obesity (BMI 30.0-34.9) 07/05/2021   Closed fracture of lateral malleolus of right fibula 10/11/2020   Recurrent syncope 04/14/2020   Palpitations 04/14/2020   Pure hypercholesterolemia 04/14/2020   Cardiac syncope 04/04/2020   Small bowel obstruction (HCC)    Partial small bowel obstruction ([place]) 02/04/2018   SBO (small bowel obstruction) ([place]) 03/13/2017   Panic disorder 02/21/2016   Weakness of both lower extremities 02/21/2016   Depression 02/21/2016   Problems with swallowing and mastication    Stricture and stenosis of esophagus    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   Acid reflux 10/20/2014   Hx of colonic polyps    Benign neoplasm of transverse colon    Diverticulosis of large intestine without diverticulitis    Allergic rhinitis 08/01/2014   Anxiety 08/01/2014   HLD (hyperlipidemia) 08/01/2014   Essential hypertension 08/01/2014   Vitamin D deficiency 08/01/2014   H/O adenomatous polyp of colon 01/22/2012    Conditions to be addressed/monitored: Anxiety and Depression; Mental Health Concerns   Care Plan : Depression (Adult)  Updates made by Vern Claude, LCSW since 07/12/2021 12:00 AM     Problem: Depression Identification (Depression)      Long-Range Goal: Depressive Symptoms Identified   Start Date: 02/16/2020  Expected End Date: 01/17/2021  Recent Progress: On track  Priority: High  Note:   Current Barriers:  Mental Health Concerns  Depression  Clinical Social Work Clinical Goal(s):  Over the next 90 days, patient will work with SW to address concerns related to identifying coping skills to  manager her depression as well as connection her to a local mental health provider for ongoing mental health follow up   Interventions: 1:1 collaboration with [name], [name], MD regarding development and update of comprehensive plan of care as evidenced by provider attestation and co-signature Inter-disciplinary care team collaboration (see longitudinal plan of care) Continued to be provided with emotional support related to mental health and medical concerns Patient discussed concern regarding hospitalization of family member  Patient continues to report depressed mood, increased anxiety however discussed plan to push forward and not retreat to non-productive activities  Patient's anxiety symptoms exacerbated by current medical and relationship challenges Patient discussed ability to reach out to family and friends for support-changing her perspective, establishing boundaries discussed Verbalization of feelings encouraged, patient provided with emotional support  and encouragement to continue to utilize coping recommendations discussed      Patient Goals/Self-Care Activities Over the next 90 days, patient will:   - Patient will attend all scheduled provider appointments Patient will attend church or other social activities - consider beginning personal counseling - talk about feelings with a friend, family or spiritual advisor - practice positive thinking and self-talk   Follow  up Plan: SW will follow up with patient by phone over the next 30 business days        Follow Up Plan: SW will follow up with patient by phone over the next 14 business days       [place], [place] Worker  [facility] [phone]

## 2021-07-18 DIAGNOSIS — F32A Depression, unspecified: Secondary | ICD-10-CM

## 2021-07-26 ENCOUNTER — Telehealth: Payer: Medicare Other

## 2021-07-31 ENCOUNTER — Telehealth: Payer: Self-pay | Admitting: *Deleted

## 2021-07-31 ENCOUNTER — Telehealth: Payer: Medicare Other

## 2021-07-31 NOTE — Telephone Encounter (Signed)
  Care Management   Follow Up Note   07/31/2021 Name: KAROLYNE TIMMONS MRN: [id number] DOB: [birth year]   Referred by: Birdie Sons, MD Reason for referral : Care Coordination   Successful contact made, however patient was unable to complete appointment due to a family member being hospitalized.  Follow Up Plan: Telephone follow up appointment with care management team member scheduled for:08/07/21   Elliot Gurney, [place] Worker  [facility] [phone]

## 2021-08-07 ENCOUNTER — Telehealth: Payer: Self-pay | Admitting: *Deleted

## 2021-08-07 ENCOUNTER — Telehealth: Payer: Medicare Other

## 2021-08-07 NOTE — Telephone Encounter (Signed)
  Care Management   Follow Up Note   08/07/2021 Name: [name] MRN: [id number] DOB: 1945/10/14   Referred by: Birdie Sons, MD Reason for referral : Care Coordination   A second unsuccessful telephone outreach was attempted today. The patient was referred to the case management team for assistance with care management and care coordination.   Follow Up Plan: Telephone follow up appointment with care management team member scheduled for: 08/14/21  Elliot Gurney, [place] Worker  [facility] [phone]

## 2021-08-07 NOTE — Telephone Encounter (Signed)
Pt returned call and requested to speak with [place].

## 2021-08-09 ENCOUNTER — Ambulatory Visit (INDEPENDENT_AMBULATORY_CARE_PROVIDER_SITE_OTHER): Payer: Medicare Other | Admitting: *Deleted

## 2021-08-09 DIAGNOSIS — F419 Anxiety disorder, unspecified: Secondary | ICD-10-CM

## 2021-08-09 DIAGNOSIS — F329 Major depressive disorder, single episode, unspecified: Secondary | ICD-10-CM

## 2021-08-09 NOTE — Patient Instructions (Signed)
Visit Information  Thank you for taking time to visit with me today. Please don't hesitate to contact me if I can be of assistance to you before our next scheduled telephone appointment.  Following are the goals we discussed today:  alk about feelings with a friend, family or spiritual advisor - practice positive thinking and self-talk  -continue to prioritize self care and the establishing of boundaries -follow up with medical provider when needed -continue to consider ongoing personal counseling  If you are experiencing a Mental Health or [place] or need someone to talk to, please call the Suicide and Crisis Lifeline: 988   Patient verbalizes understanding of instructions and care plan provided today and agrees to view in [place]. Active MyChart status and patient understanding of how to access instructions and care plan via MyChart confirmed with patient.     No further follow up required: Patient to contact her provider's office with any additional community resource needs   Elliot Gurney, [place] Worker  [facility] [phone]

## 2021-08-14 ENCOUNTER — Telehealth: Payer: Medicare Other

## 2021-08-17 DIAGNOSIS — F329 Major depressive disorder, single episode, unspecified: Secondary | ICD-10-CM

## 2021-10-01 ENCOUNTER — Other Ambulatory Visit: Payer: Self-pay | Admitting: Family Medicine

## 2021-10-01 DIAGNOSIS — F419 Anxiety disorder, unspecified: Secondary | ICD-10-CM

## 2021-10-01 NOTE — Telephone Encounter (Signed)
Requested medication (s) are due for refill today: yes  Requested medication (s) are on the active medication list: yes  Last refill:  04/10/21 #90/3  Future visit scheduled: yes  Notes to clinic:  Unable to refill per protocol, cannot delegate.      Requested Prescriptions  Pending Prescriptions Disp Refills   ALPRAZolam (XANAX) 0.5 MG tablet [Pharmacy Med Name: ALPRAZolam 0.5 MG Oral Tablet] 90 tablet 0    Sig: TAKE 1 TABLET BY MOUTH THREE TIMES DAILY AS NEEDED FOR ANXIETY/ SLEEP     Not Delegated - Psychiatry: Anxiolytics/Hypnotics 2 Failed - 10/01/2021 10:36 AM      Failed - This refill cannot be delegated      Failed - Urine Drug Screen completed in last 360 days      Passed - Patient is not pregnant      Passed - Valid encounter within last 6 months    Recent Outpatient Visits           3 months ago Dry mouth   [facility] [place], [place], PA-C   3 months ago Acute cough   [facility] Birdie Sons, MD   4 months ago Acute frontal sinusitis, recurrence not specified   [facility] Birdie Sons, MD   7 months ago Vaginal itching   [facility] [place] Myles Gip, DO   11 months ago Essential hypertension   [place], [name], MD       Future Appointments             In 2 months [name], [name], MD [facility]

## 2021-10-15 ENCOUNTER — Ambulatory Visit (INDEPENDENT_AMBULATORY_CARE_PROVIDER_SITE_OTHER): Payer: Medicare Other | Admitting: Physician Assistant

## 2021-10-15 VITALS — BP 131/79 | HR 78 | Temp 98.3°F | Wt 184.0 lb

## 2021-10-15 DIAGNOSIS — E559 Vitamin D deficiency, unspecified: Secondary | ICD-10-CM | POA: Diagnosis not present

## 2021-10-15 DIAGNOSIS — E669 Obesity, unspecified: Secondary | ICD-10-CM

## 2021-10-15 DIAGNOSIS — R7303 Prediabetes: Secondary | ICD-10-CM | POA: Diagnosis not present

## 2021-10-15 DIAGNOSIS — R5383 Other fatigue: Secondary | ICD-10-CM | POA: Diagnosis not present

## 2021-10-15 NOTE — Progress Notes (Unsigned)
[place],acting as a Education administrator for [employer], PA-C.,have documented all relevant documentation on the behalf of Mardene Speak, PA-C,as directed by  [employer], PA-C while in the presence of [employer], PA-C.    Established patient visit   Patient: [name]   DOB: 07/10/1945   76 y.o. Female  MRN: [id number] Visit Date: 10/15/2021  Today's healthcare provider: Mardene Speak, PA-C   CC: Vit D deficiency  Subjective    HPI  Patient is a 76 year old female who presents today for evaluation of decreased energy.  She states this has been going on for several months.  She has had low vitamin D level in the past and was put on Vitamin D supplements.  Patient has history of colon surgery and while on the Vitamin D she had to be hospitalized for small bowel obstruction on 09/21/19.  She feels this was due to the Vitamin D dose being too high.  Medications: Outpatient Medications Prior to Visit  Medication Sig   Acetaminophen-Codeine 300-30 MG tablet    albuterol (VENTOLIN HFA) 108 (90 Base) MCG/ACT inhaler TAKE 2 PUFFS BY MOUTH EVERY 6 HOURS AS NEEDED FOR WHEEZE OR SHORTNESS OF BREATH   albuterol (VENTOLIN HFA) 108 (90 Base) MCG/ACT inhaler Inhale into the lungs.   ALPRAZolam (XANAX) 0.5 MG tablet TAKE 1 TABLET BY MOUTH THREE TIMES DAILY AS NEEDED FOR ANXIETY/ SLEEP   aspirin EC 81 MG tablet Take 81 mg by mouth daily.   benzonatate (TESSALON) 200 MG capsule Take 1 capsule (200 mg total) by mouth 2 (two) times daily as needed for cough.   clindamycin (CLEOCIN) 150 MG capsule Take 150 mg by mouth 3 (three) times daily.   clotrimazole (LOTRIMIN) 1 % cream Apply 1 application topically 2 (two) times daily.   clotrimazole (MYCELEX) 10 MG troche Take 1 tablet (10 mg total) by mouth 5 (five) times daily.   fluconazole (DIFLUCAN) 150 MG tablet Take 150 mg by mouth once.   fluticasone (FLONASE) 50 MCG/ACT nasal spray PLACE 1-2 SPRAYS INTO BOTH NOSTRILS DAILY AS NEEDED FOR  ALLERGIES OR RHINITIS.   ibuprofen (ADVIL) 800 MG tablet TAKE 1 TABLET (800 MG TOTAL) BY MOUTH DAILY AS NEEDED (SEVERE PAIN).   omeprazole (PRILOSEC) 20 MG capsule Take 20 mg by mouth daily as needed (acid reflux).    Spacer/Aero-Holding Chambers (EASIVENT) inhaler Use as needed with inhaler   Spacer/Aero-Holding Chambers (EQ SPACE CHAMBER ANTI-STATIC) DEVI Inhale into the lungs as needed.   [DISCONTINUED] citalopram (CELEXA) 10 MG tablet Take 1 tablet (10 mg total) by mouth daily.   rosuvastatin (CRESTOR) 40 MG tablet Take 1 tablet (40 mg total) by mouth daily.   No facility-administered medications prior to visit.    Review of Systems  Constitutional:  Positive for fatigue.  Respiratory:  Negative for shortness of breath.   Cardiovascular:  Negative for chest pain and palpitations.  Neurological:  Negative for dizziness and headaches.   Except see HPI {Labs  Heme  Chem  Endocrine  Serology  Results Review (optional):23779}   Objective    BP 131/79 (BP Location: Right Arm, Patient Position: Sitting, Cuff Size: Normal)   Pulse 78   Temp 98.3 F (36.8 C) (Oral)   Wt 184 lb (83.5 kg)   SpO2 100%   BMI 32.59 kg/m  Vitals:   10/15/21 1448 10/15/21 1449  BP: 138/82 131/79  Pulse: 78   Temp: 98.3 F (36.8 C)   TempSrc: Oral   SpO2:  100%   Weight: 184 lb (83.5 kg)      Physical Exam Vitals reviewed.  Constitutional:      General: She is not in acute distress.    Appearance: Normal appearance. She is well-developed. She is not diaphoretic.  HENT:     Head: Normocephalic and atraumatic.  Eyes:     General: No scleral icterus.    Conjunctiva/sclera: Conjunctivae normal.  Neck:     Thyroid: No thyromegaly.  Cardiovascular:     Rate and Rhythm: Normal rate and regular rhythm.     Pulses: Normal pulses.     Heart sounds: Normal heart sounds. No murmur heard. Pulmonary:     Effort: Pulmonary effort is normal. No respiratory distress.     Breath sounds: Normal breath  sounds. No wheezing, rhonchi or rales.  Musculoskeletal:     Cervical back: Neck supple.     Right lower leg: No edema.     Left lower leg: No edema.  Lymphadenopathy:     Cervical: No cervical adenopathy.  Skin:    General: Skin is warm and dry.     Findings: No rash.  Neurological:     General: No focal deficit present.     Mental Status: She is alert and oriented to person, place, and time. Mental status is at baseline.  Psychiatric:        Behavior: Behavior normal.        Thought Content: Thought content normal.        Judgment: Judgment normal.      No results found for any visits on 10/15/21.  Assessment & Plan     1. Obesity (BMI 30.0-34.9) Workup - Hemoglobin A1c - CBC with Differential/Platelet - Comprehensive metabolic panel - TSH - VITAMIN D 25 Hydroxy (Vit-D Deficiency, Fractures) - Lipid panel  Weight loss of 5% of pt's current weight via healthy diet and daily exercise encouraged.   2. Other fatigue Work up - Hemoglobin A1c - CBC with Differential/Platelet - Comprehensive metabolic panel - TSH - VITAMIN D 25 Hydroxy (Vit-D Deficiency, Fractures) - Lipid panel  3. Vitamin D deficiency  - VITAMIN D 25 Hydroxy (Vit-D Deficiency, Fractures)   PT declined mammogram, vaccination follow up as scheduled  The patient was advised to call back or seek an in-person evaluation if the symptoms worsen or if the condition fails to improve as anticipated.  I discussed the assessment and treatment plan with the patient. The patient was provided an opportunity to ask questions and all were answered. The patient agreed with the plan and demonstrated an understanding of the instructions.  The entirety of the information documented in the History of Present Illness, Review of Systems and Physical Exam were personally obtained by me. Portions of this information were initially documented by the CMA and reviewed by me for thoroughness and accuracy. Portions of this note  were created using dictation software and may contain typographical errors.     Total encounter time more than 30 minutes  Greater than 50% was spent in counseling and coordination of care with the patient     Elberta Leatherwood  [facility] [phone] (phone) [phone] (fax)  [place]

## 2021-10-18 ENCOUNTER — Ambulatory Visit: Payer: Self-pay | Admitting: *Deleted

## 2021-10-18 DIAGNOSIS — Z135 Encounter for screening for eye and ear disorders: Secondary | ICD-10-CM | POA: Diagnosis not present

## 2021-10-18 DIAGNOSIS — H5203 Hypermetropia, bilateral: Secondary | ICD-10-CM | POA: Diagnosis not present

## 2021-10-18 DIAGNOSIS — H52223 Regular astigmatism, bilateral: Secondary | ICD-10-CM | POA: Diagnosis not present

## 2021-10-18 DIAGNOSIS — H524 Presbyopia: Secondary | ICD-10-CM | POA: Diagnosis not present

## 2021-10-18 DIAGNOSIS — H2513 Age-related nuclear cataract, bilateral: Secondary | ICD-10-CM | POA: Diagnosis not present

## 2021-10-18 LAB — COMPREHENSIVE METABOLIC PANEL
ALT: 17 IU/L (ref 0–32)
AST: 20 IU/L (ref 0–40)
Albumin/Globulin Ratio: 1.6 (ref 1.2–2.2)
Albumin: 4.5 g/dL (ref 3.8–4.8)
Alkaline Phosphatase: 78 IU/L (ref 44–121)
BUN/Creatinine Ratio: 14 (ref 12–28)
BUN: 13 mg/dL (ref 8–27)
Bilirubin Total: 1.2 mg/dL (ref 0.0–1.2)
CO2: 20 mmol/L (ref 20–29)
Calcium: 9.8 mg/dL (ref 8.7–10.3)
Chloride: 103 mmol/L (ref 96–106)
Creatinine, Ser: 0.96 mg/dL (ref 0.57–1.00)
Globulin, Total: 2.9 g/dL (ref 1.5–4.5)
Glucose: 88 mg/dL (ref 70–99)
Potassium: 4.6 mmol/L (ref 3.5–5.2)
Sodium: 140 mmol/L (ref 134–144)
Total Protein: 7.4 g/dL (ref 6.0–8.5)
eGFR: 62 mL/min/{1.73_m2} (ref >59)

## 2021-10-18 LAB — CBC WITH DIFFERENTIAL/PLATELET
Basophils Absolute: 0.1 10*3/uL (ref 0.0–0.2)
Basos: 1 %
EOS (ABSOLUTE): 0.1 10*3/uL (ref 0.0–0.4)
Eos: 3 %
Hematocrit: 50.7 % — ABNORMAL HIGH (ref 34.0–46.6)
Hemoglobin: 16.7 g/dL — ABNORMAL HIGH (ref 11.1–15.9)
Immature Grans (Abs): 0 10*3/uL (ref 0.0–0.1)
Immature Granulocytes: 0 %
Lymphocytes Absolute: 2.8 10*3/uL (ref 0.7–3.1)
Lymphs: 53 %
MCH: 30.6 pg (ref 26.6–33.0)
MCHC: 32.9 g/dL (ref 31.5–35.7)
MCV: 93 fL (ref 79–97)
Monocytes Absolute: 0.5 10*3/uL (ref 0.1–0.9)
Monocytes: 9 %
Neutrophils Absolute: 1.8 10*3/uL (ref 1.4–7.0)
Neutrophils: 34 %
Platelets: 268 10*3/uL (ref 150–450)
RBC: 5.46 x10E6/uL — ABNORMAL HIGH (ref 3.77–5.28)
RDW: 13 % (ref 11.7–15.4)
WBC: 5.2 10*3/uL (ref 3.4–10.8)

## 2021-10-18 LAB — LIPID PANEL
Chol/HDL Ratio: 5.3 ratio — ABNORMAL HIGH (ref 0.0–4.4)
Cholesterol, Total: 243 mg/dL — ABNORMAL HIGH (ref 100–199)
HDL: 46 mg/dL (ref >39)
LDL Chol Calc (NIH): 181 mg/dL — ABNORMAL HIGH (ref 0–99)
Triglycerides: 89 mg/dL (ref 0–149)
VLDL Cholesterol Cal: 16 mg/dL (ref 5–40)

## 2021-10-18 LAB — TSH: TSH: 1.21 u[IU]/mL (ref 0.450–4.500)

## 2021-10-18 LAB — VITAMIN D 25 HYDROXY (VIT D DEFICIENCY, FRACTURES): Vit D, 25-Hydroxy: 29.2 ng/mL — ABNORMAL LOW (ref 30.0–100.0)

## 2021-10-18 LAB — HEMOGLOBIN A1C
Est. average glucose Bld gHb Est-mCnc: 123 mg/dL
Hgb A1c MFr Bld: 5.9 % — ABNORMAL HIGH (ref 4.8–5.6)

## 2021-10-18 NOTE — Progress Notes (Signed)
Hello [name] ,   Your labwork results all are stable for you  For prediabetes, we advised healthy diet/low-carb and daily exercise Your vit D is slightly below the normal. Advised to take vit D OTC 1,000-2,000 daily The 10-year ASCVD risk score (risk for heart attack and stroke) is: 17% You might consider to start on statin again . No changes need to be made to medications, and no further tests need to be ordered. SW Chrystal would be getting in touch with you soon, per your request.  Any questions please reach out to the office or message me on MyChart!  Best, Mardene Speak, PA-C

## 2021-10-19 NOTE — Patient Instructions (Signed)
Visit Information  Thank you for taking time to visit with me today. Please don't hesitate to contact me if I can be of assistance to you.   Following are the goals we discussed today:   Goals Addressed             This Visit's Progress    Management of Depression       Care Coordination Interventions: Patient's provider referred patient for Mental Health counseling and resources Patient contacted, she endorsed challenges with depression, and is agreeable to case management services, per patient she is considering the start of a anti-depressant Initial appointment scheduled for 10/23/21 1pm           Our next appointment is by telephone on 10/23/21 at 1pm  Please call the care guide team at [phone] if you need to cancel or reschedule your appointment.   If you are experiencing a Mental Health or [place] or need someone to talk to, please call the Suicide and Crisis Lifeline: 988   Patient verbalizes understanding of instructions and care plan provided today and agrees to view in [place]. Active MyChart status and patient understanding of how to access instructions and care plan via MyChart confirmed with patient.     Telephone follow up appointment with care management team member scheduled for: 10/23/21  Elliot Gurney, [place] Worker  [facility] Care Management [phone]

## 2021-10-19 NOTE — Patient Outreach (Addendum)
  Care Coordination   Initial Visit Note   10/19/2021 Name: [name] MRN: [id number] DOB: 04/15/1945  [name] is a 76 y.o. year old female who sees [name], [name], MD for primary care. I spoke with  [name] by phone today.  What matters to the patients health and wellness today?  Mental Health Counseling and Resources    Goals Addressed             This Visit's Progress    Management of Depression       Care Coordination Interventions: Patient's provider referred patient for Mental Health counseling and resources Patient contacted, she endorsed challenges with depression, and is agreeable to case management services, per patient she is considering the start of a anti-depressant Initial appointment scheduled for 10/23/21 1pm           SDOH assessments and interventions completed:  No     Care Coordination Interventions Activated:  Yes  Care Coordination Interventions:  Yes, provided   Follow up plan: Follow up call scheduled for 10/24/21    Encounter Outcome:  Pt. Scheduled

## 2021-10-23 ENCOUNTER — Ambulatory Visit: Payer: Self-pay | Admitting: *Deleted

## 2021-10-23 ENCOUNTER — Encounter: Payer: Self-pay | Admitting: *Deleted

## 2021-10-23 NOTE — Patient Instructions (Addendum)
Visit Information  Thank you for taking time to visit with me today. Please don't hesitate to contact me if I can be of assistance to you.   Following are the goals we discussed today:   Goals Addressed             This Visit's Progress    Management of Depression       Care Coordination Interventions: Patient's provider referred patient for Mental Health counseling and resources Patient continues to endorse challenges with depression grief and loss. Patient agreeable to ongoing mental health counseling, this social will assist with linkage to ongoing mental health counseling  Patient confirmed being open to considering the start of a anti-depressant and will discuss with PCP on 11/17/21 Patient continues to reports having thoughts of "being better off dead" however reports no plan-patient states that she just does not want to "worry anymore" Importance of establishing boundaries emphasized and discussed as well as ongoing follow up with a menta health therapist to develop strategies to manage stress           Our next appointment is by telephone on 11/13/21 at 1pm  Please call the care guide team at [phone] if you need to cancel or reschedule your appointment.   If you are experiencing a Mental Health or [place] or need someone to talk to, please call the Suicide and Crisis Lifeline: 988   Patient verbalizes understanding of instructions and care plan provided today and agrees to view in [place]. Active MyChart status and patient understanding of how to access instructions and care plan via MyChart confirmed with patient.     Telephone follow up appointment with care management team member scheduled for:11/13/21  Elliot Gurney, [place] Worker  [facility] Care Management [phone]

## 2021-10-23 NOTE — Patient Outreach (Signed)
  Care Coordination   Initial Visit Note   10/23/2021 Name: [name] MRN: [id number] DOB: 27-Oct-1945  [name] is a 76 y.o. year old female who sees [name], [name], MD for primary care. I spoke with  [name] by phone today.  What matters to the patients health and wellness today?  Mental Health Counseling and Resoures    Goals Addressed             This Visit's Progress    Management of Depression       Care Coordination Interventions: Patient's provider referred patient for Mental Health counseling and resources Patient continues to endorse challenges with depression grief and loss. Patient agreeable to ongoing mental health counseling, this social will assist with linkage to ongoing mental health counseling  Patient confirmed being open to considering the start of a anti-depressant and will discuss with PCP on 11/17/21 Patient continues to reports having thoughts of "being better off dead" however reports no plan-patient states that she just does not want to "worry anymore" Importance of establishing boundaries emphasized and discussed as well as ongoing follow up with a menta health therapist to develop strategies to manage stress           SDOH assessments and interventions completed:  Yes  SDOH Interventions Today    Flowsheet Row Most Recent Value  SDOH Interventions   Food Insecurity Interventions Intervention Not Indicated  Housing Interventions Intervention Not Indicated  Transportation Interventions Intervention Not Indicated  Depression Interventions/Treatment  Counseling  Social Connections Interventions Intervention Not Indicated        Care Coordination Interventions Activated:  Yes  Care Coordination Interventions:  Yes, provided   Follow up plan: Follow up call scheduled for 11/13/21    Encounter Outcome:  Pt. Visit Completed

## 2021-10-31 ENCOUNTER — Other Ambulatory Visit: Payer: Self-pay | Admitting: Physician Assistant

## 2021-11-02 ENCOUNTER — Other Ambulatory Visit: Payer: Self-pay | Admitting: Physician Assistant

## 2021-11-05 ENCOUNTER — Other Ambulatory Visit: Payer: Self-pay | Admitting: Physician Assistant

## 2021-11-06 ENCOUNTER — Other Ambulatory Visit: Payer: Self-pay | Admitting: Family Medicine

## 2021-11-06 DIAGNOSIS — E785 Hyperlipidemia, unspecified: Secondary | ICD-10-CM

## 2021-11-06 MED ORDER — ROSUVASTATIN CALCIUM 40 MG PO TABS: 40.0000 mg | ORAL_TABLET | Freq: Every day | ORAL | 1 refills | Status: DC

## 2021-11-13 ENCOUNTER — Encounter: Payer: Self-pay | Admitting: *Deleted

## 2021-11-14 ENCOUNTER — Ambulatory Visit: Payer: Self-pay | Admitting: *Deleted

## 2021-11-14 NOTE — Patient Instructions (Signed)
Visit Information  Thank you for taking time to visit with me today. Please don't hesitate to contact me if I can be of assistance to you.   Following are the goals we discussed today:   Goals Addressed             This Visit's Progress    Management of Depression       Care Coordination Interventions: Patient's provider referred patient for Mental Health counseling and resources Patient continues to endorse challenges with depression grief and loss-patient currently dealing with multiple losses  Patient agreeable to ongoing mental health counseling, this social will continue to assist with linkage to ongoing mental health counseling  Patient continues to confirm being open to considering the start of a anti-depressant and will discuss with PCP on 11/17/21 Patient currently denies thoughts of self harm at this time [place] has close friends that she can talk to, she also has worked to change her perspective Importance of establishing boundaries emphasized and discussed as well as ongoing follow up with a menta health therapist to develop strategies to manage stress           Our next appointment is by telephone on 11/28/21 at 10am  Please call the care guide team at [phone] if you need to cancel or reschedule your appointment.   If you are experiencing a Mental Health or [place] or need someone to talk to, please call the Suicide and Crisis Lifeline: 988 call 911   Patient verbalizes understanding of instructions and care plan provided today and agrees to view in [place]. Active MyChart status and patient understanding of how to access instructions and care plan via MyChart confirmed with patient.     Telephone follow up appointment with care management team member scheduled for: 11/28/21  Elliot Gurney, [place] Worker  [facility] Care Management [phone]

## 2021-11-14 NOTE — Patient Outreach (Signed)
  Care Coordination   Follow Up Visit Note   11/14/2021 Name: ARRIANA LOHMANN MRN: [id number] DOB: [birth year]  [name] is a 76 y.o. year old female who sees [name], [name], MD for primary care. I spoke with  [name] by phone today.  What matters to the patients health and wellness today?  Mental Health Support and resources    Goals Addressed             This Visit's Progress    Management of Depression       Care Coordination Interventions: Patient's provider referred patient for Mental Health counseling and resources Patient continues to endorse challenges with depression grief and loss-patient currently dealing with multiple losses  Patient agreeable to ongoing mental health counseling, this social will continue to assist with linkage to ongoing mental health counseling  Patient continues to confirm being open to considering the start of a anti-depressant and will discuss with PCP on 11/17/21 Patient currently denies thoughts of self harm at this time [place] has close friends that she can talk to, she also has worked to change her perspective Importance of establishing boundaries emphasized and discussed as well as ongoing follow up with a menta health therapist to develop strategies to manage stress           SDOH assessments and interventions completed:  No     Care Coordination Interventions Activated:  Yes  Care Coordination Interventions:  Yes, provided   Follow up plan: Follow up call scheduled for 11/28/21    Encounter Outcome:  Pt. Visit Completed

## 2021-11-16 ENCOUNTER — Ambulatory Visit (INDEPENDENT_AMBULATORY_CARE_PROVIDER_SITE_OTHER): Payer: Medicare Other | Admitting: Family Medicine

## 2021-11-16 ENCOUNTER — Encounter: Payer: Self-pay | Admitting: Family Medicine

## 2021-11-16 DIAGNOSIS — R051 Acute cough: Secondary | ICD-10-CM | POA: Diagnosis not present

## 2021-11-16 DIAGNOSIS — J0110 Acute frontal sinusitis, unspecified: Secondary | ICD-10-CM | POA: Diagnosis not present

## 2021-11-16 MED ORDER — DOXYCYCLINE HYCLATE 100 MG PO TABS: 100.0000 mg | ORAL_TABLET | Freq: Two times a day (BID) | ORAL | 0 refills | Status: DC

## 2021-11-16 MED ORDER — BENZONATATE 200 MG PO CAPS: 200.0000 mg | ORAL_CAPSULE | Freq: Two times a day (BID) | ORAL | 0 refills | Status: DC | PRN

## 2021-11-16 NOTE — Progress Notes (Signed)
Virtual telephone visit    Virtual Visit via Telephone Note   This format is felt to be most appropriate for this patient at this time. The patient did not have access to video technology or had technical difficulties with video requiring transitioning to audio format only (telephone). Physical exam was limited to content and character of the telephone converstion.    Patient location: home Provider location: [facility]  I discussed the limitations of evaluation and management by telemedicine and the availability of in person appointments. The patient expressed understanding and agreed to proceed.   Visit Date: 11/16/2021  Today's healthcare provider: Lelon Huh, MD   Chief Complaint  Patient presents with   Sinus Problem   Cough   Subjective    Cough This is a new problem. Episode onset: 3 days ago. The problem has been gradually worsening. The cough is Productive of sputum. Associated symptoms include headaches, nasal congestion, postnasal drip, rhinorrhea and a sore throat. Pertinent negatives include no chest pain, chills, fever or shortness of breath. Treatments tried: OTC cold and sinus medication.   She states symptoms are identical to symptoms she had in April, which initially failed treatment with azithromycin, but cleared after course of doxycyline.   Home COVID test was negative 2 days ago.    Medications: Outpatient Medications Prior to Visit  Medication Sig   albuterol (VENTOLIN HFA) 108 (90 Base) MCG/ACT inhaler TAKE 2 PUFFS BY MOUTH EVERY 6 HOURS AS NEEDED FOR WHEEZE OR SHORTNESS OF BREATH   albuterol (VENTOLIN HFA) 108 (90 Base) MCG/ACT inhaler Inhale into the lungs.   ALPRAZolam (XANAX) 0.5 MG tablet TAKE 1 TABLET BY MOUTH THREE TIMES DAILY AS NEEDED FOR ANXIETY/ SLEEP   aspirin EC 81 MG tablet Take 81 mg by mouth daily.   clotrimazole (LOTRIMIN) 1 % cream Apply 1 application topically 2 (two) times daily.   clotrimazole (MYCELEX) 10 MG troche Take 1 tablet  (10 mg total) by mouth 5 (five) times daily.   fluticasone (FLONASE) 50 MCG/ACT nasal spray PLACE 1-2 SPRAYS INTO BOTH NOSTRILS DAILY AS NEEDED FOR ALLERGIES OR RHINITIS.   ibuprofen (ADVIL) 800 MG tablet TAKE 1 TABLET (800 MG TOTAL) BY MOUTH DAILY AS NEEDED (SEVERE PAIN).   omeprazole (PRILOSEC) 20 MG capsule Take 20 mg by mouth daily as needed (acid reflux).    rosuvastatin (CRESTOR) 40 MG tablet Take 1 tablet (40 mg total) by mouth daily.   Spacer/Aero-Holding Chambers (EASIVENT) inhaler Use as needed with inhaler   Spacer/Aero-Holding Chambers (EQ SPACE CHAMBER ANTI-STATIC) DEVI Inhale into the lungs as needed.   [DISCONTINUED] Acetaminophen-Codeine 300-30 MG tablet  (Patient not taking: Reported on 11/16/2021)   [DISCONTINUED] benzonatate (TESSALON) 200 MG capsule Take 1 capsule (200 mg total) by mouth 2 (two) times daily as needed for cough. (Patient not taking: Reported on 11/16/2021)   [DISCONTINUED] clindamycin (CLEOCIN) 150 MG capsule Take 150 mg by mouth 3 (three) times daily.   [DISCONTINUED] fluconazole (DIFLUCAN) 150 MG tablet Take 150 mg by mouth once.   No facility-administered medications prior to visit.    Review of Systems  Constitutional:  Negative for appetite change, chills, fatigue and fever.  HENT:  Positive for congestion, postnasal drip, rhinorrhea and sore throat.   Respiratory:  Positive for cough (productive with green phlegm). Negative for chest tightness and shortness of breath.   Cardiovascular:  Negative for chest pain and palpitations.  Gastrointestinal:  Negative for abdominal pain, nausea and vomiting.  Neurological:  Positive for headaches. Negative  for dizziness and weakness.       Objective    There were no vitals taken for this visit.   Awake, alert, oriented x 3. In no apparent distress   Assessment & Plan     1. Acute non-recurrent frontal sinusitis  - doxycycline (VIBRA-TABS) 100 MG tablet; Take 1 tablet (100 mg total) by mouth 2 (two)  times daily for 10 days.  Dispense: 20 tablet; Refill: 0  2. Acute cough  - benzonatate (TESSALON) 200 MG capsule; Take 1 capsule (200 mg total) by mouth 2 (two) times daily as needed for cough.  Dispense: 20 capsule; Refill: 0     I discussed the assessment and treatment plan with the patient. The patient was provided an opportunity to ask questions and all were answered. The patient agreed with the plan and demonstrated an understanding of the instructions.   The patient was advised to call back or seek an in-person evaluation if the symptoms worsen or if the condition fails to improve as anticipated.  I provided 9 minutes of non-face-to-face time during this encounter.  The entirety of the information documented in the History of Present Illness, Review of Systems and Physical Exam were personally obtained by me. Portions of this information were initially documented by the CMA and reviewed by me for thoroughness and accuracy.    Lelon Huh, MD [facility] [phone] (phone) [phone] (fax)  [place]

## 2021-11-17 MED ORDER — DOXYCYCLINE HYCLATE 100 MG PO TABS: 100.0000 mg | ORAL_TABLET | Freq: Two times a day (BID) | ORAL | 0 refills | Status: AC

## 2021-11-17 MED ORDER — FLUCONAZOLE 150 MG PO TABS: 150.0000 mg | ORAL_TABLET | Freq: Once | ORAL | 0 refills | Status: AC

## 2021-11-17 MED ORDER — BENZONATATE 200 MG PO CAPS: 200.0000 mg | ORAL_CAPSULE | Freq: Two times a day (BID) | ORAL | 0 refills | Status: DC | PRN

## 2021-11-17 MED ORDER — HYDROCODONE BIT-HOMATROP MBR 5-1.5 MG/5ML PO SOLN: 5.0000 mL | Freq: Three times a day (TID) | ORAL | 0 refills | Status: DC | PRN

## 2021-11-17 NOTE — Addendum Note (Signed)
Addended by: Birdie Sons on: 11/17/2021 11:43 AM   Modules accepted: Orders

## 2021-11-17 NOTE — Addendum Note (Signed)
Addended by: Lelon Huh E on: 11/17/2021 11:41 AM   Modules accepted: Orders

## 2021-11-28 ENCOUNTER — Ambulatory Visit: Payer: Self-pay | Admitting: *Deleted

## 2021-11-28 NOTE — Patient Outreach (Signed)
Phone call to patient for scheduled appointment, patient not able to talk at time of the call. Will re-schedule for 11/29/21

## 2021-11-29 ENCOUNTER — Ambulatory Visit: Payer: Self-pay | Admitting: *Deleted

## 2021-11-29 NOTE — Patient Outreach (Signed)
  Care Coordination   Follow Up Visit Note   11/29/2021 Name: [name] MRN: [id number] DOB: 1945-12-30  [name] is a 76 y.o. year old female who sees [name], [name], MD for primary care. I spoke with  [name] by phone today.  What matters to the patients health and wellness today?  Mental health support and resources    Goals Addressed             This Visit's Progress    Management of Depression       Care Coordination Interventions: Patient's provider referred patient for Mental Health counseling and resources Patient continues to endorse challenges with anxiety, however today reports being less symptomatic Patient agreeable to receiving resources for mental health counseling in the event that she needs them in the future-contact information provided for [place] discussed and continues to be reinforced-patient has close friends that she continues to talk to for support, patient describes a strong spiritual connection, and practice of focusing on things that she can control Patient discussed starting to establish better boundaries to manage stress as well as considering ongoing mental health follow up           SDOH assessments and interventions completed:  No     Care Coordination Interventions Activated:  Yes  Care Coordination Interventions:  Yes, provided   Follow up plan: Follow up call scheduled for 12/20/21    Encounter Outcome:  Pt. Visit Completed

## 2021-11-29 NOTE — Patient Instructions (Signed)
Visit Information  Thank you for taking time to visit with me today. Please don't hesitate to contact me if I can be of assistance to you.   Following are the goals we discussed today:   Goals Addressed             This Visit's Progress    Management of Depression       Care Coordination Interventions: Patient's provider referred patient for Mental Health counseling and resources Patient continues to endorse challenges with anxiety, however today reports being less symptomatic Patient agreeable to receiving resources for mental health counseling in the event that she needs them in the future-contact information provided for [place] discussed and continues to be reinforced-patient has close friends that she continues to talk to for support, patient describes a strong spiritual connection, and practice of focusing on things that she can control Patient discussed starting to establish better boundaries to manage stress as well as considering ongoing mental health follow up           [place] next appointment is by telephone on 12/20/21 at 10:30am  Please call the care guide team at [phone] if you need to cancel or reschedule your appointment.   If you are experiencing a Mental Health or [place] or need someone to talk to, please call the Suicide and Crisis Lifeline: 988   Patient verbalizes understanding of instructions and care plan provided today and agrees to view in [place]. Active MyChart status and patient understanding of how to access instructions and care plan via MyChart confirmed with patient.     Telephone follow up appointment with care management team member scheduled for: 12/20/21  Elliot Gurney, [place] Worker  [facility] Care Management [phone]

## 2021-12-10 ENCOUNTER — Ambulatory Visit: Payer: Self-pay

## 2021-12-10 NOTE — Telephone Encounter (Signed)
  Chief Complaint: productive cough, fever Symptoms: see below Frequency: 5 days Pertinent Negatives: Patient denies SOB, chest pain Disposition: '[]'$ ED /'[]'$ Urgent Care (no appt availability in office) / '[x]'$ Appointment(In office/virtual)/ '[]'$  [facility]/ '[]'$ Home Care/ '[]'$ Refused Recommended Disposition /'[]'$ [place] [facility]/ '[]'$  Follow-up with PCP Additional Notes: Pt has had 5 days of fever, productive cough(green/yellow), sinus congestion and drainage. And chest tightness per agent- she states not so much chest tightness.   Reason for Disposition  Fever present > 3 days (72 hours)  Answer Assessment - Initial Assessment Questions 1. ONSET: "When did the cough begin?"      5 days 2. SEVERITY: "How bad is the cough today?"      moderate 3. SPUTUM: "Describe the color of your sputum" (none, dry cough; clear, white, yellow, green)     Green yellow 4. HEMOPTYSIS: "Are you coughing up any blood?" If so ask: "How much?" (flecks, streaks, tablespoons, etc.)     no 5. DIFFICULTY BREATHING: "Are you having difficulty breathing?" If Yes, ask: "How bad is it?" (e.g., mild, moderate, severe)    - MILD: No SOB at rest, mild SOB with walking, speaks normally in sentences, can lie down, no retractions, pulse < 100.    - MODERATE: SOB at rest, SOB with minimal exertion and prefers to sit, cannot lie down flat, speaks in phrases, mild retractions, audible wheezing, pulse 100-120.    - SEVERE: Very SOB at rest, speaks in single words, struggling to breathe, sitting hunched forward, retractions, pulse > 120      no 6. FEVER: "Do you have a fever?" If Yes, ask: "What is your temperature, how was it measured, and when did it start?"     fever 7. CARDIAC HISTORY: "Do you have any history of heart disease?" (e.g., heart attack, congestive heart failure)      no 8. LUNG HISTORY: "Do you have any history of lung disease?"  (e.g., pulmonary embolus, asthma, emphysema)     no 9. PE RISK FACTORS: "Do  you have a history of blood clots?" (or: recent major surgery, recent prolonged travel, bedridden)      10. OTHER SYMPTOMS: "Do you have any other symptoms?" (e.g., runny nose, wheezing, chest pain)       Runny nose, Fever, chest tightness, sinus congestion. 11. PREGNANCY: "Is there any chance you are pregnant?" "When was your last menstrual period?"       na 12. TRAVEL: "Have you traveled out of the country in the last month?" (e.g., travel history, exposures)       no  Protocols used: Cough - Acute Productive-A-AH

## 2021-12-11 ENCOUNTER — Ambulatory Visit (INDEPENDENT_AMBULATORY_CARE_PROVIDER_SITE_OTHER): Payer: Medicare Other | Admitting: Physician Assistant

## 2021-12-11 ENCOUNTER — Encounter: Payer: Self-pay | Admitting: Physician Assistant

## 2021-12-11 ENCOUNTER — Telehealth: Payer: Self-pay

## 2021-12-11 VITALS — BP 126/86 | HR 78 | Temp 98.4°F | Resp 16 | Ht 63.0 in | Wt 181.0 lb

## 2021-12-11 DIAGNOSIS — J329 Chronic sinusitis, unspecified: Secondary | ICD-10-CM | POA: Diagnosis not present

## 2021-12-11 DIAGNOSIS — R0989 Other specified symptoms and signs involving the circulatory and respiratory systems: Secondary | ICD-10-CM | POA: Diagnosis not present

## 2021-12-11 DIAGNOSIS — R051 Acute cough: Secondary | ICD-10-CM

## 2021-12-11 DIAGNOSIS — J301 Allergic rhinitis due to pollen: Secondary | ICD-10-CM | POA: Diagnosis not present

## 2021-12-11 DIAGNOSIS — K219 Gastro-esophageal reflux disease without esophagitis: Secondary | ICD-10-CM | POA: Diagnosis not present

## 2021-12-11 MED ORDER — BENZONATATE 200 MG PO CAPS: 200.0000 mg | ORAL_CAPSULE | Freq: Two times a day (BID) | ORAL | 0 refills | Status: DC | PRN

## 2021-12-11 MED ORDER — OMEPRAZOLE 40 MG PO CPDR: 40.0000 mg | DELAYED_RELEASE_CAPSULE | Freq: Every day | ORAL | 3 refills | Status: DC

## 2021-12-11 NOTE — Telephone Encounter (Signed)
Copied from [place] [id number]. Topic: General - Other >> Dec 11, 2021 12:41 PM Ja-Kwan M wrote: Reason for CRM: Pt requests return call to discuss when she is suppose to go in for imaging.

## 2021-12-11 NOTE — Progress Notes (Signed)
I,Tiffany J Bragg,acting as a Education administrator for [employer], PA-C.,have documented all relevant documentation on the behalf of Mardene Speak, PA-C,as directed by  [employer], PA-C while in the presence of [employer], PA-C.   Established patient visit   Patient: [name]   DOB: 06-19-1945   76 y.o. Female  MRN: [id number] Visit Date: 12/11/2021  Today's healthcare provider: Mardene Speak, PA-C   Chief Complaint  Patient presents with   Cough    Patient complains of sinus congestion, fever, productive cough, for 5 days. Did covid test at home with negative results.    Subjective    HPI HPI     Cough    Additional comments: Patient complains of sinus congestion, fever, productive cough, for 5 days. Did covid test at home with negative results.       Last edited by Smitty Knudsen, CMA on 12/11/2021 10:53 AM.      Also, she reports having some abdominal discomfort, bloating, belching She tried tums and it helped.  Medications: Outpatient Medications Prior to Visit  Medication Sig   albuterol (VENTOLIN HFA) 108 (90 Base) MCG/ACT inhaler TAKE 2 PUFFS BY MOUTH EVERY 6 HOURS AS NEEDED FOR WHEEZE OR SHORTNESS OF BREATH   albuterol (VENTOLIN HFA) 108 (90 Base) MCG/ACT inhaler Inhale into the lungs.   ALPRAZolam (XANAX) 0.5 MG tablet TAKE 1 TABLET BY MOUTH THREE TIMES DAILY AS NEEDED FOR ANXIETY/ SLEEP   aspirin EC 81 MG tablet Take 81 mg by mouth daily.   benzonatate (TESSALON) 200 MG capsule Take 1 capsule (200 mg total) by mouth 2 (two) times daily as needed for cough.   clotrimazole (LOTRIMIN) 1 % cream Apply 1 application topically 2 (two) times daily.   clotrimazole (MYCELEX) 10 MG troche Take 1 tablet (10 mg total) by mouth 5 (five) times daily.   fluticasone (FLONASE) 50 MCG/ACT nasal spray PLACE 1-2 SPRAYS INTO BOTH NOSTRILS DAILY AS NEEDED FOR ALLERGIES OR RHINITIS.   HYDROcodone bit-homatropine (HYCODAN) 5-1.5 MG/5ML syrup Take 5 mLs by mouth every 8 (eight)  hours as needed for cough.   ibuprofen (ADVIL) 800 MG tablet TAKE 1 TABLET (800 MG TOTAL) BY MOUTH DAILY AS NEEDED (SEVERE PAIN).   omeprazole (PRILOSEC) 20 MG capsule Take 20 mg by mouth daily as needed (acid reflux).    rosuvastatin (CRESTOR) 40 MG tablet Take 1 tablet (40 mg total) by mouth daily.   Spacer/Aero-Holding Chambers (EASIVENT) inhaler Use as needed with inhaler   Spacer/Aero-Holding Chambers (EQ SPACE CHAMBER ANTI-STATIC) DEVI Inhale into the lungs as needed.   No facility-administered medications prior to visit.    ROS positive for congestion, chest congestion, rhinorrhea, abdominal discomfort Except see HPI     Objective    BP 126/86 (BP Location: Right Arm, Patient Position: Sitting, Cuff Size: Normal)   Pulse 78   Temp 98.4 F (36.9 C) (Oral)   Resp 16   Ht '5\' 3"'$  (1.6 m)   Wt 181 lb (82.1 kg)   SpO2 99%   BMI 32.06 kg/m    Physical Exam Vitals reviewed.  Constitutional:      General: She is not in acute distress.    Appearance: Normal appearance. She is well-developed. She is not diaphoretic.  HENT:     Head: Normocephalic and atraumatic.     Right Ear: Tympanic membrane, ear canal and external ear normal.     Left Ear: Tympanic membrane, ear canal and external ear normal. There is no impacted  cerumen.     Nose: Congestion and rhinorrhea present.  Eyes:     General: No scleral icterus.    Conjunctiva/sclera: Conjunctivae normal.  Neck:     Thyroid: No thyromegaly.  Cardiovascular:     Rate and Rhythm: Normal rate and regular rhythm.     Pulses: Normal pulses.     Heart sounds: Normal heart sounds. No murmur heard. Pulmonary:     Effort: Pulmonary effort is normal. No respiratory distress.     Breath sounds: Normal breath sounds. No wheezing, rhonchi or rales.  Abdominal:     General: Abdomen is flat. Bowel sounds are normal. There is distension.     Palpations: Abdomen is soft.     Tenderness: There is no abdominal tenderness. There is no right  CVA tenderness, left CVA tenderness, guarding or rebound.     Hernia: No hernia is present.  Musculoskeletal:        General: Normal range of motion.     Cervical back: Normal range of motion and neck supple.     Right lower leg: No edema.     Left lower leg: No edema.  Lymphadenopathy:     Cervical: No cervical adenopathy.  Skin:    General: Skin is warm and dry.     Findings: No rash.  Neurological:     Mental Status: She is alert and oriented to person, place, and time. Mental status is at baseline.     Gait: Gait normal.  Psychiatric:        Behavior: Behavior normal.        Thought Content: Thought content normal.        Judgment: Judgment normal.      No results found for any visits on 12/11/21.  Assessment & Plan     1. Acute cough  - benzonatate (TESSALON) 200 MG capsule; Take 1 capsule (200 mg total) by mouth 2 (two) times daily as needed for cough.  Dispense: 20 capsule; Refill: 0 - omeprazole (PRILOSEC) 40 MG capsule; Take 1 capsule (40 mg total) by mouth daily.  Dispense: 30 capsule; Refill: 3 - DG Chest 2 View; Future  2. Seasonal allergic rhinitis due to pollen  - benzonatate (TESSALON) 200 MG capsule; Take 1 capsule (200 mg total) by mouth 2 (two) times daily as needed for cough.  Dispense: 20 capsule; Refill: 0 - omeprazole (PRILOSEC) 40 MG capsule; Take 1 capsule (40 mg total) by mouth daily.  Dispense: 30 capsule; Refill: 3  3. Recurrent sinusitis  - benzonatate (TESSALON) 200 MG capsule; Take 1 capsule (200 mg total) by mouth 2 (two) times daily as needed for cough.  Dispense: 20 capsule; Refill: 0 - omeprazole (PRILOSEC) 40 MG capsule; Take 1 capsule (40 mg total) by mouth daily.  Dispense: 30 capsule; Refill: 3 - DG Chest 2 View; Future We will need to add a medication to her treatment Discussed a referral to ENT  4. Gastroesophageal reflux disease without esophagitis  - benzonatate (TESSALON) 200 MG capsule; Take 1 capsule (200 mg total) by mouth 2  (two) times daily as needed for cough.  Dispense: 20 capsule; Refill: 0 - omeprazole (PRILOSEC) 40 MG capsule; Take 1 capsule (40 mg total) by mouth daily.  Dispense: 30 capsule; Refill: 3  Recent  colonoscopy showed - Diverticulosis in the descending colon. - The distal rectum and anal verge are normal on retroflexion view. - No specimens collected. Recommended to Repeat colonoscopy in 5 years for surveillance.  Pt will FU  with GI this week  5. Abnormal lung sounds  - DG Chest 2 View; Future  FU PRN     The patient was advised to call back or seek an in-person evaluation if the symptoms worsen or if the condition fails to improve as anticipated.  I discussed the assessment and treatment plan with the patient. The patient was provided an opportunity to ask questions and all were answered. The patient agreed with the plan and demonstrated an understanding of the instructions.  The entirety of the information documented in the History of Present Illness, Review of Systems and Physical Exam were personally obtained by me. Portions of this information were initially documented by the CMA and reviewed by me for thoroughness and accuracy.  Portions of this note were created using dictation software and may contain typographical errors.   Total encounter time more than 30 minutes Greater than 50% was spent in counseling and coordination of care with the patient   Elberta Leatherwood  [facility] [phone] (phone) [phone] (fax)  [place]

## 2021-12-12 ENCOUNTER — Ambulatory Visit
Admission: RE | Admit: 2021-12-12 | Discharge: 2021-12-12 | Disposition: A | Discharge status: Home / Self Care | Payer: Medicare Other | Source: Ambulatory Visit | Attending: Physician Assistant | Admitting: Physician Assistant

## 2021-12-12 ENCOUNTER — Ambulatory Visit
Admission: RE | Admit: 2021-12-12 | Discharge: 2021-12-12 | Disposition: A | Discharge status: Home / Self Care | Payer: Medicare Other | Attending: Physician Assistant | Admitting: Physician Assistant

## 2021-12-12 DIAGNOSIS — R0989 Other specified symptoms and signs involving the circulatory and respiratory systems: Secondary | ICD-10-CM | POA: Insufficient documentation

## 2021-12-12 DIAGNOSIS — R059 Cough, unspecified: Secondary | ICD-10-CM | POA: Diagnosis not present

## 2021-12-12 DIAGNOSIS — R051 Acute cough: Secondary | ICD-10-CM | POA: Diagnosis not present

## 2021-12-12 DIAGNOSIS — J329 Chronic sinusitis, unspecified: Secondary | ICD-10-CM

## 2021-12-12 DIAGNOSIS — R0602 Shortness of breath: Secondary | ICD-10-CM | POA: Diagnosis not present

## 2021-12-13 DIAGNOSIS — K439 Ventral hernia without obstruction or gangrene: Secondary | ICD-10-CM | POA: Diagnosis not present

## 2021-12-14 ENCOUNTER — Ambulatory Visit: Payer: Self-pay | Admitting: *Deleted

## 2021-12-14 MED ORDER — DOXYCYCLINE HYCLATE 100 MG PO TABS: 100.0000 mg | ORAL_TABLET | Freq: Two times a day (BID) | ORAL | 0 refills | Status: AC

## 2021-12-14 NOTE — Telephone Encounter (Signed)
FYI-patient advised of x-ray results.

## 2021-12-14 NOTE — Telephone Encounter (Signed)
Summary: Rx for sinus infection   Pt stated she was suppose to receive a Rx for sinus infection but the Rx was not given to her due to other issues she had. Pt stated those issue have been taking care of and she would like to request the Rx that she was going to be prescribed. Pt requests call back. Cb# [phone]        Chief Complaint: requesting cough medication  Symptoms: nasal stuffiness . One nostril blocked and then the other one gets blocked. Constant drainage from nose. Can breath. Coughing reported Frequency: na Pertinent Negatives: Patient denies no fever Disposition: '[]'$ ED /'[]'$ Urgent Care (no appt availability in office) / '[]'$ Appointment(In office/virtual)/ '[]'$  [place] [facility]/ '[]'$ Home Care/ '[]'$ Refused Recommended Disposition /'[]'$ [place] [facility]/ '[x]'$  Follow-up with PCP Additional Notes:  Patient seen by PCP 12/11/21 for same issues. Reports see GI dr and patient can take cough medication with codeine in it . Colon issues coming from stress and coughing will not be good due to possible cause of hernia per patient.  Please advise regarding medication to be prescribed. Patient aware tessalon to be taken for cough as well.     Reason for Disposition  Lots of coughing  Answer Assessment - Initial Assessment Questions 1. LOCATION: "Where does it hurt?"      Na  2. ONSET: "When did the sinus pain start?"  (e.g., hours, days)      Last seen in OV 12/11/21 3. SEVERITY: "How bad is the pain?"   (Scale 1-10; mild, moderate or severe)   - MILD (1-3): doesn't interfere with normal activities    - MODERATE (4-7): interferes with normal activities (e.g., work or school) or awakens from sleep   - SEVERE (8-10): excruciating pain and patient unable to do any normal activities        na 4. RECURRENT SYMPTOM: "Have you ever had sinus problems before?" If Yes, ask: "When was the last time?" and "What happened that time?"      Yes  5. NASAL CONGESTION: "Is the nose blocked?" If Yes,  ask: "Can you open it or must you breathe through your mouth?"     One nostril closes and then the other closes at times can breath  6. NASAL DISCHARGE: "Do you have discharge from your nose?" If so ask, "What color?"     One nostril constantly running 7. FEVER: "Do you have a fever?" If Yes, ask: "What is it, how was it measured, and when did it start?"      no 8. OTHER SYMPTOMS: "Do you have any other symptoms?" (e.g., sore throat, cough, earache, difficulty breathing)     Reports seeing Dr. Wilburn Mylar and reports colon issues from stress and cough could cause issues of a hernia with coughing. Reports GI reports she can take cough medication with codeine in it  9. PREGNANCY: "Is there any chance you are pregnant?" "When was your last menstrual period?"     na  Protocols used: Sinus Pain or Congestion-A-AH

## 2021-12-14 NOTE — Telephone Encounter (Signed)
Patient inquiring about imaging results and would like a follow up call.

## 2021-12-14 NOTE — Progress Notes (Signed)
Please, let pt know that her CXR showed no active cardiopulmonary disease Please, continue your current regimen as we discussed

## 2021-12-17 ENCOUNTER — Other Ambulatory Visit: Payer: Self-pay | Admitting: Physician Assistant

## 2021-12-17 ENCOUNTER — Encounter: Payer: Medicare Other | Admitting: Family Medicine

## 2021-12-17 DIAGNOSIS — B372 Candidiasis of skin and nail: Secondary | ICD-10-CM

## 2021-12-17 DIAGNOSIS — J0140 Acute pansinusitis, unspecified: Secondary | ICD-10-CM

## 2021-12-17 MED ORDER — FLUCONAZOLE 150 MG PO TABS: 150.0000 mg | ORAL_TABLET | Freq: Once | ORAL | 0 refills | Status: AC

## 2021-12-17 NOTE — Progress Notes (Signed)
Diflucan was sent. She needs to take 2nd tablet 72 hours after taking 1 st tablet.

## 2021-12-18 ENCOUNTER — Telehealth: Payer: Self-pay

## 2021-12-18 DIAGNOSIS — R051 Acute cough: Secondary | ICD-10-CM

## 2021-12-18 MED ORDER — HYDROCODONE BIT-HOMATROP MBR 5-1.5 MG/5ML PO SOLN: 5.0000 mL | Freq: Three times a day (TID) | ORAL | 0 refills | Status: DC | PRN

## 2021-12-18 NOTE — Telephone Encounter (Signed)
[place]  12/17/2021 12:09 PM EDT Back to Top    I called and advised patient of message below. She says she still has a bad cough. She requested a prescription for hycodan cough syrup to help with the cough. Patient states that she has a hernia, and Dr. Bary Castilla is concerned that her cough will exacerbate the hernia due to her excessive coughing. Patient would like a prescription for Hycodan cough syrup sent into [facility] ([address]

## 2021-12-18 NOTE — Telephone Encounter (Signed)
-----   Message from Mardene Speak, [place] sent at 12/14/2021  1:54 PM EDT ----- Please, let pt know that her CXR showed no active cardiopulmonary disease Please, continue your current regimen as we discussed

## 2021-12-20 ENCOUNTER — Encounter: Payer: Self-pay | Admitting: *Deleted

## 2021-12-24 ENCOUNTER — Encounter: Payer: Self-pay | Admitting: *Deleted

## 2021-12-24 ENCOUNTER — Telehealth: Payer: Self-pay | Admitting: *Deleted

## 2021-12-24 NOTE — Patient Outreach (Signed)
Care Coordination   12/24/2021 Name: [name] MRN: [id number] DOB: 1945/05/21   Care Coordination Outreach Attempts:  An unsuccessful telephone outreach was attempted today to offer the patient information about available care coordination services as a benefit of their health plan.   Follow Up Plan:  Additional outreach attempts will be made to offer the patient care coordination information and services.   Encounter Outcome:  No Answer  Care Coordination Interventions Activated:  No   Care Coordination Interventions:  No, not indicated    Archimedes Harold, LCSW Clinical Social Worker  [facility] [phone]

## 2022-01-14 ENCOUNTER — Ambulatory Visit: Payer: Self-pay

## 2022-01-14 NOTE — Telephone Encounter (Signed)
  Chief Complaint: Anxiety - Bowel obstruction? Symptoms: Anxiety Frequency: Ongoing Pertinent Negatives: Patient denies self harm Disposition: '[]'$ ED /'[x]'$ Urgent Care (no appt availability in office) / '[]'$ Appointment(In office/virtual)/ '[]'$  [place] [facility]/ '[]'$ Home Care/ '[]'$ Refused Recommended Disposition /'[]'$ [place] [facility]/ '[]'$  Follow-up with PCP Additional Notes: Pt called regarding additional medication for her anxiety. Pt states that she has been offered additional medication to supplement Xanax for her anxiety, but has refused in the past. She also states that she has been given several different medications to help with anxiety that have not been helpful. PT also states that she called her surgeon to have an additional sx for her colon.  PT states that when she get anxious she had difficulties with her colon, chest pain from gas and blockages.  PT will go to UC regarding possible blockage. PT needs follow up in for medication adjustment.  Please call pt back.

## 2022-01-14 NOTE — Telephone Encounter (Signed)
Reason for Disposition . Patient sounds very upset or troubled to the triager  Answer Assessment - Initial Assessment Questions 1. CONCERN: "Did anything happen that prompted you to call today?"      Stomach issues - stress in stomach 2. ANXIETY SYMPTOMS: "Can you describe how you (your loved one; patient) have been feeling?" (e.g., tense, restless, panicky, anxious, keyed up, overwhelmed, sense of impending doom).      Stressed - bowel obstruction 3. ONSET: "How long have you been feeling this way?" (e.g., hours, days, weeks)     Ongoing 4. SEVERITY: "How would you rate the level of anxiety?" (e.g., 0 - 10; or mild, moderate, severe).     moderate 5. FUNCTIONAL IMPAIRMENT: "How have these feelings affected your ability to do daily activities?" "Have you had more difficulty than usual doing your normal daily activities?" (e.g., getting better, same, worse; self-care, school, work, interactions)     yes 6. HISTORY: "Have you felt this way before?" "Have you ever been diagnosed with an anxiety problem in the past?" (e.g., generalized anxiety disorder, panic attacks, PTSD). If Yes, ask: "How was this problem treated?" (e.g., medicines, counseling, etc.)     1 month 7. RISK OF HARM - SUICIDAL IDEATION: "Do you ever have thoughts of hurting or killing yourself?" If Yes, ask:  "Do you have these feelings now?" "Do you have a plan on how you would do this?"     no 8. TREATMENT:  "What has been done so far to treat this anxiety?" (e.g., medicines, relaxation strategies). "What has helped?"     xanax 9. TREATMENT - THERAPIST: "Do you have a counselor or therapist? Name?"     Sees social worker 10. POTENTIAL TRIGGERS: "Do you drink caffeinated beverages (e.g., coffee, colas, teas), and how much daily?" "Do you drink alcohol or use any drugs?" "Have you started any new medicines recently?"       No alcohol.no coffee 11. PATIENT SUPPORT: "Who is with you now?" "Who do you live with?" "Do you have family  or friends who you can talk to?"        Husband - not helpful 12. OTHER SYMPTOMS: "Do you have any other symptoms?" (e.g., feeling depressed, trouble concentrating, trouble sleeping, trouble breathing, palpations. Chest pain - from gas  13. PREGNANCY: "Is there any chance you are pregnant?" "When was your last menstrual period?"  Protocols used: Anxiety and Panic Attack-A-AH

## 2022-01-23 ENCOUNTER — Encounter: Payer: Self-pay | Admitting: Family Medicine

## 2022-01-23 ENCOUNTER — Ambulatory Visit (INDEPENDENT_AMBULATORY_CARE_PROVIDER_SITE_OTHER): Payer: Medicare Other | Admitting: Family Medicine

## 2022-01-23 VITALS — BP 138/78 | HR 67 | Temp 97.8°F | Resp 16 | Ht 63.0 in | Wt 180.6 lb

## 2022-01-23 DIAGNOSIS — G8929 Other chronic pain: Secondary | ICD-10-CM

## 2022-01-23 DIAGNOSIS — F329 Major depressive disorder, single episode, unspecified: Secondary | ICD-10-CM | POA: Diagnosis not present

## 2022-01-23 DIAGNOSIS — F419 Anxiety disorder, unspecified: Secondary | ICD-10-CM | POA: Diagnosis not present

## 2022-01-23 DIAGNOSIS — M5442 Lumbago with sciatica, left side: Secondary | ICD-10-CM | POA: Diagnosis not present

## 2022-01-23 MED ORDER — IBUPROFEN 800 MG PO TABS: 800.0000 mg | ORAL_TABLET | Freq: Every day | ORAL | 1 refills | Status: DC | PRN

## 2022-01-23 MED ORDER — ESCITALOPRAM OXALATE 10 MG PO TABS: ORAL_TABLET | ORAL | 1 refills | Status: DC

## 2022-01-23 NOTE — Progress Notes (Signed)
I,Joseline E Rosas,acting as a scribe for Lelon Huh, MD.,have documented all relevant documentation on the behalf of Lelon Huh, MD,as directed by  Lelon Huh, MD while in the presence of Lelon Huh, MD.   Established patient visit   Patient: [name]   DOB: 20-Jul-1945   76 y.o. Female  MRN: [id number] Visit Date: 01/23/2022  Today's healthcare provider: Lelon Huh, MD   Chief Complaint  Patient presents with   Anxiety   Subjective    Anxiety Presents for follow-up (worsening for the past 2 months) visit. Symptoms include confusion ("some" age related per patient), decreased concentration, depressed mood, dizziness, excessive worry, irritability, nausea, nervous/anxious behavior and panic. Patient reports no compulsions, feeling of choking, insomnia, muscle tension or suicidal ideas. Symptoms occur most days. The severity of symptoms is moderate and causing significant distress. The patient sleeps 7 hours per night. The quality of sleep is good. Nighttime awakenings: one to two.   Compliance with medications is 76-100% (Patient on Xanax).        01/23/2022   11:32 AM  GAD 7 : Generalized Anxiety Score  Nervous, Anxious, on Edge 3  Control/stop worrying 3  Worry too much - different things 3  Trouble relaxing 2  Restless 0  Easily annoyed or irritable 3  Afraid - awful might happen 3  Total GAD 7 Score 17  Anxiety Difficulty Very difficult        01/23/2022   11:34 AM 12/11/2021   10:55 AM 10/23/2021   12:48 PM  Depression screen PHQ 2/9  Decreased Interest 0 0 3  Down, Depressed, Hopeless '3 2 3  '$ PHQ - 2 Score '3 2 6  '$ Altered sleeping 0 0 0  Tired, decreased energy '3 3 3  '$ Change in appetite 3 1 0  Feeling bad or failure about yourself  '3 2 2  '$ Trouble concentrating 0 1 1  Moving slowly or fidgety/restless 0 0 0  Suicidal thoughts 0 0 1  PHQ-9 Score '12 9 13  '$ Difficult doing work/chores Somewhat difficult Not difficult at all     She has  previously taken citalopram and venlafaxine for depression and anxiety, but stopped because she didn't like the way they made her feel, and didn't feel like they were helping. She has an acquaintance that takes Lexapro which has been helpful, and patient would like to try.   Medications: Outpatient Medications Prior to Visit  Medication Sig   albuterol (VENTOLIN HFA) 108 (90 Base) MCG/ACT inhaler TAKE 2 PUFFS BY MOUTH EVERY 6 HOURS AS NEEDED FOR WHEEZE OR SHORTNESS OF BREATH   albuterol (VENTOLIN HFA) 108 (90 Base) MCG/ACT inhaler Inhale into the lungs.   ALPRAZolam (XANAX) 0.5 MG tablet TAKE 1 TABLET BY MOUTH THREE TIMES DAILY AS NEEDED FOR ANXIETY/ SLEEP   aspirin EC 81 MG tablet Take 81 mg by mouth daily.   benzonatate (TESSALON) 200 MG capsule Take 1 capsule (200 mg total) by mouth 2 (two) times daily as needed for cough.   clotrimazole (LOTRIMIN) 1 % cream Apply 1 application topically 2 (two) times daily.   clotrimazole (MYCELEX) 10 MG troche Take 1 tablet (10 mg total) by mouth 5 (five) times daily.   fluticasone (FLONASE) 50 MCG/ACT nasal spray PLACE 1-2 SPRAYS INTO BOTH NOSTRILS DAILY AS NEEDED FOR ALLERGIES OR RHINITIS.   HYDROcodone bit-homatropine (HYCODAN) 5-1.5 MG/5ML syrup Take 5 mLs by mouth every 8 (eight) hours as needed for cough.   ibuprofen (ADVIL) 800 MG tablet  TAKE 1 TABLET (800 MG TOTAL) BY MOUTH DAILY AS NEEDED (SEVERE PAIN).   omeprazole (PRILOSEC) 20 MG capsule Take 20 mg by mouth daily as needed (acid reflux).    omeprazole (PRILOSEC) 40 MG capsule Take 1 capsule (40 mg total) by mouth daily.   rosuvastatin (CRESTOR) 40 MG tablet Take 1 tablet (40 mg total) by mouth daily.   Spacer/Aero-Holding Chambers (EASIVENT) inhaler Use as needed with inhaler   Spacer/Aero-Holding Chambers (EQ SPACE CHAMBER ANTI-STATIC) DEVI Inhale into the lungs as needed.   No facility-administered medications prior to visit.    Review of Systems  Constitutional:  Positive for  irritability.  Gastrointestinal:  Positive for nausea.  Neurological:  Positive for dizziness.  Psychiatric/Behavioral:  Positive for confusion ("some" age related per patient) and decreased concentration. Negative for suicidal ideas. The patient is nervous/anxious. The patient does not have insomnia.        Objective    BP 138/78 (BP Location: Right Arm, Patient Position: Sitting, Cuff Size: Normal)   Pulse 67   Temp 97.8 F (36.6 C)   Resp 16   Ht '5\' 3"'$  (1.6 m)   Wt 180 lb 9.6 oz (81.9 kg)   BMI 31.99 kg/m    Physical Exam   General appearance: Obese female, cooperative and in no acute distress Head: Normocephalic, without obvious abnormality, atraumatic Respiratory: Respirations even and unlabored, normal respiratory rate Extremities: All extremities are intact.  Skin: Skin color, texture, turgor normal. No rashes seen  Psych: Appropriate mood and affect. Neurologic: Mental status: Alert, oriented to person, place, and time, thought content appropriate.   Assessment & Plan     1. Anxiety Continue alprazolam  2. Major depressive disorder with single episode, remission status unspecified Worse since stopping citalopram, but she doesn't feel like it was helping and didn't like the way it made her feel.   She is interested in trial of  escitalopram (LEXAPRO) 10 MG tablet; Take 1/2 tablet every day for 6 days, then increase to 1 tablet every  Dispense: 30 tablet; Refill: 1   3. Chronic bilateral low back pain with left-sided sciatica She requests refill for  ibuprofen (ADVIL) 800 MG tablet; Take 1 tablet (800 mg total) by mouth daily as needed (severe pain).  Dispense: 30 tablet; Refill:   Future Appointments  Date Time Provider [place]  02/27/2022 10:00 AM Birdie Sons, MD [facility]  06/19/2022  1:30 PM [facility]-NURSE HEALTH ADVISOR [facility]        The entirety of the information documented in the History of Present Illness, Review of Systems and Physical  Exam were personally obtained by me. Portions of this information were initially documented by the CMA and reviewed by me for thoroughness and accuracy.     Lelon Huh, MD  [facility] [phone] (phone) [phone] (fax)  [place]

## 2022-02-01 ENCOUNTER — Other Ambulatory Visit: Payer: Self-pay | Admitting: Family Medicine

## 2022-02-01 DIAGNOSIS — F419 Anxiety disorder, unspecified: Secondary | ICD-10-CM

## 2022-02-01 NOTE — Telephone Encounter (Signed)
Requested medication (s) are due for refill today - yes  Requested medication (s) are on the active medication list -yes  Future visit scheduled -yes  Last refill: 10/02/21 #90 3RF  Notes to clinic: non delegated Rx  Requested Prescriptions  Pending Prescriptions Disp Refills   ALPRAZolam (XANAX) 0.5 MG tablet [Pharmacy Med Name: ALPRAZolam 0.5 MG Oral Tablet] 90 tablet 0    Sig: TAKE 1 TABLET BY MOUTH THREE TIMES DAILY AS NEEDED FOR ANXIETY/SLEEP     Not Delegated - Psychiatry: Anxiolytics/Hypnotics 2 Failed - 02/01/2022  7:17 AM      Failed - This refill cannot be delegated      Failed - Urine Drug Screen completed in last 360 days      Passed - Patient is not pregnant      Passed - Valid encounter within last 6 months    Recent Outpatient Visits           1 week ago [place], Donald E, MD   1 month ago Acute cough   [facility] [place], [place], PA-C   2 months ago Acute non-recurrent frontal sinusitis   [place], MD   3 months ago Obesity (BMI 30.0-34.9)   [facility] [place], [place], PA-C   7 months ago Dry mouth   [facility] [place], [place], [place]       Future Appointments             In 3 weeks [name], [name], MD [facility], [facility]               Requested Prescriptions  Pending Prescriptions Disp Refills   ALPRAZolam Duanne Moron) 0.5 MG tablet [Pharmacy Med Name: ALPRAZolam 0.5 MG Oral Tablet] 90 tablet 0    Sig: TAKE 1 TABLET BY MOUTH THREE TIMES DAILY AS NEEDED FOR ANXIETY/SLEEP     Not Delegated - Psychiatry: Anxiolytics/Hypnotics 2 Failed - 02/01/2022  7:17 AM      Failed - This refill cannot be delegated      Failed - Urine Drug Screen completed in last 360 days      Passed - Patient is not pregnant      Passed - Valid encounter within last 6 months    Recent Outpatient Visits           1 week ago [place], Donald E, MD   1 month ago Acute cough   [facility] [place], [place], PA-C   2 months ago Acute non-recurrent frontal sinusitis   [place], MD   3 months ago Obesity (BMI 30.0-34.9)   [facility] [place], [place], PA-C   7 months ago Dry mouth   [facility] [place], [place], [place]       Future Appointments             In 3 weeks [name], [name], MD [facility], [place]

## 2022-02-18 IMAGING — CR DG ANKLE COMPLETE 3+V*R*
1 series · 3 of 3 positions shown · non-contrast
Comparison: None.

CLINICAL DATA: Fall on stairs today.  Ankle pain and swelling.

EXAM:
RIGHT ANKLE - COMPLETE 3+ VIEW

[Series 1: x ankle ap right · 0.14mm/px · 3 of 3 slices shown]
[im 1/3]
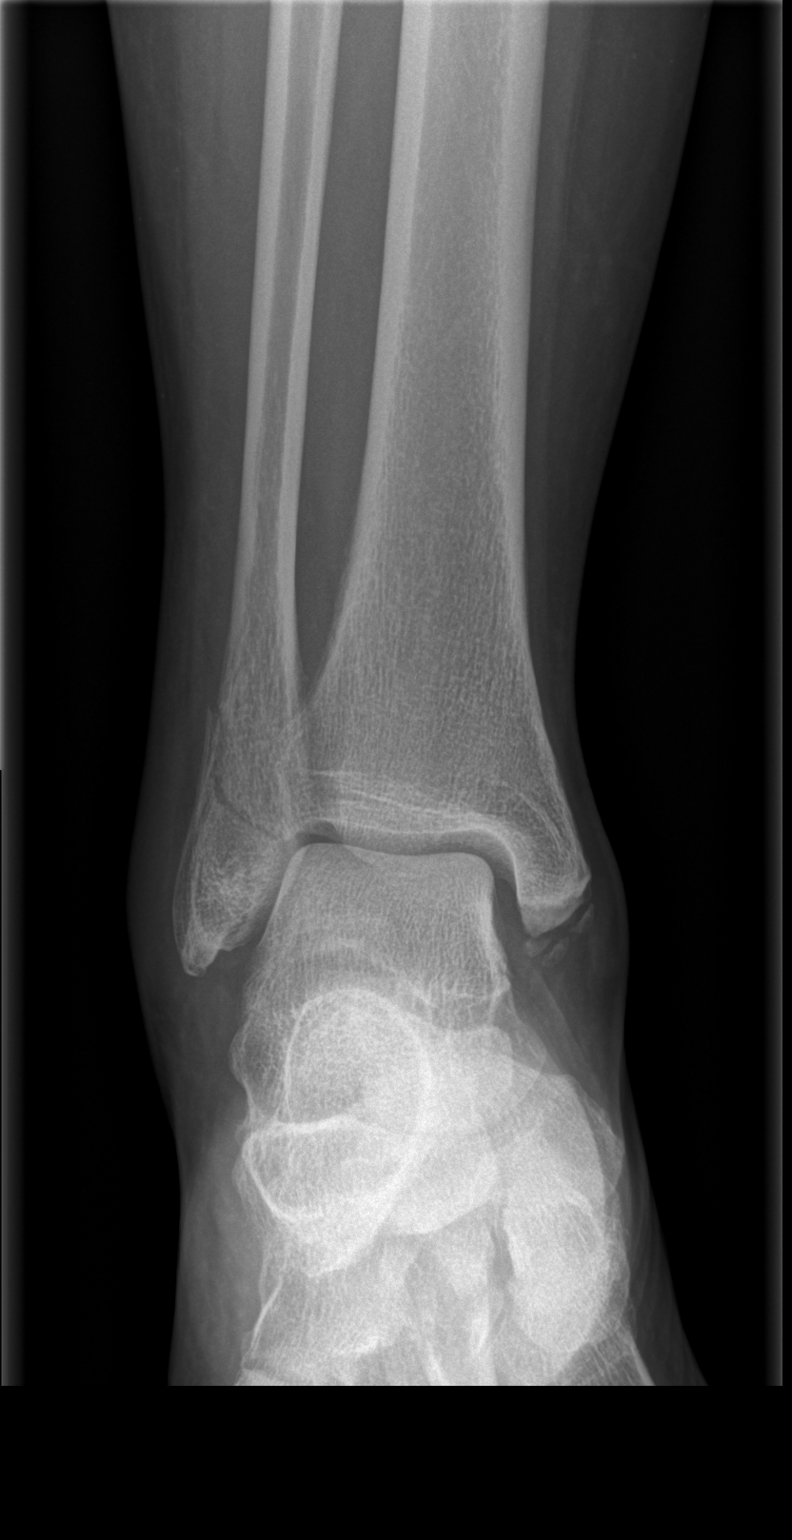
[im 2/3]
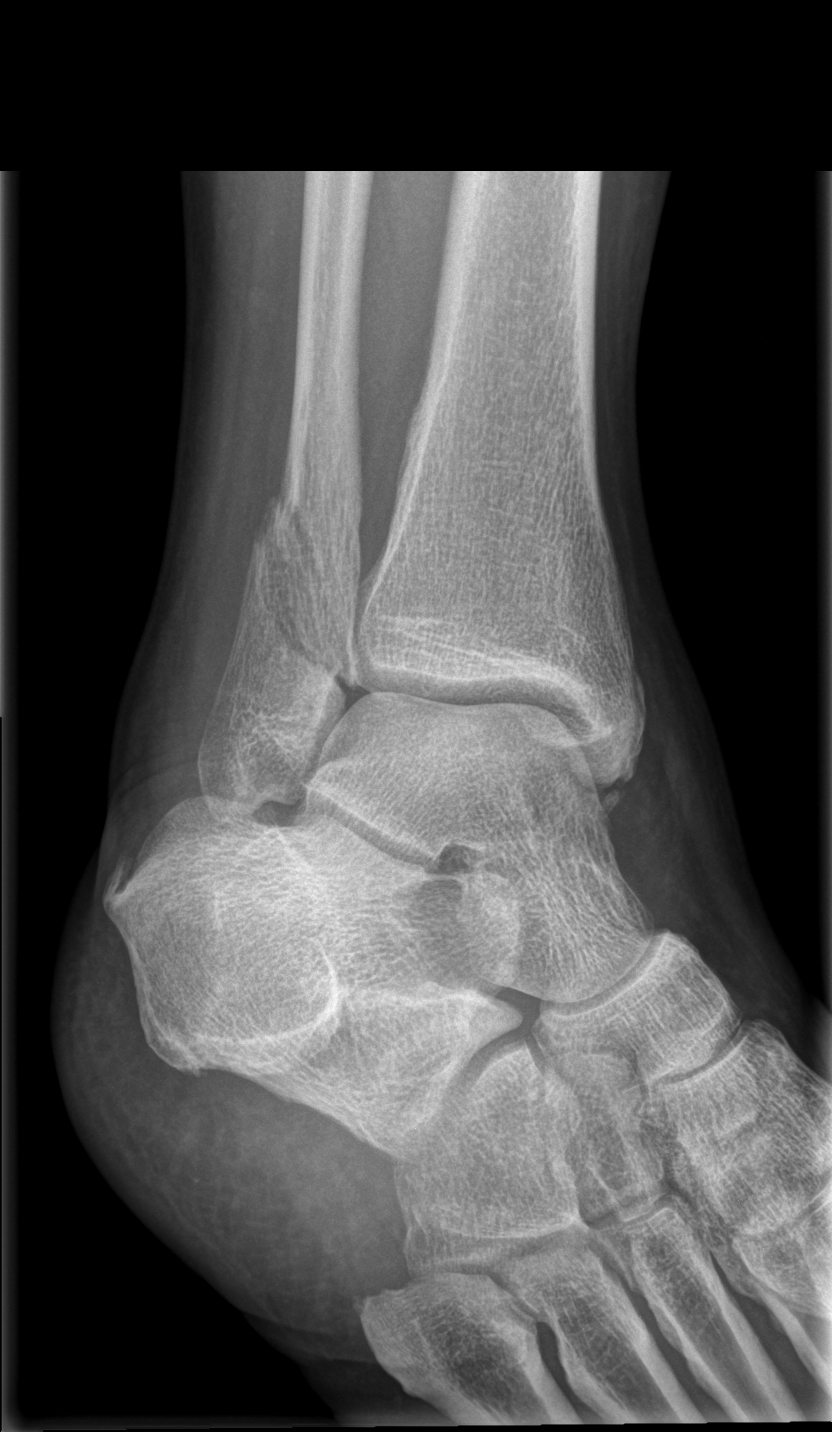
[im 3/3]
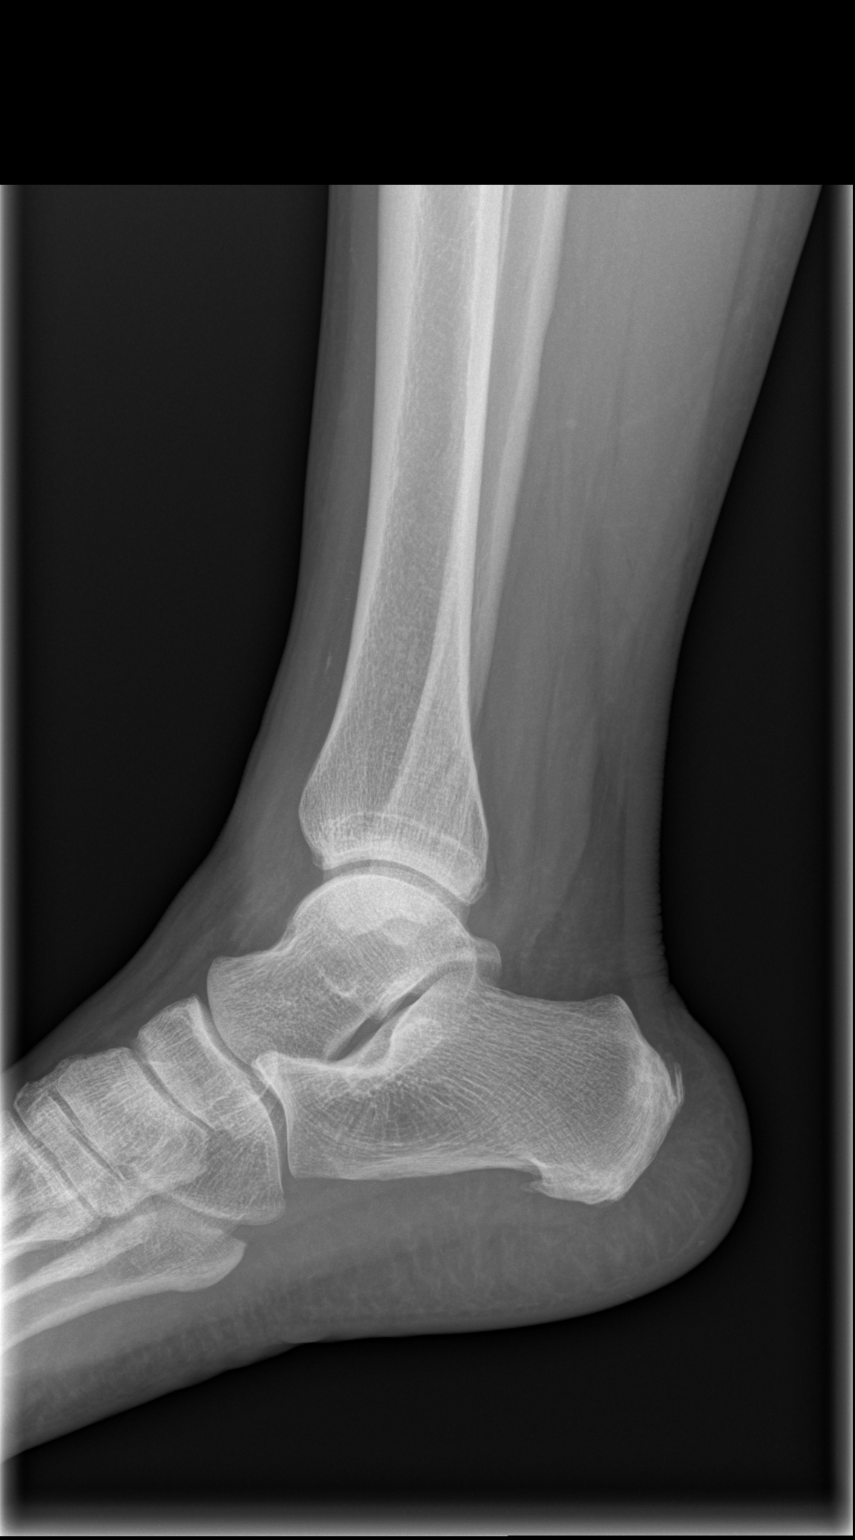

[3 of 3 positions shown; findings below may reference images not displayed]

FINDINGS: Minimally displaced oblique fracture of the distal fibula is seen
just above the level of the tibial plafond and. Tiny avulsion
fracture fragments are also seen adjacent to the medial malleolus.
No evidence of dislocation.
IMPRESSION: Minimally displaced oblique fracture of the distal fibula.

Tiny avulsion fracture fragments adjacent to the medial malleolus.

## 2022-02-26 NOTE — Progress Notes (Deleted)
Established patient visit   Patient: [name]   DOB: Jan 01, 1946   77 y.o. Female  MRN: [id number] Visit Date: 02/27/2022  Today's healthcare provider: Lelon Huh, MD   No chief complaint on file.  Subjective    HPI  Anxiety, Follow-up  She was last seen for anxiety 1 months ago. Changes made at last visit include continue current treatment   She reports {excellent/good/fair/poor:19665} compliance with treatment. She reports {good/fair/poor:18685} tolerance of treatment. She {is/is not:21021397} having side effects. {document side effects if present:1}  She feels her anxiety is {Desc; severity:60313} and {improved/worse/unchanged:3041574} since last visit.  Symptoms: {Yes/No:20286} chest pain {Yes/No:20286} difficulty concentrating  {Yes/No:20286} dizziness {Yes/No:20286} fatigue  {Yes/No:20286} feelings of losing control {Yes/No:20286} insomnia  {Yes/No:20286} irritable {Yes/No:20286} palpitations  {Yes/No:20286} panic attacks {Yes/No:20286} racing thoughts  {Yes/No:20286} shortness of breath {Yes/No:20286} sweating  {Yes/No:20286} tremors/shakes    GAD-7 Results    01/23/2022   11:32 AM  GAD-7 Generalized Anxiety Disorder Screening Tool  1. Feeling Nervous, Anxious, or on Edge 3  2. Not Being Able to Stop or Control Worrying 3  3. Worrying Too Much About Different Things 3  4. Trouble Relaxing 2  5. Being So Restless it's Hard To Sit Still 0  6. Becoming Easily Annoyed or Irritable 3  7. Feeling Afraid As If Something Awful Might Happen 3  Total GAD-7 Score 17  Difficulty At Work, Home, or Getting  Along With Others? Very difficult    PHQ-9 Scores    01/23/2022   11:34 AM 12/11/2021   10:55 AM 10/23/2021   12:48 PM  PHQ9 SCORE ONLY  PHQ-9 Total Score 12 9 13    $ ---------------------------------------------------------------------------------------------------   Medications: Outpatient Medications Prior to Visit  Medication Sig   albuterol  (VENTOLIN HFA) 108 (90 Base) MCG/ACT inhaler TAKE 2 PUFFS BY MOUTH EVERY 6 HOURS AS NEEDED FOR WHEEZE OR SHORTNESS OF BREATH   albuterol (VENTOLIN HFA) 108 (90 Base) MCG/ACT inhaler Inhale into the lungs.   ALPRAZolam (XANAX) 0.5 MG tablet TAKE 1 TABLET BY MOUTH THREE TIMES DAILY AS NEEDED FOR ANXIETY/SLEEP   aspirin EC 81 MG tablet Take 81 mg by mouth daily.   benzonatate (TESSALON) 200 MG capsule Take 1 capsule (200 mg total) by mouth 2 (two) times daily as needed for cough.   clotrimazole (LOTRIMIN) 1 % cream Apply 1 application topically 2 (two) times daily.   clotrimazole (MYCELEX) 10 MG troche Take 1 tablet (10 mg total) by mouth 5 (five) times daily.   escitalopram (LEXAPRO) 10 MG tablet Take 1/2 tablet every day for 6 days, then increase to 1 tablet every   fluticasone (FLONASE) 50 MCG/ACT nasal spray PLACE 1-2 SPRAYS INTO BOTH NOSTRILS DAILY AS NEEDED FOR ALLERGIES OR RHINITIS.   HYDROcodone bit-homatropine (HYCODAN) 5-1.5 MG/5ML syrup Take 5 mLs by mouth every 8 (eight) hours as needed for cough.   ibuprofen (ADVIL) 800 MG tablet Take 1 tablet (800 mg total) by mouth daily as needed (severe pain).   omeprazole (PRILOSEC) 20 MG capsule Take 20 mg by mouth daily as needed (acid reflux).    omeprazole (PRILOSEC) 40 MG capsule Take 1 capsule (40 mg total) by mouth daily.   rosuvastatin (CRESTOR) 40 MG tablet Take 1 tablet (40 mg total) by mouth daily.   Spacer/Aero-Holding Chambers (EASIVENT) inhaler Use as needed with inhaler   Spacer/Aero-Holding Chambers (EQ SPACE CHAMBER ANTI-STATIC) DEVI Inhale into the lungs as needed.   No facility-administered medications prior to visit.  Review of Systems  {Labs  Heme  Chem  Endocrine  Serology  Results Review (optional):23779}   Objective    There were no vitals taken for this visit. {Show previous vital signs (optional):23777}  Physical Exam  ***  No results found for any visits on 02/27/22.  Assessment & Plan     ***  No  follow-ups on file.      {provider attestation***:1}   Lelon Huh, MD  [facility] [phone] (phone) [phone] (fax)  [place]

## 2022-02-27 ENCOUNTER — Ambulatory Visit: Payer: Medicare Other | Admitting: Family Medicine

## 2022-05-11 DIAGNOSIS — Z1152 Encounter for screening for COVID-19: Secondary | ICD-10-CM | POA: Diagnosis not present

## 2022-05-11 DIAGNOSIS — K219 Gastro-esophageal reflux disease without esophagitis: Secondary | ICD-10-CM | POA: Diagnosis not present

## 2022-05-11 DIAGNOSIS — E785 Hyperlipidemia, unspecified: Secondary | ICD-10-CM | POA: Diagnosis not present

## 2022-05-11 DIAGNOSIS — K439 Ventral hernia without obstruction or gangrene: Secondary | ICD-10-CM | POA: Diagnosis not present

## 2022-05-11 DIAGNOSIS — R531 Weakness: Secondary | ICD-10-CM | POA: Diagnosis not present

## 2022-05-11 DIAGNOSIS — K5900 Constipation, unspecified: Secondary | ICD-10-CM | POA: Diagnosis not present

## 2022-05-11 DIAGNOSIS — I10 Essential (primary) hypertension: Secondary | ICD-10-CM | POA: Diagnosis not present

## 2022-05-11 DIAGNOSIS — D7389 Other diseases of spleen: Secondary | ICD-10-CM | POA: Diagnosis not present

## 2022-05-11 DIAGNOSIS — Z20822 Contact with and (suspected) exposure to covid-19: Secondary | ICD-10-CM | POA: Diagnosis not present

## 2022-05-11 DIAGNOSIS — Z880 Allergy status to penicillin: Secondary | ICD-10-CM | POA: Diagnosis not present

## 2022-05-14 ENCOUNTER — Telehealth: Payer: Self-pay

## 2022-05-14 NOTE — Telephone Encounter (Signed)
Copied from [place] [id number]. Topic: Appointment Scheduling - Scheduling Inquiry for Clinic >> May 14, 2022 12:47 PM [name] wrote: Reason for CRM: Pt is requesting a hospital follow-up appointment; no appointments are available within the next 7-14 days. Pt was discharged 03/23 from [facility] ED.  Pt would like to see her PCP only Dr.[name].   Please advise

## 2022-05-14 NOTE — Telephone Encounter (Signed)
You can always use same day slots for hospital follow up appts. Thanks!

## 2022-05-20 ENCOUNTER — Ambulatory Visit: Payer: Medicare Other | Attending: Family Medicine

## 2022-05-20 ENCOUNTER — Ambulatory Visit (INDEPENDENT_AMBULATORY_CARE_PROVIDER_SITE_OTHER): Payer: Medicare Other | Admitting: Family Medicine

## 2022-05-20 VITALS — BP 145/85 | HR 70 | Temp 97.9°F | Wt 182.0 lb

## 2022-05-20 DIAGNOSIS — K5900 Constipation, unspecified: Secondary | ICD-10-CM

## 2022-05-20 DIAGNOSIS — M5442 Lumbago with sciatica, left side: Secondary | ICD-10-CM

## 2022-05-20 DIAGNOSIS — R42 Dizziness and giddiness: Secondary | ICD-10-CM

## 2022-05-20 DIAGNOSIS — G8929 Other chronic pain: Secondary | ICD-10-CM | POA: Diagnosis not present

## 2022-05-20 DIAGNOSIS — D7389 Other diseases of spleen: Secondary | ICD-10-CM

## 2022-05-20 DIAGNOSIS — R001 Bradycardia, unspecified: Secondary | ICD-10-CM

## 2022-05-20 MED ORDER — HYDROCODONE-ACETAMINOPHEN 5-325 MG PO TABS: 1.0000 | ORAL_TABLET | ORAL | 0 refills | Status: AC | PRN

## 2022-05-20 MED ORDER — LACTULOSE 10 GM/15ML PO SOLN: 30.0000 g | Freq: Every day | ORAL | 0 refills | Status: DC | PRN

## 2022-05-20 MED ORDER — IBUPROFEN 800 MG PO TABS: 800.0000 mg | ORAL_TABLET | Freq: Every day | ORAL | 1 refills | Status: DC | PRN

## 2022-05-20 NOTE — Progress Notes (Signed)
Established patient visit   Patient: [name]   DOB: [birth year]   77 y.o. Female  MRN: [id number] Visit Date: 05/20/2022  Today's healthcare provider: Lelon Huh, MD   No chief complaint on file.  Subjective    HPI  Follow up Hospitalization  Patient was seen at [facility] ER on 05/11/22. She was treated for generalized weakness and abdominal discomfort. She states stools have been small hard and frequent. Treatment for this included supplements and laxatives for constipation.  CT revealed indeterminate lesions in the spleen and it was recommended this be followed with PCP. CBC and met C were unremarkable.  Telephone follow up was done on 05/14/22 She reports good compliance with treatment. She reports this condition is stayed the same. Her primary complaint is that she feels light headed, especially when standing. Goes away when lies down. No spinning sensations.  -----------------------------------------------------------------------------------------   Medications: Outpatient Medications Prior to Visit  Medication Sig   albuterol (VENTOLIN HFA) 108 (90 Base) MCG/ACT inhaler TAKE 2 PUFFS BY MOUTH EVERY 6 HOURS AS NEEDED FOR WHEEZE OR SHORTNESS OF BREATH   albuterol (VENTOLIN HFA) 108 (90 Base) MCG/ACT inhaler Inhale into the lungs.   ALPRAZolam (XANAX) 0.5 MG tablet TAKE 1 TABLET BY MOUTH THREE TIMES DAILY AS NEEDED FOR ANXIETY/SLEEP   aspirin EC 81 MG tablet Take 81 mg by mouth daily.   benzonatate (TESSALON) 200 MG capsule Take 1 capsule (200 mg total) by mouth 2 (two) times daily as needed for cough.   clotrimazole (LOTRIMIN) 1 % cream Apply 1 application topically 2 (two) times daily.   clotrimazole (MYCELEX) 10 MG troche Take 1 tablet (10 mg total) by mouth 5 (five) times daily.   escitalopram (LEXAPRO) 10 MG tablet Take 1/2 tablet every day for 6 days, then increase to 1 tablet every   fluticasone (FLONASE) 50 MCG/ACT nasal spray PLACE 1-2 SPRAYS INTO BOTH  NOSTRILS DAILY AS NEEDED FOR ALLERGIES OR RHINITIS.   HYDROcodone bit-homatropine (HYCODAN) 5-1.5 MG/5ML syrup Take 5 mLs by mouth every 8 (eight) hours as needed for cough.   ibuprofen (ADVIL) 800 MG tablet Take 1 tablet (800 mg total) by mouth daily as needed (severe pain).   omeprazole (PRILOSEC) 20 MG capsule Take 20 mg by mouth daily as needed (acid reflux).    omeprazole (PRILOSEC) 40 MG capsule Take 1 capsule (40 mg total) by mouth daily.   rosuvastatin (CRESTOR) 40 MG tablet Take 1 tablet (40 mg total) by mouth daily.   Spacer/Aero-Holding Chambers (EASIVENT) inhaler Use as needed with inhaler   Spacer/Aero-Holding Chambers (EQ SPACE CHAMBER ANTI-STATIC) DEVI Inhale into the lungs as needed.   No facility-administered medications prior to visit.    Review of Systems  Gastrointestinal:  Negative for abdominal pain and constipation.  Neurological:  Positive for weakness and light-headedness.       Objective    BP (!) 145/85 (BP Location: Left Arm, Patient Position: Sitting, Cuff Size: Normal)   Pulse 70   Temp 97.9 F (36.6 C) (Oral)   Wt 182 lb (82.6 kg)   SpO2 99%   BMI 32.24 kg/m  Orthostatic Vitals for the past 48 hrs (Last 6 readings):  Patient Position Orthostatic BP Orthostatic Pulse BP Pulse BP Location Cuff Size  05/20/22 1052 Sitting -- -- (!) 145/85 70 Left Arm Normal  05/20/22 1119 Supine 151/83 62 -- -- Right Arm --  05/20/22 1120 Sitting 150/80 63 -- -- -- --  05/20/22 1121 Standing 151/87 66 -- -- -- --  Physical Exam   General: Appearance:    Mildly obese female in no acute distress  Eyes:    PERRL, conjunctiva/corneas clear, EOM's intact       Lungs:     Clear to auscultation bilaterally, respirations unlabored  Heart:    Normal heart rate. Normal rhythm. No murmurs, rubs, or gallops.    MS:   All extremities are intact.    Neurologic:   Awake, alert, oriented x 3. No apparent focal neurological defect.        EKG: Sinus brady cardia rate =58    Assessment & Plan     1. Splenic lesion Per recent abdominal CT at [facility] ER - MR ABDOMEN LIMITED; Future  2. Constipation, unspecified constipation type Is already taking miralax. Will add prn lactulose (CHRONULAC) 10 GM/15ML solution; Take 45 mLs (30 g total) by mouth daily as needed for mild constipation.  Dispense: 236 mL; Refill: 0  3. Dizziness  - LONG TERM MONITOR (3-14 DAYS); Future - EKG 12-Lead  4. Bradycardia  - EKG 12-Lead  5. Chronic bilateral low back pain with left-sided sciatica Refill  - ibuprofen (ADVIL) 800 MG tablet; Take 1 tablet (800 mg total) by mouth daily as needed (severe pain).  Dispense: 30 tablet; Refill: 1 And - HYDROcodone-acetaminophen (NORCO/VICODIN) 5-325 MG tablet; Take 1 tablet by mouth every 4 (four) hours as needed for up to 7 days for moderate pain.  Dispense: 15 tablet; Refill: 0   Addressed extensive list of chronic and acute medical problems today requiring 50 minutes reviewing her medical record, counseling patient regarding her conditions and coordination of care.        The entirety of the information documented in the History of Present Illness, Review of Systems and Physical Exam were personally obtained by me. Portions of this information were initially documented by the CMA and reviewed by me for thoroughness and accuracy.     Lelon Huh, MD  [place] [phone] (phone) [phone] (fax)  [place]

## 2022-05-22 ENCOUNTER — Telehealth: Payer: Self-pay | Admitting: Family Medicine

## 2022-05-22 DIAGNOSIS — R42 Dizziness and giddiness: Secondary | ICD-10-CM

## 2022-05-22 MED ORDER — PROMETHAZINE HCL 12.5 MG PO TABS: 12.5000 mg | ORAL_TABLET | Freq: Three times a day (TID) | ORAL | 0 refills | Status: DC | PRN

## 2022-05-22 NOTE — Telephone Encounter (Signed)
Pt is calling in requesting Dr. [name] send in medication for sever nausea. Pt says she was seen in office yesterday. She would like that sent to [place] ([place]), [place]. Please advise.

## 2022-05-28 ENCOUNTER — Ambulatory Visit: Admission: RE | Admit: 2022-05-28 | Payer: Medicare Other | Source: Ambulatory Visit

## 2022-06-05 ENCOUNTER — Telehealth: Payer: Self-pay | Admitting: Family Medicine

## 2022-06-05 NOTE — Telephone Encounter (Signed)
Pt is calling for the results of her ZO patch to monitor her heart. Please advise [phone]

## 2022-06-05 NOTE — Telephone Encounter (Signed)
Should be back by now, please contact [facility] to see what's taking so long.

## 2022-06-07 DIAGNOSIS — R42 Dizziness and giddiness: Secondary | ICD-10-CM | POA: Diagnosis not present

## 2022-06-12 ENCOUNTER — Telehealth: Payer: Self-pay

## 2022-06-12 ENCOUNTER — Other Ambulatory Visit: Payer: Self-pay | Admitting: Family Medicine

## 2022-06-12 DIAGNOSIS — I471 Supraventricular tachycardia, unspecified: Secondary | ICD-10-CM

## 2022-06-12 MED ORDER — DILTIAZEM HCL ER COATED BEADS 120 MG PO CP24: 120.0000 mg | ORAL_CAPSULE | Freq: Every day | ORAL | 0 refills | Status: DC

## 2022-06-12 NOTE — Telephone Encounter (Signed)
Copied from CRM [id number]. Topic: General - Inquiry >> Jun 12, 2022  8:58 AM [name] wrote: Reason for CRM: patient said she wore a heart monitor for 7 days and she hadn'Michael heard anything by Monday 4/22 to call. Please contact patient to advise.

## 2022-06-12 NOTE — Telephone Encounter (Signed)
Heart monitor shows episodes of heart racing which could be causing dizziness. Recommend starting diltiazem CD  once a day. Have sent prescription to her pharmacy. Please schedule follow up office visit. 3-4 weeks.

## 2022-06-17 ENCOUNTER — Other Ambulatory Visit: Payer: Self-pay | Admitting: Family Medicine

## 2022-06-17 NOTE — Telephone Encounter (Signed)
Medication Refill - Medication: promethazine (PHENERGAN) 12.5 MG tablet   Pt stated this is not a new symptom.   Has the patient contacted their pharmacy? No. No, more refills.  (Agent: If no, request that the patient contact the pharmacy for the refill. If patient does not wish to contact the pharmacy document the reason why and proceed with request.)   Preferred Pharmacy (with phone number or street name):  [facility] [address]), [place] - [address]  [address]) [place] [address]  Phone: [phone] Fax: [phone]  Hours: Not open 24 hours   Has the patient been seen for an appointment in the last year OR does the patient have an upcoming appointment? Yes.    Agent: Please be advised that RX refills may take up to 3 business days. We ask that you follow-up with your pharmacy.

## 2022-06-18 ENCOUNTER — Other Ambulatory Visit: Payer: Self-pay | Admitting: Family Medicine

## 2022-06-18 DIAGNOSIS — F419 Anxiety disorder, unspecified: Secondary | ICD-10-CM

## 2022-06-18 MED ORDER — PROMETHAZINE HCL 12.5 MG PO TABS: 12.5000 mg | ORAL_TABLET | Freq: Three times a day (TID) | ORAL | 0 refills | Status: DC | PRN

## 2022-06-18 NOTE — Telephone Encounter (Signed)
Requested medication (s) are due for refill today: yes  Requested medication (s) are on the active medication list: yes  Last refill:  05/22/22 #20 0 refills  Future visit scheduled: yes in 3 weeks   Notes to clinic:  not delegated per protocol. Patient requesting refill , do you want to refill Rx?     Requested Prescriptions  Pending Prescriptions Disp Refills   promethazine (PHENERGAN) 12.5 MG tablet 20 tablet 0    Sig: Take 1-2 tablets (12.5-25 mg total) by mouth every 8 (eight) hours as needed for nausea or vomiting.     Not Delegated - Gastroenterology: Antiemetics Failed - 06/17/2022  1:18 PM      Failed - This refill cannot be delegated      Passed - Valid encounter within last 6 months    Recent Outpatient Visits           4 weeks ago Splenic lesion   [facility] Malva Limes, MD   4 months ago Anxiety   [facility] [place] Malva Limes, MD   6 months ago Acute cough   [facility] [place], [place], [name]   7 months ago Acute non-recurrent frontal sinusitis   [facility] Malva Limes, MD   8 months ago Obesity (BMI 30.0-34.9)    [facility] [place], [place], [name]       Future Appointments             In 3 weeks [name], [name], MD [facility], [facility]

## 2022-06-19 ENCOUNTER — Other Ambulatory Visit: Payer: Self-pay | Admitting: Family Medicine

## 2022-06-19 ENCOUNTER — Ambulatory Visit (INDEPENDENT_AMBULATORY_CARE_PROVIDER_SITE_OTHER): Payer: Medicare Other

## 2022-06-19 VITALS — Ht 63.0 in | Wt 182.0 lb

## 2022-06-19 DIAGNOSIS — Z0000 Encounter for general adult medical examination without abnormal findings: Secondary | ICD-10-CM

## 2022-06-19 DIAGNOSIS — F419 Anxiety disorder, unspecified: Secondary | ICD-10-CM

## 2022-06-19 NOTE — Progress Notes (Signed)
I connected with  Jeanne Ivan on 06/19/22 by a audio enabled telemedicine application and verified that I am speaking with the correct person using two identifiers.  Patient Location: Home  Provider Location: Office/Clinic  I discussed the limitations of evaluation and management by telemedicine. The patient expressed understanding and agreed to proceed.  Subjective:   [name] is a 77 y.o. female who presents for Medicare Annual (Subsequent) preventive examination.  Review of Systems    Cardiac Risk Factors include: advanced age (>94men, >77 women);dyslipidemia;hypertension;obesity (BMI >30kg/m2)    Objective:    Today's Vitals   06/19/22 1332  Weight: 182 lb (82.6 kg)  Height: 5\' 3"  (1.6 m)   Body mass index is 32.24 kg/m.     06/19/2022    1:44 PM 06/14/2021    2:00 PM 08/23/2020    3:31 PM 04/02/2020    2:56 AM 11/08/2019    4:02 PM 10/29/2019   12:10 PM 10/27/2019    9:18 AM  Advanced Directives  Does Patient Have a Medical Advance Directive? Yes No No No Yes Yes Yes  Type of Estate agent of [place];Living will    Living will Living will Healthcare Power of Attorney  Does patient want to make changes to medical advance directive?     No - Patient declined    Would patient like information on creating a medical advance directive?  No - Patient declined No - Patient declined No - Patient declined       Current Medications (verified) Outpatient Encounter Medications as of 06/19/2022  Medication Sig   albuterol (VENTOLIN HFA) 108 (90 Base) MCG/ACT inhaler TAKE 2 PUFFS BY MOUTH EVERY 6 HOURS AS NEEDED FOR WHEEZE OR SHORTNESS OF BREATH   albuterol (VENTOLIN HFA) 108 (90 Base) MCG/ACT inhaler Inhale into the lungs.   aspirin EC 81 MG tablet Take 81 mg by mouth daily.   clotrimazole (LOTRIMIN) 1 % cream Apply 1 application topically 2 (two) times daily.   clotrimazole (MYCELEX) 10 MG troche Take 1 tablet (10 mg total) by mouth 5 (five) times daily.    diltiazem (CARDIZEM CD) 120 MG 24 hr capsule Take 1 capsule (120 mg total) by mouth daily.   fluticasone (FLONASE) 50 MCG/ACT nasal spray PLACE 1-2 SPRAYS INTO BOTH NOSTRILS DAILY AS NEEDED FOR ALLERGIES OR RHINITIS.   ibuprofen (ADVIL) 800 MG tablet Take 1 tablet (800 mg total) by mouth daily as needed (severe pain).   lactulose (CHRONULAC) 10 GM/15ML solution Take 45 mLs (30 g total) by mouth daily as needed for mild constipation.   omeprazole (PRILOSEC) 20 MG capsule Take 20 mg by mouth daily as needed (acid reflux).    omeprazole (PRILOSEC) 40 MG capsule Take 1 capsule (40 mg total) by mouth daily.   promethazine (PHENERGAN) 12.5 MG tablet Take 1-2 tablets (12.5-25 mg total) by mouth every 8 (eight) hours as needed for nausea or vomiting.   rosuvastatin (CRESTOR) 40 MG tablet Take 1 tablet (40 mg total) by mouth daily.   Spacer/Aero-Holding Chambers (EASIVENT) inhaler Use as needed with inhaler   Spacer/Aero-Holding Chambers (EQ SPACE CHAMBER ANTI-STATIC) DEVI Inhale into the lungs as needed.   [DISCONTINUED] ALPRAZolam (XANAX) 0.5 MG tablet TAKE 1 TABLET BY MOUTH THREE TIMES DAILY AS NEEDED FOR ANXIETY/SLEEP   No facility-administered encounter medications on file as of 06/19/2022.    Allergies (verified) Levofloxacin, Calcium channel blockers, Amoxicillin, Nickel, and Penicillins   History: Past Medical History:  Diagnosis Date   Abdominal hernia  Anxiety    Arthritis    lower spine   Back pain    lower back - S/P lifting injury   Complication of anesthesia    makes hair brittle   GERD (gastroesophageal reflux disease)    Hyperlipidemia    Hypertension    Neuromuscular disorder (HCC)    numbness legs and feet s/p lower back injury   Partial bowel obstruction (HCC) 02/04/2018   Stroke (HCC)    "mini - strokes" 2009 - no deficit   Vertigo    none recently   Vitamin D deficiency    Wears contact lenses    Past Surgical History:  Procedure Laterality Date   ABDOMINAL  HYSTERECTOMY  1987   CARDIAC CATHETERIZATION  02/2007   COLON SURGERY  03/12/2012   Dr Cecelia Byars   COLONOSCOPY WITH PROPOFOL N/A 09/26/2014   Procedure: COLONOSCOPY WITH PROPOFOL;  Surgeon: Midge Minium, MD;  [facility];  Service: Endoscopy;  Laterality: N/A;  marker (tattoo) used in colon   COLONOSCOPY WITH PROPOFOL N/A 10/27/2019   Procedure: COLONOSCOPY WITH PROPOFOL;  Surgeon: Earline Mayotte, MD;  Location: [facility] END[facility]COPY;  Service: Endoscopy;  Laterality: N/A;   ESOPHAGEAL DILATION N/A 02/02/2016   Procedure: ESOPHAGEAL DILATION;  Surgeon: Midge Minium, MD;  Location: [facility];  Service: Endoscopy;  Laterality: N/A;   ESOPHAGOGASTRODUODENOSCOPY (EGD) WITH PROPOFOL N/A 02/02/2016   Procedure: ESOPHAGOGASTRODUODENOSCOPY (EGD) WITH PROPOFOL;  Surgeon: Midge Minium, MD;  Location: [facility];  Service: Endoscopy;  Laterality: N/A;   LAPAROSCOPIC ABDOMINAL EXPLORATION N/A 11/08/2019   Procedure: LAPAROSCOPIC ABDOMINAL EXPLORATION;  Surgeon: Earline Mayotte, MD;  Location: [facility] ORS;  Service: General;  Laterality: N/A;  POSSIBLE LAPAROTOMY   POLYPECTOMY  09/26/2014   Procedure: POLYPECTOMY;  Surgeon: Midge Minium, MD;  Location: [facility];  Service: Endoscopy;;   TUBAL LIGATION  1972   VENTRAL HERNIA REPAIR N/A 11/08/2019   Procedure: HERNIA REPAIR VENTRAL ADULT;  Surgeon: Earline Mayotte, MD;  Location: [facility] ORS;  Service: General;  Laterality: N/A;  possible ventral hernia repair   Family History  Problem Relation Age of Onset   Diabetes Mother    Hypertension Mother    Mental illness Mother    Heart failure Mother 63   Cancer Father        lung cancer   Drug abuse Brother    Cancer Brother    Multiple sclerosis Brother    Social History   Socioeconomic History   Marital status: Married    Spouse name: Not on file   Number of children: 3   Years of education: Not on file   Highest education level: 12th grade  Occupational History    Occupation: retired  Tobacco Use   Smoking status: Never   Smokeless tobacco: Never  Vaping Use   Vaping Use: Never used  Substance and Sexual Activity   Alcohol use: No    Alcohol/week: 0.0 standard drinks of alcohol   Drug use: No   Sexual activity: Not Currently  Other Topics Concern   Not on file  Social History Narrative   Not on file   Social Determinants of Health   Financial Resource Strain: Low Risk  (06/19/2022)   Overall Financial Resource Strain (CARDIA)    Difficulty of Paying Living Expenses: Not very hard  Food Insecurity: No Food Insecurity (06/19/2022)   Hunger Vital Sign    Worried About Running Out of Food in the Last Year: Never true  Ran Out of Food in the Last Year: Never true  Transportation Needs: No Transportation Needs (06/19/2022)   PRAPARE - Administrator, Civil Service (Medical): No    Lack of Transportation (Non-Medical): No  Physical Activity: Insufficiently Active (06/19/2022)   Exercise Vital Sign    Days of Exercise per Week: 7 days    Minutes of Exercise per Session: 20 min  Stress: No Stress Concern Present (06/19/2022)   [employer] of Occupational Health - Occupational Stress Questionnaire    Feeling of Stress : Only a little  Social Connections: Moderately Integrated (06/19/2022)   Social Connection and Isolation Panel [NHANES]    Frequency of Communication with Friends and Family: More than three times a week    Frequency of Social Gatherings with Friends and Family: Three times a week    Attends Religious Services: More than 4 times per year    Active Member of Clubs or Organizations: No    Attends Banker Meetings: Never    Marital Status: Married    Tobacco Counseling Counseling given: Not Answered  Clinical Intake:  Pre-visit preparation completed: Yes  Pain : No/denies pain   BMI - recorded: 32.24 Nutritional Status: BMI > 30  Obese Nutritional Risks: None Diabetes: No  How often do you need  to have someone help you when you read instructions, pamphlets, or other written materials from your doctor or pharmacy?: 1 - Never  Diabetic?no  Interpreter Needed?: No  Comments: lives with husband Information entered by :: B.Sheetal Lyall,LPN   Activities of Daily Living    06/19/2022    1:44 PM 05/20/2022   10:50 AM  In your present state of health, do you have any difficulty performing the following activities:  Hearing? 1 1  Vision? 0 0  Difficulty concentrating or making decisions? 0 0  Walking or climbing stairs? 0 0  Dressing or bathing? 0 0  Doing errands, shopping? 0 0  Preparing Food and eating ? N   Using the Toilet? N   In the past six months, have you accidently leaked urine? N   Do you have problems with loss of bowel control? N   Managing your Medications? N   Managing your Finances? N   Housekeeping or managing your Housekeeping? N     Patient Care Team: Malva Limes, MD as PCP - General (Family Medicine) End, Cristal Deer, MD as PCP - Cardiology (Cardiology) Midge Minium, MD as Consulting Physician (Gastroenterology) Lemar Livings Merrily Pew, MD as Consulting Physician (General Surgery) Domingo Madeira, OD (Optometry)  Indicate any recent Medical Services you may have received from other than [facility] providers in the past year (date may be approximate).     Assessment:   This is a routine wellness examination for [place].  Hearing/Vision screen Hearing Screening - Comments:: Inadequate hearing_declines hearing test Vision Screening - Comments:: Adequate vision with glasses Dr Larence Penning  Dietary issues and exercise activities discussed: Current Exercise Habits: Home exercise routine, Type of exercise: walking, Time (Minutes): 20, Frequency (Times/Week): 7, Weekly Exercise (Minutes/Week): 140, Intensity: Mild, Exercise limited by: neurologic condition(s);orthopedic condition(s)   Goals Addressed   None    Depression Screen    06/19/2022    1:40 PM 05/20/2022   10:49  AM 01/23/2022   11:34 AM 12/11/2021   10:55 AM 10/23/2021   12:48 PM 10/15/2021    2:55 PM 06/14/2021    1:57 PM  PHQ 2/9 Scores  PHQ - 2 Score 4 6 3  2  6 6 1   PHQ- 9 Score 7 9 12 9 13 14 5     Fall Risk    06/19/2022    1:36 PM 05/20/2022   10:49 AM 01/23/2022   11:34 AM 12/11/2021   10:55 AM 10/23/2021   12:44 PM  Fall Risk   Falls in the past year? 0 0 1 1 1   Number falls in past yr: 0 0 1 0   Injury with Fall? 0 0 0 0   Risk for fall due to : No Fall Risks  No Fall Risks    Follow up Falls prevention discussed;Education provided        FALL RISK PREVENTION PERTAINING TO THE HOME:  Any stairs in or around the home? Yes  If so, are there any without handrails? Yes  Home free of loose throw rugs in walkways, pet beds, electrical cords, etc? Yes  Adequate lighting in your home to reduce risk of falls? Yes   ASSISTIVE DEVICES UTILIZED TO PREVENT FALLS:  Life alert? No  Use of a cane, walker or w/c? No  Grab bars in the bathroom? Yes  Shower chair or bench in shower? Yes  Elevated toilet seat or a handicapped toilet? Yes    Cognitive Function:        06/19/2022    1:53 PM 09/25/2016   10:29 AM  6CIT Screen  What Year? 0 points 0 points  What month? 0 points 0 points  What time? 0 points 0 points  Count back from 20 0 points 0 points  Months in reverse 0 points 0 points  Repeat phrase 2 points 2 points  Total Score 2 points 2 points    Immunizations Immunization History  Administered Date(s) Administered   Pneumococcal Conjugate-13 01/19/2014    TDAP status: Due, Education has been provided regarding the importance of this vaccine. Advised may receive this vaccine at local pharmacy or Health Dept. Aware to provide a copy of the vaccination record if obtained from local pharmacy or Health Dept. Verbalized acceptance and understanding.  Flu Vaccine status: Declined, Education has been provided regarding the importance of this vaccine but patient still declined. Advised may  receive this vaccine at local pharmacy or Health Dept. Aware to provide a copy of the vaccination record if obtained from local pharmacy or Health Dept. Verbalized acceptance and understanding.  Pneumococcal vaccine status: Up to date  Covid-19 vaccine status: Declined, Education has been provided regarding the importance of this vaccine but patient still declined. Advised may receive this vaccine at local pharmacy or Health Dept.or vaccine clinic. Aware to provide a copy of the vaccination record if obtained from local pharmacy or Health Dept. Verbalized acceptance and understanding.  Qualifies for Shingles Vaccine? Yes   Zostavax completed No   Shingrix Completed?: No.    Education has been provided regarding the importance of this vaccine. Patient has been advised to call insurance company to determine out of pocket expense if they have not yet received this vaccine. Advised may also receive vaccine at local pharmacy or Health Dept. Verbalized acceptance and understanding.  Screening Tests Health Maintenance  Topic Date Due   COVID-19 Vaccine (1) Never done   Zoster Vaccines- Shingrix (1 of 2) Never done   MAMMOGRAM  Never done   Pneumonia Vaccine 66+ Years old (2 of 2 - PPSV23 or PCV20) 01/24/2023 (Originally 01/20/2015)   INFLUENZA VACCINE  09/19/2022   Medicare Annual Wellness (AWV)  06/19/2023   COLONOSCOPY (Pts 45-69yrs Insurance coverage will  need to be confirmed)  10/26/2024   DEXA SCAN  Completed   Hepatitis C Screening  Completed   HPV VACCINES  Aged Out   DTaP/Tdap/Td  Discontinued    Health Maintenance   Health Maintenance Due  Topic Date Due   COVID-19 Vaccine (1) Never done   Zoster Vaccines- Shingrix (1 of 2) Never done   MAMMOGRAM  Never done    Colorectal cancer screening: Type of screening: Colonoscopy. Completed yes. Repeat every 5 years  Mammogram status: No longer required due to age.  Bone Density status: Completed yes. Results reflect: Bone density  results: NORMAL. Repeat every 5 years.  Lung Cancer Screening: (Low Dose CT Chest recommended if Age 2-80 years, 30 pack-year currently smoking OR have quit w/in 15years.) does not qualify.   Lung Cancer Screening Referral: no  Additional Screening:  Hepatitis C Screening: does not qualify; Completed yes  Vision Screening: Recommended annual ophthalmology exams for early detection of glaucoma and other disorders of the eye. Is the patient up to date with their annual eye exam?  Yes  Who is the provider or what is the name of the office in which the patient attends annual eye exams? Dr Larence Penning If pt is not established with a provider, would they like to be referred to a provider to establish care? No .   Dental Screening: Recommended annual dental exams for proper oral hygiene  Community Resource Referral / Chronic Care Management: CRR required this visit?  No   CCM required this visit?  No      Plan:     I have personally reviewed and noted the following in the patient's chart:   Medical and social history Use of alcohol, tobacco or illicit drugs  Current medications and supplements including opioid prescriptions. Patient is not currently taking opioid prescriptions. Functional ability and status Nutritional status Physical activity Advanced directives List of other physicians Hospitalizations, surgeries, and ER visits in previous 12 months Vitals Screenings to include cognitive, depression, and falls Referrals and appointments  In addition, I have reviewed and discussed with patient certain preventive protocols, quality metrics, and best practice recommendations. A written personalized care plan for preventive services as well as general preventive health recommendations were provided to patient.     Wanda Lush, LPN   02/27/1476   Nurse Notes: The patient states she is doing well. She relays she has lost her Xanax and Cardizem prescriptions. She is requesting a  refill of Xanax as she says she has been without it since yesterday. She will call the pharmacy for refilling of Cardizem

## 2022-06-19 NOTE — Telephone Encounter (Signed)
Medication Refill - Medication: ALPRAZolam (XANAX) 0.5 MG tablet    ( patient states she is out of medication and needs it)    Has the patient contacted their pharmacy? Yes.     Preferred Pharmacy (with phone number or street name):  [facility] [address] ([address]),  - [address]  [address] [place]) [place] [address]  Phone: [phone] Fax: [phone]  Hours: Not open 24 hours     Has the patient been seen for an appointment in the last year OR does the patient have an upcoming appointment? Yes.    Agent: Please be advised that RX refills may take up to 3 business days. We ask that you follow-up with your pharmacy.

## 2022-06-19 NOTE — Telephone Encounter (Signed)
Requested medication (s) are due for refill today - yes  Requested medication (s) are on the active medication list -yes  Future visit scheduled -yes  Last refill: 02/01/22 #90 2RF  Notes to clinic: non delegated Rx  Requested Prescriptions  Pending Prescriptions Disp Refills   ALPRAZolam (XANAX) 0.5 MG tablet [Pharmacy Med Name: ALPRAZolam 0.5 MG Oral Tablet] 90 tablet 0    Sig: TAKE 1 TABLET BY MOUTH THREE TIMES DAILY AS NEEDED FOR ANXIETY/SLEEP     Not Delegated - Psychiatry: Anxiolytics/Hypnotics 2 Failed - 06/18/2022 11:05 PM      Failed - This refill cannot be delegated      Failed - Urine Drug Screen completed in last 360 days      Passed - Patient is not pregnant      Passed - Valid encounter within last 6 months    Recent Outpatient Visits           1 month ago Splenic lesion   [facility] Malva Limes, MD   4 months ago Anxiety   [facility] Malva Limes, MD   6 months ago Acute cough   [place] [facility] [place], [place], PA-C   7 months ago Acute non-recurrent frontal sinusitis   [facility] Malva Limes, MD   8 months ago Obesity (BMI 30.0-34.9)   [place] [facility] [place], [place], [name]       Future Appointments             In 3 weeks Sherrie Mustache, [name], MD [facility], [facility]               Requested Prescriptions  Pending Prescriptions Disp Refills   ALPRAZolam Prudy Feeler) 0.5 MG tablet [Pharmacy Med Name: ALPRAZolam 0.5 MG Oral Tablet] 90 tablet 0    Sig: TAKE 1 TABLET BY MOUTH THREE TIMES DAILY AS NEEDED FOR ANXIETY/SLEEP     Not Delegated - Psychiatry: Anxiolytics/Hypnotics 2 Failed - 06/18/2022 11:05 PM      Failed - This refill cannot be delegated      Failed - Urine Drug Screen completed in last 360 days      Passed - Patient is not pregnant      Passed - Valid encounter within last 6  months    Recent Outpatient Visits           1 month ago Splenic lesion   [facility] Limes, MD   4 months ago Anxiety   [facility]imes, MD   6 months ago Acute cough   [facility] [place], [place], PA-C   7 months ago Acute non-recurrent frontal sinusitis   [facility] [place] Malva Limes, MD   8 months ago Obesity (BMI 30.0-34.9)   [facility] [place], [place], [name]       Future Appointments             In 3 weeks [name], [name], MD [facility], [facility]

## 2022-06-19 NOTE — Telephone Encounter (Signed)
Requested medication (s) are due for refill today - yes  Requested medication (s) are on the active medication list -yes  Future visit scheduled -yes  Last refill: 02/02/23 #90 2RF  Notes to clinic: non delegated Rx  Requested Prescriptions  Pending Prescriptions Disp Refills   ALPRAZolam (XANAX) 0.5 MG tablet 90 tablet 2     Not Delegated - Psychiatry: Anxiolytics/Hypnotics 2 Failed - 06/19/2022  9:42 AM      Failed - This refill cannot be delegated      Failed - Urine Drug Screen completed in last 360 days      Passed - Patient is not pregnant      Passed - Valid encounter within last 6 months    Recent Outpatient Visits           1 month ago Splenic lesion   [facility] Malva Limes, MD   4 months ago Anxiety   [facility] Limes, MD   6 months ago Acute cough   [place] [facility] [place], [name], PA-C   7 months ago Acute non-recurrent frontal sinusitis   [name] Malva Limes, MD   8 months ago Obesity (BMI 30.0-34.9)   [facility] [place], [name], [name]       Future Appointments             In 3 weeks Sherrie Mustache, [name], MD [name], [name]               Requested Prescriptions  Pending Prescriptions Disp Refills   ALPRAZolam Prudy Feeler) 0.5 MG tablet 90 tablet 2     Not Delegated - Psychiatry: Anxiolytics/Hypnotics 2 Failed - 06/19/2022  9:42 AM      Failed - This refill cannot be delegated      Failed - Urine Drug Screen completed in last 360 days      Passed - Patient is not pregnant      Passed - Valid encounter within last 6 months    Recent Outpatient Visits           1 month ago Splenic lesion   [facility] Malva Limes, MD   4 months ago Anxiety   [facility] Malva Limes, MD   6 months ago Acute cough   [facility] [place], [name], PA-C   7 months ago Acute non-recurrent frontal sinusitis   [facility] Malva Limes, MD   8 months ago Obesity (BMI 30.0-34.9)   [facility] [name], [name], [name]       Future Appointments             In 3 weeks [name], [name], MD [name], [name]

## 2022-06-19 NOTE — Patient Instructions (Signed)
[name] , Thank you for taking time to come for your Medicare Wellness Visit. I appreciate your ongoing commitment to your health goals. Please review the following plan we discussed and let me know if I can assist you in the future.   These are the goals we discussed:  Goals       "I would like a living trust" (pt-stated)      CARE PLAN ENTRY (see longitudinal plan of care for additional care plan information)  Current Barriers:  Lacks knowledge of community resource: regarding Living Trust  Clinical Social Work Clinical Goal(s):  Over the next 90 days, patient will work with a Retail buyer to address needs related to [employer]  Interventions: Inter-disciplinary care team collaboration (see longitudinal plan of care) Patient discussed continued need to get her affairs in order and would like resources to complete her estate planning Positive reinforcement provided for motivation to have end of life plans Patient confirmed that she has been in contact with Elder Law Attorney's and is currently working on arranging her finances to pay for the needed estate planning   Patient Self Care Activities:  Performs ADL's independently Performs IADL's independently Knowledge deficit regarding legal resources to complete [employer]  Initial goal documentation       "I would like to manage my depression better" (pt-stated)      Current Barriers:  Mental Health Concerns   Clinical Social Work Clinical Goal(s):  Over the next 90 days, client will work with SW to address concerns related to depression Interventions: Followed up with patient in regards to her management of stress and depression Patient continues to discuss improvement in mood now, discussing some set backs due to the holidays Continued to discuss leaning on positive network of support from her family Discussed relationship concerns, boundary setting and positive coping Continued to provided patient with  positive reinforcement and emotional support  Discussed plans with patient for ongoing care management follow up and provided patient with direct contact information for care management team  Patient Self Care Activities:  Performs ADL's independently Performs IADL's independently Calls provider office for new concerns or questions  Please see past updates related to this goal by clicking on the "Past Updates" button in the selected goal        DIET - EAT MORE FRUITS AND VEGETABLES      DIET - REDUCE PORTION SIZE      Recommend decreasing portion sizes in half and eating 3 small meals a day with 2 healthy snacks in between.       Eat more fruits and vegetables      Recommend increasing consumption of fruits and vegetables daily (2 servings).      Manage My Emotions      Timeframe:  Long-Range Goal Priority:  High Start Date:  02/16/20                           Expected End Date: 08/09/21                    Follow Up Date  - talk about feelings with a friend, family or spiritual advisor - practice positive thinking and self-talk  -continue to prioritize self care and the establishing of boundaries -follow up with medical provider when needed -continue to consider ongoing personal counseling    Why is this important?   When you are stressed, down or upset, your body  reacts too.  For example, your blood pressure may get higher; you may have a headache or stomachache.  When your emotions get the best of you, your body's ability to fight off cold and flu gets weak.  These steps will help you manage your emotions.     Notes:       Management of Depression      Care Coordination Interventions: Patient's provider referred patient for Mental Health counseling and resources Patient continues to endorse challenges with anxiety, however today reports being less symptomatic Patient agreeable to receiving resources for mental health counseling in the event that she needs them in the  future-contact information provided for [employer] [phone] Self Care Plan discussed and continues to be reinforced-patient has close friends that she continues to talk to for support, patient describes a strong spiritual connection, and practice of focusing on things that she can control Patient discussed starting to establish better boundaries to manage stress as well as considering ongoing mental health follow up           This is a list of the screening recommended for you and due dates:  Health Maintenance  Topic Date Due   COVID-19 Vaccine (1) Never done   Zoster (Shingles) Vaccine (1 of 2) Never done   Mammogram  Never done   Pneumonia Vaccine (2 of 2 - PPSV23 or PCV20) 01/24/2023*   Flu Shot  09/19/2022   Medicare Annual Wellness Visit  06/19/2023   Colon Cancer Screening  10/26/2024   DEXA scan (bone density measurement)  Completed   Hepatitis C Screening: USPSTF Recommendation to screen - Ages 73-79 yo.  Completed   HPV Vaccine  Aged Out   DTaP/Tdap/Td vaccine  Discontinued  *Topic was postponed. The date shown is not the original due date.    Advanced directives: yes  Conditions/risks identified: low falls risk  Next appointment: Follow up in one year for your annual wellness visit 06/24/2023 @1 :30pm telephone   Preventive Care 65 Years and Older, Female Preventive care refers to lifestyle choices and visits with your health care provider that can promote health and wellness. What does preventive care include? A yearly physical exam. This is also called an annual well check. Dental exams once or twice a year. Routine eye exams. Ask your health care provider how often you should have your eyes checked. Personal lifestyle choices, including: Daily care of your teeth and gums. Regular physical activity. Eating a healthy diet. Avoiding tobacco and drug use. Limiting alcohol use. Practicing safe sex. Taking low-dose aspirin every day. Taking vitamin and  mineral supplements as recommended by your health care provider. What happens during an annual well check? The services and screenings done by your health care provider during your annual well check will depend on your age, overall health, lifestyle risk factors, and family history of disease. Counseling  Your health care provider may ask you questions about your: Alcohol use. Tobacco use. Drug use. Emotional well-being. Home and relationship well-being. Sexual activity. Eating habits. History of falls. Memory and ability to understand (cognition). Work and work Astronomer. Reproductive health. Screening  You may have the following tests or measurements: Height, weight, and BMI. Blood pressure. Lipid and cholesterol levels. These may be checked every 5 years, or more frequently if you are over 70 years old. Skin check. Lung cancer screening. You may have this screening every year starting at age 33 if you have a 30-pack-year history of smoking and currently smoke or have quit within the  past 15 years. Fecal occult blood test (FOBT) of the stool. You may have this test every year starting at age 69. Flexible sigmoidoscopy or colonoscopy. You may have a sigmoidoscopy every 5 years or a colonoscopy every 10 years starting at age 49. Hepatitis C blood test. Hepatitis B blood test. Sexually transmitted disease (STD) testing. Diabetes screening. This is done by checking your blood sugar (glucose) after you have not eaten for a while (fasting). You may have this done every 1-3 years. Bone density scan. This is done to screen for osteoporosis. You may have this done starting at age 31. Mammogram. This may be done every 1-2 years. Talk to your health care provider about how often you should have regular mammograms. Talk with your health care provider about your test results, treatment options, and if necessary, the need for more tests. Vaccines  Your health care provider may recommend  certain vaccines, such as: Influenza vaccine. This is recommended every year. Tetanus, diphtheria, and acellular pertussis (Tdap, Td) vaccine. You may need a Td booster every 10 years. Zoster vaccine. You may need this after age 61. Pneumococcal 13-valent conjugate (PCV13) vaccine. One dose is recommended after age 61. Pneumococcal polysaccharide (PPSV23) vaccine. One dose is recommended after age 20. Talk to your health care provider about which screenings and vaccines you need and how often you need them. This information is not intended to replace advice given to you by your health care provider. Make sure you discuss any questions you have with your health care provider. Document Released: 03/03/2015 Document Revised: 10/25/2015 Document Reviewed: 12/06/2014 Elsevier Interactive Patient Education  2017 [employer].  Fall Prevention in the Home Falls can cause injuries. They can happen to people of all ages. There are many things you can do to make your home safe and to help prevent falls. What can I do on the outside of my home? Regularly fix the edges of walkways and driveways and fix any cracks. Remove anything that might make you trip as you walk through a door, such as a raised step or threshold. Trim any bushes or trees on the path to your home. Use bright outdoor lighting. Clear any walking paths of anything that might make someone trip, such as rocks or tools. Regularly check to see if handrails are loose or broken. Make sure that both sides of any steps have handrails. Any raised decks and porches should have guardrails on the edges. Have any leaves, snow, or ice cleared regularly. Use sand or salt on walking paths during winter. Clean up any spills in your garage right away. This includes oil or grease spills. What can I do in the bathroom? Use night lights. Install grab bars by the toilet and in the tub and shower. Do not use towel bars as grab bars. Use non-skid mats or  decals in the tub or shower. If you need to sit down in the shower, use a plastic, non-slip stool. Keep the floor dry. Clean up any water that spills on the floor as soon as it happens. Remove soap buildup in the tub or shower regularly. Attach bath mats securely with double-sided non-slip rug tape. Do not have throw rugs and other things on the floor that can make you trip. What can I do in the bedroom? Use night lights. Make sure that you have a light by your bed that is easy to reach. Do not use any sheets or blankets that are too big for your bed. They should not  hang down onto the floor. Have a firm chair that has side arms. You can use this for support while you get dressed. Do not have throw rugs and other things on the floor that can make you trip. What can I do in the kitchen? Clean up any spills right away. Avoid walking on wet floors. Keep items that you use a lot in easy-to-reach places. If you need to reach something above you, use a strong step stool that has a grab bar. Keep electrical cords out of the way. Do not use floor polish or wax that makes floors slippery. If you must use wax, use non-skid floor wax. Do not have throw rugs and other things on the floor that can make you trip. What can I do with my stairs? Do not leave any items on the stairs. Make sure that there are handrails on both sides of the stairs and use them. Fix handrails that are broken or loose. Make sure that handrails are as long as the stairways. Check any carpeting to make sure that it is firmly attached to the stairs. Fix any carpet that is loose or worn. Avoid having throw rugs at the top or bottom of the stairs. If you do have throw rugs, attach them to the floor with carpet tape. Make sure that you have a light switch at the top of the stairs and the bottom of the stairs. If you do not have them, ask someone to add them for you. What else can I do to help prevent falls? Wear shoes that: Do not  have high heels. Have rubber bottoms. Are comfortable and fit you well. Are closed at the toe. Do not wear sandals. If you use a stepladder: Make sure that it is fully opened. Do not climb a closed stepladder. Make sure that both sides of the stepladder are locked into place. Ask someone to hold it for you, if possible. Clearly mark and make sure that you can see: Any grab bars or handrails. First and last steps. Where the edge of each step is. Use tools that help you move around (mobility aids) if they are needed. These include: Canes. Walkers. Scooters. Crutches. Turn on the lights when you go into a dark area. Replace any light bulbs as soon as they burn out. Set up your furniture so you have a clear path. Avoid moving your furniture around. If any of your floors are uneven, fix them. If there are any pets around you, be aware of where they are. Review your medicines with your doctor. Some medicines can make you feel dizzy. This can increase your chance of falling. Ask your doctor what other things that you can do to help prevent falls. This information is not intended to replace advice given to you by your health care provider. Make sure you discuss any questions you have with your health care provider. Document Released: 12/01/2008 Document Revised: 07/13/2015 Document Reviewed: 03/11/2014 Elsevier Interactive Patient Education  2017 [employer].

## 2022-07-07 ENCOUNTER — Other Ambulatory Visit: Payer: Self-pay | Admitting: Family Medicine

## 2022-07-07 DIAGNOSIS — I471 Supraventricular tachycardia, unspecified: Secondary | ICD-10-CM

## 2022-07-09 NOTE — Telephone Encounter (Signed)
Requested medication (s) are due for refill today: Due 07/12/22  Requested medication (s) are on the active medication list: yes    Last refill: 06/12/22  #30  0 refills  Future visit scheduled Yes  Notes to clinic:Pt has appt tomorrow 07/10/22. Please review. Thank you  Requested Prescriptions  Pending Prescriptions Disp Refills   diltiazem (CARDIZEM CD) 120 MG 24 hr capsule [Pharmacy Med Name: dilTIAZem HCl ER Coated Beads 120 MG Oral Capsule Extended Release 24 Hour] 30 capsule 0    Sig: Take 1 capsule by mouth once daily     Cardiovascular: Calcium Channel Blockers 3 Failed - 07/07/2022 12:30 PM      Failed - Last BP in normal range    BP Readings from Last 1 Encounters:  05/20/22 (!) 145/85         Passed - ALT in normal range and within 360 days    ALT  Date Value Ref Range Status  10/15/2021 17 0 - 32 IU/L Final   SGPT (ALT)  Date Value Ref Range Status  06/05/2014 12 (L) U/L Final    Comment:    14-54 NOTE: New Reference Range  04/26/14          Passed - AST in normal range and within 360 days    AST  Date Value Ref Range Status  10/15/2021 20 0 - 40 IU/L Final   SGOT(AST)  Date Value Ref Range Status  06/05/2014 21 U/L Final    Comment:    15-41 NOTE: New Reference Range  04/26/14          Passed - Cr in normal range and within 360 days    Creatinine  Date Value Ref Range Status  06/05/2014 0.83 mg/dL Final    Comment:    2.95-6.21 NOTE: New Reference Range  04/26/14    Creatinine, Ser  Date Value Ref Range Status  10/15/2021 0.96 0.57 - 1.00 mg/dL Final         Passed - Last Heart Rate in normal range    Pulse Readings from Last 1 Encounters:  05/20/22 70         Passed - Valid encounter within last 6 months    Recent Outpatient Visits           1 month ago Splenic lesion   [facility] Malva Limes, MD   5 months ago Anxiety   [facility] Malva Limes, MD   7 months ago  Acute cough   [place] [facility] [place], [place], PA-C   7 months ago Acute non-recurrent frontal sinusitis   [facility] Malva Limes, MD   8 months ago Obesity (BMI 30.0-34.9)   [facility] [place], [place], [name]       Future Appointments             Tomorrow [name], [name], MD [facility]

## 2022-07-10 ENCOUNTER — Encounter: Payer: Self-pay | Admitting: Family Medicine

## 2022-07-10 ENCOUNTER — Ambulatory Visit (INDEPENDENT_AMBULATORY_CARE_PROVIDER_SITE_OTHER): Payer: Medicare Other | Admitting: Family Medicine

## 2022-07-10 VITALS — BP 131/69 | HR 79 | Temp 97.7°F | Resp 18 | Wt 181.0 lb

## 2022-07-10 DIAGNOSIS — I471 Supraventricular tachycardia, unspecified: Secondary | ICD-10-CM | POA: Diagnosis not present

## 2022-07-10 DIAGNOSIS — I10 Essential (primary) hypertension: Secondary | ICD-10-CM

## 2022-07-10 DIAGNOSIS — F329 Major depressive disorder, single episode, unspecified: Secondary | ICD-10-CM

## 2022-07-10 MED ORDER — ESCITALOPRAM OXALATE 10 MG PO TABS: ORAL_TABLET | ORAL | Status: DC

## 2022-07-10 NOTE — Progress Notes (Signed)
Established patient visit   I,Roshena L Chambers,acting as a scribe for Mila Merry, MD.,have documented all relevant documentation on the behalf of Mila Merry, MD,as directed by  Mila Merry, MD  Patient: [name]   DOB: [birth year]   77 y.o. Female  MRN: [id number] Visit Date: 07/10/2022  Today's healthcare provider: Mila Merry, MD   Chief Complaint  Patient presents with   Irregular Heart Beat    Follow up of SVT.   Subjective    HPI  Follow up for SVT:  The patient was started on diltiazem 120 on 06/12/2022 due to [facility] monitor results showing episodes of SVT.   She reports good compliance with treatment. She feels that condition is Improved. Still has episodes of extreme fatigue and brief dizzy spells, but are improved.  She is not having side effects.   She also states she is depressed which may be contributing to days of extreme fatigue. She had been taking citalopram for years , but stopped last fall. Was prescribed escitalopram in December which she picked up from pharmacy but never started because she is concerned about it being habit forming.     06/19/2022    1:40 PM 05/20/2022   10:49 AM 01/23/2022   11:34 AM  Depression screen PHQ 2/9  Decreased Interest 1 3 0  Down, Depressed, Hopeless 3 3 3   PHQ - 2 Score 4 6 3   Altered sleeping 0 0 0  Tired, decreased energy 3 3 3   Change in appetite 0 0 3  Feeling bad or failure about yourself  0 0 3  Trouble concentrating 0 0 0  Moving slowly or fidgety/restless 0 0 0  Suicidal thoughts 0 0 0  PHQ-9 Score 7 9 12   Difficult doing work/chores Somewhat difficult Not difficult at all Somewhat difficult    -----------------------------------------------------------------------------------------   Medications: Outpatient Medications Prior to Visit  Medication Sig   albuterol (VENTOLIN HFA) 108 (90 Base) MCG/ACT inhaler TAKE 2 PUFFS BY MOUTH EVERY 6 HOURS AS NEEDED FOR WHEEZE OR SHORTNESS OF BREATH    ALPRAZolam (XANAX) 0.5 MG tablet TAKE 1 TABLET BY MOUTH THREE TIMES DAILY AS NEEDED FOR ANXIETY/SLEEP   aspirin EC 81 MG tablet Take 81 mg by mouth daily.   clotrimazole (LOTRIMIN) 1 % cream Apply 1 application topically 2 (two) times daily.   clotrimazole (MYCELEX) 10 MG troche Take 1 tablet (10 mg total) by mouth 5 (five) times daily.   diltiazem (CARDIZEM CD) 120 MG 24 hr capsule Take 1 capsule by mouth once daily   fluticasone (FLONASE) 50 MCG/ACT nasal spray PLACE 1-2 SPRAYS INTO BOTH NOSTRILS DAILY AS NEEDED FOR ALLERGIES OR RHINITIS.   ibuprofen (ADVIL) 800 MG tablet Take 1 tablet (800 mg total) by mouth daily as needed (severe pain).   lactulose (CHRONULAC) 10 GM/15ML solution Take 45 mLs (30 g total) by mouth daily as needed for mild constipation.   omeprazole (PRILOSEC) 20 MG capsule Take 20 mg by mouth daily as needed (acid reflux).    omeprazole (PRILOSEC) 40 MG capsule Take 1 capsule (40 mg total) by mouth daily.   promethazine (PHENERGAN) 12.5 MG tablet Take 1-2 tablets (12.5-25 mg total) by mouth every 8 (eight) hours as needed for nausea or vomiting.   rosuvastatin (CRESTOR) 40 MG tablet Take 1 tablet (40 mg total) by mouth daily.   Spacer/Aero-Holding Chambers (EASIVENT) inhaler Use as needed with inhaler   Spacer/Aero-Holding Chambers (EQ SPACE CHAMBER ANTI-STATIC) DEVI Inhale into the  lungs as needed.   [DISCONTINUED] albuterol (VENTOLIN HFA) 108 (90 Base) MCG/ACT inhaler Inhale into the lungs.   No facility-administered medications prior to visit.    Review of Systems  Constitutional:  Negative for appetite change, chills, fatigue and fever.  Respiratory:  Negative for chest tightness and shortness of breath.   Cardiovascular:  Negative for chest pain and palpitations.  Gastrointestinal:  Negative for abdominal pain, nausea and vomiting.  Neurological:  Positive for dizziness. Negative for weakness.       Objective    BP 131/69   Pulse 79   Temp 97.7 F (36.5 C)  (Oral)   Resp 18   Wt 181 lb (82.1 kg)   SpO2 98% Comment: room air  BMI 32.06 kg/m  BP Readings from Last 3 Encounters:  07/10/22 131/69  05/20/22 (!) 145/85  01/23/22 138/78    Physical Exam   General: Appearance:    Mildly obese female in no acute distress  Eyes:    PERRL, conjunctiva/corneas clear, EOM's intact       Lungs:     Clear to auscultation bilaterally, respirations unlabored  Heart:    Normal heart rate. Normal rhythm. No murmurs, rubs, or gallops.    MS:   All extremities are intact.    Neurologic:   Awake, alert, oriented x 3. No apparent focal neurological defect.        Assessment & Plan     1. SVT (supraventricular tachycardia)/Hypertension Doing well with initiation of diltiazem  2. Major depressive disorder with single episode, remission status unspecified She has escitalopram at home and recommended she start taking it. Call if any problems with medication. Otherwise follow up 6 months.      The entirety of the information documented in the History of Present Illness, Review of Systems and Physical Exam were personally obtained by me. Portions of this information were initially documented by the CMA and reviewed by me for thoroughness and accuracy.     Mila Merry, MD  [facility] [phone] (phone) [phone] (fax)  [facility]

## 2022-07-10 NOTE — Patient Instructions (Signed)
.   Please review the attached list of medications and notify my office if there are any errors.   . Please bring all of your medications to every appointment so we can make sure that our medication list is the same as yours.   

## 2022-07-16 ENCOUNTER — Ambulatory Visit: Payer: Self-pay | Admitting: *Deleted

## 2022-07-16 NOTE — Telephone Encounter (Signed)
Message from Valora Piccolo sent at 07/16/2022 10:02 AM EDT  Summary: Medication dosage Advice   Pt is calling to ask what is the dosage of her medication? diltiazem (CARDIZEM CD) 120 MG 24 hr capsule [[id number]]? Pt reports that the pill has changed in color. Please advise          Call History   Type Contact Phone/Fax User  07/16/2022 10:01 AM EDT Phone (Incoming) Alison, [place] (Self) [phone] Rexene Edison) Valora Piccolo   Reason for Disposition  Caller has medicine question only, adult not sick, AND triager answers question  Protocols used: Medication Question Call-A-AH

## 2022-07-16 NOTE — Telephone Encounter (Signed)
  Chief Complaint: Wanted to make sure the medication was right because the pill was a different color this time.   Cardizem 120 mg 24 hour tablet. Symptoms: N/A Frequency: N/A Pertinent Negatives: Patient denies N/A Disposition: [] ED /[] Urgent Care (no appt availability in office) / [] Appointment(In office/virtual)/ []  [place] [facility]/ [x] Home Care/ [] Refused Recommended Disposition /[] [place] [facility]/ []  Follow-up with PCP Additional Notes:

## 2022-08-09 ENCOUNTER — Other Ambulatory Visit: Payer: Self-pay | Admitting: Family Medicine

## 2022-08-09 DIAGNOSIS — I471 Supraventricular tachycardia, unspecified: Secondary | ICD-10-CM

## 2022-09-10 ENCOUNTER — Other Ambulatory Visit: Payer: Self-pay | Admitting: Family Medicine

## 2022-09-10 DIAGNOSIS — I471 Supraventricular tachycardia, unspecified: Secondary | ICD-10-CM

## 2022-09-18 ENCOUNTER — Other Ambulatory Visit: Payer: Self-pay | Admitting: Family Medicine

## 2022-09-19 NOTE — Telephone Encounter (Signed)
Requested medication (s) are due for refill today:yes  Requested medication (s) are on the active medication list: yes  Last refill:  06/18/22 #20  Future visit scheduled: yes  Notes to clinic:  med not delegated to NT to RF   Requested Prescriptions  Pending Prescriptions Disp Refills   promethazine (PHENERGAN) 12.5 MG tablet [Pharmacy Med Name: Promethazine HCl 12.5 MG Oral Tablet] 20 tablet 0    Sig: TAKE 1 TO 2 TABLETS BY MOUTH EVERY 8 HOURS AS NEEDED FOR NAUSEA FOR VOMITING     Not Delegated - Gastroenterology: Antiemetics Failed - 09/18/2022  9:11 PM      Failed - This refill cannot be delegated      Passed - Valid encounter within last 6 months    Recent Outpatient Visits           2 months ago SVT (supraventricular tachycardia)   [place] Malva Limes, MD   4 months ago Splenic lesion   [facility] Malva Limes, MD   7 months ago Anxiety   [facility] Malva Limes, MD   9 months ago Acute cough   [facility] [place], [place], PA-C   10 months ago Acute non-recurrent frontal sinusitis   [facility] Malva Limes, MD       Future Appointments             In 3 weeks [name], [name], MD [facility]

## 2022-09-20 ENCOUNTER — Ambulatory Visit: Payer: Self-pay

## 2022-09-20 ENCOUNTER — Ambulatory Visit (INDEPENDENT_AMBULATORY_CARE_PROVIDER_SITE_OTHER): Payer: Medicare Other | Admitting: Family Medicine

## 2022-09-20 ENCOUNTER — Encounter: Payer: Self-pay | Admitting: Family Medicine

## 2022-09-20 VITALS — BP 141/76 | HR 88 | Ht 63.0 in | Wt 183.3 lb

## 2022-09-20 DIAGNOSIS — U071 COVID-19: Secondary | ICD-10-CM | POA: Insufficient documentation

## 2022-09-20 DIAGNOSIS — R059 Cough, unspecified: Secondary | ICD-10-CM | POA: Insufficient documentation

## 2022-09-20 DIAGNOSIS — Z20822 Contact with and (suspected) exposure to covid-19: Secondary | ICD-10-CM | POA: Insufficient documentation

## 2022-09-20 HISTORY — DX: COVID-19: U07.1

## 2022-09-20 LAB — POC COVID19 BINAXNOW: SARS Coronavirus 2 Ag: POSITIVE — AB

## 2022-09-20 MED ORDER — BENZONATATE 200 MG PO CAPS
200.0000 mg | ORAL_CAPSULE | Freq: Three times a day (TID) | ORAL | 0 refills | Status: DC | PRN
Start: 2022-09-20 — End: 2022-12-20

## 2022-09-20 MED ORDER — NIRMATRELVIR/RITONAVIR (PAXLOVID)TABLET
3.0000 | ORAL_TABLET | Freq: Two times a day (BID) | ORAL | 0 refills | Status: AC
Start: 2022-09-20 — End: 2022-09-25

## 2022-09-20 NOTE — Telephone Encounter (Signed)
Message from [name] sent at 09/20/2022  8:18 AM EDT  Summary: Congestion,headache   Patient states that she has had congestion and a headache for 2 days. Patient is requesting an antibiotic. Please advise.         Chief Complaint: cough Symptoms: headache/coughing brown to yellow phlegm,sinus congestion Frequency: 2 days  Pertinent Negatives: Patient denies SOB fever Disposition: [] ED /[] Urgent Care (no appt availability in office) / [x] Appointment(In office/virtual)/ []  [facility]/ [] Home Care/ [] Refused Recommended Disposition /[] [facility]/ []  Follow-up with PCP Additional Notes: - Reason for Disposition  [1] HIGH RISK patient (e.g., weak immune system, age > 64 years, obesity with BMI 30 or higher, pregnant, chronic lung disease or other chronic medical condition) AND [2] COVID symptoms (e.g., cough, fever)  (Exceptions: Already seen by PCP and no new or worsening symptoms.)  Answer Assessment - Initial Assessment Questions 1. COVID-19 DIAGNOSIS: "How do you know that you have COVID?" (e.g., positive lab test or self-test, diagnosed by doctor or NP/PA, symptoms after exposure).     Not tested 2. COVID-19 EXPOSURE: "Was there any known exposure to COVID before the symptoms began?" CDC Definition of close contact: within 6 feet (2 meters) for a total of 15 minutes or more over a 24-hour period.      Yes  3. ONSET: "When did the COVID-19 symptoms start?"      2 days ago  4. WORST SYMPTOM: "What is your worst symptom?" (e.g., cough, fever, shortness of breath, muscle aches)     Congestion weakness 5. COUGH: "Do you have a cough?" If Yes, ask: "How bad is the cough?"       Yes- yellow /occasional cough  6. FEVER: "Do you have a fever?" If Yes, ask: "What is your temperature, how was it measured, and when did it start?"     no 7. RESPIRATORY STATUS: "Describe your breathing?" (e.g., normal; shortness of breath, wheezing, unable to speak)      SOB with exertion   9. OTHER SYMPTOMS: "Do you have any other symptoms?"  (e.g., chills, fatigue, headache, loss of smell or taste, muscle pain, sore throat)     Fatigue, headache, sinus congestion, nasal drip  10. HIGH RISK DISEASE: "Do you have any chronic medical problems?" (e.g., asthma, heart or lung disease, weak immune system, obesity, etc.)       elderly  Protocols used: Coronavirus (COVID-19) Diagnosed or Suspected-A-AH

## 2022-09-20 NOTE — Assessment & Plan Note (Signed)
Recommend tessalon to assist

## 2022-09-20 NOTE — Progress Notes (Unsigned)
Established patient visit   Patient: [name]   DOB: 1945-05-05   77 y.o. Female  MRN: [id number] Visit Date: 09/20/2022  Today's healthcare provider: Jacky Kindle, FNP  Introduced to nurse practitioner role and practice setting.  All questions answered.  Discussed provider/patient relationship and expectations.  Subjective    Cough Known COVID exposure; request for testing with  HPI     Covid Exposure    Additional comments: POC test started at 10:17 am        Comments   Patient is present due to a cough and having recent covid exposure. Patient reports cough has been present for a few days and has dark green phlegm. Patient was exposed due to husband testing positive on Sunday. Patient has taken coricidin.       Last edited by Acey Lav, CMA on 09/20/2022 10:18 AM.      Medications: Outpatient Medications Prior to Visit  Medication Sig  . albuterol (VENTOLIN HFA) 108 (90 Base) MCG/ACT inhaler TAKE 2 PUFFS BY MOUTH EVERY 6 HOURS AS NEEDED FOR WHEEZE OR SHORTNESS OF BREATH  . ALPRAZolam (XANAX) 0.5 MG tablet TAKE 1 TABLET BY MOUTH THREE TIMES DAILY AS NEEDED FOR ANXIETY/SLEEP  . aspirin EC 81 MG tablet Take 81 mg by mouth daily.  . clotrimazole (LOTRIMIN) 1 % cream Apply 1 application topically 2 (two) times daily.  . clotrimazole (MYCELEX) 10 MG troche Take 1 tablet (10 mg total) by mouth 5 (five) times daily.  [name] diltiazem (CARDIZEM CD) 120 MG 24 hr capsule Take 1 capsule by mouth once daily  . escitalopram (LEXAPRO) 10 MG tablet Take 1/2 tablet every day for 6 days, then increase to 1 tablet every  . fluticasone (FLONASE) 50 MCG/ACT nasal spray PLACE 1-2 SPRAYS INTO BOTH NOSTRILS DAILY AS NEEDED FOR ALLERGIES OR RHINITIS.  [name] ibuprofen (ADVIL) 800 MG tablet Take 1 tablet (800 mg total) by mouth daily as needed (severe pain).  [name] lactulose (CHRONULAC) 10 GM/15ML solution Take 45 mLs (30 g total) by mouth daily as needed for mild constipation.  [name] omeprazole  (PRILOSEC) 20 MG capsule Take 20 mg by mouth daily as needed (acid reflux).   [name] omeprazole (PRILOSEC) 40 MG capsule Take 1 capsule (40 mg total) by mouth daily.  . promethazine (PHENERGAN) 12.5 MG tablet TAKE 1 TO 2 TABLETS BY MOUTH EVERY 8 HOURS AS NEEDED FOR NAUSEA FOR VOMITING  . rosuvastatin (CRESTOR) 40 MG tablet Take 1 tablet (40 mg total) by mouth daily.  [name] Spacer/Aero-Holding Chambers (EASIVENT) inhaler Use as needed with inhaler  . Spacer/Aero-Holding Chambers (EQ SPACE CHAMBER ANTI-STATIC) DEVI Inhale into the lungs as needed.   No facility-administered medications prior to visit.    Review of Systems  Respiratory:  Positive for cough.      Objective    BP (!) 141/76 (BP Location: Right Arm, Patient Position: Sitting, Cuff Size: Normal)   Pulse 88   Ht 5\' 3"  (1.6 m)   Wt 183 lb 4.8 oz (83.1 kg)   SpO2 98%   BMI 32.47 kg/m   Physical Exam Vitals and nursing note reviewed.  Constitutional:      General: She is not in acute distress.    Appearance: Normal appearance. She is obese. She is ill-appearing. She is not toxic-appearing or diaphoretic.  HENT:     Head: Normocephalic and atraumatic.  Cardiovascular:     Rate and Rhythm: Normal rate and regular rhythm.     Pulses: Normal  pulses.     Heart sounds: Normal heart sounds. No murmur heard.    No friction rub. No gallop.  Pulmonary:     Effort: Pulmonary effort is normal. No respiratory distress.     Breath sounds: Normal breath sounds. No stridor. No wheezing, rhonchi or rales.  Chest:     Chest wall: No tenderness.  Musculoskeletal:        General: No swelling, tenderness, deformity or signs of injury. Normal range of motion.     Right lower leg: No edema.     Left lower leg: No edema.  Skin:    General: Skin is warm and dry.     Capillary Refill: Capillary refill takes less than 2 seconds.     Coloration: Skin is not jaundiced or pale.     Findings: No bruising, erythema, lesion or rash.  Neurological:      General: No focal deficit present.     Mental Status: She is alert and oriented to person, place, and time. Mental status is at baseline.     Cranial Nerves: No cranial nerve deficit.     Sensory: No sensory deficit.     Motor: No weakness.     Coordination: Coordination normal.  Psychiatric:        Mood and Affect: Mood normal.        Behavior: Behavior normal.        Thought Content: Thought content normal.        Judgment: Judgment normal.    No results found for any visits on 09/20/22.  Assessment & Plan     Problem List Items Addressed This Visit       Other   Cough    Recommend tessalon to assist       Relevant Medications   benzonatate (TESSALON) 200 MG capsule   COVID-19    POC Covid positive; recommend treatment given age and comorbid conditions Continue supportive care as well as antiviral       Relevant Medications   nirmatrelvir/ritonavir (PAXLOVID) 20 x 150 MG & 10 x 100MG  TABS   Exposure to COVID-19 virus - Primary    Husband ill over the weekend; pt with acute symptoms <3 days Recommend COVID test      Relevant Orders   POC COVID-19    No follow-ups on file.      Leilani Merl, FNP, have reviewed all documentation for this visit. The documentation on 09/20/22 for the exam, diagnosis, procedures, and orders are all accurate and complete.  Jacky Kindle, FNP  [facility] [phone] (phone) [phone] (fax)  [facility]

## 2022-09-20 NOTE — Assessment & Plan Note (Signed)
Husband ill over the weekend; pt with acute symptoms <3 days Recommend COVID test

## 2022-09-20 NOTE — Assessment & Plan Note (Signed)
POC Covid positive; recommend treatment given age and comorbid conditions Continue supportive care as well as antiviral

## 2022-09-21 ENCOUNTER — Encounter: Payer: Self-pay | Admitting: Family Medicine

## 2022-10-10 DIAGNOSIS — R1084 Generalized abdominal pain: Secondary | ICD-10-CM | POA: Diagnosis not present

## 2022-10-10 DIAGNOSIS — R109 Unspecified abdominal pain: Secondary | ICD-10-CM | POA: Diagnosis not present

## 2022-10-10 DIAGNOSIS — R140 Abdominal distension (gaseous): Secondary | ICD-10-CM | POA: Diagnosis not present

## 2022-10-10 DIAGNOSIS — E785 Hyperlipidemia, unspecified: Secondary | ICD-10-CM | POA: Diagnosis not present

## 2022-10-10 DIAGNOSIS — K5900 Constipation, unspecified: Secondary | ICD-10-CM | POA: Diagnosis not present

## 2022-10-10 DIAGNOSIS — Z880 Allergy status to penicillin: Secondary | ICD-10-CM | POA: Diagnosis not present

## 2022-10-10 DIAGNOSIS — K219 Gastro-esophageal reflux disease without esophagitis: Secondary | ICD-10-CM | POA: Diagnosis not present

## 2022-10-10 DIAGNOSIS — D7389 Other diseases of spleen: Secondary | ICD-10-CM | POA: Diagnosis not present

## 2022-10-10 DIAGNOSIS — I10 Essential (primary) hypertension: Secondary | ICD-10-CM | POA: Diagnosis not present

## 2022-10-10 DIAGNOSIS — Z79899 Other long term (current) drug therapy: Secondary | ICD-10-CM | POA: Diagnosis not present

## 2022-10-11 ENCOUNTER — Ambulatory Visit (INDEPENDENT_AMBULATORY_CARE_PROVIDER_SITE_OTHER): Payer: Medicare Other | Admitting: Family Medicine

## 2022-10-11 ENCOUNTER — Ambulatory Visit: Payer: Medicare Other | Admitting: Family Medicine

## 2022-10-11 VITALS — BP 137/78 | HR 78 | Temp 97.2°F | Ht 63.0 in | Wt 180.8 lb

## 2022-10-11 DIAGNOSIS — R109 Unspecified abdominal pain: Secondary | ICD-10-CM | POA: Diagnosis not present

## 2022-10-11 DIAGNOSIS — I10 Essential (primary) hypertension: Secondary | ICD-10-CM

## 2022-10-11 DIAGNOSIS — K5730 Diverticulosis of large intestine without perforation or abscess without bleeding: Secondary | ICD-10-CM

## 2022-10-11 MED ORDER — VERAPAMIL HCL ER 120 MG PO TBCR: 120.0000 mg | EXTENDED_RELEASE_TABLET | Freq: Every day | ORAL | 1 refills | Status: DC

## 2022-10-11 NOTE — Patient Instructions (Signed)
.   Please review the attached list of medications and notify my office if there are any errors.   . Please bring all of your medications to every appointment so we can make sure that our medication list is the same as yours.   

## 2022-10-15 ENCOUNTER — Ambulatory Visit: Payer: Self-pay

## 2022-10-15 ENCOUNTER — Ambulatory Visit (INDEPENDENT_AMBULATORY_CARE_PROVIDER_SITE_OTHER): Payer: Medicare Other | Admitting: Family Medicine

## 2022-10-15 VITALS — BP 132/80 | HR 77 | Ht 63.0 in | Wt 180.2 lb

## 2022-10-15 DIAGNOSIS — J029 Acute pharyngitis, unspecified: Secondary | ICD-10-CM | POA: Diagnosis not present

## 2022-10-15 DIAGNOSIS — R0981 Nasal congestion: Secondary | ICD-10-CM | POA: Diagnosis not present

## 2022-10-15 LAB — POCT RAPID STREP A (OFFICE): Rapid Strep A Screen: NEGATIVE

## 2022-10-15 LAB — POC COVID19 BINAXNOW: SARS Coronavirus 2 Ag: NEGATIVE

## 2022-10-15 NOTE — Telephone Encounter (Signed)
Second attempt to reach pt, mail box full, unable to leave VM to call back.

## 2022-10-15 NOTE — Progress Notes (Signed)
Acute Office Visit  Subjective:     Patient ID: [name], female    DOB: 1945/05/04, 77 y.o.   MRN: [id number]  Chief Complaint  Patient presents with   Sore Throat   Nasal Congestion    Wants a covid test     Sore Throat    Discussed the use of AI scribe software for clinical note transcription with the patient, who gave verbal consent to proceed.  History of Present Illness   The patient presents with a recent onset of nasal congestion and a mildly scratchy throat. They also report a sensation of feeling feverish, although they have not had a measured fever. They have experienced postnasal drip. The symptoms have caused concern as they have a class reunion coming up and do not want to risk infecting others. They deny any ear problems.       ROS per HPI      Objective:    BP 132/80 (BP Location: Right Arm, Patient Position: Sitting, Cuff Size: Normal)   Pulse 77   Ht 5\' 3"  (1.6 m)   Wt 180 lb 3.2 oz (81.7 kg)   SpO2 97%   BMI 31.92 kg/m    Physical Exam Vitals reviewed.  Constitutional:      General: She is not in acute distress.    Appearance: Normal appearance. She is well-developed. She is not diaphoretic.  HENT:     Head: Normocephalic and atraumatic.     Right Ear: Tympanic membrane, ear canal and external ear normal.     Left Ear: Tympanic membrane, ear canal and external ear normal.     Nose: Nose normal.     Mouth/Throat:     Mouth: Mucous membranes are moist.     Pharynx: Oropharynx is clear. No oropharyngeal exudate.  Eyes:     General: No scleral icterus.    Conjunctiva/sclera: Conjunctivae normal.     Pupils: Pupils are equal, round, and reactive to light.  Cardiovascular:     Rate and Rhythm: Normal rate and regular rhythm.     Pulses: Normal pulses.     Heart sounds: Normal heart sounds. No murmur heard. Pulmonary:     Effort: Pulmonary effort is normal. No respiratory distress.     Breath sounds: Normal breath sounds. No wheezing  or rales.  Musculoskeletal:     Cervical back: Neck supple.     Right lower leg: No edema.     Left lower leg: No edema.  Lymphadenopathy:     Cervical: No cervical adenopathy.  Skin:    General: Skin is warm and dry.  Neurological:     Mental Status: She is alert.     Results for orders placed or performed in visit on 10/15/22  POC COVID-19  Result Value Ref Range   SARS Coronavirus 2 Ag Negative Negative  POCT rapid strep A  Result Value Ref Range   Rapid Strep A Screen Negative Negative        Assessment & Plan:   Problem List Items Addressed This Visit   None Visit Diagnoses     Sore throat    -  Primary   Relevant Orders   POC COVID-19 (Completed)   POCT rapid strep A (Completed)   Nasal congestion       Relevant Orders   POC COVID-19 (Completed)           Upper Respiratory Symptoms Nasal congestion, sore throat, and postnasal drip. No fever. Negative for strep  and COVID-19. -Recommend Flonase nasal spray for postnasal drip.        No orders of the defined types were placed in this encounter.   No follow-ups on file.  Shirlee Latch, MD

## 2022-10-15 NOTE — Telephone Encounter (Signed)
Message from [place] sent at 10/15/2022  1:10 PM EDT  Summary: covid +, symptomatic, requested test   **converted from CRM** Reason for CRM: Pt wants to come and have a covid test, she is symptomatic. Runny nose, scratchy throat        Called pt and pt stated "I am at the doctor's office now.".  Reason for Disposition  Patient already left for the hospital/clinic.    Pt at PCP office  Protocols used: No Contact or Duplicate Contact Call-A-AH

## 2022-10-15 NOTE — Telephone Encounter (Signed)
Summary: covid +, symptomatic, requested test   **converted from CRM** Reason for CRM: Pt wants to come and have a covid test, she is symptomatic. Runny nose, scratchy throat          Returned pt's call. Unable to lm - mail box is full.  Pt has an appt scheduled for this afternoon at 3:20 with Dr. Beryle Flock.

## 2022-10-17 DIAGNOSIS — R079 Chest pain, unspecified: Secondary | ICD-10-CM | POA: Diagnosis not present

## 2022-10-17 DIAGNOSIS — Z5321 Procedure and treatment not carried out due to patient leaving prior to being seen by health care provider: Secondary | ICD-10-CM | POA: Diagnosis not present

## 2022-10-18 DIAGNOSIS — R079 Chest pain, unspecified: Secondary | ICD-10-CM | POA: Diagnosis not present

## 2022-10-18 NOTE — Progress Notes (Signed)
Established patient visit   Patient: [name]   DOB: 1946-01-07   77 y.o. Female  MRN: [id number] Visit Date: 10/11/2022  Today's healthcare provider: Mila Merry, MD   Chief Complaint  Patient presents with   Follow-Up ER   Subjective    Discussed the use of AI scribe software for clinical note transcription with the patient, who gave verbal consent to proceed.  History of Present Illness   The patient, with a history of bowel obstructions and a hernia, presented with abdominal discomfort and gas, which she likened to previous obstruction episodes. She was seen at ER yesterday with no acute findings of abdominal CT, although known ventral hernia and stable splenic lesions were noted. She reported that the discomfort was relieved upon passing gas earlier today. The patient also experienced significant stress due to a friend's terminal cancer diagnosis, which she believed exacerbated her symptoms. She was treated with Gas X and a Miralax mix, which resulted in bowel movements and subsequent relief of discomfort.  The patient also reported dissatisfaction with her current blood pressure medication, diltiazem, due to side effects of lightheadedness and a general feeling of being unwell. She expressed a desire to switch to a different medication. She was started on diltiazem in April due to findings on SVT on Zio monitor.   The patient has a known hernia, which was evaluated prior to Dr. Rutherford Nail retirement. Surgery was initially scheduled but later cancelled as the doctor advised leaving it alone. Recent scans showed no change in the hernia. The patient also has a history of diverticulitis, but recent scans showed no signs of it. Small spots on the spleen were noted, but no changes were observed in comparison to a scan from three years ago.  The patient's financial constraints prevented her from undergoing an MRI previously scheduled for further evaluation of the spleen spots. She  expressed dissatisfaction with the scheduling process and the demeanor of the scheduling staff. The patient is currently managing her conditions with prescription medications, but expressed a desire for changes to her blood pressure medication due to side effects.       Medications: Outpatient Medications Prior to Visit  Medication Sig   albuterol (VENTOLIN HFA) 108 (90 Base) MCG/ACT inhaler TAKE 2 PUFFS BY MOUTH EVERY 6 HOURS AS NEEDED FOR WHEEZE OR SHORTNESS OF BREATH   ALPRAZolam (XANAX) 0.5 MG tablet TAKE 1 TABLET BY MOUTH THREE TIMES DAILY AS NEEDED FOR ANXIETY/SLEEP   aspirin EC 81 MG tablet Take 81 mg by mouth daily.   benzonatate (TESSALON) 200 MG capsule Take 1 capsule (200 mg total) by mouth 3 (three) times daily as needed for cough.   clotrimazole (LOTRIMIN) 1 % cream Apply 1 application topically 2 (two) times daily.   clotrimazole (MYCELEX) 10 MG troche Take 1 tablet (10 mg total) by mouth 5 (five) times daily.   escitalopram (LEXAPRO) 10 MG tablet Take 1/2 tablet every day for 6 days, then increase to 1 tablet every   fluticasone (FLONASE) 50 MCG/ACT nasal spray PLACE 1-2 SPRAYS INTO BOTH NOSTRILS DAILY AS NEEDED FOR ALLERGIES OR RHINITIS.   ibuprofen (ADVIL) 800 MG tablet Take 1 tablet (800 mg total) by mouth daily as needed (severe pain).   lactulose (CHRONULAC) 10 GM/15ML solution Take 45 mLs (30 g total) by mouth daily as needed for mild constipation.   omeprazole (PRILOSEC) 20 MG capsule Take 20 mg by mouth daily as needed (acid reflux).    omeprazole (PRILOSEC) 40  MG capsule Take 1 capsule (40 mg total) by mouth daily.   promethazine (PHENERGAN) 12.5 MG tablet TAKE 1 TO 2 TABLETS BY MOUTH EVERY 8 HOURS AS NEEDED FOR NAUSEA FOR VOMITING   rosuvastatin (CRESTOR) 40 MG tablet Take 1 tablet (40 mg total) by mouth daily.   Spacer/Aero-Holding Chambers (EASIVENT) inhaler Use as needed with inhaler   Spacer/Aero-Holding Chambers (EQ SPACE CHAMBER ANTI-STATIC) DEVI Inhale into the  lungs as needed.   [DISCONTINUED] diltiazem (CARDIZEM CD) 120 MG 24 hr capsule Take 1 capsule by mouth once daily   No facility-administered medications prior to visit.   Review of Systems  Constitutional:  Negative for appetite change, chills, fatigue and fever.  Respiratory:  Negative for chest tightness and shortness of breath.   Cardiovascular:  Negative for chest pain and palpitations.  Gastrointestinal:  Negative for abdominal pain, nausea and vomiting.  Neurological:  Negative for dizziness and weakness.       Objective    BP 137/78 (BP Location: Right Arm, Patient Position: Sitting, Cuff Size: Normal)   Pulse 78   Temp (!) 97.2 F (36.2 C) (Oral)   Ht 5\' 3"  (1.6 m)   Wt 180 lb 12.8 oz (82 kg)   SpO2 100%   BMI 32.03 kg/m   Physical Exam   General: Appearance:    Mildly obese female in no acute distress  Eyes:    PERRL, conjunctiva/corneas clear, EOM's intact       Lungs:     Clear to auscultation bilaterally, respirations unlabored  Heart:    Normal heart rate. Normal rhythm. No murmurs, rubs, or gallops.    MS:   All extremities are intact.    Neurologic:   Awake, alert, oriented x 3. No apparent focal neurological defect.         Assessment & Plan        Abdominal Pain Recent episode of abdominal pain with associated gas and bloating. Pain resolved after bowel movement and passing gas. History of bowel obstructions. No current signs of obstruction. Patient has a known hernia, but no changes noted on recent scan. -Continue current management.  Hypertension Patient reports lightheadedness with current medication (Diltiazem). History of supraventricular tachycardia (SVT). -Switch from Diltiazem to Verapamil. Monitor for side effects and efficacy.  History of diverticulitis. Recent scan showed no signs of active diverticulitis. -Continue current management.  General Health Maintenance / Followup Plans -Print out application for handicap placard. -Schedule  follow-up appointment in two months to assess efficacy of new blood pressure medication.    Return in about 2 months (around 12/11/2022).      Mila Merry, MD  [facility] [phone] (phone) [phone] (fax)  [facility]

## 2022-10-23 ENCOUNTER — Ambulatory Visit (INDEPENDENT_AMBULATORY_CARE_PROVIDER_SITE_OTHER): Payer: Medicare Other | Admitting: Family Medicine

## 2022-10-23 ENCOUNTER — Encounter: Payer: Self-pay | Admitting: Family Medicine

## 2022-10-23 VITALS — BP 142/83 | HR 85 | Wt 179.7 lb

## 2022-10-23 DIAGNOSIS — F39 Unspecified mood [affective] disorder: Secondary | ICD-10-CM | POA: Diagnosis not present
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
## 2022-10-23 NOTE — Assessment & Plan Note (Signed)
Pt notes panic attack 8/29 with ER visit at [facility]; continues on xanax 0.5 mg up to 3x/day and did not start lexapro. Plans to start as previously prescribed at 5 mg x 6 days with plan to transition to 10 mg on day 7. Encourage 1 month f/u.

## 2022-10-29 ENCOUNTER — Other Ambulatory Visit: Payer: Self-pay | Admitting: Family Medicine

## 2022-10-30 ENCOUNTER — Telehealth: Payer: Self-pay | Admitting: Family Medicine

## 2022-10-30 DIAGNOSIS — R1084 Generalized abdominal pain: Secondary | ICD-10-CM

## 2022-10-30 NOTE — Telephone Encounter (Signed)
What is the medical reason for referral

## 2022-10-30 NOTE — Telephone Encounter (Signed)
Referral Request - Has patient seen PCP for this complaint? No. *If NO, is insurance requiring patient see PCP for this issue before PCP can refer them? Referral for which specialty: Gastroenterology Preferred provider/office: [facility]  Reason for referral: Dr Doristine Counter, her previous dr, has retired.

## 2022-10-31 NOTE — Telephone Encounter (Signed)
Patient reports that she was followed by Dr. Jolyn Lent? And that she needs to be follow by GI and that she saw you for her abdominal discomfort that she was having. Reports that she has had 3 surgeries related to her colon.

## 2022-11-01 ENCOUNTER — Other Ambulatory Visit: Payer: Self-pay

## 2022-11-01 ENCOUNTER — Emergency Department (HOSPITAL_COMMUNITY)
Admission: EM | Admit: 2022-11-01 | Discharge: 2022-11-01 | Disposition: A | Discharge status: Home / Self Care | Payer: Medicare Other | Attending: Emergency Medicine | Admitting: Emergency Medicine

## 2022-11-01 ENCOUNTER — Encounter (HOSPITAL_COMMUNITY): Payer: Self-pay

## 2022-11-01 DIAGNOSIS — Z7982 Long term (current) use of aspirin: Secondary | ICD-10-CM | POA: Insufficient documentation

## 2022-11-01 DIAGNOSIS — R1033 Periumbilical pain: Secondary | ICD-10-CM | POA: Diagnosis not present

## 2022-11-01 DIAGNOSIS — R1013 Epigastric pain: Secondary | ICD-10-CM | POA: Diagnosis not present

## 2022-11-01 DIAGNOSIS — R110 Nausea: Secondary | ICD-10-CM | POA: Insufficient documentation

## 2022-11-01 DIAGNOSIS — R109 Unspecified abdominal pain: Secondary | ICD-10-CM

## 2022-11-01 LAB — URINALYSIS, ROUTINE W REFLEX MICROSCOPIC
Bilirubin Urine: NEGATIVE
Glucose, UA: NEGATIVE mg/dL
Ketones, ur: NEGATIVE mg/dL
Leukocytes,Ua: NEGATIVE
Nitrite: NEGATIVE
Protein, ur: NEGATIVE mg/dL
Specific Gravity, Urine: 1.01 (ref 1.005–1.030)
pH: 7.5 (ref 5.0–8.0)

## 2022-11-01 LAB — LIPASE, BLOOD: Lipase: 34 U/L (ref 11–51)

## 2022-11-01 LAB — COMPREHENSIVE METABOLIC PANEL
ALT: 18 U/L (ref 0–44)
AST: 25 U/L (ref 15–41)
Albumin: 4.2 g/dL (ref 3.5–5.0)
Alkaline Phosphatase: 59 U/L (ref 38–126)
Anion gap: 11 (ref 5–15)
BUN: 9 mg/dL (ref 8–23)
CO2: 24 mmol/L (ref 22–32)
Calcium: 9.3 mg/dL (ref 8.9–10.3)
Chloride: 105 mmol/L (ref 98–111)
Creatinine, Ser: 0.97 mg/dL (ref 0.44–1.00)
GFR, Estimated: >60 mL/min (ref >60)
Glucose, Bld: 97 mg/dL (ref 70–99)
Potassium: 4.1 mmol/L (ref 3.5–5.1)
Sodium: 140 mmol/L (ref 135–145)
Total Bilirubin: 1 mg/dL (ref 0.3–1.2)
Total Protein: 7.9 g/dL (ref 6.5–8.1)

## 2022-11-01 LAB — CBC
HCT: 50.8 % — ABNORMAL HIGH (ref 36.0–46.0)
Hemoglobin: 17.2 g/dL — ABNORMAL HIGH (ref 12.0–15.0)
MCH: 31.1 pg (ref 26.0–34.0)
MCHC: 33.9 g/dL (ref 30.0–36.0)
MCV: 91.9 fL (ref 80.0–100.0)
Platelets: 257 10*3/uL (ref 150–400)
RBC: 5.53 MIL/uL — ABNORMAL HIGH (ref 3.87–5.11)
RDW: 13.5 % (ref 11.5–15.5)
WBC: 5.5 10*3/uL (ref 4.0–10.5)
nRBC: 0 % (ref 0.0–0.2)

## 2022-11-01 LAB — URINALYSIS, MICROSCOPIC (REFLEX): Bacteria, UA: NONE SEEN

## 2022-11-01 MED ORDER — ONDANSETRON 4 MG PO TBDP: 4.0000 mg | ORAL_TABLET | Freq: Three times a day (TID) | ORAL | 0 refills | Status: DC | PRN

## 2022-11-01 MED ORDER — OXYCODONE HCL 5 MG PO TABS
5.0000 mg | ORAL_TABLET | ORAL | 0 refills | Status: DC | PRN
Start: 2022-11-01 — End: 2023-06-24

## 2022-11-01 MED ORDER — ONDANSETRON HCL 4 MG/2ML IJ SOLN
4.0000 mg | Freq: Once | INTRAMUSCULAR | Status: AC
  Administered 2022-11-01: 4 mg via INTRAVENOUS
  Filled 2022-11-01: qty 2

## 2022-11-01 MED ORDER — FENTANYL CITRATE PF 50 MCG/ML IJ SOSY
12.5000 ug | PREFILLED_SYRINGE | Freq: Once | INTRAMUSCULAR | Status: AC
  Administered 2022-11-01: 12.5 ug via INTRAVENOUS
  Filled 2022-11-01: qty 1

## 2022-11-01 MED ORDER — OXYCODONE HCL 5 MG PO TABS
5.0000 mg | ORAL_TABLET | ORAL | 0 refills | Status: DC | PRN
Start: 2022-11-01 — End: 2022-11-01

## 2022-11-01 NOTE — ED Provider Notes (Signed)
[place] EMERGENCY DEPARTMENT AT [facility] Provider Note   CSN: [id number] Arrival date & time: 11/01/22  1344     History  No chief complaint on file.   [name] is a 77 y.o. female.  77 year old female presents today for concern of periumbilical abdominal pain.  Ongoing for the past 2 months.  Has had previous ED evaluation for the same and underwent CT imaging.  She is concerned that her hernia is causing the pain and symptoms.  She states she stays nauseated but does not have significant vomiting.  She states her gastroenterologist was monitoring her hernia but he has since retired.  Prior to his retirement she was told that the hernia is stable and does not need intervention.  She has not established new gastroenterologist.  Does not have a Development worker, international aid.  She is able to have bowel movements.  Without vomiting.  The history is provided by the patient. No language interpreter was used.       Home Medications Prior to Admission medications   Medication Sig Start Date End Date Taking? Authorizing Provider  albuterol (VENTOLIN HFA) 108 (90 Base) MCG/ACT inhaler TAKE 2 PUFFS BY MOUTH EVERY 6 HOURS AS NEEDED FOR WHEEZE OR SHORTNESS OF BREATH 09/11/19   Malva Limes, MD  ALPRAZolam Prudy Feeler) 0.5 MG tablet TAKE 1 TABLET BY MOUTH THREE TIMES DAILY AS NEEDED FOR ANXIETY/SLEEP 06/19/22   Malva Limes, MD  aspirin EC 81 MG tablet Take 81 mg by mouth daily.    [provider]  benzonatate (TESSALON) 200 MG capsule Take 1 capsule (200 mg total) by mouth 3 (three) times daily as needed for cough. 09/20/22   Jacky Kindle, FNP  clotrimazole (LOTRIMIN) 1 % cream Apply 1 application topically 2 (two) times daily. 02/20/21   Caro Laroche, DO  clotrimazole (MYCELEX) 10 MG troche Take 1 tablet (10 mg total) by mouth 5 (five) times daily. 07/03/21   Ostwalt, Edmon Crape, PA-C  escitalopram (LEXAPRO) 10 MG tablet Take 1/2 tablet every day for 6 days, then increase to 1  tablet every Patient not taking: Reported on 10/23/2022 07/10/22   Malva Limes, MD  fluticasone (FLONASE) 50 MCG/ACT nasal spray PLACE 1-2 SPRAYS INTO BOTH NOSTRILS DAILY AS NEEDED FOR ALLERGIES OR RHINITIS. 08/21/19   Malva Limes, MD  ibuprofen (ADVIL) 800 MG tablet Take 1 tablet (800 mg total) by mouth daily as needed (severe pain). 05/20/22   Malva Limes, MD  lactulose (CHRONULAC) 10 GM/15ML solution Take 45 mLs (30 g total) by mouth daily as needed for mild constipation. 05/20/22   Malva Limes, MD  omeprazole (PRILOSEC) 20 MG capsule Take 20 mg by mouth daily as needed (acid reflux).     [provider]  omeprazole (PRILOSEC) 40 MG capsule Take 1 capsule (40 mg total) by mouth daily. 12/11/21   Ostwalt, Edmon Crape, PA-C  promethazine (PHENERGAN) 12.5 MG tablet TAKE 1 TO 2 TABLETS BY MOUTH EVERY 8 HOURS AS NEEDED FOR NAUSEA FOR VOMITING 10/30/22   Malva Limes, MD  rosuvastatin (CRESTOR) 40 MG tablet Take 1 tablet (40 mg total) by mouth daily. 11/06/21   Malva Limes, MD  Spacer/Aero-Holding Deretha Emory (EASIVENT) inhaler Use as needed with inhaler 06/02/21   [provider]  Spacer/Aero-Holding Chambers [facility] SPACE CHAMBER ANTI-STATIC) DEVI Inhale into the lungs as needed. 06/02/21   [provider]  verapamil (CALAN-SR) 120 MG CR tablet Take 1 tablet (120 mg total) by mouth  at bedtime. 10/11/22   Malva Limes, MD      Allergies    Levofloxacin, Calcium channel blockers, Amoxicillin, Nickel, and Penicillins    Review of Systems   Review of Systems  Constitutional:  Negative for chills and fever.  Gastrointestinal:  Positive for abdominal pain and nausea. Negative for constipation and vomiting.  Neurological:  Negative for light-headedness.  All other systems reviewed and are negative.   Physical Exam Updated Vital Signs BP (!) 141/80   Pulse 61   Temp 98.2 F (36.8 C) (Oral)   Resp 17   Ht 5\' 3"  (1.6 m)   Wt 81.5 kg   SpO2 100%   BMI 31.83  kg/m  Physical Exam Vitals and nursing note reviewed.  Constitutional:      General: She is not in acute distress.    Appearance: Normal appearance. She is not ill-appearing.  HENT:     Head: Normocephalic and atraumatic.     Nose: Nose normal.  Eyes:     Conjunctiva/sclera: Conjunctivae normal.  Pulmonary:     Effort: Pulmonary effort is normal. No respiratory distress.  Abdominal:     General: There is no distension.     Tenderness: There is no abdominal tenderness. There is no guarding.     Comments: Periumbilical hernia appreciated.  It is easily reducible.  No significant tenderness.  No guarding on exam.  Musculoskeletal:        General: No deformity.  Skin:    Findings: No rash.  Neurological:     Mental Status: She is alert.     ED Results / Procedures / Treatments   Labs (all labs ordered are listed, but only abnormal results are displayed) Labs Reviewed  CBC - Abnormal; Notable for the following components:      Result Value   RBC 5.53 (*)    Hemoglobin 17.2 (*)    HCT 50.8 (*)    All other components within normal limits  URINALYSIS, ROUTINE W REFLEX MICROSCOPIC - Abnormal; Notable for the following components:   Hgb urine dipstick TRACE (*)    All other components within normal limits  LIPASE, BLOOD  COMPREHENSIVE METABOLIC PANEL  URINALYSIS, MICROSCOPIC (REFLEX)    EKG None  Radiology No results found.  Procedures Procedures    Medications Ordered in ED Medications  ondansetron (ZOFRAN) injection 4 mg (4 mg Intravenous Given 11/01/22 1745)  fentaNYL (SUBLIMAZE) injection 12.5 mcg (12.5 mcg Intravenous Given 11/01/22 1745)    ED Course/ Medical Decision Making/ A&P                                 Medical Decision Making Amount and/or Complexity of Data Reviewed Labs: ordered.  Risk Prescription drug management.   77 year old female presents today for concern of hernia.  This is a chronic symptoms ongoing for the past 2 months.   Evaluated in the ED on 8/22 where she had a CT scan done.  This showed multiple small ventral hernias with small bowel but no acute complication.  She states her symptoms have not changed since then.  She is just concerned regarding the ongoing nature of her symptoms.  Has not been referred to a gastroenterologist or general surgeon since this evaluation.  Shared decision making had regarding further imaging.  I did offer CT imaging however patient is in agreement to defer and try symptomatic management since her hernia is reducible.  She  would like a referral to gastroenterologist and general surgeon.  Will provide these.  Will provide symptomatic management with oxycodone, and Zofran.  Will give a dose of fentanyl and Zofran in the emergency department and reevaluate.  Patient is comfortable with discharge.  Discharged in stable condition.  Gastroenterology and surgery referral given.  Short course of pain medication given.  Discussed bowel regimen while on Roxicodone.   Final Clinical Impression(s) / ED Diagnoses Final diagnoses:  Abdominal pain, unspecified abdominal location    Rx / DC Orders ED Discharge Orders          Ordered    oxyCODONE (ROXICODONE) 5 MG immediate release tablet  Every 4 hours PRN        11/01/22 1903    ondansetron (ZOFRAN-ODT) 4 MG disintegrating tablet  Every 8 hours PRN        11/01/22 1903              Marita Kansas, PA-C 11/01/22 1906    Sloan Leiter, DO 11/03/22 2354

## 2022-11-01 NOTE — Telephone Encounter (Cosign Needed)
Patient called in stating that she needs her narcotic prescription sent into [facility] in [place]. Advised Diplomatic Services operational officer to call the [facility] on [address] is to cancel the initial prescription.

## 2022-11-01 NOTE — Discharge Instructions (Addendum)
Your workup today was overall reassuring.  Exam did not show any concerning findings.  I have given you referral to gastroenterologist as well as general surgeon.  Please call their clinic on Monday to schedule a follow-up appointment.  Have also sent in some pain medication along with nausea medication.  The pain medication can make you constipated, be sure that you are taking MiraLAX and other stool softener while you are on this medication.  If any concerning symptoms return to the emergency room.

## 2022-11-01 NOTE — ED Triage Notes (Signed)
Pt c/o epigastric painx62mos. Pt c/o nausea.

## 2022-11-03 DIAGNOSIS — R110 Nausea: Secondary | ICD-10-CM | POA: Diagnosis not present

## 2022-11-03 DIAGNOSIS — K589 Irritable bowel syndrome without diarrhea: Secondary | ICD-10-CM | POA: Diagnosis not present

## 2022-11-03 DIAGNOSIS — I10 Essential (primary) hypertension: Secondary | ICD-10-CM | POA: Diagnosis not present

## 2022-11-03 DIAGNOSIS — R55 Syncope and collapse: Secondary | ICD-10-CM | POA: Diagnosis not present

## 2022-11-03 DIAGNOSIS — I471 Supraventricular tachycardia, unspecified: Secondary | ICD-10-CM | POA: Diagnosis not present

## 2022-11-03 DIAGNOSIS — R109 Unspecified abdominal pain: Secondary | ICD-10-CM | POA: Diagnosis not present

## 2022-11-03 DIAGNOSIS — R140 Abdominal distension (gaseous): Secondary | ICD-10-CM | POA: Diagnosis not present

## 2022-11-03 DIAGNOSIS — D750 Familial erythrocytosis: Secondary | ICD-10-CM | POA: Diagnosis not present

## 2022-11-03 DIAGNOSIS — K449 Diaphragmatic hernia without obstruction or gangrene: Secondary | ICD-10-CM | POA: Diagnosis not present

## 2022-11-03 DIAGNOSIS — I700 Atherosclerosis of aorta: Secondary | ICD-10-CM | POA: Diagnosis not present

## 2022-11-03 DIAGNOSIS — K439 Ventral hernia without obstruction or gangrene: Secondary | ICD-10-CM | POA: Diagnosis not present

## 2022-11-03 DIAGNOSIS — Z880 Allergy status to penicillin: Secondary | ICD-10-CM | POA: Diagnosis not present

## 2022-11-03 DIAGNOSIS — D751 Secondary polycythemia: Secondary | ICD-10-CM | POA: Diagnosis not present

## 2022-11-03 DIAGNOSIS — Z79899 Other long term (current) drug therapy: Secondary | ICD-10-CM | POA: Diagnosis not present

## 2022-11-03 DIAGNOSIS — K219 Gastro-esophageal reflux disease without esophagitis: Secondary | ICD-10-CM | POA: Diagnosis not present

## 2022-11-03 DIAGNOSIS — K5900 Constipation, unspecified: Secondary | ICD-10-CM | POA: Diagnosis not present

## 2022-11-03 DIAGNOSIS — K56609 Unspecified intestinal obstruction, unspecified as to partial versus complete obstruction: Secondary | ICD-10-CM | POA: Diagnosis not present

## 2022-11-03 DIAGNOSIS — R1033 Periumbilical pain: Secondary | ICD-10-CM | POA: Diagnosis not present

## 2022-11-03 DIAGNOSIS — D7389 Other diseases of spleen: Secondary | ICD-10-CM | POA: Diagnosis not present

## 2022-11-03 DIAGNOSIS — Z8673 Personal history of transient ischemic attack (TIA), and cerebral infarction without residual deficits: Secondary | ICD-10-CM | POA: Diagnosis not present

## 2022-11-03 DIAGNOSIS — E785 Hyperlipidemia, unspecified: Secondary | ICD-10-CM | POA: Diagnosis not present

## 2022-11-03 DIAGNOSIS — K5730 Diverticulosis of large intestine without perforation or abscess without bleeding: Secondary | ICD-10-CM | POA: Diagnosis not present

## 2022-11-04 ENCOUNTER — Telehealth: Payer: Self-pay | Admitting: Gastroenterology

## 2022-11-04 DIAGNOSIS — R109 Unspecified abdominal pain: Secondary | ICD-10-CM | POA: Diagnosis not present

## 2022-11-04 DIAGNOSIS — K5900 Constipation, unspecified: Secondary | ICD-10-CM | POA: Diagnosis not present

## 2022-11-04 DIAGNOSIS — R55 Syncope and collapse: Secondary | ICD-10-CM | POA: Diagnosis not present

## 2022-11-04 NOTE — Telephone Encounter (Signed)
Hi Dr. Myrtie Neither,     Supervising Provider 11/04/2022 PM'   Patient requesting to schedule an appointment for constipation and abdominal pain. Patient was recently seen at the emergency department twice. Patient last had a colonoscopy with her provider through [facility] in 2021. Patient requesting to transfer her care due to being referred by the emergency department and also due to her previous provider retiring. Her previous report is in [facility] for you to review and advise on scheduling.    Thank you.

## 2022-11-05 ENCOUNTER — Encounter: Payer: Self-pay | Admitting: *Deleted

## 2022-11-05 NOTE — Telephone Encounter (Signed)
If she wishes to come to our practice, she can be given the next available new patient appointment with me, which is probably 2 to 3 months from now.  Since she lives in [place] and has previously been seen by [facility] clinic GI, she may also want to consider requesting one of the other [facility] GI providers since they have her records, or the [facility] practice at [facility] since they are both closer to her home.  H Danis

## 2022-11-06 ENCOUNTER — Encounter: Payer: Self-pay | Admitting: Gastroenterology

## 2022-11-06 NOTE — Telephone Encounter (Signed)
Patient was scheduled for 12/17 at 11:00

## 2022-11-06 NOTE — Telephone Encounter (Signed)
Called patient to discuss scheduling. Left voicemail.

## 2022-11-07 ENCOUNTER — Telehealth: Payer: Self-pay | Admitting: *Deleted

## 2022-11-07 NOTE — Progress Notes (Signed)
Care Coordination   Note   11/07/2022 Name: [name] MRN: [id number] DOB: 1945/07/04  [name] is a 77 y.o. year old female who sees [name], [name], MD for primary care. I reached out to [name] by phone today to offer care coordination services.  [name] was given information about Care Coordination services today including:   The Care Coordination services include support from the care team which includes your Nurse Coordinator, Clinical Social Worker, or Pharmacist.  The Care Coordination team is here to help remove barriers to the health concerns and goals most important to you. Care Coordination services are voluntary, and the patient may decline or stop services at any time by request to their care team member.   Care Coordination Consent Status: Patient did not agree to participate in care coordination services at this time.  Follow up plan:  pt appreciative but decline need for services at this time   Encounter Outcome:  Patient Refused  Burman Nieves, [facility] Care Coordination Care Guide Direct Dial: [phone]

## 2022-11-08 ENCOUNTER — Ambulatory Visit (INDEPENDENT_AMBULATORY_CARE_PROVIDER_SITE_OTHER): Payer: Medicare Other | Admitting: Family Medicine

## 2022-11-08 ENCOUNTER — Encounter: Payer: Self-pay | Admitting: Family Medicine

## 2022-11-08 VITALS — BP 149/86 | HR 91 | Ht 63.0 in | Wt 174.0 lb

## 2022-11-08 DIAGNOSIS — R194 Change in bowel habit: Secondary | ICD-10-CM

## 2022-11-08 DIAGNOSIS — R109 Unspecified abdominal pain: Secondary | ICD-10-CM | POA: Diagnosis not present

## 2022-11-08 MED ORDER — CILIDINIUM-CHLORDIAZEPOXIDE 2.5-5 MG PO CAPS: 1.0000 | ORAL_CAPSULE | Freq: Two times a day (BID) | ORAL | 3 refills | Status: DC

## 2022-11-08 NOTE — Patient Instructions (Signed)
Please review the attached list of medications and notify my office if there are any errors.   Start taking over the counter probiotics Retail buyer) every day

## 2022-11-08 NOTE — Progress Notes (Unsigned)
Established patient visit   Patient: [name]   DOB: [birth year]   77 y.o. Female  MRN: [id number] Visit Date: 11/08/2022  Today's healthcare provider: Mila Merry, MD   Chief Complaint  Patient presents with   GI Problem   Subjective    Discussed the use of AI scribe software for clinical note transcription with the patient, who gave verbal consent to proceed.  History of Present Illness   The patient, with a history of multiple ventral hernias and anxiety, presents with ongoing stomach discomfort, described as bloating and a sensation of needing to have a bowel movement. The discomfort is not described as painful, but is severe enough to cause fainting. The patient has had several office and ER visit over the last few months and was recently kept overnight. A CT was primarily remarkable for ventral hernias. The patient also reports anxiety and nervousness, and has been taking alprazolam and Lexapro for these symptoms. The patient has been experiencing constipation and has been taking Miralax to help with bowel movements. The patient also mentions personal stressors, which may be contributing to her symptoms.       Medications: Outpatient Medications Prior to Visit  Medication Sig   albuterol (VENTOLIN HFA) 108 (90 Base) MCG/ACT inhaler TAKE 2 PUFFS BY MOUTH EVERY 6 HOURS AS NEEDED FOR WHEEZE OR SHORTNESS OF BREATH   aspirin EC 81 MG tablet Take 81 mg by mouth daily.   benzonatate (TESSALON) 200 MG capsule Take 1 capsule (200 mg total) by mouth 3 (three) times daily as needed for cough.   clotrimazole (LOTRIMIN) 1 % cream Apply 1 application topically 2 (two) times daily.   clotrimazole (MYCELEX) 10 MG troche Take 1 tablet (10 mg total) by mouth 5 (five) times daily.   escitalopram (LEXAPRO) 10 MG tablet Take 1/2 tablet every day for 6 days, then increase to 1 tablet every   fluticasone (FLONASE) 50 MCG/ACT nasal spray PLACE 1-2 SPRAYS INTO BOTH NOSTRILS DAILY AS NEEDED  FOR ALLERGIES OR RHINITIS.   ibuprofen (ADVIL) 800 MG tablet Take 1 tablet (800 mg total) by mouth daily as needed (severe pain).   lactulose (CHRONULAC) 10 GM/15ML solution Take 45 mLs (30 g total) by mouth daily as needed for mild constipation.   omeprazole (PRILOSEC) 20 MG capsule Take 20 mg by mouth daily as needed (acid reflux).    omeprazole (PRILOSEC) 40 MG capsule Take 1 capsule (40 mg total) by mouth daily.   ondansetron (ZOFRAN-ODT) 4 MG disintegrating tablet Take 1 tablet (4 mg total) by mouth every 8 (eight) hours as needed for nausea or vomiting.   oxyCODONE (ROXICODONE) 5 MG immediate release tablet Take 1 tablet (5 mg total) by mouth every 4 (four) hours as needed for severe pain.   promethazine (PHENERGAN) 12.5 MG tablet TAKE 1 TO 2 TABLETS BY MOUTH EVERY 8 HOURS AS NEEDED FOR NAUSEA FOR VOMITING   rosuvastatin (CRESTOR) 40 MG tablet Take 1 tablet (40 mg total) by mouth daily.   Spacer/Aero-Holding Chambers (EASIVENT) inhaler Use as needed with inhaler   Spacer/Aero-Holding Chambers (EQ SPACE CHAMBER ANTI-STATIC) DEVI Inhale into the lungs as needed.   verapamil (CALAN-SR) 120 MG CR tablet Take 1 tablet (120 mg total) by mouth at bedtime.   [DISCONTINUED] ALPRAZolam (XANAX) 0.5 MG tablet TAKE 1 TABLET BY MOUTH THREE TIMES DAILY AS NEEDED FOR ANXIETY/SLEEP   No facility-administered medications prior to visit.   Review of Systems {Insert previous labs (optional):23779} {See past labs  Heme  Chem  Endocrine  Serology  Results Review (optional):1}   Objective    BP (!) 149/86 (BP Location: Right Arm, Patient Position: Sitting, Cuff Size: Normal)   Pulse 91   Ht 5\' 3"  (1.6 m)   Wt 174 lb (78.9 kg)   SpO2 99%   BMI 30.82 kg/m {Insert last BP/Wt (optional):23777}{See vitals history (optional):1}  Physical Exam  General appearance: Mildly obese female, cooperative and in no acute distress Head: Normocephalic, without obvious abnormality, atraumatic Respiratory:  Respirations even and unlabored, normal respiratory rate Extremities: All extremities are intact.  Skin: Skin color, texture, turgor normal. No rashes seen  Psych: Appropriate mood and affect. Neurologic: Mental status: Alert, oriented to person, place, and time, thought content appropriate.   Assessment & Plan        Abdominal Pain Patient reports bloating, discomfort, and feeling of needing to have a bowel movement. Recent CT scan showed hernias. Patient has been experiencing anxiety and stress, which may be contributing to symptoms. -Change lorazepam to Librax to help with anxiety and bowel relaxation. -Start Align probiotic once daily. -Refer to gastroenterology as urgent for further evaluation.  Anxiety Patient reports significant anxiety, shaking, and feeling hot. Currently on lorazepam 0.5mg  and Lexapro, but inconsistent use due to illness. -Change alprazolam to Librax to help with anxiety and bowel relaxation. -Continue Lexapro, encourage consistent use.  Constipation/IBS Patient reports difficulty with bowel movements, has been taking Miralax 2-3 times daily. -Continue Miralax as needed. -Start Align probiotic once daily.    No follow-ups on file.      Mila Merry, MD  [facility] [phone] (phone) [phone] (fax)  [facility]

## 2022-11-12 DIAGNOSIS — I10 Essential (primary) hypertension: Secondary | ICD-10-CM | POA: Diagnosis not present

## 2022-11-12 DIAGNOSIS — Z5181 Encounter for therapeutic drug level monitoring: Secondary | ICD-10-CM | POA: Diagnosis not present

## 2022-11-12 DIAGNOSIS — D7389 Other diseases of spleen: Secondary | ICD-10-CM | POA: Diagnosis not present

## 2022-11-12 DIAGNOSIS — Z79899 Other long term (current) drug therapy: Secondary | ICD-10-CM | POA: Diagnosis not present

## 2022-11-12 DIAGNOSIS — E785 Hyperlipidemia, unspecified: Secondary | ICD-10-CM | POA: Diagnosis not present

## 2022-11-12 DIAGNOSIS — R1013 Epigastric pain: Secondary | ICD-10-CM | POA: Diagnosis not present

## 2022-11-12 DIAGNOSIS — D739 Disease of spleen, unspecified: Secondary | ICD-10-CM | POA: Diagnosis not present

## 2022-11-12 DIAGNOSIS — J9811 Atelectasis: Secondary | ICD-10-CM | POA: Diagnosis not present

## 2022-11-12 DIAGNOSIS — R9431 Abnormal electrocardiogram [ECG] [EKG]: Secondary | ICD-10-CM | POA: Diagnosis not present

## 2022-11-12 DIAGNOSIS — K5730 Diverticulosis of large intestine without perforation or abscess without bleeding: Secondary | ICD-10-CM | POA: Diagnosis not present

## 2022-11-13 ENCOUNTER — Other Ambulatory Visit: Payer: Self-pay | Admitting: Family Medicine

## 2022-11-13 DIAGNOSIS — F419 Anxiety disorder, unspecified: Secondary | ICD-10-CM

## 2022-11-13 NOTE — Telephone Encounter (Unsigned)
Copied from CRM [id number]. Topic: General - Other >> Nov 13, 2022  8:06 AM Everette C wrote: Reason for CRM: Medication Refill - Medication: ALPRAZolam Prudy Feeler) tablet 0.5 mg   [[id number]]   Has the patient contacted their pharmacy? Yes.   (Agent: If no, request that the patient contact the pharmacy for the refill. If patient does not wish to contact the pharmacy document the reason why and proceed with request.) (Agent: If yes, when and what did the pharmacy advise?)  Preferred Pharmacy (with phone number or street name): [facility] [address] ([address]),  - [address]) [place] [address] Phone: [phone] Fax: [phone] Hours: Not open 24 hours   Has the patient been seen for an appointment in the last year OR does the patient have an upcoming appointment? Yes.    Agent: Please be advised that RX refills may take up to 3 business days. We ask that you follow-up with your pharmacy.

## 2022-11-14 NOTE — Telephone Encounter (Signed)
Requested medication (s) are due for refill today: No  Requested medication (s) are on the active medication list: No  Last refill:    Future visit scheduled: Yes  Notes to clinic:  Not delegated. D/C 11/08/22.    Requested Prescriptions  Pending Prescriptions Disp Refills   ALPRAZolam (XANAX) 0.5 MG tablet 90 tablet 3     Not Delegated - Psychiatry: Anxiolytics/Hypnotics 2 Failed - 11/13/2022  8:24 AM      Failed - This refill cannot be delegated      Failed - Urine Drug Screen completed in last 360 days      Passed - Patient is not pregnant      Passed - Valid encounter within last 6 months    Recent Outpatient Visits           6 days ago Abdominal pain, unspecified abdominal location   [facility] Malva Limes, MD   3 weeks ago Mood disorder [facility])   [facility] Merita Norton T, FNP   1 month ago Sore throat   [facility] [place], Marzella Schlein, MD   1 month ago Essential hypertension   [facility] Malva Limes, MD   1 month ago Exposure to COVID-19 virus   [facility] Jacky Kindle, FNP       Future Appointments             In 3 weeks [name], [name], MD [facility]

## 2022-11-18 ENCOUNTER — Encounter: Payer: Self-pay | Admitting: Family Medicine

## 2022-11-18 ENCOUNTER — Ambulatory Visit (INDEPENDENT_AMBULATORY_CARE_PROVIDER_SITE_OTHER): Payer: Medicare Other | Admitting: Family Medicine

## 2022-11-18 DIAGNOSIS — F419 Anxiety disorder, unspecified: Secondary | ICD-10-CM

## 2022-11-18 MED ORDER — ALPRAZOLAM 1 MG PO TABS: 0.5000 mg | ORAL_TABLET | Freq: Three times a day (TID) | ORAL | 1 refills | Status: DC | PRN

## 2022-11-18 MED ORDER — DICYCLOMINE HCL 10 MG PO CAPS: 10.0000 mg | ORAL_CAPSULE | Freq: Three times a day (TID) | ORAL | 1 refills | Status: DC

## 2022-11-18 NOTE — Progress Notes (Signed)
Established patient visit   Patient: DIMITRI DSOUZA   DOB: Apr 09, 1945   77 y.o. Female  MRN: [id number] Visit Date: 11/18/2022  Today's healthcare provider: Mila Merry, MD   Chief Complaint  Patient presents with   Medical Management of Chronic Issues    Panic attacks have become worse, new medication isnt working    Subjective    Discussed the use of AI scribe software for clinical note transcription with the patient, who gave verbal consent to proceed.  History of Present Illness   The patient, with a history of anxiety and suspected IBS presents with ongoing anxiety and panic attacks. She reports that Librax prescribed at last visit did not alleviate her symptoms, and she expresses a desire to return to using Xanax, despite her initial reluctance. She acknowledges that she had been taking more Xanax than prescribed to manage her symptoms. She also reports experiencing three panic attacks on her birthday, which led to her spending the entire day in bed.  In addition to her anxiety, the patient reports ongoing stomach issues. She is unsure if Xanax was helping with this issue. She also reports constipation, but it is unclear if this is a side effect of her medication.  The patient's anxiety appears to be exacerbated by recent personal stressors, including the death of a close friend from breast cancer. She expresses uncertainty about her ability to attend the upcoming funeral due to her current mental state.       Medications: Outpatient Medications Prior to Visit  Medication Sig   albuterol (VENTOLIN HFA) 108 (90 Base) MCG/ACT inhaler TAKE 2 PUFFS BY MOUTH EVERY 6 HOURS AS NEEDED FOR WHEEZE OR SHORTNESS OF BREATH   aspirin EC 81 MG tablet Take 81 mg by mouth daily.   benzonatate (TESSALON) 200 MG capsule Take 1 capsule (200 mg total) by mouth 3 (three) times daily as needed for cough.   clotrimazole (LOTRIMIN) 1 % cream Apply 1 application topically 2 (two) times daily.    clotrimazole (MYCELEX) 10 MG troche Take 1 tablet (10 mg total) by mouth 5 (five) times daily.   escitalopram (LEXAPRO) 10 MG tablet Take 1/2 tablet every day for 6 days, then increase to 1 tablet every   fluticasone (FLONASE) 50 MCG/ACT nasal spray PLACE 1-2 SPRAYS INTO BOTH NOSTRILS DAILY AS NEEDED FOR ALLERGIES OR RHINITIS.   ibuprofen (ADVIL) 800 MG tablet Take 1 tablet (800 mg total) by mouth daily as needed (severe pain).   lactulose (CHRONULAC) 10 GM/15ML solution Take 45 mLs (30 g total) by mouth daily as needed for mild constipation.   omeprazole (PRILOSEC) 20 MG capsule Take 20 mg by mouth daily as needed (acid reflux).    omeprazole (PRILOSEC) 40 MG capsule Take 1 capsule (40 mg total) by mouth daily.   ondansetron (ZOFRAN-ODT) 4 MG disintegrating tablet Take 1 tablet (4 mg total) by mouth every 8 (eight) hours as needed for nausea or vomiting.   oxyCODONE (ROXICODONE) 5 MG immediate release tablet Take 1 tablet (5 mg total) by mouth every 4 (four) hours as needed for severe pain.   promethazine (PHENERGAN) 12.5 MG tablet TAKE 1 TO 2 TABLETS BY MOUTH EVERY 8 HOURS AS NEEDED FOR NAUSEA FOR VOMITING   rosuvastatin (CRESTOR) 40 MG tablet Take 1 tablet (40 mg total) by mouth daily.   Spacer/Aero-Holding Chambers (EASIVENT) inhaler Use as needed with inhaler   Spacer/Aero-Holding Chambers (EQ SPACE CHAMBER ANTI-STATIC) DEVI Inhale into the lungs as needed.  verapamil (CALAN-SR) 120 MG CR tablet Take 1 tablet (120 mg total) by mouth at bedtime.   [DISCONTINUED] clidinium-chlordiazePOXIDE (LIBRAX) 5-2.5 MG capsule Take 1 capsule by mouth 2 (two) times daily.   No facility-administered medications prior to visit.        Objective    BP 130/84 (BP Location: Right Arm, Patient Position: Sitting, Cuff Size: Large)   Pulse 87   Ht 5\' 3"  (1.6 m)   Wt 172 lb 4.8 oz (78.2 kg)   SpO2 100%   BMI 30.52 kg/m   Physical Exam  General appearance: Mildly obese female, cooperative and in no  acute distress Head: Normocephalic, without obvious abnormality, atraumatic Respiratory: Respirations even and unlabored, normal respiratory rate Extremities: All extremities are intact.  Skin: Skin color, texture, turgor normal. No rashes seen  Psych: Appropriate mood and affect. Neurologic: Mental status: Alert, oriented to person, place, and time, thought content appropriate.   Assessment & Plan        Anxiety/Panic Disorder Multiple panic attacks, exacerbated by recent personal stressors. Previous trial of Librax was ineffective. Patient has a history of using Xanax, but required higher doses than prescribed for symptom control. -Discontinue Librax. -Prescribe Xanax 1mg , instruct patient to take as needed, and can halve the dose if full milligram is not required.  Irritable Bowel Syndrome Unclear if symptoms are improved with Librax or Xanax. Patient reports possible constipation. -Prescribe Dicyclomine to be taken with Xanax as needed for stomach symptoms.  Follow-up in 2 weeks on 12/04/2022 at 940am to assess response to medication changes and ongoing issues.    No follow-ups on file.      Mila Merry, MD  [facility] [phone] (phone) [phone] (fax)  [facility]

## 2022-11-23 ENCOUNTER — Other Ambulatory Visit: Payer: Self-pay | Admitting: Family Medicine

## 2022-11-23 DIAGNOSIS — I471 Supraventricular tachycardia, unspecified: Secondary | ICD-10-CM

## 2022-11-25 NOTE — Telephone Encounter (Signed)
Requested Prescriptions  Refused Prescriptions Disp Refills   diltiazem (CARDIZEM CD) 120 MG 24 hr capsule [Pharmacy Med Name: dilTIAZem HCl ER Coated Beads 120 MG Oral Capsule Extended Release 24 Hour] 30 capsule 0    Sig: Take 1 capsule by mouth once daily     Cardiovascular: Calcium Channel Blockers 3 Passed - 11/23/2022  6:50 AM      Passed - ALT in normal range and within 360 days    ALT  Date Value Ref Range Status  11/01/2022 18 0 - 44 U/L Final   SGPT (ALT)  Date Value Ref Range Status  06/05/2014 12 (L) U/L Final    Comment:    14-54 NOTE: New Reference Range  04/26/14          Passed - AST in normal range and within 360 days    AST  Date Value Ref Range Status  11/01/2022 25 15 - 41 U/L Final   SGOT(AST)  Date Value Ref Range Status  06/05/2014 21 U/L Final    Comment:    15-41 NOTE: New Reference Range  04/26/14          Passed - Cr in normal range and within 360 days    Creatinine  Date Value Ref Range Status  06/05/2014 0.83 mg/dL Final    Comment:    9.14-7.82 NOTE: New Reference Range  04/26/14    Creatinine, Ser  Date Value Ref Range Status  11/01/2022 0.97 0.44 - 1.00 mg/dL Final         Passed - Last BP in normal range    BP Readings from Last 1 Encounters:  11/18/22 130/84         Passed - Last Heart Rate in normal range    Pulse Readings from Last 1 Encounters:  11/18/22 87         Passed - Valid encounter within last 6 months    Recent Outpatient Visits           1 week ago Anxiety   [facility] [place] [facility] Malva Limes, MD   2 weeks ago Abdominal pain, unspecified abdominal location   [facility] Malva Limes, MD   1 month ago Mood disorder [facility])   [place] [facility] Jacky Kindle, FNP   1 month ago Sore throat   [place] [facility] [place], Marzella Schlein, MD   1 month ago Essential hypertension   [place] [facility]  Malva Limes, MD       Future Appointments             In 1 week [name], [name], MD [facility]   In 2 weeks Sherrie Mustache, [name], MD [facility], [facility]

## 2022-12-04 ENCOUNTER — Ambulatory Visit: Payer: Medicare Other | Admitting: Family Medicine

## 2022-12-11 ENCOUNTER — Ambulatory Visit: Payer: Medicare Other | Admitting: Family Medicine

## 2022-12-20 ENCOUNTER — Encounter: Payer: Self-pay | Admitting: Family Medicine

## 2022-12-20 ENCOUNTER — Ambulatory Visit (INDEPENDENT_AMBULATORY_CARE_PROVIDER_SITE_OTHER): Payer: Medicare Other | Admitting: Family Medicine

## 2022-12-20 VITALS — BP 124/75 | HR 82 | Ht 63.0 in | Wt 177.0 lb

## 2022-12-20 DIAGNOSIS — E7800 Pure hypercholesterolemia, unspecified: Secondary | ICD-10-CM | POA: Diagnosis not present

## 2022-12-20 DIAGNOSIS — R7303 Prediabetes: Secondary | ICD-10-CM

## 2022-12-20 DIAGNOSIS — K589 Irritable bowel syndrome without diarrhea: Secondary | ICD-10-CM | POA: Diagnosis not present

## 2022-12-20 DIAGNOSIS — F419 Anxiety disorder, unspecified: Secondary | ICD-10-CM

## 2022-12-20 NOTE — Progress Notes (Unsigned)
Established patient visit   Patient: [name]   DOB: March 07, 1945   77 y.o. Female  MRN: [id number] Visit Date: 12/20/2022  Today's healthcare provider: Mila Merry, MD   No chief complaint on file.  Subjective    Discussed the use of AI scribe software for clinical note transcription with the patient, who gave verbal consent to proceed.  History of Present Illness   The patient, with a history of chronic anxiety, panic disorder, and irritable bowel syndrome (IBS), presents for a follow-up visit. She was previously transitioned from Librax to alprazolam and initiated on dicyclomine for IBS management about 1 month ago. The patient reports persistent stomach discomfort, which is not daily but required medication on the day of the visit. She expresses satisfaction with the dicyclomine, which she takes as needed and finds effective.  The patient also discusses her alprazolam regimen, previously taking 0.5 mg three times daily. She expresses concern about the recent prescription, which only contained 60 tablets, and requests an increase to 90 tablets per month. The patient explains that she occasionally feels jittery around midday, necessitating an additional dose to maintain stability.  The patient's other medications are reportedly well-tolerated, and she expresses no other health concerns at this time. She acknowledges the need for upcoming routine checks on her blood sugar and cholesterol levels, given a previous prediabetic reading.       Medications: Outpatient Medications Prior to Visit  Medication Sig   albuterol (VENTOLIN HFA) 108 (90 Base) MCG/ACT inhaler TAKE 2 PUFFS BY MOUTH EVERY 6 HOURS AS NEEDED FOR WHEEZE OR SHORTNESS OF BREATH   ALPRAZolam (XANAX) 1 MG tablet Take 0.5-1 tablets (0.5-1 mg total) by mouth 3 (three) times daily as needed for anxiety.   aspirin EC 81 MG tablet Take 81 mg by mouth daily.   benzonatate (TESSALON) 200 MG capsule Take 1 capsule (200 mg  total) by mouth 3 (three) times daily as needed for cough.   clotrimazole (LOTRIMIN) 1 % cream Apply 1 application topically 2 (two) times daily.   clotrimazole (MYCELEX) 10 MG troche Take 1 tablet (10 mg total) by mouth 5 (five) times daily.   dicyclomine (BENTYL) 10 MG capsule Take 1 capsule (10 mg total) by mouth 3 (three) times daily before meals. As needed for stomach pains   escitalopram (LEXAPRO) 10 MG tablet Take 1/2 tablet every day for 6 days, then increase to 1 tablet every   fluticasone (FLONASE) 50 MCG/ACT nasal spray PLACE 1-2 SPRAYS INTO BOTH NOSTRILS DAILY AS NEEDED FOR ALLERGIES OR RHINITIS.   ibuprofen (ADVIL) 800 MG tablet Take 1 tablet (800 mg total) by mouth daily as needed (severe pain).   lactulose (CHRONULAC) 10 GM/15ML solution Take 45 mLs (30 g total) by mouth daily as needed for mild constipation.   omeprazole (PRILOSEC) 20 MG capsule Take 20 mg by mouth daily as needed (acid reflux).    omeprazole (PRILOSEC) 40 MG capsule Take 1 capsule (40 mg total) by mouth daily.   ondansetron (ZOFRAN-ODT) 4 MG disintegrating tablet Take 1 tablet (4 mg total) by mouth every 8 (eight) hours as needed for nausea or vomiting.   oxyCODONE (ROXICODONE) 5 MG immediate release tablet Take 1 tablet (5 mg total) by mouth every 4 (four) hours as needed for severe pain.   promethazine (PHENERGAN) 12.5 MG tablet TAKE 1 TO 2 TABLETS BY MOUTH EVERY 8 HOURS AS NEEDED FOR NAUSEA FOR VOMITING   rosuvastatin (CRESTOR) 40 MG tablet Take 1 tablet (  40 mg total) by mouth daily.   Spacer/Aero-Holding Chambers (EASIVENT) inhaler Use as needed with inhaler   Spacer/Aero-Holding Chambers (EQ SPACE CHAMBER ANTI-STATIC) DEVI Inhale into the lungs as needed.   verapamil (CALAN-SR) 120 MG CR tablet Take 1 tablet (120 mg total) by mouth at bedtime.   No facility-administered medications prior to visit.   Review of Systems {Insert previous labs (optional):23779} {See past labs  Heme  Chem  Endocrine   Serology  Results Review (optional):1}   Objective    BP 124/75 (BP Location: Left Arm, Patient Position: Sitting, Cuff Size: Normal)   Pulse 82   Ht 5\' 3"  (1.6 m)   Wt 177 lb (80.3 kg)   SpO2 96%   BMI 31.35 kg/m {Insert last BP/Wt (optional):23777}{See vitals history (optional):1}  Physical Exam  Physical Exam        No results found for any visits on 12/20/22.  Assessment & Plan        Anxiety and Panic Disorder Reports benefit from alprazolam but inconsistent dosing due to insufficient quantity. -Increase alprazolam prescription to 90 tablets per month to allow for up to 3 doses per day as needed. -Contact pharmacy to adjust prescription quantity.  Irritable Bowel Syndrome Reports improvement with dicyclomine, taken as needed. -Continue dicyclomine as needed.  Prediabetes Last check showed blood sugar in prediabetic range. -Schedule follow-up in three months to check blood sugar and cholesterol levels.    Return in about 12 weeks (around 03/14/2023) for Hypertension. prediabetes.      Mila Merry, MD  [facility] [phone] (phone) [phone] (fax)  [facility]

## 2022-12-20 NOTE — Patient Instructions (Signed)
.   Please review the attached list of medications and notify my office if there are any errors.   . Please bring all of your medications to every appointment so we can make sure that our medication list is the same as yours.   

## 2022-12-28 DIAGNOSIS — K5900 Constipation, unspecified: Secondary | ICD-10-CM | POA: Diagnosis not present

## 2022-12-30 ENCOUNTER — Other Ambulatory Visit: Payer: Self-pay | Admitting: Family Medicine

## 2022-12-30 DIAGNOSIS — F39 Unspecified mood [affective] disorder: Secondary | ICD-10-CM

## 2022-12-30 NOTE — Telephone Encounter (Signed)
Please advise 

## 2023-01-02 ENCOUNTER — Other Ambulatory Visit: Payer: Self-pay | Admitting: Family Medicine

## 2023-01-02 DIAGNOSIS — F419 Anxiety disorder, unspecified: Secondary | ICD-10-CM

## 2023-01-02 NOTE — Telephone Encounter (Signed)
Medication Refill -  Most Recent Primary Care Visit:  Provider: Malva Limes  Department: [facility]  Visit Type: OFFICE VISIT  Date: 12/20/2022  Medication:  ALPRAZolam Prudy Feeler) 1 MG tablet  Has the patient contacted their pharmacy? Yes, advised to contact PCP. Pharmacy was sending over a request for prescription  Is this the correct pharmacy for this prescription? Yes, the one listed below  If no, delete pharmacy and type the correct one.  This is the patient's preferred pharmacy:  [facility] [address] [place] - [address] [place]) [place] [address] Phone: [phone] Fax: [phone]   Has the prescription been filled recently?  Last fill on 9.30.24  Is the patient out of the medication?  Patient is getting low, maybe 5 tablets left.  Has the patient been seen for an appointment in the last year OR does the patient have an upcoming appointment? Yes. F/U scheduled with PCP on 1.24.2025

## 2023-01-03 MED ORDER — ALPRAZOLAM 1 MG PO TABS
0.5000 mg | ORAL_TABLET | Freq: Three times a day (TID) | ORAL | 4 refills | Status: DC | PRN
Start: 2023-01-03 — End: 2023-07-14

## 2023-01-03 NOTE — Telephone Encounter (Signed)
Requested medication (s) are due for refill today: yes  Requested medication (s) are on the active medication list: yes  Last refill:  11/18/22  Future visit scheduled: yes  Notes to clinic:  Unable to refill per protocol, cannot delegate.      Requested Prescriptions  Pending Prescriptions Disp Refills   ALPRAZolam (XANAX) 1 MG tablet 60 tablet 1    Sig: Take 0.5-1 tablets (0.5-1 mg total) by mouth 3 (three) times daily as needed for anxiety.     Not Delegated - Psychiatry: Anxiolytics/Hypnotics 2 Failed - 01/02/2023  3:35 PM      Failed - This refill cannot be delegated      Failed - Urine Drug Screen completed in last 360 days      Passed - Patient is not pregnant      Passed - Valid encounter within last 6 months    Recent Outpatient Visits           2 weeks ago Irritable bowel syndrome, unspecified type   [facility] Malva Limes, MD   1 month ago Anxiety   [place] [facility] Malva Limes, MD   1 month ago Abdominal pain, unspecified abdominal location   [facility], MD   2 months ago Mood disorder [facility])   [place] [facility] Merita Norton T, FNP   2 months ago Sore throat    [facility], Marzella Schlein, MD       Future Appointments             In 2 months [name], [name], MD [facility], [facility]

## 2023-01-03 NOTE — Telephone Encounter (Signed)
Requested medication (s) are due for refill today: Yes  Requested medication (s) are on the active medication list: Yes  Last refill:  11/18/22  Future visit scheduled: Yes  Notes to clinic:  Not delegated.    Requested Prescriptions  Pending Prescriptions Disp Refills   ALPRAZolam (XANAX) 1 MG tablet [Pharmacy Med Name: ALPRAZolam 1 MG Oral Tablet] 60 tablet 0    Sig: TAKE ONE-HALF TO ONE TABLET BY MOUTH THREE TIMES DAILY AS NEEDED FOR ANXIETY. TAKE IN PLACE OF LIBRAX.     Not Delegated - Psychiatry: Anxiolytics/Hypnotics 2 Failed - 01/02/2023  3:02 PM      Failed - This refill cannot be delegated      Failed - Urine Drug Screen completed in last 360 days      Passed - Patient is not pregnant      Passed - Valid encounter within last 6 months    Recent Outpatient Visits           2 weeks ago Irritable bowel syndrome, unspecified type   [place] [facility] Malva Limes, MD   1 month ago Anxiety    [facility], MD   1 month ago Abdominal pain, unspecified abdominal location   [facility] Mal[facility] L[facility], MD   2 months ago Mood disorder [facility])   [place] [facility] Merita Norton T, FNP   2 months ago Sore throat   [place] [facility] [place], Marzella Schlein, MD       Future Appointments             In 2 months [name], [name], MD [facility], [facility]

## 2023-01-22 NOTE — Telephone Encounter (Signed)
This encounter was created in error - please disregard.

## 2023-02-03 NOTE — Progress Notes (Signed)
Chief Complaint: Abdominal pain and bowel habit changes  HPI: Patient is a 77 year old African-American female patient who presents as a new patient with past medical history of anxiety, depression, hypertension, hyperlipidemia,syncope, diverticulosis,open transverse colectomy in 2014 (precancerous), recurrent SBOs, and multiple ventral hernias, who was referred to me by Malva Limes, MD on 11/08/2022 for a complaint of abdominal pain and bowel habit changes.  On 11/08/2022 patient was seen by Dr. Sherrie Mustache.  At that time she reported bloating, discomfort and feeling of needing to have a bowel movement.  She is also experiencing anxiety and stress which may have contributed to her symptoms.  At that time her lorazepam was switched to Librax.  She was instructed to start align probiotic po daily.  Patient taking MiraLAX 2-3 times daily.  On 11/12/2022 patient went to [facility] ED for abdominal pain.  At that time she states after eating she becomes bloated and feels the need to defecate.  States that on the toilet she often becomes lightheaded and has had multiple syncopal episodes.  Applying cool rags to her head and abdomen usually results in resolution of her symptoms.  The CT scan and labs that were done did not show any evidence of a heart problem or electrolyte imbalance.  No evidence of dehydration or malnutrition.  No evidence of infection. Recommendations to follow-up with PCP and GI as well is working with stress management.  On 11/18/22 and 12/20/2022 patient followed up with Dr. Sherrie Mustache.  Patient has had multiple panic attacks that have improved with taking alprazolam.  She was also prescribed dicyclomine to help with the abdominal pain which at that time she reported was helping.   Interval History:    Patient presents with several gastrointestinal complaints.  She reports her main complaint is of chronic constipation.  Patient reports she has been to the ED over 6 times for domino  discomfort that resulted in passing out.  Patient reports she will have the urge to go to the restroom 2-3 times per day.  Times she will have a large amount of stool and other times she will have small amount.  She reports at times she will have the urge to go with no result.  Patient has tried over-the-counter MiraLAX and stool softeners but states they make her very nauseated.  The only thing she is doing right now for the constipation is drinking hot tea.  Patient describes the discomfort as generalized with bloating.  Patient was prescribed dicyclomine at one of her follow-up visits with PCP however reports she is not taking.  Patient states she does avoid certain foods which seem to exacerbate her symptoms.  He also reports her symptoms seem to increase with periods of anxiety and stress.  Patient reports she has had several panic attacks due to stress at home and with family.  Patient states she has been going to counseling.  Patient is taking over-the-counter ibuprofen 800 mg as needed aches and pains.  Patient has prescription for oxycodone for bulging disks but states she takes very seldom.    Patient also reports history of GERD. Patient is currently taking Omeprazole 40 mg as needed. Patient admits pyrosis few times a month.Patient admits she chokes intermittently if she doesn't chew food thoroughly. Patient reports she will wake up from her sleep at times with dizziness and nausea. She mitts she will eat right before bed at times. Surgical history includes colectomy (2014) 11 inches removed, Exp lap with LOA x2, SOB, ventral hernias,  and partial hysterectomy. Family medical history includes lung cancer in father and colon cancer in maternal grandmother. Patient reports her last colonoscopy was 3 months ago at [facility], per patient was normal.  No records on file, will request.  Prior to that patient had colonoscopy at [facility] clinic in 2021 - normal per patient. Wt Readings from Last 3  Encounters:  02/04/23 180 lb 9.6 oz (81.9 kg)  12/20/22 177 lb (80.3 kg)  11/18/22 172 lb 4.8 oz (78.2 kg)   Past Medical History:  Diagnosis Date   Abdominal hernia    Anxiety    Arthritis    lower spine   Back pain    lower back - S/P lifting injury   Closed fracture of lateral malleolus of right fibula 10/11/2020   Complication of anesthesia    makes hair brittle   GERD (gastroesophageal reflux disease)    Hyperlipidemia    Hypertension    Neuromuscular disorder (HCC)    numbness legs and feet s/p lower back injury   Partial bowel obstruction (HCC) 02/04/2018   Partial small bowel obstruction (HCC) 02/04/2018   SBO (small bowel obstruction) (HCC) 03/13/2017   Stroke (HCC)    "mini - strokes" 2009 - no deficit   Vertigo    none recently   Vitamin D deficiency    Wears contact lenses    Past Surgical History:  Procedure Laterality Date   ABDOMINAL HYSTERECTOMY  1987   CARDIAC CATHETERIZATION  02/2007   COLON SURGERY  03/12/2012   Dr Cecelia Byars   COLONOSCOPY WITH PROPOFOL N/A 09/26/2014   Procedure: COLONOSCOPY WITH PROPOFOL;  Surgeon: Midge Minium, MD;  Location: [facility];  Service: Endoscopy;  Laterality: N/A;  marker (tattoo) used in colon   COLONOSCOPY WITH PROPOFOL N/A 10/27/2019   Procedure: COLONOSCOPY WITH PROPOFOL;  Surgeon: Earline Mayotte, MD;  Location: [facility];  Service: Endoscopy;  Laterality: N/A;   ESOPHAGEAL DILATION N/A 02/02/2016   Procedure: ESOPHAGEAL DILATION;  Surgeon: Midge Minium, MD;  Location: [facility];  Service: Endoscopy;  Laterality: N/A;   ESOPHAGOGASTRODUODENOSCOPY (EGD) WITH PROPOFOL N/A 02/02/2016   Procedure: ESOPHAGOGASTRODUODENOSCOPY (EGD) WITH PROPOFOL;  Surgeon: Midge Minium, MD;  Location: [facility];  Service: Endoscopy;  Laterality: N/A;   LAPAROSCOPIC ABDOMINAL EXPLORATION N/A 11/08/2019   Procedure: LAPAROSCOPIC ABDOMINAL EXPLORATION;  Surgeon: Earline Mayotte, MD;  Location: [facility] OR[facility];  Service:  General;  Laterality: N/A;  POSSIBLE LAPAROTOMY   POLYPECTOMY  09/26/2014   Procedure: POLYPECTOMY;  Surgeon: Midge Minium, MD;  Location: [facility];  Service: Endoscopy;;   TUBAL LIGATION  1972   VENTRAL HERNIA REPAIR N/A 11/08/2019   Procedure: HERNIA REPAIR VENTRAL ADULT;  Surgeon: Earline Mayotte, MD;  Location: [facility] OR[facility];  Service: General;  Laterality: N/A;  possible ventral hernia repair   Allergies as of 02/04/2023 - Review Complete 02/04/2023  Allergen Reaction Noted   Levofloxacin  12/19/2016   Calcium channel blockers  08/01/2014   Amoxicillin Other (See Comments) 08/01/2014   Nickel Rash 09/16/2014   Penicillins Other (See Comments) 08/01/2014   Family History  Problem Relation Age of Onset   Diabetes Mother    Hypertension Mother    Mental illness Mother    Heart failure Mother 61   Cancer Father        lung cancer   Drug abuse Brother    Cancer Brother    Multiple sclerosis Brother   Review of Systems:    Constitutional: No weight loss,  fever, chills, weakness or fatigue HEENT: Eyes: No change in vision               Ears, Nose, Throat:  No change in hearing or congestion Skin: No rash or itching Cardiovascular: No chest pain, chest pressure or palpitations   Respiratory: No SOB or cough Gastrointestinal: See HPI and otherwise negative Genitourinary: No dysuria or change in urinary frequency Neurological: No headache, dizziness or syncope Musculoskeletal: No new muscle or joint pain Hematologic: No bleeding or bruising  Physical Exam:  Vital signs: BP 114/80   Pulse 80   Ht 5\' 3"  (1.6 m)   Wt 180 lb 9.6 oz (81.9 kg)   BMI 31.99 kg/m   Constitutional:   Pleasant African-American female appears to be in NAD, Well developed, Well nourished, alert and cooperative Neck:  Supple Throat: Oral cavity and pharynx without inflammation, swelling or lesion.  Respiratory: Respirations even and unlabored. Lungs clear to auscultation bilaterally.   No wheezes,  crackles, or rhonchi.  Cardiovascular: Normal S1, S2. Regular rate and rhythm. No peripheral edema, cyanosis or pallor.  Gastrointestinal:  Soft, nondistended, generalized tenderness with palpation. No rebound or guarding.  Hypoactive bowel sounds. No appreciable masses or hepatomegaly. Rectal:  Not performed.  Msk:  Symmetrical without gross deformities. Without edema, no deformity or joint abnormality.  Neurologic:  Alert and  oriented x4;  grossly normal neurologically.  Skin:   Dry and intact without significant lesions or rashes. Psychiatric: Oriented to person, place and time. Demonstrates good judgement and reason without abnormal affect or behaviors.  Patient does report intermittent episodes of anxiety.  RELEVANT LABS AND IMAGING: CBC    Latest Ref Rng & Units 11/01/2022    2:02 PM 10/15/2021    3:33 PM 04/02/2020    2:58 AM  CBC  WBC 4.0 - 10.5 K/uL 5.5  5.2  6.4   Hemoglobin 12.0 - 15.0 g/dL 29.5  18.8  41.6   Hematocrit 36.0 - 46.0 % 50.8  50.7  45.1   Platelets 150 - 400 K/uL 257  268  203     CMP     Component Value Date/Time   NA 140 11/01/2022 1402   NA 140 10/15/2021 1533   NA 139 06/05/2014 0125   K 4.1 11/01/2022 1402   K 3.0 (L) 06/05/2014 0125   CL 105 11/01/2022 1402   CL 108 06/05/2014 0125   CO2 24 11/01/2022 1402   CO2 25 06/05/2014 0125   GLUCOSE 97 11/01/2022 1402   GLUCOSE 134 (H) 06/05/2014 0125   BUN 9 11/01/2022 1402   BUN 13 10/15/2021 1533   BUN 17 06/05/2014 0125   CREATININE 0.97 11/01/2022 1402   CREATININE 0.83 06/05/2014 0125   CALCIUM 9.3 11/01/2022 1402   CALCIUM 8.5 (L) 06/05/2014 0125   PROT 7.9 11/01/2022 1402   PROT 7.4 10/15/2021 1533   PROT 7.1 06/05/2014 0125   ALBUMIN 4.2 11/01/2022 1402   ALBUMIN 4.5 10/15/2021 1533   ALBUMIN 3.9 06/05/2014 0125   AST 25 11/01/2022 1402   AST 21 06/05/2014 0125   ALT 18 11/01/2022 1402   ALT 12 (L) 06/05/2014 0125   ALKPHOS 59 11/01/2022 1402   ALKPHOS 60 06/05/2014 0125   BILITOT  1.0 11/01/2022 1402   BILITOT 1.2 10/15/2021 1533   BILITOT 0.9 06/05/2014 0125   GFRNONAA >60 11/01/2022 1402   GFRNONAA >60 06/05/2014 0125   GFRAA >60 09/23/2019 0641   GFRAA >60 06/05/2014 0125   04/27/2020 ECHO:  11/12/2022 labs show: Troponin 5, WBC 6.7, hemoglobin 16.6, platelets 269, normal kidney function, AST/ALT normal, bilirubin total 1.6, APTT 26.6, INR 1.1, PT 12.9, lipase 37  11/12/2022 CT chest abdomen pelvis with contrast Impression:  1. No etiology for epigastric pain identified.  2.  Multiple hypoattenuating lesions are seen in the spleen. These have  been described on prior outside CT reports, recommend obtaining outside images for comparison to assess stability.   11/03/2022 CT abdomen pelvis with IV contrast IMPRESSION:  No acute abdominopelvic abnormality.   10/10/2022 CT abdomen pelvis with IV contrast IMPRESSION:  --No acute intra-abdominal/pelvic pathology.  --Again noted, multiple small ventral hernias containing small bowel without evidence of obstruction. Correlation with physical exam is recommended to exclude acute complication at these sites.  --Indeterminate lesions in the spleen. Further evaluation with nonemergent/outpatient MRI can be obtained as clinically indicated, if not already performed.  --Chronic and incidental findings, as described in the body of the report.   05/11/2022 CT abdomen pelvis with IV contrast IMPRESSION:  Multiple small ventral hernias containing small bowel without evidence of obstruction. Correlation with physical exam is recommended to exclude acute complication at these sites.  Indeterminate lesions in the spleen. Further evaluation with nonemergent/outpatient MRI can be obtained as clinically indicated.  Additional findings, as described above.   Per Dr. Amedeo Kinsman note on 10/12/2019- "Pathology from the 2014 colon resection showed a 6 mm tubular adenoma.    The patient's colonoscopy of September 26, 2014 completed by  Era Skeen, MD was reviewed. He describes a 20 mm polyp at the anastomosis. Partial resection. Multiple sigmoid diverticuli. Pathology of the area describes granulation tissue only. A 3-year follow-up was recommended." Colon, polyp(s), transverse - POLYPOID, INFLAMED GRANULATION TISSUE. - NO ADENOMATOUS CHANGE OR MALIGNANCY IDENTIFIED.   Assessment: 77 year old African-American female patient that presents with complaint of chronic constipation with abdominal discomfort, nausea, and bloating.  We will need to obtain records from her most recent colonoscopy for clarification, if she indeed did not have a colonoscopy we will consider scheduling here at the [facility].  We will give patient samples of pro secretory agent Linzess p.o. daily.  Will consider pelvic floor therapy in the future. 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 I suspect the degree of her issues she may have some motility issues. She will continue her current regimen for GERD as it seems to control her symptoms. Encounter Diagnoses  Name Primary?   Chronic idiopathic constipation Yes   Gastroesophageal reflux disease, unspecified whether esophagitis present    Nausea without vomiting    Plan: -Continue Omeprazole 40mg  po as needed -Pamphlet on GERD diet -Samples of Linzess po daily -Consider pelvic floor therapy in future -Continue stress management  Deanna May, FNP-C  Gastroenterology 02/04/2023, 11:19 AM  The APP and I saw this patient together, with the APP acting as my scribe.  I have reviewed the above documentation for accuracy and completeness and I agree with the above documentation. We will try to get some further records from her recent hospitalization, because I am not certain she had a colonoscopy at that time.  If not, then we will get one scheduled at our office. That said, unlikely  an obstructive problem based on CT findings.  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  She was reassured by that, especially having been dealing with a considerable amount of stress in recent months. We will try some low-dose Linzess in hopes it does not cause adverse effects.  Jefry Lesinski L. Myrtie Neither, MD   Cc: Malva Limes, MD

## 2023-02-04 ENCOUNTER — Ambulatory Visit: Payer: Medicare Other | Admitting: Gastroenterology

## 2023-02-04 VITALS — BP 114/80 | HR 80 | Ht 63.0 in | Wt 180.6 lb

## 2023-02-04 DIAGNOSIS — Z8719 Personal history of other diseases of the digestive system: Secondary | ICD-10-CM

## 2023-02-04 DIAGNOSIS — K5904 Chronic idiopathic constipation: Secondary | ICD-10-CM | POA: Diagnosis not present

## 2023-02-04 DIAGNOSIS — R110 Nausea: Secondary | ICD-10-CM | POA: Diagnosis not present

## 2023-02-04 DIAGNOSIS — R109 Unspecified abdominal pain: Secondary | ICD-10-CM

## 2023-02-04 DIAGNOSIS — K219 Gastro-esophageal reflux disease without esophagitis: Secondary | ICD-10-CM | POA: Diagnosis not present

## 2023-02-04 NOTE — Patient Instructions (Addendum)
We have given you samples of the following medication to take: LINZESS 72  Linzess works best when taken once a day every day, on an empty stomach, at least 30 minutes before your first meal of the day.  When Linzess is taken daily as directed:  *Constipation relief is typically felt in about a week *IBS-C patients may begin to experience relief from belly pain and overall abdominal symptoms (pain, discomfort, and bloating) in about 1 week,   with symptoms typically improving over 12 weeks.  Diarrhea may occur in the first 2 weeks -keep taking it.  The diarrhea should go away and you should start having normal, complete, full bowel movements. It may be helpful to start treatment when you can be near the comfort of your own bathroom, such as a weekend.   _______________________________________________________  If your blood pressure at your visit was 140/90 or greater, please contact your primary care physician to follow up on this.  _______________________________________________________  If you are age 44 or older, your body mass index should be between 23-30. Your Body mass index is 31.99 kg/m. If this is out of the aforementioned range listed, please consider follow up with your Primary Care Provider.  If you are age 3 or younger, your body mass index should be between 19-25. Your Body mass index is 31.99 kg/m. If this is out of the aformentioned range listed, please consider follow up with your Primary Care Provider.   ________________________________________________________  The [place] [facility] providers would like to encourage you to use [facility] to communicate with providers for non-urgent requests or questions.  Due to long hold times on the telephone, sending your provider a message by [facility] may be a faster and more efficient way to get a response.  Please allow 48 business hours for a response.  Please remember that this is for non-urgent requests.   _______________________________________________________ It was a pleasure to see you today!  Thank you for trusting me with your gastrointestinal care!

## 2023-03-14 ENCOUNTER — Encounter: Payer: Self-pay | Admitting: Family Medicine

## 2023-03-14 ENCOUNTER — Ambulatory Visit (INDEPENDENT_AMBULATORY_CARE_PROVIDER_SITE_OTHER): Payer: Medicare Other | Admitting: Family Medicine

## 2023-03-14 VITALS — BP 140/88 | HR 75 | Temp 97.6°F | Resp 16 | Ht 63.0 in | Wt 178.9 lb

## 2023-03-14 DIAGNOSIS — R7303 Prediabetes: Secondary | ICD-10-CM | POA: Diagnosis not present

## 2023-03-14 DIAGNOSIS — I471 Supraventricular tachycardia, unspecified: Secondary | ICD-10-CM | POA: Diagnosis not present

## 2023-03-14 DIAGNOSIS — F39 Unspecified mood [affective] disorder: Secondary | ICD-10-CM

## 2023-03-14 DIAGNOSIS — E785 Hyperlipidemia, unspecified: Secondary | ICD-10-CM

## 2023-03-14 DIAGNOSIS — I10 Essential (primary) hypertension: Secondary | ICD-10-CM | POA: Diagnosis not present

## 2023-03-14 DIAGNOSIS — E2839 Other primary ovarian failure: Secondary | ICD-10-CM

## 2023-03-14 MED ORDER — ESCITALOPRAM OXALATE 10 MG PO TABS: 10.0000 mg | ORAL_TABLET | Freq: Every day | ORAL | 4 refills | Status: DC

## 2023-03-15 LAB — LIPID PANEL
Chol/HDL Ratio: 5.3 {ratio} — ABNORMAL HIGH (ref 0.0–4.4)
Cholesterol, Total: 246 mg/dL — ABNORMAL HIGH (ref 100–199)
HDL: 46 mg/dL (ref >39)
LDL Chol Calc (NIH): 189 mg/dL — ABNORMAL HIGH (ref 0–99)
Triglycerides: 67 mg/dL (ref 0–149)
VLDL Cholesterol Cal: 11 mg/dL (ref 5–40)

## 2023-03-15 LAB — COMPREHENSIVE METABOLIC PANEL
ALT: 26 [IU]/L (ref 0–32)
AST: 18 [IU]/L (ref 0–40)
Albumin: 4.5 g/dL (ref 3.8–4.8)
Alkaline Phosphatase: 69 [IU]/L (ref 44–121)
BUN/Creatinine Ratio: 15 (ref 12–28)
BUN: 13 mg/dL (ref 8–27)
Bilirubin Total: 0.5 mg/dL (ref 0.0–1.2)
CO2: 20 mmol/L (ref 20–29)
Calcium: 9.6 mg/dL (ref 8.7–10.3)
Chloride: 102 mmol/L (ref 96–106)
Creatinine, Ser: 0.89 mg/dL (ref 0.57–1.00)
Globulin, Total: 2.7 g/dL (ref 1.5–4.5)
Glucose: 92 mg/dL (ref 70–99)
Potassium: 4.9 mmol/L (ref 3.5–5.2)
Sodium: 140 mmol/L (ref 134–144)
Total Protein: 7.2 g/dL (ref 6.0–8.5)
eGFR: 68 mL/min/{1.73_m2} (ref >59)

## 2023-03-15 LAB — CBC
Hematocrit: 47.3 % — ABNORMAL HIGH (ref 34.0–46.6)
Hemoglobin: 15.6 g/dL (ref 11.1–15.9)
MCH: 30.4 pg (ref 26.6–33.0)
MCHC: 33 g/dL (ref 31.5–35.7)
MCV: 92 fL (ref 79–97)
Platelets: 231 10*3/uL (ref 150–450)
RBC: 5.14 x10E6/uL (ref 3.77–5.28)
RDW: 13.1 % (ref 11.7–15.4)
WBC: 5 10*3/uL (ref 3.4–10.8)

## 2023-03-15 LAB — HEMOGLOBIN A1C
Est. average glucose Bld gHb Est-mCnc: 117 mg/dL
Hgb A1c MFr Bld: 5.7 % — ABNORMAL HIGH (ref 4.8–5.6)

## 2023-03-16 ENCOUNTER — Other Ambulatory Visit: Payer: Self-pay | Admitting: Family Medicine

## 2023-03-16 DIAGNOSIS — E785 Hyperlipidemia, unspecified: Secondary | ICD-10-CM

## 2023-03-16 MED ORDER — ROSUVASTATIN CALCIUM 20 MG PO TABS: 20.0000 mg | ORAL_TABLET | Freq: Every day | ORAL | 1 refills | Status: DC
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
## 2023-04-02 ENCOUNTER — Other Ambulatory Visit: Payer: Medicare Other

## 2023-04-10 ENCOUNTER — Ambulatory Visit: Payer: Self-pay | Admitting: Family Medicine

## 2023-04-10 NOTE — Telephone Encounter (Signed)
Copied from CRM [id number]. Topic: Clinical - Red Word Triage >> Apr 10, 2023  3:59 PM Ivette P wrote: Red Word that prompted transfer to Nurse Triage: Pt stated she has a severe sinus infection. Congestion, cough   Chief Complaint: Sinus pain Symptoms: Sinus congestion, sinus pain, cough, intermittent fever  Frequency: Constant  Pertinent Negatives: Patient denies difficulty breathing  Disposition: [] ED /[] Urgent Care (no appt availability in office) / [x] Appointment(In office/virtual)/ []  [facility]/ [] Home Care/ [] Refused Recommended Disposition /[] [facility]/ []  Follow-up with PCP Additional Notes: Patient reports a history of sinus infections and believes she has one now. She reports sinus pain and congestion, cough, and intermittent fever that began 2 days ago. She denies any difficulty breathing. Virtual appointment made tomorrow for evaluation and treatment.    Reason for Disposition  [1] Sinus pain (not just congestion) AND [2] fever  Answer Assessment - Initial Assessment Questions 1. LOCATION: "Where does it hurt?"      Pressure over eyes 2. ONSET: "When did the sinus pain start?"  (e.g., hours, days)      2 days 3. SEVERITY: "How bad is the pain?"   (Scale 1-10; mild, moderate or severe)   - MILD (1-3): doesn't interfere with normal activities    - MODERATE (4-7): interferes with normal activities (e.g., work or school) or awakens from sleep   - SEVERE (8-10): excruciating pain and patient unable to do any normal activities        8/10 4. RECURRENT SYMPTOM: "Have you ever had sinus problems before?" If Yes, ask: "When was the last time?" and "What happened that time?"      Yes, history of sinus infections  5. NASAL CONGESTION: "Is the nose blocked?" If Yes, ask: "Can you open it or must you breathe through your mouth?"     Yes 6. NASAL DISCHARGE: "Do you have discharge from your nose?" If so ask, "What color?"     Has not checked  7. FEVER: "Do you  have a fever?" If Yes, ask: "What is it, how was it measured, and when did it start?"      Intermittent mild fever 8. OTHER SYMPTOMS: "Do you have any other symptoms?" (e.g., sore throat, cough, earache, difficulty breathing)     Cough with dark sputum production  9. PREGNANCY: "Is there any chance you are pregnant?" "When was your last menstrual period?"     No  Protocols used: Sinus Pain or Congestion-A-AH

## 2023-04-11 ENCOUNTER — Encounter: Payer: Self-pay | Admitting: Family Medicine

## 2023-04-11 ENCOUNTER — Telehealth (INDEPENDENT_AMBULATORY_CARE_PROVIDER_SITE_OTHER): Payer: Medicare Other | Admitting: Family Medicine

## 2023-04-11 DIAGNOSIS — R051 Acute cough: Secondary | ICD-10-CM | POA: Diagnosis not present

## 2023-04-11 MED ORDER — FLUCONAZOLE 150 MG PO TABS: 150.0000 mg | ORAL_TABLET | Freq: Once | ORAL | 0 refills | Status: AC

## 2023-04-11 MED ORDER — DOXYCYCLINE HYCLATE 100 MG PO TABS: 100.0000 mg | ORAL_TABLET | Freq: Two times a day (BID) | ORAL | 0 refills | Status: AC

## 2023-04-11 MED ORDER — PROMETHAZINE-DM 6.25-15 MG/5ML PO SYRP: 5.0000 mL | ORAL_SOLUTION | Freq: Four times a day (QID) | ORAL | 0 refills | Status: DC | PRN

## 2023-04-11 NOTE — Progress Notes (Signed)
MyChart Video Visit    Virtual Visit via Video Note   This format is felt to be most appropriate for this patient at this time. Physical exam was limited by quality of the video and audio technology used for the visit.   Patient location: Patient's home address   Provider location: [facility]  [address], [address]  [place], [place] [address]  I discussed the limitations of evaluation and management by telemedicine and the availability of in person appointments. The patient expressed understanding and agreed to proceed.  Patient: [name]   DOB: 11/15/1945   78 y.o. Female  MRN: [id number]  Visit Date: 04/11/2023  Today's healthcare provider: Ronnald Ramp, MD   Chief Complaint  Patient presents with   Sinusitis   Subjective    HPI   Discussed the use of AI scribe software for clinical note transcription with the patient, who gave verbal consent to proceed.  History of Present Illness   [name] is a 78 year old female who presents with upper respiratory symptoms including sinus congestion, pain, and cough for two days.  She has been experiencing sinus congestion, pressure across her forehead, and a slight fever for the past two days. There is also drainage and a sore throat. A persistent cough worsens at night but is present during the day as well. She typically experiences sinus infections a couple of times a year and suspects this might be the case again.  In the past, her doctor has prescribed hydrocodone, an antibiotic, and Diflucan to manage her symptoms, as she often develops a yeast infection when taking antibiotics. She is allergic to amoxicillin, levofloxacin, calcium channel blockers, nickel, and penicillin. She recalls that the cough medicine she usually receives contains a small amount of codeine, which she finds effective for her symptoms.  No body pain.          Past Medical History:  Diagnosis Date    Abdominal hernia    Anxiety    Arthritis    lower spine   Back pain    lower back - S/P lifting injury   Closed fracture of lateral malleolus of right fibula 10/11/2020   Complication of anesthesia    makes hair brittle   COVID-19 09/20/2022   GERD (gastroesophageal reflux disease)    Hyperlipidemia    Hypertension    Neuromuscular disorder (HCC)    numbness legs and feet s/p lower back injury   Partial bowel obstruction (HCC) 02/04/2018   Partial small bowel obstruction (HCC) 02/04/2018   SBO (small bowel obstruction) (HCC) 03/13/2017   Stroke (HCC)    "mini - strokes" 2009 - no deficit   Vertigo    none recently   Vitamin D deficiency    Wears contact lenses     Medications: Outpatient Medications Prior to Visit  Medication Sig   albuterol (VENTOLIN HFA) 108 (90 Base) MCG/ACT inhaler TAKE 2 PUFFS BY MOUTH EVERY 6 HOURS AS NEEDED FOR WHEEZE OR SHORTNESS OF BREATH   ALPRAZolam (XANAX) 1 MG tablet Take 0.5-1 tablets (0.5-1 mg total) by mouth 3 (three) times daily as needed for anxiety.   aspirin EC 81 MG tablet Take 81 mg by mouth daily.   clotrimazole (LOTRIMIN) 1 % cream Apply 1 application topically 2 (two) times daily.   clotrimazole (MYCELEX) 10 MG troche Take 1 tablet (10 mg total) by mouth 5 (five) times daily.   dicyclomine (BENTYL) 10 MG capsule Take 1 capsule (10 mg  total) by mouth 3 (three) times daily before meals. As needed for stomach pains   escitalopram (LEXAPRO) 10 MG tablet Take 1 tablet (10 mg total) by mouth daily.   fluticasone (FLONASE) 50 MCG/ACT nasal spray PLACE 1-2 SPRAYS INTO BOTH NOSTRILS DAILY AS NEEDED FOR ALLERGIES OR RHINITIS.   ibuprofen (ADVIL) 800 MG tablet Take 1 tablet (800 mg total) by mouth daily as needed (severe pain).   lactulose (CHRONULAC) 10 GM/15ML solution Take 45 mLs (30 g total) by mouth daily as needed for mild constipation.   omeprazole (PRILOSEC) 40 MG capsule Take 1 capsule (40 mg total) by mouth daily.   ondansetron  (ZOFRAN-ODT) 4 MG disintegrating tablet Take 1 tablet (4 mg total) by mouth every 8 (eight) hours as needed for nausea or vomiting.   oxyCODONE (ROXICODONE) 5 MG immediate release tablet Take 1 tablet (5 mg total) by mouth every 4 (four) hours as needed for severe pain.   promethazine (PHENERGAN) 12.5 MG tablet TAKE 1 TO 2 TABLETS BY MOUTH EVERY 8 HOURS AS NEEDED FOR NAUSEA FOR VOMITING   rosuvastatin (CRESTOR) 20 MG tablet Take 1 tablet (20 mg total) by mouth daily.   Spacer/Aero-Holding Chambers (EASIVENT) inhaler Use as needed with inhaler   Spacer/Aero-Holding Chambers (EQ SPACE CHAMBER ANTI-STATIC) DEVI Inhale into the lungs as needed.   verapamil (CALAN-SR) 120 MG CR tablet Take 1 tablet (120 mg total) by mouth at bedtime.   No facility-administered medications prior to visit.    Review of Systems      Objective    There were no vitals taken for this visit.      Physical Exam Constitutional:      General: She is not in acute distress.    Appearance: Normal appearance. She is ill-appearing.  Pulmonary:     Effort: Pulmonary effort is normal. No respiratory distress.  Neurological:     Mental Status: She is alert and oriented to person, place, and time.        Assessment & Plan     Problem List Items Addressed This Visit   None Visit Diagnoses       Acute cough    -  Primary       Assessment and Plan    Acute Sinusitis 78 year old female with sinus congestion, pain, cough, and slight fever for two days. Symptoms align with recurrent sinus infections. Allergic to amoxicillin, levofloxacin, calcium channel blockers, nickel, and penicillin. Given her history and current symptoms, acute sinusitis is likely. Discussed viral vs. bacterial etiology and the rationale for early doxycycline treatment. Explained doxycycline's efficacy and safety given her allergies. Discussed promethazine with dextromethorphan for nocturnal cough relief and Diflucan to prevent  antibiotic-induced yeast infections. - Prescribe doxycycline 100 mg BID for 7 days - Prescribe promethazine with dextromethorphan cough syrup, up to 4 times daily as needed - Prescribe Diflucan 150 mg tablet, one after antibiotics and another three days later if symptoms persist  Cough Persistent nocturnal cough likely secondary to acute sinusitis. Promethazine with dextromethorphan, though lacking codeine, should provide symptomatic relief. - Prescribe promethazine with dextromethorphan cough syrup, up to 4 times daily as needed  Follow-up - Follow up with Dr. Sherrie Mustache if symptoms do not improve.         No follow-ups on file.     I discussed the assessment and treatment plan with the patient. The patient was provided an opportunity to ask questions and all were answered. The patient agreed with the plan and demonstrated an understanding  of the instructions.   The patient was advised to call back or seek an in-person evaluation if the symptoms worsen or if the condition fails to improve as anticipated.  I provided 10 minutes of non-face-to-face time during this encounter.   Ronnald Ramp, MD [facility] [phone] (phone) [phone] (fax)  [facility]

## 2023-04-16 ENCOUNTER — Telehealth: Payer: Self-pay

## 2023-04-16 MED ORDER — PROMETHAZINE-DM 6.25-15 MG/5ML PO SYRP: 5.0000 mL | ORAL_SOLUTION | Freq: Four times a day (QID) | ORAL | 0 refills | Status: DC | PRN

## 2023-04-16 MED ORDER — FLUCONAZOLE 150 MG PO TABS: 150.0000 mg | ORAL_TABLET | Freq: Once | ORAL | 0 refills | Status: AC

## 2023-04-16 NOTE — Telephone Encounter (Signed)
 Refills sent

## 2023-04-16 NOTE — Telephone Encounter (Signed)
 Copied from CRM [id number]. Topic: Clinical - Medication Question >> Apr 16, 2023  9:52 AM Antwanette L wrote: Reason for CRM: Patient was prescribed promethazine-dextromethorphan (PROMETHAZINE-DM) 6.25-15 MG/5ML syrup by Dr. Tawanna Cooler Simmons-Robinson on 04/11/23. Patient has zero refills on the medicine and wants to know if Dr.Makiera would refill the medicine along with Diflucan. Patient can be contacted by phone at [phone]. Listed below is the patient preferred pharmacy.  [facility] [address] [place], [place] [address] Phone: [phone]  Fax: [phone]

## 2023-05-13 ENCOUNTER — Ambulatory Visit (INDEPENDENT_AMBULATORY_CARE_PROVIDER_SITE_OTHER): Admitting: Family Medicine

## 2023-05-13 ENCOUNTER — Other Ambulatory Visit: Payer: Self-pay | Admitting: Family Medicine

## 2023-05-13 ENCOUNTER — Ambulatory Visit: Payer: Self-pay

## 2023-05-13 VITALS — BP 140/85 | HR 71 | Temp 98.0°F | Ht 63.0 in | Wt 180.0 lb

## 2023-05-13 DIAGNOSIS — E7800 Pure hypercholesterolemia, unspecified: Secondary | ICD-10-CM

## 2023-05-13 DIAGNOSIS — R399 Unspecified symptoms and signs involving the genitourinary system: Secondary | ICD-10-CM | POA: Diagnosis not present

## 2023-05-13 DIAGNOSIS — N3001 Acute cystitis with hematuria: Secondary | ICD-10-CM | POA: Diagnosis not present

## 2023-05-13 DIAGNOSIS — R110 Nausea: Secondary | ICD-10-CM | POA: Diagnosis not present

## 2023-05-13 LAB — POCT URINALYSIS DIPSTICK
Bilirubin, UA: NEGATIVE
Glucose, UA: NEGATIVE
Ketones, UA: NEGATIVE
Leukocytes, UA: NEGATIVE
Nitrite, UA: NEGATIVE
Protein, UA: NEGATIVE
Spec Grav, UA: 1.015 (ref 1.010–1.025)
Urobilinogen, UA: 0.2 U/dL
pH, UA: 6.5 (ref 5.0–8.0)

## 2023-05-13 MED ORDER — FLUCONAZOLE 150 MG PO TABS: 150.0000 mg | ORAL_TABLET | Freq: Once | ORAL | 0 refills | Status: AC

## 2023-05-13 MED ORDER — CEPHALEXIN 500 MG PO CAPS
500.0000 mg | ORAL_CAPSULE | Freq: Two times a day (BID) | ORAL | 0 refills | Status: AC
Start: 2023-05-13 — End: 2023-05-16

## 2023-05-13 MED ORDER — ONDANSETRON 4 MG PO TBDP
4.0000 mg | ORAL_TABLET | Freq: Three times a day (TID) | ORAL | 0 refills | Status: DC | PRN
Start: 2023-05-13 — End: 2023-05-28

## 2023-05-13 NOTE — Telephone Encounter (Signed)
 Pt seen in office today, states they started her on abx for UTI. Pt states she is concerned that she always gets diflucan for yeast infections whenever she is given an abx. Pt states that she would like PCP to send in diflucan rx at this time for her.  Reason for Disposition . [1] Caller has NON-URGENT medicine question about med that PCP prescribed AND [2] triager unable to answer question  Answer Assessment - Initial Assessment Questions 1. NAME of MEDICINE: "What medicine(s) are you calling about?"     diflucan 2. QUESTION: "What is your question?" (e.g., double dose of medicine, side effect)     Started abx today, states that she always gets diflucan with her abx, requesting PCP send in diflucan  Protocols used: Medication Question Call-A-AH

## 2023-05-13 NOTE — Telephone Encounter (Signed)
Please see the message below and advise.

## 2023-05-13 NOTE — Progress Notes (Signed)
 Established patient visit   Patient: [name]   DOB: [birth year]   78 y.o. Female  MRN: [id number] Visit Date: 05/13/2023  Today's healthcare provider: Sherlyn Hay, DO   Chief Complaint  Patient presents with   Urinary Tract Infection    Patient reports increase in frequency.  She denies any dysuria, blood, burning, back or side pain.  She does relate having nausea but states that has been going on for over 1 month.   Subjective    HPI [name] is a 78 year old female who presents with urinary frequency.  She has been experiencing increased urinary frequency, which is different from her usual pattern. The frequency varies with her diet, particularly when consuming a lot of fruit, such as watermelon. However, the current frequency is more pronounced than usual. No pain, burning, or urgency is associated with urination, and there has been no leakage. She recalls having a urinary tract infection many years ago but has not used medication for urinary frequency outside of that context.  Nausea has been present for the past couple of weeks, which she attributes to her colon issues. She has been using ondansetron (Zofran) for nausea management. An increase in her Xanax dosage from 0.5 mg to 1 mg has been associated with nausea, even when she breaks the tablet in half. She takes Xanax daily, sometimes twice or three times a day, often without food due to decreased appetite when anxious.  She mentions occasional headaches, which she suspects might be related to pollen exposure. No back pain or sharp pains are reported.  She has a known allergy to Levaquin and all penicillins but has tolerated Keflex (cefalexin) in the past without issues.      Medications: Outpatient Medications Prior to Visit  Medication Sig   albuterol (VENTOLIN HFA) 108 (90 Base) MCG/ACT inhaler TAKE 2 PUFFS BY MOUTH EVERY 6 HOURS AS NEEDED FOR WHEEZE OR SHORTNESS OF BREATH   ALPRAZolam (XANAX) 1 MG  tablet Take 0.5-1 tablets (0.5-1 mg total) by mouth 3 (three) times daily as needed for anxiety.   aspirin EC 81 MG tablet Take 81 mg by mouth daily.   clotrimazole (LOTRIMIN) 1 % cream Apply 1 application topically 2 (two) times daily.   clotrimazole (MYCELEX) 10 MG troche Take 1 tablet (10 mg total) by mouth 5 (five) times daily.   dicyclomine (BENTYL) 10 MG capsule Take 1 capsule (10 mg total) by mouth 3 (three) times daily before meals. As needed for stomach pains   escitalopram (LEXAPRO) 10 MG tablet Take 1 tablet (10 mg total) by mouth daily.   fluticasone (FLONASE) 50 MCG/ACT nasal spray PLACE 1-2 SPRAYS INTO BOTH NOSTRILS DAILY AS NEEDED FOR ALLERGIES OR RHINITIS.   ibuprofen (ADVIL) 800 MG tablet Take 1 tablet (800 mg total) by mouth daily as needed (severe pain).   lactulose (CHRONULAC) 10 GM/15ML solution Take 45 mLs (30 g total) by mouth daily as needed for mild constipation.   omeprazole (PRILOSEC) 40 MG capsule Take 1 capsule (40 mg total) by mouth daily.   oxyCODONE (ROXICODONE) 5 MG immediate release tablet Take 1 tablet (5 mg total) by mouth every 4 (four) hours as needed for severe pain.   promethazine (PHENERGAN) 12.5 MG tablet TAKE 1 TO 2 TABLETS BY MOUTH EVERY 8 HOURS AS NEEDED FOR NAUSEA FOR VOMITING   promethazine-dextromethorphan (PROMETHAZINE-DM) 6.25-15 MG/5ML syrup Take 5 mLs by mouth 4 (four) times daily as needed for cough.  rosuvastatin (CRESTOR) 20 MG tablet Take 1 tablet (20 mg total) by mouth daily.   Spacer/Aero-Holding Chambers (EASIVENT) inhaler Use as needed with inhaler   Spacer/Aero-Holding Chambers (EQ SPACE CHAMBER ANTI-STATIC) DEVI Inhale into the lungs as needed.   verapamil (CALAN-SR) 120 MG CR tablet Take 1 tablet (120 mg total) by mouth at bedtime.   [DISCONTINUED] ondansetron (ZOFRAN-ODT) 4 MG disintegrating tablet Take 1 tablet (4 mg total) by mouth every 8 (eight) hours as needed for nausea or vomiting.   No facility-administered medications prior  to visit.        Objective    BP (!) 140/85 (BP Location: Left Arm, Patient Position: Sitting, Cuff Size: Normal)   Pulse 71   Temp 98 F (36.7 C) (Oral)   Ht 5\' 3"  (1.6 m)   Wt 180 lb (81.6 kg)   SpO2 98%   BMI 31.89 kg/m     Physical Exam Vitals and nursing note reviewed.  Constitutional:      General: She is not in acute distress.    Appearance: Normal appearance.  HENT:     Head: Normocephalic and atraumatic.  Eyes:     General: No scleral icterus.    Conjunctiva/sclera: Conjunctivae normal.  Cardiovascular:     Rate and Rhythm: Normal rate.  Pulmonary:     Effort: Pulmonary effort is normal.  Neurological:     Mental Status: She is alert and oriented to person, place, and time. Mental status is at baseline.  Psychiatric:        Mood and Affect: Mood normal.        Behavior: Behavior normal.      Results for orders placed or performed in visit on 05/13/23  Urine Culture   Specimen: Urine   Urine  Result Value Ref Range   Urine Culture, Routine Final report    Organism ID, Bacteria Comment   POCT urinalysis dipstick  Result Value Ref Range   Color, UA yellow    Clarity, UA slightly cloudy    Glucose, UA Negative Negative   Bilirubin, UA neg    Ketones, UA neg    Spec Grav, UA 1.015 1.010 - 1.025   Blood, UA 1+    pH, UA 6.5 5.0 - 8.0   Protein, UA Negative Negative   Urobilinogen, UA 0.2 0.2 or 1.0 E.U./dL   Nitrite, UA neg    Leukocytes, UA Negative Negative   Appearance     Odor      Assessment & Plan    Urinary tract infection symptoms -     POCT urinalysis dipstick -     Urine Culture  Nausea -     Ondansetron; Take 1 tablet (4 mg total) by mouth every 8 (eight) hours as needed for nausea or vomiting.  Dispense: 20 tablet; Refill: 0  Acute cystitis with hematuria -     Cephalexin; Take 1 capsule (500 mg total) by mouth 2 (two) times daily for 3 days.  Dispense: 6 capsule; Refill: 0    Urinary Tract Infection symptoms; acute  cystitis with hematuria Acute increase in urinary frequency suggests UTI.  POC UA negative for leukocytes and nitrites but positive for RBCs; however patient has recently significantly increased her fluids (after experiencing the initial increased frequency) and has been drinking cranberry extract, which may have impacted this result. - Send urine for culture. - Prescribe Keflex for 3 days for presumed infection.  Nausea Intermittent nausea likely related to increased Xanax dose. Advised taking Xanax  with food. Zofran prescribed for symptom management. - Prescribe Zofran (ondansetron) dissolvable tablet. - Advise taking Xanax with food.    Return if symptoms worsen or fail to improve.      I discussed the assessment and treatment plan with the patient  The patient was provided an opportunity to ask questions and all were answered. The patient agreed with the plan and demonstrated an understanding of the instructions.   The patient was advised to call back or seek an in-person evaluation if the symptoms worsen or if the condition fails to improve as anticipated.    Sherlyn Hay, DO  [facility] [phone] (phone) [phone] (fax)  [facility]

## 2023-05-15 LAB — URINE CULTURE

## 2023-05-16 ENCOUNTER — Encounter: Payer: Self-pay | Admitting: Family Medicine

## 2023-05-26 ENCOUNTER — Encounter: Payer: Self-pay | Admitting: Family Medicine

## 2023-05-28 ENCOUNTER — Other Ambulatory Visit: Payer: Self-pay | Admitting: Family Medicine

## 2023-05-28 DIAGNOSIS — R110 Nausea: Secondary | ICD-10-CM

## 2023-06-20 ENCOUNTER — Ambulatory Visit: Payer: Medicare Other | Admitting: Family Medicine

## 2023-06-23 ENCOUNTER — Ambulatory Visit (INDEPENDENT_AMBULATORY_CARE_PROVIDER_SITE_OTHER): Admitting: Family Medicine

## 2023-06-23 VITALS — BP 133/84 | HR 77 | Resp 16 | Wt 177.0 lb

## 2023-06-23 DIAGNOSIS — F329 Major depressive disorder, single episode, unspecified: Secondary | ICD-10-CM

## 2023-06-23 DIAGNOSIS — F410 Panic disorder [episodic paroxysmal anxiety] without agoraphobia: Secondary | ICD-10-CM

## 2023-06-23 DIAGNOSIS — M5442 Lumbago with sciatica, left side: Secondary | ICD-10-CM

## 2023-06-23 DIAGNOSIS — G8929 Other chronic pain: Secondary | ICD-10-CM | POA: Diagnosis not present

## 2023-06-23 DIAGNOSIS — E785 Hyperlipidemia, unspecified: Secondary | ICD-10-CM

## 2023-06-23 MED ORDER — IBUPROFEN 800 MG PO TABS: 800.0000 mg | ORAL_TABLET | Freq: Every day | ORAL | 1 refills | Status: DC | PRN

## 2023-06-23 NOTE — Patient Instructions (Signed)
 Please review the attached list of medications and notify my office if there are any errors.   Start taking the escitalopram  (Lexapro ) every single day.

## 2023-06-23 NOTE — Progress Notes (Signed)
 Established patient visit   Patient: [name]   DOB: 1945/06/01   78 y.o. Female  MRN: [id number] Visit Date: 06/23/2023  Today's healthcare provider: Jeralene Mom, MD   Chief Complaint  Patient presents with   Medical Management of Chronic Issues   Panic Attack    Happening more frequent for the past few weeks.   Subjective    Discussed the use of AI scribe software for clinical note transcription with the patient, who gave verbal consent to proceed.  History of Present Illness   [name] is a 78 year old female who presents for a follow-up on cholesterol medication and management of panic attacks.  She has been taking rosuvastatin  for cholesterol management since January without any reported side effects or issues.   She experiences worsening panic attacks, which she attributes to life circumstances. She uses alprazolam  as needed, taking it every day, sometimes up to three times a day, depending on the severity of her symptoms. She has tried breathing exercises, which she found helpful, but often feels too depressed to engage in them regularly. She is also prescribed escitalopram  10 mg, which she takes two to three times a week rather than daily, due to concerns about medication dependency.  She uses ibuprofen  for back pain when it becomes extreme, but does not take it frequently.     Lab Results  Component Value Date   CHOL 246 (H) 03/14/2023   HDL 46 03/14/2023   LDLCALC 189 (H) 03/14/2023   LDLDIRECT 217 (H) 04/29/2017   TRIG 67 03/14/2023   CHOLHDL 5.3 (H) 03/14/2023     Medications: Outpatient Medications Prior to Visit  Medication Sig   albuterol  (VENTOLIN  HFA) 108 (90 Base) MCG/ACT inhaler TAKE 2 PUFFS BY MOUTH EVERY 6 HOURS AS NEEDED FOR WHEEZE OR SHORTNESS OF BREATH   ALPRAZolam  (XANAX ) 1 MG tablet Take 0.5-1 tablets (0.5-1 mg total) by mouth 3 (three) times daily as needed for anxiety.   aspirin  EC 81 MG tablet Take 81 mg by mouth daily.    clotrimazole  (LOTRIMIN ) 1 % cream Apply 1 application topically 2 (two) times daily.   clotrimazole  (MYCELEX ) 10 MG troche Take 1 tablet (10 mg total) by mouth 5 (five) times daily.   dicyclomine  (BENTYL ) 10 MG capsule Take 1 capsule (10 mg total) by mouth 3 (three) times daily before meals. As needed for stomach pains   escitalopram  (LEXAPRO ) 10 MG tablet Take 1 tablet (10 mg total) by mouth daily. (ONLY TAKING 2-3 DAYS A WEEK)   fluticasone  (FLONASE ) 50 MCG/ACT nasal spray PLACE 1-2 SPRAYS INTO BOTH NOSTRILS DAILY AS NEEDED FOR ALLERGIES OR RHINITIS.   lactulose  (CHRONULAC ) 10 GM/15ML solution Take 45 mLs (30 g total) by mouth daily as needed for mild constipation.   omeprazole  (PRILOSEC) 40 MG capsule Take 1 capsule (40 mg total) by mouth daily.   ondansetron  (ZOFRAN -ODT) 4 MG disintegrating tablet DISSOLVE 1 TABLET IN MOUTH EVERY 8 HOURS AS NEEDED FOR NAUSEA OR VOMITING   oxyCODONE  (ROXICODONE ) 5 MG immediate release tablet Take 1 tablet (5 mg total) by mouth every 4 (four) hours as needed for severe pain.   promethazine  (PHENERGAN ) 12.5 MG tablet TAKE 1 TO 2 TABLETS BY MOUTH EVERY 8 HOURS AS NEEDED FOR NAUSEA FOR VOMITING   rosuvastatin  (CRESTOR ) 20 MG tablet Take 1 tablet (20 mg total) by mouth daily.   Spacer/Aero-Holding Chambers (EASIVENT) inhaler Use as needed with inhaler   Spacer/Aero-Holding Chambers (EQ SPACE CHAMBER  ANTI-STATIC) DEVI Inhale into the lungs as needed.   verapamil  (CALAN -SR) 120 MG CR tablet Take 1 tablet (120 mg total) by mouth at bedtime.   ibuprofen  (ADVIL ) 800 MG tablet Take 1 tablet (800 mg total) by mouth daily as needed (severe pain).   No facility-administered medications prior to visit.   Review of Systems     Objective    BP 133/84 (BP Location: Left Arm, Patient Position: Sitting, Cuff Size: Normal)   Pulse 77   Resp 16   Wt 177 lb (80.3 kg)   SpO2 98%   BMI 31.35 kg/m   Physical Exam   General appearance: Overweight female, cooperative and in  no acute distress Head: Normocephalic, without obvious abnormality, atraumatic Respiratory: Respirations even and unlabored, normal respiratory rate Extremities: All extremities are intact.  Skin: Skin color, texture, turgor normal. No rashes seen  Psych: Appropriate mood and affect. Neurologic: Mental status: Alert, oriented to person, place, and time, thought content appropriate.    Assessment & Plan     1. Hyperlipidemia, unspecified hyperlipidemia type (Primary) Has been taking rosuvastatin  consistently since January with no apparent side effects.   - Comprehensive metabolic panel with GFR - Lipid panel  2. Major depressive disorder with single episode, remission status unspecified Is only taking escitalopram  due to her concern about addicted to multiple medications. Advised that alprazolam  is more habit forming and that taking escitalopram  consistently every day should reduced her need to take alprazolam  so often.   3. Panic attacks Advised to start taking escitalopram  consistently every day instead of just a few times a week as she has been doing.   4. Chronic bilateral low back pain with left-sided sciatica refill ibuprofen  (ADVIL ) 800 MG tablet; Take 1 tablet (800 mg total) by mouth daily as needed (severe pain).  Dispense: 30 tablet; Refill: 1      Jeralene Mom, MD  [facility] [phone] (phone) [phone] (fax)  [facility]

## 2023-06-24 ENCOUNTER — Ambulatory Visit (INDEPENDENT_AMBULATORY_CARE_PROVIDER_SITE_OTHER): Payer: Medicare Other

## 2023-06-24 ENCOUNTER — Other Ambulatory Visit: Payer: Self-pay | Admitting: Family Medicine

## 2023-06-24 DIAGNOSIS — E785 Hyperlipidemia, unspecified: Secondary | ICD-10-CM

## 2023-06-24 DIAGNOSIS — Z0000 Encounter for general adult medical examination without abnormal findings: Secondary | ICD-10-CM

## 2023-06-24 LAB — LIPID PANEL
Chol/HDL Ratio: 5.2 ratio — ABNORMAL HIGH (ref 0.0–4.4)
Cholesterol, Total: 240 mg/dL — ABNORMAL HIGH (ref 100–199)
HDL: 46 mg/dL (ref >39)
LDL Chol Calc (NIH): 179 mg/dL — ABNORMAL HIGH (ref 0–99)
Triglycerides: 84 mg/dL (ref 0–149)
VLDL Cholesterol Cal: 15 mg/dL (ref 5–40)

## 2023-06-24 LAB — COMPREHENSIVE METABOLIC PANEL WITH GFR
ALT: 14 IU/L (ref 0–32)
AST: 16 IU/L (ref 0–40)
Albumin: 4.3 g/dL (ref 3.8–4.8)
Alkaline Phosphatase: 72 IU/L (ref 44–121)
BUN/Creatinine Ratio: 13 (ref 12–28)
BUN: 12 mg/dL (ref 8–27)
Bilirubin Total: 0.8 mg/dL (ref 0.0–1.2)
CO2: 21 mmol/L (ref 20–29)
Calcium: 9.5 mg/dL (ref 8.7–10.3)
Chloride: 104 mmol/L (ref 96–106)
Creatinine, Ser: 0.89 mg/dL (ref 0.57–1.00)
Globulin, Total: 2.7 g/dL (ref 1.5–4.5)
Glucose: 105 mg/dL — ABNORMAL HIGH (ref 70–99)
Potassium: 5 mmol/L (ref 3.5–5.2)
Sodium: 138 mmol/L (ref 134–144)
Total Protein: 7 g/dL (ref 6.0–8.5)
eGFR: 68 mL/min/{1.73_m2} (ref >59)

## 2023-06-24 MED ORDER — EZETIMIBE 10 MG PO TABS: 10.0000 mg | ORAL_TABLET | Freq: Every day | ORAL | 2 refills | Status: AC

## 2023-06-24 MED ORDER — ROSUVASTATIN CALCIUM 20 MG PO TABS: 20.0000 mg | ORAL_TABLET | Freq: Every day | ORAL | 1 refills | Status: DC

## 2023-06-24 NOTE — Progress Notes (Signed)
 Subjective:   [name] is a 78 y.o. who presents for a Medicare Wellness preventive visit.  Visit Complete: Virtual I connected with  [name] on 06/24/23 by a audio enabled telemedicine application and verified that I am speaking with the correct person using two identifiers.  Patient Location: Home  Provider Location: Office/Clinic  I discussed the limitations of evaluation and management by telemedicine. The patient expressed understanding and agreed to proceed.  Vital Signs: Because this visit was a virtual/telehealth visit, some criteria may be missing or patient reported. Any vitals not documented were not able to be obtained and vitals that have been documented are patient reported.  VideoDeclined- This patient declined Librarian, academic. Therefore the visit was completed with audio only.  Persons Participating in Visit: Patient.  AWV Questionnaire: No: Patient Medicare AWV questionnaire was not completed prior to this visit.  Cardiac Risk Factors include: advanced age (>38men, >61 women);hypertension;dyslipidemia;sedentary lifestyle;obesity (BMI >30kg/m2)     Objective:    There were no vitals filed for this visit. There is no height or weight on file to calculate BMI.     06/24/2023    1:19 PM 06/19/2022    1:44 PM 06/14/2021    2:00 PM 08/23/2020    3:31 PM 04/02/2020    2:56 AM 11/08/2019    4:02 PM 10/29/2019   12:10 PM  Advanced Directives  Does Patient Have a Medical Advance Directive? No Yes No No No Yes Yes  Type of Furniture conservator/restorer;Living will    Living will Living will  Does patient want to make changes to medical advance directive?      No - Patient declined   Would patient like information on creating a medical advance directive? No - Patient declined  No - Patient declined No - Patient declined No - Patient declined      Current Medications (verified) Outpatient Encounter Medications as  of 06/24/2023  Medication Sig   albuterol  (VENTOLIN  HFA) 108 (90 Base) MCG/ACT inhaler TAKE 2 PUFFS BY MOUTH EVERY 6 HOURS AS NEEDED FOR WHEEZE OR SHORTNESS OF BREATH   ALPRAZolam  (XANAX ) 1 MG tablet Take 0.5-1 tablets (0.5-1 mg total) by mouth 3 (three) times daily as needed for anxiety.   aspirin  EC 81 MG tablet Take 81 mg by mouth daily.   clotrimazole  (LOTRIMIN ) 1 % cream Apply 1 application topically 2 (two) times daily.   clotrimazole  (MYCELEX ) 10 MG troche Take 1 tablet (10 mg total) by mouth 5 (five) times daily.   dicyclomine  (BENTYL ) 10 MG capsule Take 1 capsule (10 mg total) by mouth 3 (three) times daily before meals. As needed for stomach pains   escitalopram  (LEXAPRO ) 10 MG tablet Take 1 tablet (10 mg total) by mouth daily.   ezetimibe (ZETIA) 10 MG tablet Take 1 tablet (10 mg total) by mouth daily.   fluticasone  (FLONASE ) 50 MCG/ACT nasal spray PLACE 1-2 SPRAYS INTO BOTH NOSTRILS DAILY AS NEEDED FOR ALLERGIES OR RHINITIS.   ibuprofen  (ADVIL ) 800 MG tablet Take 1 tablet (800 mg total) by mouth daily as needed (severe pain).   omeprazole  (PRILOSEC) 40 MG capsule Take 1 capsule (40 mg total) by mouth daily.   ondansetron  (ZOFRAN -ODT) 4 MG disintegrating tablet DISSOLVE 1 TABLET IN MOUTH EVERY 8 HOURS AS NEEDED FOR NAUSEA OR VOMITING   promethazine  (PHENERGAN ) 12.5 MG tablet TAKE 1 TO 2 TABLETS BY MOUTH EVERY 8 HOURS AS NEEDED FOR NAUSEA FOR VOMITING  rosuvastatin  (CRESTOR ) 20 MG tablet Take 1 tablet (20 mg total) by mouth daily.   Spacer/Aero-Holding Chambers (EASIVENT) inhaler Use as needed with inhaler   Spacer/Aero-Holding Chambers (EQ SPACE CHAMBER ANTI-STATIC) DEVI Inhale into the lungs as needed.   verapamil  (CALAN -SR) 120 MG CR tablet Take 1 tablet (120 mg total) by mouth at bedtime.   lactulose  (CHRONULAC ) 10 GM/15ML solution Take 45 mLs (30 g total) by mouth daily as needed for mild constipation. (Patient not taking: Reported on 06/24/2023)   oxyCODONE  (ROXICODONE ) 5 MG  immediate release tablet Take 1 tablet (5 mg total) by mouth every 4 (four) hours as needed for severe pain. (Patient not taking: Reported on 06/24/2023)   No facility-administered encounter medications on file as of 06/24/2023.    Allergies (verified) Levofloxacin , Calcium  channel blockers, Amoxicillin , Nickel, and Penicillins   History: Past Medical History:  Diagnosis Date   Abdominal hernia    Anxiety    Arthritis    lower spine   Back pain    lower back - S/P lifting injury   Closed fracture of lateral malleolus of right fibula 10/11/2020   Complication of anesthesia    makes hair brittle   COVID-19 09/20/2022   GERD (gastroesophageal reflux disease)    Hyperlipidemia    Hypertension    Neuromuscular disorder (HCC)    numbness legs and feet s/p lower back injury   Partial bowel obstruction (HCC) 02/04/2018   Partial small bowel obstruction (HCC) 02/04/2018   SBO (small bowel obstruction) (HCC) 03/13/2017   Stroke (HCC)    "mini - strokes" 2009 - no deficit   Vertigo    none recently   Vitamin D  deficiency    Wears contact lenses    Past Surgical History:  Procedure Laterality Date   ABDOMINAL HYSTERECTOMY  1987   CARDIAC CATHETERIZATION  02/2007   COLON SURGERY  03/12/2012   Dr Willistine Hartigan   COLONOSCOPY WITH PROPOFOL  N/A 09/26/2014   Procedure: COLONOSCOPY WITH PROPOFOL ;  Surgeon: Marnee Sink, MD;  Location[facility];  Service: Endoscopy;  Laterality: N/A;  marker (tattoo) used in colon   COLONOSCOPY WITH PROPOFOL  N/A 10/27/2019   Procedure: COLONOSCOPY WITH PROPOFOL ;  Surgeon: Marshall Skeeter, MD;  Location: [facility];  Service: Endoscopy;  Laterality: N/A;   ESOPHAGEAL DILATION N/A 02/02/2016   Procedure: ESOPHAGEAL DILATION;  Surgeon: Marnee Sink, MD;  Location: [facility];  Service: Endoscopy;  Laterality: N/A;   ESOPHAGOGASTRODUODENOSCOPY (EGD) WITH PROPOFOL  N/A 02/02/2016   Procedure: ESOPHAGOGASTRODUODENOSCOPY (EGD) WITH PROPOFOL ;  Surgeon:  Marnee Sink, MD;  Location: [facility];  Service: Endoscopy;  Laterality: N/A;   LAPAROSCOPIC ABDOMINAL EXPLORATION N/A 11/08/2019   Procedure: LAPAROSCOPIC ABDOMINAL EXPLORATION;  Surgeon: Marshall Skeeter, MD;  Location: [facility];  Service: General;  Laterality: N/A;  POSSIBLE LAPAROTOMY   POLYPECTOMY  09/26/2014   Procedure: POLYPECTOMY;  Surgeon: Marnee Sink, MD;  Location: [facility];  Service: Endoscopy;;   TUBAL LIGATION  1972   VENTRAL HERNIA REPAIR N/A 11/08/2019   Procedure: HERNIA REPAIR VENTRAL ADULT;  Surgeon: Marshall Skeeter, MD;  Location: [facility];  Service: General;  Laterality: N/A;  possible ventral hernia repair   Family History  Problem Relation Age of Onset   Diabetes Mother    Hypertension Mother    Mental illness Mother    Heart failure Mother 77   Cancer Father        lung cancer   Drug abuse Brother    Cancer Brother  Multiple sclerosis Brother    Social History   Socioeconomic History   Marital status: Married    Spouse name: Not on file   Number of children: 3   Years of education: Not on file   Highest education level: 12th grade  Occupational History   Occupation: retired  Tobacco Use   Smoking status: Never   Smokeless tobacco: Never  Vaping Use   Vaping status: Never Used  Substance and Sexual Activity   Alcohol use: No    Alcohol/week: 0.0 standard drinks of alcohol   Drug use: No   Sexual activity: Not Currently  Other Topics Concern   Not on file  Social History Narrative   Not on file   Social Drivers of Health   Financial Resource Strain: Low Risk  (06/24/2023)   Overall Financial Resource Strain (CARDIA)    Difficulty of Paying Living Expenses: Not very hard  Food Insecurity: No Food Insecurity (06/24/2023)   Hunger Vital Sign    Worried About Running Out of Food in the Last Year: Never true    Ran Out of Food in the Last Year: Never true  Transportation Needs: No Transportation Needs (06/24/2023)   PRAPARE -  Administrator, Civil Service (Medical): No    Lack of Transportation (Non-Medical): No  Physical Activity: Inactive (06/24/2023)   Exercise Vital Sign    Days of Exercise per Week: 0 days    Minutes of Exercise per Session: 0 min  Stress: No Stress Concern Present (06/24/2023)   [employer] of Occupational Health - Occupational Stress Questionnaire    Feeling of Stress : Only a little  Social Connections: Moderately Integrated (06/24/2023)   Social Connection and Isolation Panel [NHANES]    Frequency of Communication with Friends and Family: More than three times a week    Frequency of Social Gatherings with Friends and Family: More than three times a week    Attends Religious Services: 1 to 4 times per year    Active Member of [employer] or Organizations: No    Attends Engineer, structural: Never    Marital Status: Married    Tobacco Counseling Counseling given: Not Answered    Clinical Intake:  Pre-visit preparation completed: Yes  Pain : No/denies pain     BMI - recorded: 31.9 Nutritional Status: BMI > 30  Obese Nutritional Risks: None Diabetes: No  Lab Results  Component Value Date   HGBA1C 5.7 (H) 03/14/2023   HGBA1C 5.9 (H) 10/15/2021   HGBA1C 5.8 (A) 07/03/2021     How often do you need to have someone help you when you read instructions, pamphlets, or other written materials from your doctor or pharmacy?: 1 - Never  Interpreter Needed?: No  Information entered by :: Dellie Fergusson, LPN   Activities of Daily Living    06/24/2023    1:20 PM  In your present state of health, do you have any difficulty performing the following activities:  Hearing? 0  Vision? 0  Difficulty concentrating or making decisions? 0  Walking or climbing stairs? 1  Comment SHOB  Dressing or bathing? 0  Doing errands, shopping? 0  Preparing Food and eating ? N  Using the Toilet? N  In the past six months, have you accidently leaked urine? N  Do you have  problems with loss of bowel control? N  Managing your Medications? N  Managing your Finances? N  Housekeeping or managing your Housekeeping? N    Patient Care  Team: Lamon Pillow, MD as PCP - General (Family Medicine) End, Veryl Gottron, MD as PCP - Cardiology (Cardiology) Marnee Sink, MD as Consulting Physician (Gastroenterology) Marquita Situ Magali Schmitz, MD as Consulting Physician (General Surgery) Reche Canales, OD (Optometry)  Indicate any recent Medical Services you may have received from other than [facility] providers in the past year (date may be approximate).     Assessment:   This is a routine wellness examination for [place].  Hearing/Vision screen Hearing Screening - Comments:: NO AIDS Vision Screening - Comments:: WEARS GLASSES ALL THE TIME- DR. NICE   Goals Addressed             This Visit's Progress    DIET - INCREASE WATER  INTAKE         Depression Screen     06/24/2023    1:17 PM 06/23/2023   10:10 AM 03/14/2023    1:58 PM 12/20/2022    2:29 PM 10/11/2022    1:41 PM 06/19/2022    1:40 PM 05/20/2022   10:49 AM  PHQ 2/9 Scores  PHQ - 2 Score 4 6 4 4 4 4 6   PHQ- 9 Score 6 14 8 7 11 7 9     Fall Risk     06/24/2023    1:20 PM 06/23/2023   10:10 AM 06/19/2022    1:36 PM 05/20/2022   10:49 AM 01/23/2022   11:34 AM  Fall Risk   Falls in the past year? 1 0 0 0 1  Number falls in past yr: 1 0 0 0 1  Injury with Fall? 0 0 0 0 0  Risk for fall due to : History of fall(s) No Fall Risks No Fall Risks  No Fall Risks  Follow up Falls evaluation completed;Falls prevention discussed  Falls prevention discussed;Education provided      MEDICARE RISK AT HOME:  Medicare Risk at Home Any stairs in or around the home?: Yes If so, are there any without handrails?: No Home free of loose throw rugs in walkways, pet beds, electrical cords, etc?: Yes Adequate lighting in your home to reduce risk of falls?: Yes Life alert?: No Use of a cane, walker or w/c?: Yes (CANE WHEN SCIATICA  FLARES) Grab bars in the bathroom?: Yes Shower chair or bench in shower?: Yes Elevated toilet seat or a handicapped toilet?: Yes  TIMED UP AND GO:  Was the test performed?  No  Cognitive Function: 6CIT completed        06/24/2023    1:24 PM 06/19/2022    1:53 PM 09/25/2016   10:29 AM  6CIT Screen  What Year? 0 points 0 points 0 points  What month? 0 points 0 points 0 points  What time? 0 points 0 points 0 points  Count back from 20 0 points 0 points 0 points  Months in reverse 0 points 0 points 0 points  Repeat phrase 0 points 2 points 2 points  Total Score 0 points 2 points 2 points    Immunizations Immunization History  Administered Date(s) Administered   Pneumococcal Conjugate-13 01/19/2014    Screening Tests Health Maintenance  Topic Date Due   COVID-19 Vaccine (1) Never done   Zoster Vaccines- Shingrix (1 of 2) Never done   Pneumonia Vaccine 74+ Years old (2 of 2 - PPSV23) 01/20/2015   DEXA SCAN  10/20/2018   INFLUENZA VACCINE  09/19/2023   Medicare Annual Wellness (AWV)  06/23/2024   Colonoscopy  10/26/2024   Hepatitis C Screening  Completed  HPV VACCINES  Aged Out   Meningococcal B Vaccine  Aged Out   DTaP/Tdap/Td  Discontinued    Health Maintenance  Health Maintenance Due  Topic Date Due   COVID-19 Vaccine (1) Never done   Zoster Vaccines- Shingrix (1 of 2) Never done   Pneumonia Vaccine 18+ Years old (2 of 2 - PPSV23) 01/20/2015   DEXA SCAN  10/20/2018   Health Maintenance Items Addressed: DECLINES BONE DENSITY SCAN, AGED OUT OF COLONOSCOPY & MAMMOGRAM; DOESN'T TAKE VACCINES  Additional Screening:  Vision Screening: Recommended annual ophthalmology exams for early detection of glaucoma and other disorders of the eye.  Dental Screening: Recommended annual dental exams for proper oral hygiene  Community Resource Referral / Chronic Care Management: CRR required this visit?  No   CCM required this visit?  No     Plan:     I have personally  reviewed and noted the following in the patient's chart:   Medical and social history Use of alcohol, tobacco or illicit drugs  Current medications and supplements including opioid prescriptions. Patient is currently taking opioid prescriptions. Information provided to patient regarding non-opioid alternatives. Patient advised to discuss non-opioid treatment plan with their provider. Functional ability and status Nutritional status Physical activity Advanced directives List of other physicians Hospitalizations, surgeries, and ER visits in previous 12 months Vitals Screenings to include cognitive, depression, and falls Referrals and appointments  In addition, I have reviewed and discussed with patient certain preventive protocols, quality metrics, and best practice recommendations. A written personalized care plan for preventive services as well as general preventive health recommendations were provided to patient.     Pinky Bright, LPN   05/25/9627   After Visit Summary: (MyChart) Due to this being a telephonic visit, the after visit summary with patients personalized plan was offered to patient via MyChart   Notes: Nothing significant to report at this time.

## 2023-06-24 NOTE — Patient Instructions (Addendum)
 [name] , Thank you for taking time to come for your Medicare Wellness Visit. I appreciate your ongoing commitment to your health goals. Please review the following plan we discussed and let me know if I can assist you in the future.   Referrals/Orders/Follow-Ups/Clinician Recommendations: NONE  This is a list of the screening recommended for you and due dates:  Health Maintenance  Topic Date Due   COVID-19 Vaccine (1) Never done   Zoster (Shingles) Vaccine (1 of 2) Never done   Pneumonia Vaccine (2 of 2 - PPSV23) 01/20/2015   DEXA scan (bone density measurement)  10/20/2018   Flu Shot  09/19/2023   Medicare Annual Wellness Visit  06/23/2024   Colon Cancer Screening  10/26/2024   Hepatitis C Screening  Completed   HPV Vaccine  Aged Out   Meningitis B Vaccine  Aged Out   DTaP/Tdap/Td vaccine  Discontinued    Advanced directives: (ACP Link)Information on Advanced Care Planning can be found at [place]  Secretary of [facility] Advance Health Care Directives Advance Health Care Directives. [email or web address]    Next Medicare Annual Wellness Visit scheduled for next year: Yes  06/30/24 @ 10:50 AM BY PHONE  Have you seen your provider in the last 6 months (3 months if uncontrolled diabetes)? Yes

## 2023-07-12 ENCOUNTER — Other Ambulatory Visit: Payer: Self-pay | Admitting: Family Medicine

## 2023-07-12 DIAGNOSIS — F419 Anxiety disorder, unspecified: Secondary | ICD-10-CM

## 2023-07-15 ENCOUNTER — Ambulatory Visit: Payer: Self-pay

## 2023-07-15 NOTE — Telephone Encounter (Signed)
 Chief Complaint: UTI Symptoms: - see notes Frequency: 3-4 days Pertinent Negatives: Patient denies - n/a Disposition: [] ED /[x] Urgent Care (no appt availability in office) / [] Appointment(In office/virtual)/ []  [place] [facility]/ [] Home Care/ [] Refused Recommended Disposition /[] [place] [facility]/ []  Follow-up with PCP Additional Notes: Patient called in stating she has a suspected UTI. Patient is experiencing increased urge to urinate, increased frequency, back pain, suspected mild fever, and burning with urination. Patient appt made for early tomorrow morning at [facility].    Copied from CRM [id number]. Topic: Clinical - Medical Advice >> Jul 15, 2023  4:20 PM Tiffany S wrote: Reason for CRM: Patient symptoms of UTI is coming back patient is asking for something stronger for the UTI Reason for Disposition  Side (flank) or lower back pain present  Answer Assessment - Initial Assessment Questions 1. SYMPTOM: "What's the main symptom you're concerned about?" (e.g., frequency, incontinence)     Increased urge to urinate, increased frequency, burning with urination 2. ONSET: "When did the  symptoms  start?"     3-4 days 3. PAIN: "Is there any pain?" If Yes, ask: "How bad is it?" (Scale: 1-10; mild, moderate, severe)     Ranges 4. CAUSE: "What do you think is causing the symptoms?"     UTI 5. OTHER SYMPTOMS: "Do you have any other symptoms?" (e.g., blood in urine, fever, flank pain, pain with urination)     Fever, back pain  Protocols used: Urinary Symptoms-A-AH

## 2023-07-16 ENCOUNTER — Ambulatory Visit
Admission: RE | Admit: 2023-07-16 | Discharge: 2023-07-16 | Disposition: A | Discharge status: Home / Self Care | Source: Ambulatory Visit

## 2023-07-16 VITALS — BP 136/86 | HR 76 | Temp 98.1°F | Resp 15

## 2023-07-16 DIAGNOSIS — N3001 Acute cystitis with hematuria: Secondary | ICD-10-CM | POA: Diagnosis not present

## 2023-07-16 DIAGNOSIS — B379 Candidiasis, unspecified: Secondary | ICD-10-CM | POA: Diagnosis not present

## 2023-07-16 DIAGNOSIS — T3695XA Adverse effect of unspecified systemic antibiotic, initial encounter: Secondary | ICD-10-CM | POA: Diagnosis not present

## 2023-07-16 LAB — URINALYSIS, W/ REFLEX TO CULTURE (INFECTION SUSPECTED)
Bilirubin Urine: NEGATIVE
Glucose, UA: NEGATIVE mg/dL
Ketones, ur: NEGATIVE mg/dL
Leukocytes,Ua: NEGATIVE
Nitrite: NEGATIVE
Protein, ur: NEGATIVE mg/dL
Specific Gravity, Urine: 1.015 (ref 1.005–1.030)
WBC, UA: NONE SEEN WBC/hpf (ref 0–5)
pH: 7 (ref 5.0–8.0)

## 2023-07-16 MED ORDER — FLUCONAZOLE 150 MG PO TABS: 150.0000 mg | ORAL_TABLET | Freq: Every day | ORAL | 0 refills | Status: DC

## 2023-07-16 MED ORDER — NITROFURANTOIN MONOHYD MACRO 100 MG PO CAPS: 100.0000 mg | ORAL_CAPSULE | Freq: Two times a day (BID) | ORAL | 0 refills | Status: DC

## 2023-07-16 MED ORDER — PHENAZOPYRIDINE HCL 200 MG PO TABS: 200.0000 mg | ORAL_TABLET | Freq: Three times a day (TID) | ORAL | 0 refills | Status: DC

## 2023-07-16 NOTE — Discharge Instructions (Addendum)
  1. Acute cystitis with hematuria (Primary) - Urinalysis completed in UC shows few bacteria, trace blood, no leukocytes, no nitrite, these findings are possibly indicative of urinary tract infection.  Will send culture for further information - Urine Culture collected in UC and sent to lab for further testing results should be available in 2 to 3 days - phenazopyridine (PYRIDIUM) 200 MG tablet; Take 1 tablet (200 mg total) by mouth 3 (three) times daily.  Dispense: 6 tablet; Refill: 0 - nitrofurantoin, macrocrystal-monohydrate, (MACROBID) 100 MG capsule; Take 1 capsule (100 mg total) by mouth 2 (two) times daily.  Dispense: 10 capsule; Refill: 0  2. Hx of Antibiotic-induced yeast infection - fluconazole  (DIFLUCAN ) 150 MG tablet; Take 1 tablet (150 mg total) by mouth daily. If symptoms persist take second dose of Diflucan  150 mg on day 3 to treat vaginal Candida.  Dispense: 2 tablet; Refill: 0 -Continue to monitor symptoms for any change in severity if there is any escalation of current symptoms or development of new symptoms follow-up in ER for further evaluation and management.

## 2023-07-16 NOTE — ED Provider Notes (Signed)
 [facility]  Note:  This document was prepared using Conservation officer, historic buildings and may include unintentional dictation errors.  MRN: [id number] DOB: [birth year]  Subjective:   [name] is a 78 y.o. female presenting for dysuria and increased urinary frequency x 3 to 4 days.  Patient denies taking any over-the-counter medication to treat symptoms.  No severe abdominal pain, flank pain, vaginal discharge, or fever.  Patient reports a past history of urinary tract infection most recently approximately 3 months ago.  Patient also reports that she frequently gets vaginal candidiasis after taking antibiotic therapy and would like Diflucan  for treatment of recurrent yeast infections.  No current facility-administered medications for this encounter.  Current Outpatient Medications:    ALPRAZolam  (XANAX ) 1 MG tablet, TAKE ONE-HALF TO ONE TABLET BY MOUTH THREE TIMES DAILY AS NEEDED FOR ANXIETY, Disp: 90 tablet, Rfl: 1   aspirin  EC 81 MG tablet, Take 81 mg by mouth daily., Disp: , Rfl:    escitalopram  (LEXAPRO ) 10 MG tablet, Take 1 tablet (10 mg total) by mouth daily., Disp: 90 tablet, Rfl: 4   ezetimibe  (ZETIA ) 10 MG tablet, Take 1 tablet (10 mg total) by mouth daily., Disp: 90 tablet, Rfl: 2   fluconazole  (DIFLUCAN ) 150 MG tablet, Take 1 tablet (150 mg total) by mouth daily. If symptoms persist take second dose of Diflucan  150 mg on day 3 to treat vaginal Candida., Disp: 2 tablet, Rfl: 0   nitrofurantoin, macrocrystal-monohydrate, (MACROBID) 100 MG capsule, Take 1 capsule (100 mg total) by mouth 2 (two) times daily., Disp: 10 capsule, Rfl: 0   omeprazole  (PRILOSEC) 40 MG capsule, Take 1 capsule (40 mg total) by mouth daily., Disp: 30 capsule, Rfl: 3   phenazopyridine (PYRIDIUM) 200 MG tablet, Take 1 tablet (200 mg total) by mouth 3 (three) times daily., Disp: 6 tablet, Rfl: 0   rosuvastatin  (CRESTOR ) 20 MG tablet, Take 1 tablet (20 mg total) by mouth daily., Disp: 90 tablet, Rfl:  1   verapamil  (CALAN -SR) 120 MG CR tablet, Take 1 tablet (120 mg total) by mouth at bedtime., Disp: 90 tablet, Rfl: 1   albuterol  (VENTOLIN  HFA) 108 (90 Base) MCG/ACT inhaler, TAKE 2 PUFFS BY MOUTH EVERY 6 HOURS AS NEEDED FOR WHEEZE OR SHORTNESS OF BREATH, Disp: 8.5 g, Rfl: 2   clotrimazole  (LOTRIMIN ) 1 % cream, Apply 1 application topically 2 (two) times daily., Disp: 30 g, Rfl: 0   clotrimazole  (MYCELEX ) 10 MG troche, Take 1 tablet (10 mg total) by mouth 5 (five) times daily., Disp: 25 tablet, Rfl: 0   dicyclomine  (BENTYL ) 10 MG capsule, Take 1 capsule (10 mg total) by mouth 3 (three) times daily before meals. As needed for stomach pains, Disp: 90 capsule, Rfl: 1   fluticasone  (FLONASE ) 50 MCG/ACT nasal spray, PLACE 1-2 SPRAYS INTO BOTH NOSTRILS DAILY AS NEEDED FOR ALLERGIES OR RHINITIS., Disp: 48 mL, Rfl: 1   ibuprofen  (ADVIL ) 800 MG tablet, Take 1 tablet (800 mg total) by mouth daily as needed (severe pain)., Disp: 30 tablet, Rfl: 1   lactulose  (CHRONULAC ) 10 GM/15ML solution, Take 45 mLs (30 g total) by mouth daily as needed for mild constipation. (Patient not taking: Reported on 06/24/2023), Disp: 236 mL, Rfl: 0   ondansetron  (ZOFRAN -ODT) 4 MG disintegrating tablet, DISSOLVE 1 TABLET IN MOUTH EVERY 8 HOURS AS NEEDED FOR NAUSEA OR VOMITING, Disp: 20 tablet, Rfl: 2   oxyCODONE  (ROXICODONE ) 5 MG immediate release tablet, Take 1 tablet (5 mg total) by mouth every 4 (four) hours as  needed for severe pain. (Patient not taking: Reported on 06/24/2023), Disp: 30 tablet, Rfl: 0   promethazine  (PHENERGAN ) 12.5 MG tablet, TAKE 1 TO 2 TABLETS BY MOUTH EVERY 8 HOURS AS NEEDED FOR NAUSEA FOR VOMITING, Disp: 20 tablet, Rfl: 2   Spacer/Aero-Holding Chambers (EASIVENT) inhaler, Use as needed with inhaler, Disp: , Rfl:    Spacer/Aero-Holding Chambers (EQ SPACE CHAMBER ANTI-STATIC) DEVI, Inhale into the lungs as needed., Disp: , Rfl:    Allergies  Allergen Reactions   Levofloxacin      Tongue and mouth swelling    Calcium  Channel Blockers     Other reaction(s): Dizziness   Amoxicillin  Other (See Comments)    Causes yeast infection Has patient had a PCN reaction causing immediate rash, facial/tongue/throat swelling, SOB or lightheadedness with hypotension: No Has patient had a PCN reaction causing severe rash involving mucus membranes or skin necrosis: No Has patient had a PCN reaction that required hospitalization: No Has patient had a PCN reaction occurring within the last 10 years: No If all of the above answers are "NO", then may proceed with Cephalosporin use.    Nickel Rash   Penicillins Other (See Comments)    Causes yeast infection Has patient had a PCN reaction causing immediate rash, facial/tongue/throat swelling, SOB or lightheadedness with hypotension: No Has patient had a PCN reaction causing severe rash involving mucus membranes or skin necrosis: No Has patient had a PCN reaction that required hospitalization: No Has patient had a PCN reaction occurring within the last 10 years: No If all of the above answers are "NO", then may proceed with Cephalosporin use.    Past Medical History:  Diagnosis Date   Abdominal hernia    Anxiety    Arthritis    lower spine   Back pain    lower back - S/P lifting injury   Closed fracture of lateral malleolus of right fibula 10/11/2020   Complication of anesthesia    makes hair brittle   COVID-19 09/20/2022   GERD (gastroesophageal reflux disease)    Hyperlipidemia    Hypertension    Neuromuscular disorder (HCC)    numbness legs and feet s/p lower back injury   Partial bowel obstruction (HCC) 02/04/2018   Partial small bowel obstruction (HCC) 02/04/2018   SBO (small bowel obstruction) (HCC) 03/13/2017   Stroke (HCC)    "mini - strokes" 2009 - no deficit   Vertigo    none recently   Vitamin D  deficiency    Wears contact lenses      Past Surgical History:  Procedure Laterality Date   ABDOMINAL HYSTERECTOMY  1987   CARDIAC  CATHETERIZATION  02/2007   COLON SURGERY  03/12/2012   Dr Willistine Hartigan   COLONOSCOPY WITH PROPOFOL  N/A 09/26/2014   Procedure: COLONOSCOPY WITH PROPOFOL ;  Surgeon: Marnee Sink, MD;  Location: [facility];  Service: Endoscopy;  Laterality: N/A;  marker (tattoo) used in colon   COLONOSCOPY WITH PROPOFOL  N/A 10/27/2019   Procedure: COLONOSCOPY WITH PROPOFOL ;  Surgeon: Marshall Skeeter, MD;  Location: [facility];  Service: Endoscopy;  Laterality: N/A;   ESOPHAGEAL DILATION N/A 02/02/2016   Procedure: ESOPHAGEAL DILATION;  Surgeon: Marnee Sink, MD;  Location: [facility];  Service: Endoscopy;  Laterality: N/A;   ESOPHAGOGASTRODUODENOSCOPY (EGD) WITH PROPOFOL  N/A 02/02/2016   Procedure: ESOPHAGOGASTRODUODENOSCOPY (EGD) WITH PROPOFOL ;  Surgeon: Marnee Sink, MD;  Location: [facility];  Service: Endoscopy;  Laterality: N/A;   LAPAROSCOPIC ABDOMINAL EXPLORATION N/A 11/08/2019   Procedure: LAPAROSCOPIC ABDOMINAL EXPLORATION;  Surgeon:  Marshall Skeeter, MD;  Location: [facility];  Service: General;  Laterality: N/A;  POSSIBLE LAPAROTOMY   POLYPECTOMY  09/26/2014   Procedure: POLYPECTOMY;  Surgeon: Marnee Sink, MD;  Location: [facility];  Service: Endoscopy;;   TUBAL LIGATION  1972   VENTRAL HERNIA REPAIR N/A 11/08/2019   Procedure: HERNIA REPAIR VENTRAL ADULT;  Surgeon: Marshall Skeeter, MD;  Location: [facility];  Service: General;  Laterality: N/A;  possible ventral hernia repair    Family History  Problem Relation Age of Onset   Diabetes Mother    Hypertension Mother    Mental illness Mother    Heart failure Mother 58   Cancer Father        lung cancer   Drug abuse Brother    Cancer Brother    Multiple sclerosis Brother     Social History   Tobacco Use   Smoking status: Never   Smokeless tobacco: Never  Vaping Use   Vaping status: Never Used  Substance Use Topics   Alcohol use: No    Alcohol/week: 0.0 standard drinks of alcohol   Drug use: No     ROS Refer to HPI for ROS details.  Objective:   Vitals: BP 136/86 (BP Location: Right Arm)   Pulse 76   Temp 98.1 F (36.7 C) (Oral)   Resp 15   SpO2 96%   Physical Exam Vitals and nursing note reviewed.  Constitutional:      General: She is not in acute distress.    Appearance: Normal appearance. She is well-developed. She is not ill-appearing or toxic-appearing.  HENT:     Head: Normocephalic and atraumatic.  Cardiovascular:     Rate and Rhythm: Normal rate.  Pulmonary:     Effort: Pulmonary effort is normal. No respiratory distress.  Abdominal:     General: There is no distension.     Palpations: Abdomen is soft.     Tenderness: There is no abdominal tenderness. There is no right CVA tenderness, left CVA tenderness, guarding or rebound.  Skin:    General: Skin is warm and dry.  Neurological:     General: No focal deficit present.     Mental Status: She is alert and oriented to person, place, and time.  Psychiatric:        Mood and Affect: Mood normal.        Behavior: Behavior normal.     Procedures  Results for orders placed or performed during the hospital encounter of 07/16/23 (from the past 24 hours)  Urinalysis, w/ Reflex to Culture (Infection Suspected) -Urine, Clean Catch     Status: Abnormal   Collection Time: 07/16/23 10:09 AM  Result Value Ref Range   Specimen Source URINE, CLEAN CATCH    Color, Urine YELLOW YELLOW   APPearance CLEAR CLEAR   Specific Gravity, Urine 1.015 1.005 - 1.030   pH 7.0 5.0 - 8.0   Glucose, UA NEGATIVE NEGATIVE mg/dL   Hgb urine dipstick TRACE (A) NEGATIVE   Bilirubin Urine NEGATIVE NEGATIVE   Ketones, ur NEGATIVE NEGATIVE mg/dL   Protein, ur NEGATIVE NEGATIVE mg/dL   Nitrite NEGATIVE NEGATIVE   Leukocytes,Ua NEGATIVE NEGATIVE   Squamous Epithelial / HPF 0-5 0 - 5 /HPF   WBC, UA NONE SEEN 0 - 5 WBC/hpf   RBC / HPF 0-5 0 - 5 RBC/hpf   Bacteria, UA FEW (A) NONE SEEN    Assessment and Plan :     Discharge  Instructions  1. Acute cystitis with hematuria (Primary) - Urinalysis completed in UC shows few bacteria, trace blood, no leukocytes, no nitrite, these findings are possibly indicative of urinary tract infection.  Will send culture for further information - Urine Culture collected in UC and sent to lab for further testing results should be available in 2 to 3 days - phenazopyridine (PYRIDIUM) 200 MG tablet; Take 1 tablet (200 mg total) by mouth 3 (three) times daily.  Dispense: 6 tablet; Refill: 0 - nitrofurantoin, macrocrystal-monohydrate, (MACROBID) 100 MG capsule; Take 1 capsule (100 mg total) by mouth 2 (two) times daily.  Dispense: 10 capsule; Refill: 0  2. Hx of Antibiotic-induced yeast infection - fluconazole  (DIFLUCAN ) 150 MG tablet; Take 1 tablet (150 mg total) by mouth daily. If symptoms persist take second dose of Diflucan  150 mg on day 3 to treat vaginal Candida.  Dispense: 2 tablet; Refill: 0 -Continue to monitor symptoms for any change in severity if there is any escalation of current symptoms or development of new symptoms follow-up in ER for further evaluation and management.    Garlan Drewes B Shonte Soderlund   Marinna Blane, [place] B, [place] 07/16/23 1101

## 2023-07-16 NOTE — ED Triage Notes (Signed)
 Sx x 4 days  Urinary frequency and dysuria

## 2023-07-17 LAB — URINE CULTURE
Culture: NO GROWTH
Special Requests: NORMAL

## 2023-07-18 ENCOUNTER — Ambulatory Visit: Payer: Self-pay

## 2023-08-31 DIAGNOSIS — S299XXA Unspecified injury of thorax, initial encounter: Secondary | ICD-10-CM | POA: Diagnosis not present

## 2023-08-31 DIAGNOSIS — Z880 Allergy status to penicillin: Secondary | ICD-10-CM | POA: Diagnosis not present

## 2023-08-31 DIAGNOSIS — Z91048 Other nonmedicinal substance allergy status: Secondary | ICD-10-CM | POA: Diagnosis not present

## 2023-08-31 DIAGNOSIS — S0990XA Unspecified injury of head, initial encounter: Secondary | ICD-10-CM | POA: Diagnosis not present

## 2023-08-31 DIAGNOSIS — E785 Hyperlipidemia, unspecified: Secondary | ICD-10-CM | POA: Diagnosis not present

## 2023-08-31 DIAGNOSIS — S3992XA Unspecified injury of lower back, initial encounter: Secondary | ICD-10-CM | POA: Diagnosis not present

## 2023-08-31 DIAGNOSIS — Z881 Allergy status to other antibiotic agents status: Secondary | ICD-10-CM | POA: Diagnosis not present

## 2023-08-31 DIAGNOSIS — M542 Cervicalgia: Secondary | ICD-10-CM | POA: Diagnosis not present

## 2023-08-31 DIAGNOSIS — M549 Dorsalgia, unspecified: Secondary | ICD-10-CM | POA: Diagnosis not present

## 2023-08-31 DIAGNOSIS — S3991XA Unspecified injury of abdomen, initial encounter: Secondary | ICD-10-CM | POA: Diagnosis not present

## 2023-08-31 DIAGNOSIS — S199XXA Unspecified injury of neck, initial encounter: Secondary | ICD-10-CM | POA: Diagnosis not present

## 2023-08-31 DIAGNOSIS — M47812 Spondylosis without myelopathy or radiculopathy, cervical region: Secondary | ICD-10-CM | POA: Diagnosis not present

## 2023-08-31 DIAGNOSIS — I10 Essential (primary) hypertension: Secondary | ICD-10-CM | POA: Diagnosis not present

## 2023-08-31 DIAGNOSIS — R519 Headache, unspecified: Secondary | ICD-10-CM | POA: Diagnosis not present

## 2023-08-31 DIAGNOSIS — S24109A Unspecified injury at unspecified level of thoracic spinal cord, initial encounter: Secondary | ICD-10-CM | POA: Diagnosis not present

## 2023-08-31 DIAGNOSIS — K219 Gastro-esophageal reflux disease without esophagitis: Secondary | ICD-10-CM | POA: Diagnosis not present

## 2023-08-31 DIAGNOSIS — Z79899 Other long term (current) drug therapy: Secondary | ICD-10-CM | POA: Diagnosis not present

## 2023-08-31 DIAGNOSIS — S3993XA Unspecified injury of pelvis, initial encounter: Secondary | ICD-10-CM | POA: Diagnosis not present

## 2023-09-03 ENCOUNTER — Telehealth: Payer: Self-pay

## 2023-09-03 DIAGNOSIS — J301 Allergic rhinitis due to pollen: Secondary | ICD-10-CM

## 2023-09-03 DIAGNOSIS — K219 Gastro-esophageal reflux disease without esophagitis: Secondary | ICD-10-CM

## 2023-09-03 DIAGNOSIS — R051 Acute cough: Secondary | ICD-10-CM

## 2023-09-03 DIAGNOSIS — J329 Chronic sinusitis, unspecified: Secondary | ICD-10-CM

## 2023-09-03 NOTE — Telephone Encounter (Unsigned)
 Copied from CRM [id number]. Topic: Clinical - Medication Question >> Sep 03, 2023 11:50 AM Chasity T wrote: Reason for CRM: Patient is requesting for medication for acid reflux to be sent to pharmacy.

## 2023-09-04 MED ORDER — OMEPRAZOLE 40 MG PO CPDR
40.0000 mg | DELAYED_RELEASE_CAPSULE | Freq: Every day | ORAL | 3 refills | Status: DC
Start: 2023-09-04 — End: 2024-01-11

## 2023-09-14 ENCOUNTER — Other Ambulatory Visit: Payer: Self-pay | Admitting: Family Medicine

## 2023-09-15 ENCOUNTER — Telehealth: Payer: Self-pay

## 2023-09-15 NOTE — Telephone Encounter (Signed)
 Patient is referring to clidinium-chlordiazePOXIDE  (LIBRAX) 5-2.5 MG capsule and wants to know if this could be refilled at [facility] [address]

## 2023-09-15 NOTE — Telephone Encounter (Signed)
 Called and notified patient of Dr. Lyle response.

## 2023-09-15 NOTE — Telephone Encounter (Signed)
 She probably means Dicyclomine     Copied from CRM [id number]. Topic: Clinical - Prescription Issue >> Sep 15, 2023 11:46 AM Vena H wrote: Reason for CRM: Pt states her pharmacy is stating that they do not have the medication that relaxes her stomach on file, she does not know the name of it. Please reach out to pt

## 2023-09-15 NOTE — Telephone Encounter (Signed)
 She is taking alprazolam  (Xanax ), and you can't take clidinium-chlordiazePOXIDE  with xanax . Dicyclomine  was refilled last week.

## 2023-09-19 ENCOUNTER — Telehealth: Payer: Self-pay | Admitting: Family Medicine

## 2023-09-19 NOTE — Telephone Encounter (Signed)
 Patient is returning your call & wants to speak with you.SABRAShe has questions about her medications.

## 2023-09-19 NOTE — Telephone Encounter (Signed)
 See encounter from 7/28- patient needed a reminder of which medication it was that she is not supposed to take with alprazolam 

## 2023-09-30 ENCOUNTER — Other Ambulatory Visit: Payer: Self-pay | Admitting: Family Medicine

## 2023-09-30 DIAGNOSIS — F419 Anxiety disorder, unspecified: Secondary | ICD-10-CM

## 2023-10-07 ENCOUNTER — Ambulatory Visit: Payer: Self-pay

## 2023-10-07 NOTE — Telephone Encounter (Signed)
 FYI Only or Action Required?: FYI only for provider.  Patient was last seen in primary care on 06/23/2023 by Wanda Nancyann BRAVO, MD.  Called Nurse Triage reporting Otalgia.  Symptoms began today.  Interventions attempted: Nothing.  Symptoms are: unchanged.  Triage Disposition: See HCP Within 4 Hours (Or PCP Triage)  Patient/caregiver understands and will follow disposition?: No appointment, going to urgent care      Copied from CRM #[id number]. Topic: Clinical - Red Word Triage >> Oct 07, 2023  3:47 PM Larissa RAMAN wrote: [facility] that prompted transfer to Nurse Triage: ear pain-Lt ear, dizziness       Reason for Disposition  Walking is very unsteady or feels very dizzy  Answer Assessment - Initial Assessment Questions 1. LOCATION: Which ear is involved?     Left ear  2. ONSET: When did the ear pain start?      This morning  3. SEVERITY: How bad is the pain?  (Scale 1-10; mild, moderate or severe)     Can get up to 9/10 but comes and goes  4. URI SYMPTOMS: Do you have a runny nose or cough?     No 5. FEVER: Do you have a fever? If Yes, ask: What is your temperature, how was it measured, and when did it start?     No 6. CAUSE: Have you been swimming recently?, How often do you use Q-TIPS?, Have you had any recent air travel or scuba diving?     Unsure  7. OTHER SYMPTOMS: Do you have any other symptoms? (e.g., decreased hearing, dizziness, headache, stiff neck, vomiting)     Dizziness  Protocols used: Earache-A-AH

## 2023-10-24 ENCOUNTER — Ambulatory Visit: Payer: Self-pay

## 2023-10-24 ENCOUNTER — Encounter: Payer: Self-pay | Admitting: Family Medicine

## 2023-10-24 ENCOUNTER — Ambulatory Visit (INDEPENDENT_AMBULATORY_CARE_PROVIDER_SITE_OTHER): Admitting: Family Medicine

## 2023-10-24 VITALS — BP 137/78 | HR 77 | Temp 97.8°F | Resp 16 | Wt 179.9 lb

## 2023-10-24 DIAGNOSIS — N3000 Acute cystitis without hematuria: Secondary | ICD-10-CM

## 2023-10-24 DIAGNOSIS — R350 Frequency of micturition: Secondary | ICD-10-CM | POA: Diagnosis not present

## 2023-10-24 DIAGNOSIS — R319 Hematuria, unspecified: Secondary | ICD-10-CM

## 2023-10-24 LAB — POCT URINALYSIS DIPSTICK
Bilirubin, UA: NEGATIVE
Glucose, UA: NEGATIVE
Ketones, UA: NEGATIVE
Leukocytes, UA: NEGATIVE
Nitrite, UA: NEGATIVE
Protein, UA: NEGATIVE
Spec Grav, UA: 1.01 (ref 1.010–1.025)
Urobilinogen, UA: 0.2 U/dL
pH, UA: 7 (ref 5.0–8.0)

## 2023-10-24 MED ORDER — SULFAMETHOXAZOLE-TRIMETHOPRIM 800-160 MG PO TABS: 1.0000 | ORAL_TABLET | Freq: Two times a day (BID) | ORAL | 0 refills | Status: AC

## 2023-10-24 MED ORDER — FLUCONAZOLE 150 MG PO TABS
ORAL_TABLET | ORAL | 0 refills | Status: DC
Start: 2023-10-24 — End: 2023-11-11

## 2023-10-24 NOTE — Progress Notes (Signed)
 ACUTE VISIT   Patient: [name]   DOB: 03-02-1945   77 y.o. Female  MRN: [id number]   PCP: Gasper Nancyann BRAVO, MD  Chief Complaint  Patient presents with   Urinary Frequency    X3 days   Fatigue   Subjective    HPI HPI     Urinary Frequency    Additional comments: X3 days      Last edited by Rosas, Joseline E, CMA on 10/24/2023  3:15 PM.       Discussed the use of AI scribe software for clinical note transcription with the patient, who gave verbal consent to proceed.  History of Present Illness [name] is a 78 year old female who presents with urinary frequency and fatigue.  She has been experiencing urinary discomfort for the past three days, characterized by increased urinary frequency and a burning sensation during urination. No hematuria is noted. She also reports fatigue accompanying these symptoms. She has a history of similar symptoms in the past, for which she was treated with antibiotics, although she does not recall the specific medication used.  She has multiple allergies, including levofloxacin , amoxicillin , nickel, and penicillin. Amoxicillin  causes her to develop a yeast infection, for which she typically receives Diflucan  alongside antibiotic treatment.  Her past medical history includes acid reflux, depression, anxiety, diverticulitis or diverticulosis, hypertension, history of colon polyp, hyperlipidemia, mood disorder, and prediabetes. She is currently on Xanax  1 mg, which she reports makes her feel 'real snappy' and 'real hateful', affecting her interactions with her husband.  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     Medications: Outpatient Medications Prior to Visit  Medication Sig   albuterol  (VENTOLIN  HFA) 108 (90 Base) MCG/ACT inhaler TAKE 2 PUFFS BY MOUTH EVERY 6 HOURS AS NEEDED FOR WHEEZE OR SHORTNESS OF BREATH   ALPRAZolam  (XANAX ) 1 MG tablet TAKE 1/2 TO 1 (ONE-HALF TO  ONE) TABLET BY MOUTH THREE TIMES DAILY AS NEEDED FOR ANXIETY   aspirin  EC 81 MG tablet Take 81 mg by mouth daily.   clotrimazole  (LOTRIMIN ) 1 % cream Apply 1 application topically 2 (two) times daily.   dicyclomine  (BENTYL ) 10 MG capsule TAKE 1 CAPSULE BY MOUTH THREE TIMES DAILY BEFORE MEAL(S) AS NEEDED FOR STOMACH PAIN   escitalopram  (LEXAPRO ) 10 MG tablet Take 1 tablet (10 mg total) by mouth daily.   ezetimibe  (ZETIA ) 10 MG tablet Take 1 tablet (10 mg total) by mouth daily.   fluticasone  (FLONASE ) 50 MCG/ACT nasal spray PLACE 1-2 SPRAYS INTO BOTH NOSTRILS DAILY AS NEEDED FOR ALLERGIES OR RHINITIS.   ibuprofen  (ADVIL ) 800 MG tablet Take 1 tablet (800 mg total) by mouth daily as needed (severe pain).   omeprazole  (PRILOSEC) 40 MG capsule Take 1 capsule (40 mg total) by mouth daily.   ondansetron  (ZOFRAN -ODT) 4 MG disintegrating tablet DISSOLVE 1 TABLET IN MOUTH EVERY 8 HOURS AS NEEDED FOR NAUSEA OR VOMITING   phenazopyridine  (PYRIDIUM ) 200 MG tablet Take 1 tablet (200 mg total) by mouth 3 (three) times daily.   promethazine  (PHENERGAN ) 12.5 MG tablet TAKE 1 TO 2 TABLETS BY MOUTH EVERY 8 HOURS AS NEEDED FOR NAUSEA FOR VOMITING   rosuvastatin  (CRESTOR ) 20 MG tablet Take 1 tablet (20 mg total) by mouth daily.   Spacer/Aero-Holding Chambers (EASIVENT) inhaler Use as needed with inhaler   Spacer/Aero-Holding Chambers (EQ SPACE CHAMBER ANTI-STATIC) DEVI Inhale into the lungs as needed.   verapamil  (  CALAN -SR) 120 MG CR tablet Take 1 tablet (120 mg total) by mouth at bedtime.   No facility-administered medications prior to visit.    Last metabolic panel Lab Results  Component Value Date   GLUCOSE 105 (H) 06/23/2023   NA 138 06/23/2023   K 5.0 06/23/2023   CL 104 06/23/2023   CO2 21 06/23/2023   BUN 12 06/23/2023   CREATININE 0.89 06/23/2023   EGFR 68 06/23/2023   CALCIUM  9.5 06/23/2023   PHOS 2.8 09/25/2019   PROT 7.0 06/23/2023   ALBUMIN 4.3 06/23/2023   LABGLOB 2.7 06/23/2023   AGRATIO  1.6 10/15/2021   BILITOT 0.8 06/23/2023   ALKPHOS 72 06/23/2023   AST 16 06/23/2023   ALT 14 06/23/2023   ANIONGAP 11 11/01/2022        Objective    BP 137/78 (BP Location: Right Arm, Patient Position: Sitting, Cuff Size: Normal)   Pulse 77   Temp 97.8 F (36.6 C) (Oral)   Resp 16   Wt 179 lb 14.4 oz (81.6 kg)   SpO2 99%   BMI 31.87 kg/m    Physical Exam   Physical Exam GENERAL: Tired appearing but non-toxic. ABDOMEN: No costovertebral angle tenderness. No abdominal tenderness or distention.   Results for orders placed or performed in visit on 10/24/23  POCT urinalysis dipstick  Result Value Ref Range   Color, UA Light Yellow    Clarity, UA Clear    Glucose, UA Negative Negative   Bilirubin, UA Negative    Ketones, UA Negative    Spec Grav, UA 1.010 1.010 - 1.025   Blood, UA Trace    pH, UA 7.0 5.0 - 8.0   Protein, UA Negative Negative   Urobilinogen, UA 0.2 0.2 or 1.0 E.U./dL   Nitrite, UA Negative    Leukocytes, UA Negative Negative   Appearance     Odor      Assessment & Plan     Assessment and Plan Assessment & Plan Acute cystitis with urinary frequency Acute cystitis with urinary frequency and burning sensation for three days, without hematuria. Possible bladder infection. Differential includes cystitis or bladder infection. Consideration of multiple drug allergies in antibiotic selection. - Order urine analysis and urine culture - Prescribe Bactrim  800-160 mg twice a day for 7 days - Advise to drink plenty of fluids, especially water  - Review urine culture results to adjust antibiotics if necessary  Fatigue Fatigue associated with urinary symptoms, possibly related to acute cystitis.  Multiple drug allergies (penicillin, levofloxacin , amoxicillin , nickel) Multiple drug allergies including penicillin, levofloxacin , amoxicillin , and nickel. Amoxicillin  causes yeast infections rather than an allergic reaction. - Prescribe Diflucan  150 mg to take  after completing Bactrim       No follow-ups on file.        Rockie Agent, MD  [facility] [phone] (phone) [phone] (fax)  [facility]

## 2023-10-24 NOTE — Telephone Encounter (Signed)
 FYI Only or Action Required?: FYI only for provider.  Patient was last seen in primary care on 06/23/2023 by Gasper Nancyann BRAVO, MD.  Called Nurse Triage reporting Dysuria.  Symptoms began 3 days ago.  Symptoms are: gradually worsening.  Triage Disposition: See Physician Within 24 Hours  Patient/caregiver understands and will follow disposition?: Yes        Copied from CRM [id number]. Topic: Clinical - Red Word Triage >> Oct 24, 2023 10:56 AM Winona R wrote: Possible UTI. Burning and frequent urination       Reason for Disposition  Age > 50 years  Answer Assessment - Initial Assessment Questions 1. SEVERITY: How bad is the pain?  (e.g., Scale 1-10; mild, moderate, or severe)     Moderate  3. PATTERN: Is pain present every time you urinate or just sometimes?      Most of the time  4. ONSET: When did the painful urination start?      3 days ago  5. FEVER: Do you have a fever? If Yes, ask: What is your temperature, how was it measured, and when did it start?     Mild intermittent fever  6. PAST UTI: Have you had a urine infection before? If Yes, ask: When was the last time? and What happened that time?      Yes, this is similar  7. CAUSE: What do you think is causing the painful urination?  (e.g., UTI, scratch, Herpes sore)     UTI 8. OTHER SYMPTOMS: Do you have any other symptoms? (e.g., blood in urine, flank pain, genital sores, urgency, vaginal discharge)     Increased urinary frequency  Protocols used: Urination Pain - Female-A-AH

## 2023-10-24 NOTE — Telephone Encounter (Signed)
 Patient call note and symptoms reviewed. Agree with scheduled appt. Will evaluate during OV

## 2023-10-26 LAB — URINALYSIS, MICROSCOPIC ONLY
Bacteria, UA: NONE SEEN
Casts: NONE SEEN /LPF

## 2023-10-26 LAB — URINE CULTURE

## 2023-10-27 ENCOUNTER — Ambulatory Visit: Payer: Self-pay | Admitting: Family Medicine

## 2023-11-06 ENCOUNTER — Ambulatory Visit: Payer: Self-pay

## 2023-11-06 NOTE — Telephone Encounter (Signed)
 FYI Only or Action Required?: Action required by provider: would like a different antibiotic; states causing stomach pain and nausea.  Patient was last seen in primary care on 10/24/2023 by Sharma Coyer, MD.  Called Nurse Triage reporting Abdominal Pain.  Symptoms began several weeks ago.  Interventions attempted: Prescription medications: Bactrim  DS.  Symptoms are: unchanged.  Triage Disposition: No disposition on file.  Patient/caregiver understands and will follow disposition?:      Copied from CRM [id number]. Topic: Clinical - Red Word Triage >> Nov 06, 2023 10:30 AM [name] wrote: Red Word that prompted transfer to Nurse Triage: patient is in pain/nausea aking sulfamethoxazole -trimethoprim  (BACTRIM  DS) 800-160 MG tablet [[id number]]  ENDED and would like something else. Reason for Disposition  [1] MODERATE pain (e.g., interferes with normal activities) AND [2] pain comes and goes (cramps) AND [3] present > 24 hours  (Exception: Pain with Vomiting or Diarrhea - see that Guideline.)  Answer Assessment - Initial Assessment Questions 1. LOCATION: Where does it hurt?      Started bactrim  ds couple of weeks ago on 10/24/22, states haven't finished antibiotic because it causes stomach pain and nausea 2. RADIATION: Does the pain shoot anywhere else? (e.g., chest, back)     Back pain 3. ONSET: When did the pain begin? (e.g., minutes, hours or days ago)      10/24/23 4. SUDDEN: Gradual or sudden onset?     gradual 5. PATTERN Does the pain come and go, or is it constant?     constant 6. SEVERITY: How bad is the pain?  (e.g., Scale 1-10; mild, moderate, or severe)     Moderate to severe 7. RECURRENT SYMPTOM: Have you ever had this type of stomach pain before? If Yes, ask: When was the last time? and What happened that time?      denies 8. CAUSE: What do you think is causing the stomach pain? (e.g., gallstones, recent abdominal surgery)     Bactrim  ds 9.  RELIEVING/AGGRAVATING FACTORS: What makes it better or worse? (e.g., antacids, bending or twisting motion, bowel movement)     no 10. OTHER SYMPTOMS: Do you have any other symptoms? (e.g., back pain, diarrhea, fever, urination pain, vomiting)       nausea 11. PREGNANCY: Is there any chance you are pregnant? When was your last menstrual period?       na  Protocols used: Abdominal Pain - Female-A-AH

## 2023-11-07 ENCOUNTER — Telehealth: Payer: Self-pay

## 2023-11-07 NOTE — Telephone Encounter (Signed)
 Copied from CRM [id number]. Topic: Clinical - Medical Advice >> Nov 07, 2023  9:06 AM Leonette SQUIBB wrote: Reason for CRM: patient called saying her stomach is still hurting.  She thought it was from the antibiotic and stopped taking it but it is still hurting.  She hd a UTI  CB#  [phone]

## 2023-11-10 NOTE — Telephone Encounter (Signed)
 Need ov

## 2023-11-10 NOTE — Telephone Encounter (Signed)
 Voicemail is full.   Sent mychart message to tell patient to call for an appt.

## 2023-11-11 ENCOUNTER — Encounter: Payer: Self-pay | Admitting: Family Medicine

## 2023-11-11 ENCOUNTER — Ambulatory Visit (INDEPENDENT_AMBULATORY_CARE_PROVIDER_SITE_OTHER): Admitting: Family Medicine

## 2023-11-11 VITALS — BP 121/83 | HR 74 | Resp 16 | Ht 63.0 in | Wt 180.7 lb

## 2023-11-11 DIAGNOSIS — R319 Hematuria, unspecified: Secondary | ICD-10-CM

## 2023-11-11 DIAGNOSIS — K432 Incisional hernia without obstruction or gangrene: Secondary | ICD-10-CM

## 2023-11-11 DIAGNOSIS — R1084 Generalized abdominal pain: Secondary | ICD-10-CM | POA: Diagnosis not present

## 2023-11-11 DIAGNOSIS — R300 Dysuria: Secondary | ICD-10-CM | POA: Diagnosis not present

## 2023-11-11 LAB — POCT URINALYSIS DIPSTICK
Bilirubin, UA: NEGATIVE
Glucose, UA: NEGATIVE
Ketones, UA: NEGATIVE
Leukocytes, UA: NEGATIVE
Nitrite, UA: NEGATIVE
Protein, UA: POSITIVE — AB
Spec Grav, UA: 1.015 (ref 1.010–1.025)
Urobilinogen, UA: 0.2 U/dL
pH, UA: 6 (ref 5.0–8.0)

## 2023-11-11 NOTE — Progress Notes (Signed)
 Acute visit   Patient: [name]   DOB: [birth year]   78 y.o. Female  MRN: [id number] PCP: Gasper Nancyann BRAVO, MD   Chief Complaint  Patient presents with   Abdominal Pain   Follow-up    Patient was given bactrim  and after taking medication started to take Diflucan . During antibiotics pt started to develop stomach pain with heavy amount of food in system while taking antibiotic. Still ongoing burning when urinating.   Subjective    Discussed the use of AI scribe software for clinical note transcription with the patient, who gave verbal consent to proceed.  History of Present Illness   [name] is a 78 year old female who presents with abdominal pain and urinary symptoms after antibiotic use.  She experiences abdominal discomfort after starting Bactrim  for a suspected urinary tract infection, with pain worsening with each dose. She discontinued the medication due to persistent discomfort. She notes a 'huge knot' in her abdomen, associated with a previously diagnosed hernia, and significant abdominal swelling. Nausea is present without vomiting. Bentyl  (dicyclomine ) is used for abdominal pain, sometimes twice daily due to severity. Bowel habits are normal, with no diarrhea, constipation, or blood in the stool. She maintains regular bowel movements due to a history of bowel obstructions and surgeries. Urinary symptoms include darker urine, increased frequency, and a sensation of needing to urinate even when sitting. A recent urine culture showed mixed flora. Probiotics are taken regularly, with some improvement in symptoms since stopping the antibiotic, though not fully recovered.        Review of Systems  Objective    BP 121/83   Pulse 74   Resp 16   Ht 5' 3 (1.6 m)   Wt 180 lb 11.2 oz (82 kg)   SpO2 100%   BMI 32.01 kg/m  Physical Exam Vitals reviewed.  Constitutional:      General: She is not in acute distress.    Appearance: Normal appearance. She is  well-developed. She is not diaphoretic.  HENT:     Head: Normocephalic and atraumatic.  Eyes:     General: No scleral icterus.    Conjunctiva/sclera: Conjunctivae normal.  Neck:     Thyroid : No thyromegaly.  Cardiovascular:     Rate and Rhythm: Normal rate and regular rhythm.     Pulses: Normal pulses.     Heart sounds: Normal heart sounds. No murmur heard. Pulmonary:     Effort: Pulmonary effort is normal. No respiratory distress.     Breath sounds: Normal breath sounds. No wheezing, rhonchi or rales.  Abdominal:     General: Bowel sounds are normal. There is no distension.     Palpations: Abdomen is soft. There is no mass.     Tenderness: There is no abdominal tenderness.     Hernia: A hernia is present.  Musculoskeletal:     Cervical back: Neck supple.     Right lower leg: No edema.     Left lower leg: No edema.  Lymphadenopathy:     Cervical: No cervical adenopathy.  Skin:    General: Skin is warm and dry.     Findings: No rash.  Neurological:     Mental Status: She is alert and oriented to person, place, and time. Mental status is at baseline.  Psychiatric:        Mood and Affect: Mood normal.        Behavior: Behavior normal.  Results for orders placed or performed in visit on 11/11/23  POCT Urinalysis Dipstick  Result Value Ref Range   Color, UA Amber    Clarity, UA Clear    Glucose, UA Negative Negative   Bilirubin, UA Negative    Ketones, UA Negative    Spec Grav, UA 1.015 1.010 - 1.025   Blood, UA Small (A)    pH, UA 6.0 5.0 - 8.0   Protein, UA Positive (A) Negative   Urobilinogen, UA 0.2 0.2 or 1.0 E.U./dL   Nitrite, UA Negative    Leukocytes, UA Negative Negative   Appearance     Odor      Assessment & Plan     Problem List Items Addressed This Visit   None Visit Diagnoses       Dysuria    -  Primary   Relevant Orders   POCT Urinalysis Dipstick (Completed)   Urine Culture     Generalized abdominal pain         Hematuria, unspecified  type       Relevant Orders   Urinalysis, microscopic only           Abdominal pain due to antibiotic use Abdominal pain developed after starting Bactrim  for a presumed UTI, with symptoms of discomfort and nausea exacerbated by food intake. She has colon surgeries and an incisional hernia, which may contribute to discomfort. Pain improved after discontinuing the antibiotic, suggesting it was the cause. No changes in bowel habits and normal bowel sounds were noted. - Continue probiotic use - Encourage hydration to alleviate symptoms - Use Bentyl  (dicyclomine ) as needed for abdominal cramping and pain  Incisional hernia Known incisional hernia from previous colon surgeries, located along the incision line, can become inflamed and contribute to abdominal discomfort. No acute changes or complications were noted.  Urinary symptoms without evidence of infection Urinary symptoms included dark urine and increased frequency, initially thought to be a UTI. Urine culture showed mixed serogenital flora with no significant bacterial growth, indicating no true infection. Current urinalysis shows no signs of infection. Symptoms may be due to dehydration, leading to concentrated urine and bladder irritation. - Encourage increased fluid intake to dilute urine and reduce bladder irritation - Order urine culture and microscopy to confirm absence of infection       No orders of the defined types were placed in this encounter.    Return if symptoms worsen or fail to improve.      Jon Eva, MD  [facility] [phone] (phone) [phone] (fax)  [facility]

## 2023-11-12 LAB — URINALYSIS, MICROSCOPIC ONLY
Bacteria, UA: NONE SEEN
Casts: NONE SEEN /LPF
WBC, UA: NONE SEEN /HPF (ref 0–5)

## 2023-11-13 ENCOUNTER — Ambulatory Visit: Payer: Self-pay | Admitting: Family Medicine

## 2023-11-13 LAB — URINE CULTURE: Organism ID, Bacteria: NO GROWTH

## 2023-12-19 ENCOUNTER — Ambulatory Visit: Payer: Self-pay

## 2023-12-19 NOTE — Telephone Encounter (Signed)
 FYI Only or Action Required?: Action required by provider: clinical question for provider.  Patient was last seen in primary care on 11/11/2023 by Myrla Jon HERO, MD.  Called Nurse Triage reporting Vaginal Pain.  Symptoms began a week ago.  Interventions attempted: OTC medications: states she tried OTC vaginal creams years ago, could not recall names.  Symptoms are: gradually worsening.  Triage Disposition: See PCP Within 2 Weeks  Patient/caregiver understands and will follow disposition?: No, refuses disposition                           Copied from CRM #[id number]. Topic: Clinical - Medication Question >> Dec 19, 2023  7:51 AM Ahlexyia S wrote: Reason for CRM: Pt called in wanting to know if she can be prescribed Estradiol vaginal cream for vaginal dryness. Pt stated she isnt experiencing any other symptoms at this time. Informed pt that I will send message and someone will call her if need be.  Reason for Disposition  All other vaginal symptoms  (Exceptions: Feels like prior yeast infection, minor abrasion, mild rash < 24 hour duration, mild itching, vaginal dryness during sex.)  Answer Assessment - Initial Assessment Questions Patient was originally seeking provider to call in medication for vaginal dryness. This RN advised in-person evaluation, due to worsening symptoms and development of pain. Patient declined and stated she does not feel like coming into the office. Patient is requesting provider to prescribe Estradiol vaginal cream. Please advise.   1. SYMPTOM: What's the main symptom you're concerned about? (e.g., pain, itching, dryness)     Vaginal dryness, dryness is starting to cause pain 2. LOCATION: Where are the symptoms located? (e.g., inside/outside, left/right)     Patient unable to describe  3. ONSET: When did the dryness and pain start?     Dryness for awhile, pain started a week ago  4. PAIN: Is there any pain? If Yes, ask:  How bad is it? (Scale: 1-10; mild, moderate, severe)     Mild, describes pain as discomfort 5. ITCHING: Is there any itching? If Yes, ask: How bad is it? (Scale: 1-10; mild, moderate, severe)     Denies 6. CAUSE: What do you think is causing the discharge? Have you had the same problem before? What happened then?     Aging 7. OTHER SYMPTOMS: Do you have any other symptoms? (e.g., fever, itching, vaginal bleeding, pain with urination, injury to genital area, vaginal foreign body)     Denies bleeding, denies painful urination/urinary symptoms, denies abnormal smells/discharge  8. PREGNANCY: Is there any chance you are pregnant? When was your last menstrual period?     N/A  Protocols used: Vaginal Symptoms-A-AH

## 2023-12-22 ENCOUNTER — Other Ambulatory Visit: Payer: Self-pay | Admitting: Family Medicine

## 2023-12-22 MED ORDER — ESTRADIOL 0.01 % VA CREA: 1.0000 | TOPICAL_CREAM | Freq: Every day | VAGINAL | 4 refills | Status: AC

## 2023-12-23 ENCOUNTER — Other Ambulatory Visit: Payer: Self-pay | Admitting: Family Medicine

## 2023-12-23 DIAGNOSIS — F419 Anxiety disorder, unspecified: Secondary | ICD-10-CM

## 2024-01-06 ENCOUNTER — Encounter: Payer: Self-pay | Admitting: Physician Assistant

## 2024-01-06 ENCOUNTER — Ambulatory Visit: Admitting: Physician Assistant

## 2024-01-06 ENCOUNTER — Ambulatory Visit: Payer: Self-pay

## 2024-01-06 VITALS — BP 135/82 | HR 77 | Ht 63.0 in | Wt 183.3 lb

## 2024-01-06 DIAGNOSIS — F419 Anxiety disorder, unspecified: Secondary | ICD-10-CM | POA: Diagnosis not present

## 2024-01-06 DIAGNOSIS — F410 Panic disorder [episodic paroxysmal anxiety] without agoraphobia: Secondary | ICD-10-CM

## 2024-01-06 DIAGNOSIS — F329 Major depressive disorder, single episode, unspecified: Secondary | ICD-10-CM

## 2024-01-06 DIAGNOSIS — J3089 Other allergic rhinitis: Secondary | ICD-10-CM

## 2024-01-06 DIAGNOSIS — M545 Low back pain, unspecified: Secondary | ICD-10-CM

## 2024-01-06 DIAGNOSIS — F39 Unspecified mood [affective] disorder: Secondary | ICD-10-CM

## 2024-01-06 DIAGNOSIS — R300 Dysuria: Secondary | ICD-10-CM | POA: Diagnosis not present

## 2024-01-06 LAB — POCT URINALYSIS DIPSTICK
Bilirubin, UA: NEGATIVE
Blood, UA: NEGATIVE
Glucose, UA: NEGATIVE
Ketones, UA: NEGATIVE
Leukocytes, UA: NEGATIVE
Nitrite, UA: NEGATIVE
Protein, UA: NEGATIVE
Spec Grav, UA: <=1.005 — AB (ref 1.010–1.025)
Urobilinogen, UA: 0.2 U/dL
pH, UA: 7 (ref 5.0–8.0)

## 2024-01-06 MED ORDER — FLUTICASONE PROPIONATE 50 MCG/ACT NA SUSP: 2.0000 | Freq: Every day | NASAL | 6 refills | Status: AC

## 2024-01-06 NOTE — Progress Notes (Addendum)
 " Established patient visit  Patient: [name]   DOB: 1945-06-18   78 y.o. Female  MRN: [id number] Visit Date: 01/06/2024  Today's healthcare provider: [name], PA-C   Chief Complaint  Patient presents with   Back Pain    L lower back pain, vaginal irritation, fever(101 at home), burning with urination ongoing 4 days Pt was seen 9/5 and 9/23 same issue   Subjective     HPI     Back Pain    Additional comments: L lower back pain, vaginal irritation, fever(101 at home), burning with urination ongoing 4 days Pt was seen 9/5 and 9/23 same issue      Last edited by Wilfred Hargis RAMAN, CMA on 01/06/2024 11:28 AM.       Discussed the use of AI scribe software for clinical note transcription with the patient, who gave verbal consent to proceed.  History of Present Illness [name] is a 78 year old female who presents with lower back pain, fever, and burning sensation during urination.  She has experienced lower back pain, primarily on the left side, with a severity of 6 out of 10, for the past four days. There is no urinary incontinence. She also reports a burning sensation during urination and vaginal irritation. Fever was present yesterday but not today. She feels fatigued and has post-nasal drainage. Similar episodes occurred twice in September.       10/24/2023    3:13 PM 06/24/2023    1:17 PM 06/23/2023   10:10 AM  Depression screen PHQ 2/9  Decreased Interest 0 2 3  Down, Depressed, Hopeless 3 2 3   PHQ - 2 Score 3 4 6   Altered sleeping 0 0 0  Tired, decreased energy 3 2 3   Change in appetite 1 0 0  Feeling bad or failure about yourself  3 0 3  Trouble concentrating 1 0 1  Moving slowly or fidgety/restless 0 0 0  Suicidal thoughts 0 0 1  PHQ-9 Score 11  6  14    Difficult doing work/chores Somewhat difficult Not difficult at all Somewhat difficult     Data saved with a previous flowsheet row definition      10/24/2023    3:13 PM 06/23/2023   10:10 AM  12/20/2022    2:30 PM 10/11/2022    1:42 PM  GAD 7 : Generalized Anxiety Score  Nervous, Anxious, on Edge 3 3 2 3   Control/stop worrying 3 3 2 3   Worry too much - different things 3 3 2 3   Trouble relaxing 3 3 1 2   Restless 2 3 1  0  Easily annoyed or irritable 3 3 3 3   Afraid - awful might happen 3 3 2 3   Total GAD 7 Score 20 21 13 17   Anxiety Difficulty Very difficult Very difficult Not difficult at all     Medications: Outpatient Medications Prior to Visit  Medication Sig   albuterol  (VENTOLIN  HFA) 108 (90 Base) MCG/ACT inhaler TAKE 2 PUFFS BY MOUTH EVERY 6 HOURS AS NEEDED FOR WHEEZE OR SHORTNESS OF BREATH   ALPRAZolam  (XANAX ) 1 MG tablet TAKE 1/2 TO 1 (ONE-HALF TO ONE) TABLET BY MOUTH THREE TIMES DAILY AS NEEDED FOR ANXIETY   aspirin  EC 81 MG tablet Take 81 mg by mouth daily.   clotrimazole  (LOTRIMIN ) 1 % cream Apply 1 application topically 2 (two) times daily.   dicyclomine  (BENTYL ) 10 MG capsule TAKE 1 CAPSULE BY MOUTH THREE TIMES DAILY BEFORE MEAL(S) AS NEEDED FOR STOMACH  PAIN   escitalopram  (LEXAPRO ) 10 MG tablet Take 1 tablet (10 mg total) by mouth daily.   estradiol  (ESTRACE ) 0.01 % CREA vaginal cream Place 1 Applicatorful vaginally at bedtime.   ezetimibe  (ZETIA ) 10 MG tablet Take 1 tablet (10 mg total) by mouth daily.   fluticasone  (FLONASE ) 50 MCG/ACT nasal spray PLACE 1-2 SPRAYS INTO BOTH NOSTRILS DAILY AS NEEDED FOR ALLERGIES OR RHINITIS.   ibuprofen  (ADVIL ) 800 MG tablet Take 1 tablet (800 mg total) by mouth daily as needed (severe pain).   omeprazole  (PRILOSEC) 40 MG capsule Take 1 capsule (40 mg total) by mouth daily.   ondansetron  (ZOFRAN -ODT) 4 MG disintegrating tablet DISSOLVE 1 TABLET IN MOUTH EVERY 8 HOURS AS NEEDED FOR NAUSEA OR VOMITING   promethazine  (PHENERGAN ) 12.5 MG tablet TAKE 1 TO 2 TABLETS BY MOUTH EVERY 8 HOURS AS NEEDED FOR NAUSEA FOR VOMITING   rosuvastatin  (CRESTOR ) 20 MG tablet Take 1 tablet (20 mg total) by mouth daily.   Spacer/Aero-Holding  Chambers (EASIVENT) inhaler Use as needed with inhaler   Spacer/Aero-Holding Chambers (EQ SPACE CHAMBER ANTI-STATIC) DEVI Inhale into the lungs as needed.   [DISCONTINUED] verapamil  (CALAN -SR) 120 MG CR tablet Take 1 tablet (120 mg total) by mouth at bedtime.   No facility-administered medications prior to visit.    Review of Systems All negative Except see HPI       Objective    BP 135/82   Pulse 77   Ht 5' 3 (1.6 m)   Wt 183 lb 4.8 oz (83.1 kg)   SpO2 99%   BMI 32.47 kg/m     Physical Exam Vitals reviewed.  Constitutional:      General: She is not in acute distress.    Appearance: Normal appearance. She is well-developed. She is not diaphoretic.  HENT:     Head: Normocephalic and atraumatic.  Eyes:     General: No scleral icterus.    Conjunctiva/sclera: Conjunctivae normal.  Neck:     Thyroid : No thyromegaly.  Cardiovascular:     Rate and Rhythm: Normal rate and regular rhythm.     Pulses: Normal pulses.     Heart sounds: Normal heart sounds. No murmur heard. Pulmonary:     Effort: Pulmonary effort is normal. No respiratory distress.     Breath sounds: Normal breath sounds. No wheezing, rhonchi or rales.  Musculoskeletal:        General: Tenderness present.     Cervical back: Neck supple.     Right lower leg: No edema.     Left lower leg: No edema.  Lymphadenopathy:     Cervical: No cervical adenopathy.  Skin:    General: Skin is warm and dry.     Findings: No rash.  Neurological:     Mental Status: She is alert and oriented to person, place, and time. Mental status is at baseline.  Psychiatric:        Mood and Affect: Mood normal.        Behavior: Behavior normal.      No results found for any visits on 01/06/24.      Assessment & Plan Urinary tract infection symptoms Symptoms suggest UTI or kidney issues. Rapid test negative; urine culture pending. Fever etiology uncertain. - Ordered urine culture. - Advised increased water  intake. -  Recommended cranberry juice. - Suggested topical analgesics for back pain. - Advised acetaminophen  for pain management.  Acute sinusitis symptoms Symptoms could be related to sinusitis, allergy, or viral illness. Fever may be related.  No antihistamines used. Initial symptomatic treatment preferred. - Prescribed Flonase . - Advised symptomatic treatment before antibiotics. - Instructed to monitor for persistent fever.  Dysuria (Primary)  - POCT Urinalysis Dipstick - Urine Culture - Urine Microscopic  Anxiety Chronic and stable Currently on lexapro  and xanax . Expressed her desire to taper off xanax  Advised to proceed with BH - Amb ref to Integrated Behavioral Health  Major depressive disorder with single episode, remission status unspecified - Amb ref to Integrated Behavioral [facility]  Panic attacks Advised a therapy sessions - Amb ref to [facility] Collaboration of Care: Medication Management AEB  , Primary Care Provider AEB  , Psychiatrist AEB  , and Referral or follow-up with counselor/therapist AEB    Patient/Guardian was advised Release of Information must be obtained prior to any record release in order to collaborate their care with an outside provider. Patient/Guardian was advised if they have not already done so to contact the registration department to sign all necessary forms in order for us  to release information regarding their care.   Consent: Patient and/or legal guardian verbally consented to [facility] services about presenting concerns and psychiatric consultation as appropriate. The services will be billed as appropriate for the patient     Mood disorder  - Amb ref to Integrated Behavioral Health   Non-seasonal allergic rhinitis due to other allergic trigger  - fluticasone  (FLONASE ) 50 MCG/ACT nasal spray; Place 2 sprays into both nostrils daily.  Dispense: 16 g; Refill: 6  Lower back pain Acute Advised symptomatic  treatment with heat/ice, lidocaine  patches OTC, voltaren gel Will follow-up  Orders Placed This Encounter  Procedures   POCT Urinalysis Dipstick    No follow-ups on file.   The patient was advised to call back or seek an in-person evaluation if the symptoms worsen or if the condition fails to improve as anticipated.  I discussed the assessment and treatment plan with the patient. The patient was provided an opportunity to ask questions and all were answered. The patient agreed with the plan and demonstrated an understanding of the instructions.  I, Sylus Stgermain, PA-C have reviewed all documentation for this visit. The documentation on 01/06/2024  for the exam, diagnosis, procedures, and orders are all accurate and complete.  [name], [facility], MMS [facility] [phone] (phone) [phone] (fax)  [facility] "

## 2024-01-06 NOTE — Telephone Encounter (Signed)
 FYI Only or Action Required?: FYI only for provider: appointment scheduled on 01/06/2024.  Patient was last seen in primary care on 11/11/2023 by Myrla Jon HERO, MD.  Called Nurse Triage reporting Dysuria and back pain, possible fever.  Symptoms began several days ago.  Interventions attempted: Nothing.  Symptoms are: gradually worsening.  Triage Disposition: See HCP Within 4 Hours (Or PCP Triage)  Patient/caregiver understands and will follow disposition?: Yes                    Copied from CRM #[id number]. Topic: Clinical - Red Word Triage >> Jan 06, 2024  8:02 AM Darshell M wrote: Red Word that prompted transfer to Nurse Triage: Pain Iower back, vaginal irritation. Fever, burning with urination. Believes it is a UTI. CB# [phone] Reason for Disposition  Side (flank) or lower back pain present  Answer Assessment - Initial Assessment Questions 1. SEVERITY: How bad is the pain?  (e.g., Scale 1-10; mild, moderate, or severe)     high 2. FREQUENCY: How many times have you had painful urination today?      Each time 3. PATTERN: Is pain present every time you urinate or just sometimes?      yes 4. ONSET: When did the painful urination start?      3-4 5. FEVER: Do you have a fever? If Yes, ask: What is your temperature, how was it measured, and when did it start?     Normal now - fever develops later in the day 6. PAST UTI: Have you had a urine infection before? If Yes, ask: When was the last time? and What happened that time?      yes 7. CAUSE: What do you think is causing the painful urination?  (e.g., UTI, scratch, Herpes sore)     UTI 8. OTHER SYMPTOMS: Do you have any other symptoms? (e.g., blood in urine, flank pain, genital sores, urgency, vaginal discharge)     Back pain, vaginal irritation  Protocols used: Urination Pain - Female-A-AH

## 2024-01-07 LAB — URINALYSIS, MICROSCOPIC ONLY
Bacteria, UA: NONE SEEN
Casts: NONE SEEN /LPF
Epithelial Cells (non renal): NONE SEEN /HPF (ref 0–10)
RBC, Urine: NONE SEEN /HPF (ref 0–2)
WBC, UA: NONE SEEN /HPF (ref 0–5)

## 2024-01-09 ENCOUNTER — Ambulatory Visit: Payer: Self-pay | Admitting: Physician Assistant

## 2024-01-09 LAB — URINE CULTURE

## 2024-01-11 ENCOUNTER — Other Ambulatory Visit: Payer: Self-pay | Admitting: Family Medicine

## 2024-01-11 DIAGNOSIS — K219 Gastro-esophageal reflux disease without esophagitis: Secondary | ICD-10-CM

## 2024-01-11 DIAGNOSIS — R051 Acute cough: Secondary | ICD-10-CM

## 2024-01-11 DIAGNOSIS — J329 Chronic sinusitis, unspecified: Secondary | ICD-10-CM

## 2024-01-11 DIAGNOSIS — J301 Allergic rhinitis due to pollen: Secondary | ICD-10-CM

## 2024-02-16 ENCOUNTER — Ambulatory Visit: Admitting: Licensed Clinical Social Worker

## 2024-02-16 DIAGNOSIS — F331 Major depressive disorder, recurrent, moderate: Secondary | ICD-10-CM

## 2024-02-16 DIAGNOSIS — F411 Generalized anxiety disorder: Secondary | ICD-10-CM
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
## 2024-02-17 NOTE — Patient Instructions (Signed)
  ANXIETY/PANIC EPISODE MANAGEMENT (CBT/MINDFULNESS BASED)   If you are in a highly stimulating or triggering environment, change your location to a less stimulating or safer environment .   Stimulate your senses by tasting/eating a sour candy (such as a lemon drop) or a strong flavored cough drop.  This triggers smell, taste, and touch since we  have had lots of nerve endings inside of your mouth.   Practice slow, controlled breathing to avoid hyperventilation.  Breathing into your nose for the count of 4 inside your head, holding your breath for the count of 4 inside your head, and exhale slowly counting to 8 inside your head.  This is called 4-4-8 breathing, or triangle breathing. (Controlled breathing). This helps manage panic, anxiety, anger, and tearfulness.  Additional grounding exercises include rubbing your hands softly together, or wiggling toes inside your shoes, pretending to grasp the floor with your toes.    Listen to calming music or sounds  Count backwards in your head by twos or tens or recite multiplication tables in your head.  Visualize a calming, happy place and identify 5 explicit details about this place (color, temperature, smells, visual details, etc.)  Splash cold water on your face or hold an ice cube in your hand (alternate hands)  Clench and release muscle groups (hands, shoulders, facial muscles)  Engage in soothing activities to recover after a stressful/anxious episode such as: drinking a warm beverage, sitting in your favorite comfortable location, positive physical contact with a pet or weighted blanket, using positive self talk/positive affirmations.   Download PTSD Coach app for your phone or tablet--its free and has lots of tips to help you manage panic episodes no matter where you are!    Emergency Resources:  National Suicide & Crisis Lifeline: Call or text 988  Crisis Text Line: Text HOME to 618 747 1853  [facility]  [address], [place], [place] [address] [phone] or [phone] [facility] 24/7 FOR ANYONE [address], [place], [place]  [phone] Fax: [phone] [email or web address] *Interpreters available *Accepts all insurance and uninsured for Urgent Care needs *Accepts Medicaid and uninsured for outpatient treatment (below)

## 2024-02-18 ENCOUNTER — Telehealth (INDEPENDENT_AMBULATORY_CARE_PROVIDER_SITE_OTHER): Payer: Self-pay | Admitting: Licensed Clinical Social Worker

## 2024-02-18 DIAGNOSIS — F411 Generalized anxiety disorder: Secondary | ICD-10-CM

## 2024-02-18 DIAGNOSIS — F331 Major depressive disorder, recurrent, moderate: Secondary | ICD-10-CM

## 2024-02-18 NOTE — BH Specialist Note (Addendum)
 Attestation signed by Warren Becker, PMHNP, DNP 02/18/2024 7:35 AM   Collaborative Care Psychiatric Consultant Case Review   Assessment/Provisional Diagnosis 78 year old female with history of hyperlipidemia, SVT, prediabetes, HTN, obesity, and diverticulosis. The patient is referred for anxiety and depression.   Provisional Diagnosis: # MDD, Recurrent, moderately severe # GAD   Recommendation 1. Recommend Lexapro  10 mg daily increase to 20 mg daily 2. Recommend reducing Xanax  1 mg TID to Xanax  1 mg BID and Xanax  0.5 mg daily, reduce Xanax  every 2 weeks by 0.5 mg.  The patient desires to taper off of Xanax  and has had difficulty in the past discontinuing it and ending up in the hospital.  Provide instructions every two weeks of the 0.5 change versus all the directions for the taper at once to prevent confusion.  Please slow the taper as needed.   3. Consider a vitamin D  supplement based on 2023 levels, repeat lab recommended 4. BH specialist to follow up.   Thank you for your consult. Please contact our collaborative care team for any questions or concerns.    The above treatment considerations and suggestions are based on consultation with the [facility] specialist and/or PCP and a review of information available in the shared registry and the patient's Electronic Health Record (EHR). I have not personally examined the patient. All recommendations should be implemented with consideration of the patient's relevant prior history and current clinical status. Please feel free to call me with any questions about the care of this patient.   Virtual Behavioral Health Treatment Plan Team Note  MRN: [id number] NAME: [name]  DATE: 02/18/2024  Start time: Start Time: 0920 End time: Stop Time: 0940 Total time: Total Time in Minutes (Visit): 20  Total number of Virtual BH Treatment Team Plan encounters: 1/4  Treatment Team Attendees: Tawni Brisker, LCSW and Sharlot Becker, DNP   Diagnoses:     ICD-10-CM   1. MDD (major depressive disorder), recurrent episode, moderate (HCC)  F33.1     2. Generalized anxiety disorder  F41.1       Goals, Interventions and Follow-up Plan Goals: Decrease benzodiazepine use for managing symptoms of anxiety  Interventions: Motivational Interviewing Medication Monitoring  Medication Management Recommendations:   1. Recommend Lexapro  10 mg daily increase to 20 mg daily 2. Recommend reducing Xanax  1 mg TID to Xanax  1 mg BID and Xanax  0.5 mg daily, reduce Xanax  every 2 weeks by 0.5 mg.  The patient desires to taper off of Xanax  and has had difficulty in the past discontinuing it and ending up in the hospital.  Provide instructions every two weeks of the 0.5 change versus all the directions for the taper at once to prevent confusion.  Please slow the taper as needed.   3. Consider a vitamin D  supplement based on 2023 levels, repeat lab recommended 4. BH specialist to follow up.   Thank you for your consult. Please contact our collaborative care team for any questions or concerns.     The above treatment considerations and suggestions are based on consultation with the [facility] specialist and/or PCP and a review of information available in the shared registry and the patient's Electronic Health Record (EHR). I have not personally examined the patient. All recommendations should be implemented with consideration of the patient's relevant prior history and current clinical status. Please feel free to call me with any questions about the care of this patient.    Follow-up Plan: IBH COLLABORATIVE CARE TEAM consultation and development  of treatment plan  History of the present illness Presenting Problem/Current Symptoms: [name] is a 78 y.o. adult patient with history of depression and anxiety seen in consultation at the request of Janna Ostwalt PA C for establishment of [facility] Collaborative Care management. Pt is currently taking the following psychiatric  medications: Alprazolam  1mg  TID, escitalopram  10 mg . Current symptoms include: feeling down/depressed, low energy, feeling bad about self, trouble focusing, passive suicidal ideation, feeling nervous/anxious/on edge, worrying, irritability, feeling fearful as if something could happen.  Pt reports passive suicidal ideation with no plans or intent to follow through. Pt reports no HI, or AVH at time of session. Pt denies substance use.   History of present illness: [name] reports that they have a history of depression and anxiety since early adulthood and have had the following treatments: inpatient hospitalizations (pt reports multiple hospitalizations), medication managed by PCP, and counseling therapy through her church.  Pt reports concerns about medical history including hypertension, GI issues, recent stroke. Pt reports that current external stressors include difficult relationship with her husband, worries about current overall health/wellness. Pt is concerned about having to take xanax  daily and wants to come off the medication.[name] reports that her husband and children (and several Godchildren) are primary support system at time of assessment.    Psychiatric History    Have you ever been treated for a mental health problem? Yes If Yes, when were you treated and whom did you see (psychiatrist/counselor) ?        When: pt reports that she was admitted to [facility] for depression over the summer              Name of provider: [facility], [facility] ED    Depression: Yes Anxiety: Yes Mania: No Psychosis: No PTSD symptoms: Yes   Past Psychiatric History/Hospitalization(s): Hospitalization for psychiatric illness: Yes Prior Suicide Attempts: No Prior Self-injurious behavior: No   Have you ever had thoughts of harming yourself or others or attempted suicide? No plan to harm self or others  Psychosocial stressors Flowsheet Row Integrated Behavioral Health from  02/16/2024 in [facility]  Current Stressors Family conflict, Grief/losses, Peer relationships, Surgery/procedure  Familial Stressors None  Sleep Decreased  [pt takes xanax  nightly for sleep]  Appetite No problems  Coping ability Overwhelmed  Patient taking medications as prescribed Yes    Self-harm Behaviors Risk Assessment Flowsheet Row Integrated Behavioral Health from 02/16/2024 in [facility]  Self-harm risk factors Family or marital conflict  Have you recently had any thoughts about harming yourself? No    Screenings PHQ-9 Assessments:     02/16/2024   12:03 PM 10/24/2023    3:13 PM 06/24/2023    1:17 PM  Depression screen PHQ 2/9  Decreased Interest 2 0 2  Down, Depressed, Hopeless 2 3 2   PHQ - 2 Score 4 3 4   Altered sleeping 0 0 0  Tired, decreased energy 3 3 2   Change in appetite 2 1 0  Feeling bad or failure about yourself  3 3 0  Trouble concentrating 2 1 0  Moving slowly or fidgety/restless 0 0 0  Suicidal thoughts 2 0 0  PHQ-9 Score 16 11  6    Difficult doing work/chores Very difficult Somewhat difficult Not difficult at all     Data saved with a previous flowsheet row definition   GAD-7 Assessments:     02/16/2024  12:04 PM 10/24/2023    3:13 PM 06/23/2023   10:10 AM 12/20/2022    2:30 PM  GAD 7 : Generalized Anxiety Score  Nervous, Anxious, on Edge 3 3 3 2   Control/stop worrying 3 3 3 2   Worry too much - different things 3 3 3 2   Trouble relaxing 3 3 3 1   Restless 2 2 3 1   Easily annoyed or irritable 3 3 3 3   Afraid - awful might happen 3 3 3 2   Total GAD 7 Score 20 20 21 13   Anxiety Difficulty Very difficult Very difficult Very difficult Not difficult at all    Past Medical History Past Medical History:  Diagnosis Date   Abdominal hernia    Anxiety    Arthritis    lower spine   Back pain    lower back - S/P lifting injury   Closed fracture of lateral malleolus of right fibula 10/11/2020    Complication of anesthesia    makes hair brittle   COVID-19 09/20/2022   GERD (gastroesophageal reflux disease)    Hyperlipidemia    Hypertension    Neuromuscular disorder (HCC)    numbness legs and feet s/p lower back injury   Partial bowel obstruction (HCC) 02/04/2018   Partial small bowel obstruction (HCC) 02/04/2018   SBO (small bowel obstruction) (HCC) 03/13/2017   Stroke (HCC)    mini - strokes 2009 - no deficit   Vertigo    none recently   Vitamin D  deficiency    Wears contact lenses     Vital signs: There were no vitals filed for this visit.  Allergies:  Allergies as of 02/18/2024 - Review Complete 01/06/2024  Allergen Reaction Noted   Levofloxacin  Swelling 12/19/2016   Calcium  channel blockers  08/01/2014   Amoxicillin  Other (See Comments) 08/01/2014   Nickel Rash 09/16/2014   Penicillins Other (See Comments) 08/01/2014    Medication History Current medications:  Outpatient Encounter Medications as of 02/18/2024  Medication Sig   albuterol  (VENTOLIN  HFA) 108 (90 Base) MCG/ACT inhaler TAKE 2 PUFFS BY MOUTH EVERY 6 HOURS AS NEEDED FOR WHEEZE OR SHORTNESS OF BREATH   ALPRAZolam  (XANAX ) 1 MG tablet TAKE 1/2 TO 1 (ONE-HALF TO ONE) TABLET BY MOUTH THREE TIMES DAILY AS NEEDED FOR ANXIETY   aspirin  EC 81 MG tablet Take 81 mg by mouth daily.   clotrimazole  (LOTRIMIN ) 1 % cream Apply 1 application topically 2 (two) times daily.   dicyclomine  (BENTYL ) 10 MG capsule TAKE 1 CAPSULE BY MOUTH THREE TIMES DAILY BEFORE MEAL(S) AS NEEDED FOR STOMACH PAIN   escitalopram  (LEXAPRO ) 10 MG tablet Take 1 tablet (10 mg total) by mouth daily.   estradiol  (ESTRACE ) 0.01 % CREA vaginal cream Place 1 Applicatorful vaginally at bedtime.   ezetimibe  (ZETIA ) 10 MG tablet Take 1 tablet (10 mg total) by mouth daily.   fluticasone  (FLONASE ) 50 MCG/ACT nasal spray PLACE 1-2 SPRAYS INTO BOTH NOSTRILS DAILY AS NEEDED FOR ALLERGIES OR RHINITIS.   fluticasone  (FLONASE ) 50 MCG/ACT nasal spray Place 2  sprays into both nostrils daily.   ibuprofen  (ADVIL ) 800 MG tablet Take 1 tablet (800 mg total) by mouth daily as needed (severe pain).   omeprazole  (PRILOSEC) 40 MG capsule Take 1 capsule by mouth once daily   ondansetron  (ZOFRAN -ODT) 4 MG disintegrating tablet DISSOLVE 1 TABLET IN MOUTH EVERY 8 HOURS AS NEEDED FOR NAUSEA OR VOMITING   promethazine  (PHENERGAN ) 12.5 MG tablet TAKE 1 TO 2 TABLETS BY MOUTH EVERY 8 HOURS AS NEEDED FOR  NAUSEA FOR VOMITING   rosuvastatin  (CRESTOR ) 20 MG tablet Take 1 tablet (20 mg total) by mouth daily.   Spacer/Aero-Holding Chambers (EASIVENT) inhaler Use as needed with inhaler   Spacer/Aero-Holding Chambers (EQ SPACE CHAMBER ANTI-STATIC) DEVI Inhale into the lungs as needed.   No facility-administered encounter medications on file as of 02/18/2024.     Scribe for Treatment Team: Waldine Zenz R Reanna Scoggin, LCSW

## 2024-02-21 ENCOUNTER — Other Ambulatory Visit: Payer: Self-pay | Admitting: Family Medicine

## 2024-02-21 DIAGNOSIS — E785 Hyperlipidemia, unspecified: Secondary | ICD-10-CM

## 2024-02-27 ENCOUNTER — Ambulatory Visit: Admitting: Licensed Clinical Social Worker

## 2024-02-27 DIAGNOSIS — F331 Major depressive disorder, recurrent, moderate: Secondary | ICD-10-CM

## 2024-02-27 DIAGNOSIS — F411 Generalized anxiety disorder: Secondary | ICD-10-CM

## 2024-02-27 NOTE — BH Specialist Note (Signed)
 Virtual Visit via Video Note  I connected with [name] on 02/27/2024 at  3:30 PM EST by a video enabled telemedicine application and verified that I am speaking with the correct person using two identifiers.  Location: Patient: Primary residence, [place] Provider: Clinician virtual office, [place]   I discussed the limitations of evaluation and management by telemedicine and the availability of in person appointments. The patient expressed understanding and agreed to proceed.   I discussed the assessment and treatment plan with the patient. The patient was provided an opportunity to ask questions and all were answered. The patient agreed with the plan and demonstrated an understanding of the instructions.   Integrated Behavioral Health Follow Up Visit  MRN: [id number] Name: [name]  Number of Integrated Behavioral Health Clinician visits: 3- Third Visit  Session Start time: 1530   Session End time: 1550  Total time in minutes: 20  Encounter Diagnoses  Name Primary?   MDD (major depressive disorder), recurrent episode, moderate (HCC) Yes   Generalized anxiety disorder      Types of Service: Telephone visit and Collaborative care   Subjective: [name] is a 79 y.o. adult patient with history of depression and anxiety seen in consultation at the request of [name] PA C for establishment of [facility] Collaborative Care management. Pt is currently taking the following psychiatric medications: Alprazolam  1mg  TID, escitalopram  10 mg .  Patient reports that she has not been taking the escitalopram , and has not for quite some time.  Patient reports the following symptoms/concerns: Patient reports she has occasional symptoms of depression including low energy, feeling sad after the loss of several individuals close to her.  Patient reports she still experiences anxiety, worries on a regular basis.  Patient reports her primary concern at time of session is decreasing overall  benzodiazepine use.  Duration of problem: Ongoing; Severity of problem: mild  Objective: Mood: Anxious and Affect: Appropriate Risk of harm to self or others: No plan to harm self or others  Life Context: Family and Social: Patient reports that she has several good friends that her support system for her.  Patient denies that there are any family conflicts or concerns at time of session.  School/Work: N/A  Self-Care: Patient admits that she needs to do a better job of focusing on herself and self-care.  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  Life Changes: Patient reports that she has had several significant losses recently--3 individuals that were close with her family.  Patient and/or Family's Strengths/Protective Factors: Social connections, Social and Patent attorney, [place]crete supports in place (healthy food, safe environments, etc.), and Physical Health (exercise, healthy diet, medication compliance, etc.)  Goals Addressed: Patient will:  Reduce symptoms of: anxiety   Increase knowledge and/or ability of: coping skills, healthy habits, self-management skills, and stress reduction   Demonstrate ability to: Increase healthy adjustment to current life circumstances and decrease benzodiazepine use  Progress towards Goals: Ongoing  Interventions: Interventions utilized:  Motivational Interviewing, Medication Monitoring, and Supportive Counseling Standardized Assessments completed: Not Needed      02/16/2024   12:03 PM 10/24/2023    3:13 PM 06/24/2023    1:17 PM  Depression screen PHQ 2/9  Decreased Interest 2 0 2  Down, Depressed, Hopeless 2 3 2   PHQ - 2 Score 4 3 4   Altered sleeping 0 0 0  Tired, decreased energy 3 3 2  Change in appetite 2 1 0  Feeling bad or failure about yourself  3 3 0  Trouble concentrating 2 1 0  Moving slowly or fidgety/restless 0 0 0  Suicidal thoughts 2 0 0  PHQ-9  Score 16 11  6    Difficult doing work/chores Very difficult Somewhat difficult Not difficult at all     Data saved with a previous flowsheet row definition        02/16/2024   12:04 PM 10/24/2023    3:13 PM 06/23/2023   10:10 AM 12/20/2022    2:30 PM  GAD 7 : Generalized Anxiety Score  Nervous, Anxious, on Edge 3 3 3 2   Control/stop worrying 3 3 3 2   Worry too much - different things 3 3 3 2   Trouble relaxing 3 3 3 1   Restless 2 2 3 1   Easily annoyed or irritable 3 3 3 3   Afraid - awful might happen 3 3 3 2   Total GAD 7 Score 20 20 21 13   Anxiety Difficulty Very difficult Very difficult Very difficult Not difficult at all     [facility] from 02/16/2024 in [facility] UC from 07/16/2023 in [facility] at [facility] Visit from 06/23/2023 in [facility]  C-SSRS RISK CATEGORY High Risk No Risk Error: Q6 is Yes, you must answer 7    Patient and/or Family Response: Patient reports that her primary concern is decreasing overall benzodiazepine use.  Patient Centered Plan: Patient is on the following Treatment Plan(s):  The recommendation was to increase Lexapro  from 10 mg to 20 mg--patient states that she has not been taking Lexapro  at all, [facility] clinician will consult with psychiatric provider to determine next steps. 2. Recommend reducing Xanax  1 mg TID to Xanax  1 mg BID and Xanax  0.5 mg daily, reduce Xanax  every 2 weeks by 0.5 mg.  The patient desires to taper off of Xanax  and has had difficulty in the past discontinuing it and ending up in the hospital.  Provide instructions every two weeks of the 0.5 change versus all the directions for the taper at once to prevent confusion.  Please slow the taper as needed.   3. Consider a vitamin D  supplement based on 2023 levels, repeat lab recommended 4. BH specialist to follow up.  Clinical Assessment/Diagnosis Encounter Diagnoses  Name Primary?    MDD (major depressive disorder), recurrent episode, moderate (HCC) Yes   Generalized anxiety disorder     Assessment: Patient currently experiencing depression and anxiety symptoms related to external stressors (grief).  Patient reports that she wants to prioritize decreasing benzodiazepine use slowly..   Patient reports that she takes benzodiazepine 1 mg tablet in the morning, sometimes 1/2 to 1 mg tablet in the afternoon, and 1 mg tablet in the evening.  Patient states that she feels the dosage in the afternoon could be optional, so recommended that patient take 1 mg in the morning, 1 mg in the evening, and 0 to 0.5 mg in the afternoon.  Patient reflects understanding at time of session.  We will revisit in 2 weeks to assess symptoms and progress.  Patient may benefit from ongoing IBH support.  Plan: Follow up with behavioral health clinician on : 03/12/2024 Behavioral recommendations:  Patient to be compliant with medication dosage decrease.  Patient has been warned of the dangers of sudden discontinuation of benzodiazepines. Referral(s): Integrated [employer] (In Clinic)  [place] R Wilgus Deyton, LCSW

## 2024-02-27 NOTE — Patient Instructions (Signed)
 2-week protocol:  1 mg Xanax  in the morning, 1 mg Xanax  in the evening, and 0 to 0.5 mg Xanax  in the afternoon  We will reevaluate in 2 weeks to assess symptoms and next steps   Using Behavioral Activation to manage stress/depression symptoms     Identify/understand your own mood triggers.   Structure your day--get up around the same time, eat meals/snacks around the same time, go to bed around the same time.   Purposefully schedule self care time and time to complete tasks. This can include quiet time.  Stimulate your brain--go for a walk, text/call a friend or family member, if you are indoors--go outside (and vice versa), go for a drive, go to a store with bright colors and bright lights. Try to do things in a different way--drive to your favorite places using an alternative route, or instead of starting on the right side of the grocery store when shopping, start on the left side. You might feel a bit uncomfortable doing things outside of the comfort zone, but this is helping the brain create new neural pathways and is very healthy for brain/emotional health.   Physical movement based on your ability. If you can go for a walk, do stretches, even waving your hands to music can trigger feel-good endorphins in the brain and help release physical tension we all hold in our bodies.  Even 5 minutes can make a difference.   Be intentional about doing things that bring you joy (or used to bring you joy), and look for the things in every day that make you happy.  Seek those glimmers of joy each day.  Set a timer for 5 minutes for a harder task (ex. Laundry, washing dishes).  Allow yourself to work distraction-free for 5 minutes, then stop when the timer goes off. If you need a break, take a break. If you want to continue working then set another timer for whatever time you choose.   Limit or eliminate substance use including alcohol, marijuana, or recreational use of prescription  medication.  Let in the light!! Open the window blinds, curtains and let natural light in. Even sitting near a window or sitting outside can boost your mood, especially in the wintertime when there is less daylight.   If you take medication to manage symptoms, remember to take all medications as they are prescribed (please read all labels!!)    Things to envision for ourselves to to improve inspiration, motivation, and initiative :  improving physical wellness, focus on family relationships, focusing on our own mental/emotional well being, being a part of a bigger community, finding a hobby, being a part of something that fosters personal growth, engaging socially with others (even digitally!!!)

## 2024-03-03 ENCOUNTER — Telehealth (INDEPENDENT_AMBULATORY_CARE_PROVIDER_SITE_OTHER): Payer: Self-pay | Admitting: Licensed Clinical Social Worker

## 2024-03-03 DIAGNOSIS — F411 Generalized anxiety disorder: Secondary | ICD-10-CM

## 2024-03-03 DIAGNOSIS — F39 Unspecified mood [affective] disorder: Secondary | ICD-10-CM

## 2024-03-03 DIAGNOSIS — F331 Major depressive disorder, recurrent, moderate: Secondary | ICD-10-CM

## 2024-03-04 NOTE — BH Specialist Note (Cosign Needed Addendum)
 Attestation signed by Warren Becker, PMHNP, DNP 03/04/2024 8:04 PM   Collaborative Care Psychiatric Consultant Case Review   Assessment/Provisional Diagnosis 79 year old female with history of SVT, obesity, hyperlipidemia, HTN, and stenosis of esophagus. The patient is referred for anxiety and depression.   Provisional Diagnosis: # MDD, Recurrent, moderately severe # GAD   Recommendation 1. Continue slow Xanax  taper provided on the last review. 2. Restart Lexapro  10 mg daily 3. BH specialist to follow up.   Thank you for your consult. Please contact our collaborative care team for any questions or concerns.    The above treatment considerations and suggestions are based on consultation with the [facility] specialist and/or PCP and a review of information available in the shared registry and the patient's Electronic Health Record (EHR). I have not personally examined the patient. All recommendations should be implemented with consideration of the patient's relevant prior history and current clinical status. Please feel free to call me with any questions about the care of this patient.    Virtual Behavioral Health Treatment Plan Team Note  MRN: [id number] NAME: LESHIA KOPE  DATE: 03/04/24  Start time: Start Time: 1630 End time: Stop Time: 1650 Total time: Total Time in Minutes (Visit): 20  Total number of Virtual BH Treatment Team Plan encounters: 2/4  Treatment Team Attendees: Amana Bouska, LCSW and Sharlot Becker, DNP   Diagnoses:    ICD-10-CM   1. MDD (major depressive disorder), recurrent episode, moderate (HCC)  F33.1     2. Generalized anxiety disorder  F41.1       Goals, Interventions and Follow-up Plan Goals: Increase healthy adjustment to current life circumstances decrease benzodiazepine use Interventions: Motivational Interviewing Medication Monitoring Supportive Counseling  Medication Management Recommendations:  1. Continue slow Xanax  taper provided on the  last review. 2. Restart Lexapro  10 mg daily  Follow-up Plan: IBH COLLABORATIVE CARE TEAM consultation and development of treatment plan  History of the present illness Presenting Problem/Current Symptoms: Patient reports the following symptoms/concerns: Patient reports she has occasional symptoms of depression including low energy, feeling sad after the loss of several individuals close to her.  Patient reports she still experiences anxiety, worries on a regular basis.   Patient reports her primary concern at time of session is decreasing overall benzodiazepine use.   Duration of problem: Ongoing; Severity of problem: mild  Psychiatric History    Have you ever been treated for a mental health problem? Yes If Yes, when were you treated and whom did you see (psychiatrist/counselor) ?        When: pt reports that she was admitted to [facility] ED's for depression over the summer              Name of provider: [facility], [facility] ED    Depression: Yes Anxiety: Yes Mania: No Psychosis: No PTSD symptoms: Yes   Past Psychiatric History/Hospitalization(s): Hospitalization for psychiatric illness: Yes Prior Suicide Attempts: No Prior Self-injurious behavior: No   Have you ever had thoughts of harming yourself or others or attempted suicide? No plan to harm self or others  Psychosocial stressors Flowsheet Row Integrated Behavioral Health from 02/16/2024 in [facility]  Current Stressors Family conflict, Grief/losses, Peer relationships, Surgery/procedure  Familial Stressors None  Sleep Decreased  [pt takes xanax  nightly for sleep]  Appetite No problems  Coping ability Overwhelmed  Patient taking medications as prescribed Yes    Self-harm Behaviors Risk Assessment Flowsheet Row Integrated Behavioral Health from 02/16/2024  in [facility]  Self-harm risk factors Family or marital conflict  Have you recently had any thoughts  about harming yourself? No    Screenings PHQ-9 Assessments:     02/16/2024   12:03 PM 10/24/2023    3:13 PM 06/24/2023    1:17 PM  Depression screen PHQ 2/9  Decreased Interest 2 0 2  Down, Depressed, Hopeless 2 3 2   PHQ - 2 Score 4 3 4   Altered sleeping 0 0 0  Tired, decreased energy 3 3 2   Change in appetite 2 1 0  Feeling bad or failure about yourself  3 3 0  Trouble concentrating 2 1 0  Moving slowly or fidgety/restless 0 0 0  Suicidal thoughts 2 0 0  PHQ-9 Score 16 11  6    Difficult doing work/chores Very difficult Somewhat difficult Not difficult at all     Data saved with a previous flowsheet row definition   GAD-7 Assessments:     02/16/2024   12:04 PM 10/24/2023    3:13 PM 06/23/2023   10:10 AM 12/20/2022    2:30 PM  GAD 7 : Generalized Anxiety Score  Nervous, Anxious, on Edge 3 3 3 2   Control/stop worrying 3 3 3 2   Worry too much - different things 3 3 3 2   Trouble relaxing 3 3 3 1   Restless 2 2 3 1   Easily annoyed or irritable 3 3 3 3   Afraid - awful might happen 3 3 3 2   Total GAD 7 Score 20 20 21 13   Anxiety Difficulty Very difficult Very difficult Very difficult Not difficult at all    Past Medical History Past Medical History:  Diagnosis Date   Abdominal hernia    Anxiety    Arthritis    lower spine   Back pain    lower back - S/P lifting injury   Closed fracture of lateral malleolus of right fibula 10/11/2020   Complication of anesthesia    makes hair brittle   COVID-19 09/20/2022   GERD (gastroesophageal reflux disease)    Hyperlipidemia    Hypertension    Neuromuscular disorder (HCC)    numbness legs and feet s/p lower back injury   Partial bowel obstruction (HCC) 02/04/2018   Partial small bowel obstruction (HCC) 02/04/2018   SBO (small bowel obstruction) (HCC) 03/13/2017   Stroke (HCC)    mini - strokes 2009 - no deficit   Vertigo    none recently   Vitamin D  deficiency    Wears contact lenses     Vital signs: There were no vitals  filed for this visit.  Allergies:  Allergies as of 03/03/2024 - Review Complete 01/06/2024  Allergen Reaction Noted   Levofloxacin  Swelling 12/19/2016   Calcium  channel blockers  08/01/2014   Amoxicillin  Other (See Comments) 08/01/2014   Nickel Rash 09/16/2014   Penicillins Other (See Comments) 08/01/2014    Medication History Current medications:  Outpatient Encounter Medications as of 03/03/2024  Medication Sig   albuterol  (VENTOLIN  HFA) 108 (90 Base) MCG/ACT inhaler TAKE 2 PUFFS BY MOUTH EVERY 6 HOURS AS NEEDED FOR WHEEZE OR SHORTNESS OF BREATH   ALPRAZolam  (XANAX ) 1 MG tablet TAKE 1/2 TO 1 (ONE-HALF TO ONE) TABLET BY MOUTH THREE TIMES DAILY AS NEEDED FOR ANXIETY   aspirin  EC 81 MG tablet Take 81 mg by mouth daily.   clotrimazole  (LOTRIMIN ) 1 % cream Apply 1 application topically 2 (two) times daily.   dicyclomine  (BENTYL ) 10 MG capsule TAKE 1  CAPSULE BY MOUTH THREE TIMES DAILY BEFORE MEAL(S) AS NEEDED FOR STOMACH PAIN   escitalopram  (LEXAPRO ) 10 MG tablet Take 1 tablet (10 mg total) by mouth daily.   estradiol  (ESTRACE ) 0.01 % CREA vaginal cream Place 1 Applicatorful vaginally at bedtime.   ezetimibe  (ZETIA ) 10 MG tablet Take 1 tablet (10 mg total) by mouth daily.   fluticasone  (FLONASE ) 50 MCG/ACT nasal spray PLACE 1-2 SPRAYS INTO BOTH NOSTRILS DAILY AS NEEDED FOR ALLERGIES OR RHINITIS.   fluticasone  (FLONASE ) 50 MCG/ACT nasal spray Place 2 sprays into both nostrils daily.   ibuprofen  (ADVIL ) 800 MG tablet Take 1 tablet (800 mg total) by mouth daily as needed (severe pain).   omeprazole  (PRILOSEC) 40 MG capsule Take 1 capsule by mouth once daily   ondansetron  (ZOFRAN -ODT) 4 MG disintegrating tablet DISSOLVE 1 TABLET IN MOUTH EVERY 8 HOURS AS NEEDED FOR NAUSEA OR VOMITING   promethazine  (PHENERGAN ) 12.5 MG tablet TAKE 1 TO 2 TABLETS BY MOUTH EVERY 8 HOURS AS NEEDED FOR NAUSEA FOR VOMITING   rosuvastatin  (CRESTOR ) 20 MG tablet Take 1 tablet by mouth once daily   Spacer/Aero-Holding  Chambers (EASIVENT) inhaler Use as needed with inhaler   Spacer/Aero-Holding Chambers (EQ SPACE CHAMBER ANTI-STATIC) DEVI Inhale into the lungs as needed.   No facility-administered encounter medications on file as of 03/03/2024.     Scribe for Treatment Team: Sascha Baugher R Coreena Rubalcava, LCSW

## 2024-03-07 MED ORDER — ESCITALOPRAM OXALATE 10 MG PO TABS: ORAL_TABLET | ORAL | 0 refills | Status: AC

## 2024-03-07 NOTE — Addendum Note (Signed)
 Addended by: GASPER NANCYANN BRAVO on: 03/07/2024 09:01 AM   Modules accepted: Orders

## 2024-03-12 ENCOUNTER — Ambulatory Visit: Admitting: Licensed Clinical Social Worker

## 2024-03-12 DIAGNOSIS — F411 Generalized anxiety disorder: Secondary | ICD-10-CM

## 2024-03-12 DIAGNOSIS — F331 Major depressive disorder, recurrent, moderate: Secondary | ICD-10-CM

## 2024-03-12 NOTE — BH Specialist Note (Signed)
 Virtual Visit via Video Note  I connected with [name] on 03/12/24 at  2:30 PM EST by a video enabled telemedicine application and verified that I am speaking with the correct person using two identifiers.  Location: Patient: Primary residence, [place] Provider: Clinician virtual office, [place]   I discussed the limitations of evaluation and management by telemedicine and the availability of in person appointments. The patient expressed understanding and agreed to proceed.   I discussed the assessment and treatment plan with the patient. The patient was provided an opportunity to ask questions and all were answered. The patient agreed with the plan and demonstrated an understanding of the instructions.   Integrated Behavioral Health Follow Up Visit  MRN: [id number] Name: [name]  Number of Integrated Behavioral Health Clinician visits: 5-Fifth Visit  Session Start time: 1430   Session End time: 1500  Total time in minutes: 30  Encounter Diagnoses  Name Primary?   MDD (major depressive disorder), recurrent episode, moderate (HCC) Yes   Generalized anxiety disorder     Types of Service: Telephone visit and Collaborative care   Subjective: [name] is a 79 y.o. adult patient with history of depression and anxiety seen in consultation at the request of [name] PA C for establishment of [facility] Collaborative Care management. Pt is currently taking the following psychiatric medications: Alprazolam  1mg  TID, escitalopram  10 mg .  Patient reports that she has not been taking the escitalopram , and has not for quite some time.  Patient reports the following symptoms/concerns: Pt reports that she is experiencing anxiety, worries, irritability.  Patient wishes to decrease benzodiazepine use.  Patient reports that she has been taking 1/2 xanax  in the morning, 1/2 xanax  in the afternoon, and one whole xanax  in the evening. This is  less than her original titration amount  recommended on 02/27/24.SABRA Crystal pt of the impact of decreasing less than provider recommended.  Current protocol:  1/2 xanax  in AM, 1/2 xanax  in afternoon, 1 whole pill in evening. Pt reports that she has not been taking lexapro --IBH psych provider JINNY Becker DNP recommended pt restart lexapro  10 since she already has this medication for any breakthrough anxiety.  Pt reflects understanding. IBH clinician texted pt (with pt consent--pt requested a text)  of current protocol and reminder of next scheduled appointment.   Duration of problem: Ongoing; Severity of problem: mild  Objective: Mood: Anxious and Affect: Appropriate Risk of harm to self or others: No plan to harm self or others  Life Context: Family and Social:   School/Work: N/A  Self-Care: Patient admits that she needs to do a better job of focusing on herself and self-care.  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  Life Changes: Patient reports that she has had several significant losses recently--3 individuals that were close with her family. Pt reports that she is continuing to struggle with symptoms of grief.  Patient reports that she is feeling stressed triggered by upcoming winter weather, and focusing on the wellbeing of multiple family members.  Patient also reports frustration that she feels they are not as prepared as she would like them to be.  Patient and/or Family's Strengths/Protective Factors: Social connections, Social and Emotional competence, Concrete supports in place (healthy food, safe environments, etc.), and Physical Health (exercise, healthy diet, medication compliance, etc.)  Goals Addressed: Patient will:  Reduce symptoms of: anxiety   Increase knowledge and/or  ability of: coping skills, healthy habits, self-management skills, and stress reduction   Demonstrate ability to: Increase healthy adjustment to current life circumstances and  decrease benzodiazepine use  Progress towards Goals: Ongoing  Interventions: Interventions utilized:  Motivational Interviewing, Medication Monitoring, and Supportive Counseling Standardized Assessments completed: Not Needed      02/16/2024   12:03 PM 10/24/2023    3:13 PM 06/24/2023    1:17 PM  Depression screen PHQ 2/9  Decreased Interest 2 0 2  Down, Depressed, Hopeless 2 3 2   PHQ - 2 Score 4 3 4   Altered sleeping 0 0 0  Tired, decreased energy 3 3 2   Change in appetite 2 1 0  Feeling bad or failure about yourself  3 3 0  Trouble concentrating 2 1 0  Moving slowly or fidgety/restless 0 0 0  Suicidal thoughts 2 0 0  PHQ-9 Score 16 11  6    Difficult doing work/chores Very difficult Somewhat difficult Not difficult at all     Data saved with a previous flowsheet row definition        02/16/2024   12:04 PM 10/24/2023    3:13 PM 06/23/2023   10:10 AM 12/20/2022    2:30 PM  GAD 7 : Generalized Anxiety Score  Nervous, Anxious, on Edge 3  3  3  2    Control/stop worrying 3  3  3  2    Worry too much - different things 3  3  3  2    Trouble relaxing 3  3  3  1    Restless 2  2  3  1    Easily annoyed or irritable 3  3  3  3    Afraid - awful might happen 3  3  3  2    Total GAD 7 Score 20 20 21 13   Anxiety Difficulty Very difficult Very difficult Very difficult Not difficult at all     Data saved with a previous flowsheet row definition     [facility] from 03/12/2024 in [facility] Integrated Behavioral Health from 02/16/2024 in [facility] from 07/16/2023 in [facility]   C-SSRS RISK CATEGORY High Risk High Risk No Risk    Patient and/or Family Response: Patient reports that her primary concern is decreasing overall benzodiazepine use.  Patient Centered Plan: Patient is on the following Treatment Plan(s):  The recommendation was to increase Lexapro  from 10 mg to 20  mg--patient states that she has not been taking Lexapro  at all, [facility] clinician will consult with psychiatric provider to determine next steps. 2. Recommend reducing Xanax  1 mg TID to Xanax  1 mg BID and Xanax  0.5 mg daily, reduce Xanax  every 2 weeks by 0.5 mg.  The patient desires to taper off of Xanax  and has had difficulty in the past discontinuing it and ending up in the hospital.  Provide instructions every two weeks of the 0.5 change versus all the directions for the taper at once to prevent confusion.  Please slow the taper as needed.   3. Consider a vitamin D  supplement based on 2023 levels, repeat lab recommended 4. BH specialist to follow up.  Clinical Assessment/Diagnosis Encounter Diagnoses  Name Primary?   MDD (major depressive disorder), recurrent episode, moderate (HCC) Yes   Generalized anxiety disorder      Assessment: IBH clinician reviewed [name] 's PHQ-9, GAD-7, and CSSRS scores.  IBH clinician explored current external stressors that  pt is experiencing and reviewed coping skills that patient is utilizing and provided additional recommendations of alternative coping skills to support symptom management.   IBH clinician provided pt with psychoeducational materials in after visit summary and via text message, and instructed pt to review materials through their confidential MyChart portal. IBH clinician encouraging continued [facility] Collaborative Care Team supports including counseling and medication management.  .  Plan: Follow up with behavioral health clinician on : 04/02/2024 Behavioral recommendations:  Patient to be compliant with medication dosage decrease.  Patient has been warned of the dangers of sudden discontinuation of benzodiazepines. Restart Lexapro  10 daily Referral(s): Integrated [employer] (In Clinic)  [place] R Jerrian Mells, LCSW

## 2024-03-12 NOTE — Patient Instructions (Signed)
 Using Behavioral Activation to manage stress/depression symptoms     Identify/understand your own mood triggers.   Structure your day--get up around the same time, eat meals/snacks around the same time, go to bed around the same time.   Purposefully schedule self care time and time to complete tasks. This can include quiet time.  Stimulate your brain--go for a walk, text/call a friend or family member, if you are indoors--go outside (and vice versa), go for a drive, go to a store with bright colors and bright lights. Try to do things in a different way--drive to your favorite places using an alternative route, or instead of starting on the right side of the grocery store when shopping, start on the left side. You might feel a bit uncomfortable doing things outside of the comfort zone, but this is helping the brain create new neural pathways and is very healthy for brain/emotional health.   Physical movement based on your ability. If you can go for a walk, do stretches, even waving your hands to music can trigger feel-good endorphins in the brain and help release physical tension we all hold in our bodies.  Even 5 minutes can make a difference.   Be intentional about doing things that bring you joy (or used to bring you joy), and look for the things in every day that make you happy.  Seek those glimmers of joy each day.  Set a timer for 5 minutes for a harder task (ex. Laundry, washing dishes).  Allow yourself to work distraction-free for 5 minutes, then stop when the timer goes off. If you need a break, take a break. If you want to continue working then set another timer for whatever time you choose.   Limit or eliminate substance use including alcohol, marijuana, or recreational use of prescription medication.  Let in the light!! Open the window blinds, curtains and let natural light in. Even sitting near a window or sitting outside can boost your mood, especially in the wintertime when  there is less daylight.   If you take medication to manage symptoms, remember to take all medications as they are prescribed (please read all labels!!)    Things to envision for ourselves to to improve inspiration, motivation, and initiative :  improving physical wellness, focus on family relationships, focusing on our own mental/emotional well being, being a part of a bigger community, finding a hobby, being a part of something that fosters personal growth, engaging socially with others (even digitally!!!)    ANXIETY/PANIC EPISODE MANAGEMENT (CBT/MINDFULNESS BASED)   If you are in a highly stimulating or triggering environment, change your location to a less stimulating or safer environment .   Stimulate your senses by tasting/eating a sour candy (such as a lemon drop) or a strong flavored cough drop.  This triggers smell, taste, and touch since we  have had lots of nerve endings inside of your mouth.   Practice slow, controlled breathing to avoid hyperventilation.  Breathing into your nose for the count of 4 inside your head, holding your breath for the count of 4 inside your head, and exhale slowly counting to 8 inside your head.  This is called 4-4-8 breathing, or triangle breathing. (Controlled breathing). This helps manage panic, anxiety, anger, and tearfulness.  Additional grounding exercises include rubbing your hands softly together, or wiggling toes inside your shoes, pretending to grasp the floor with your toes.    Listen to calming music or sounds  Count backwards in your head by  twos or tens or recite multiplication tables in your head.  Visualize a calming, happy place and identify 5 explicit details about this place (color, temperature, smells, visual details, etc.)  Splash cold water on your face or hold an ice cube in your hand (alternate hands)  Clench and release muscle groups (hands, shoulders, facial muscles)  Engage in soothing activities to recover after a  stressful/anxious episode such as: drinking a warm beverage, sitting in your favorite comfortable location, positive physical contact with a pet or weighted blanket, using positive self talk/positive affirmations.   Download PTSD Coach app for your phone or tablet--its free and has lots of tips to help you manage panic episodes no matter where you are!    Emergency Resources:  National Suicide & Crisis Lifeline: Call or text 988  Crisis Text Line: Text HOME to 332-080-6994  [facility]  [address] [place], [place] [address] [phone] or [phone] [facility] 24/7 FOR ANYONE [address], [place], [place]  [phone] Fax: [phone] [email or web address] *Interpreters available *Accepts all insurance and uninsured for Urgent Care needs *Accepts Medicaid and uninsured for outpatient treatment (below)

## 2024-03-24 ENCOUNTER — Other Ambulatory Visit: Payer: Self-pay | Admitting: Family Medicine

## 2024-03-24 DIAGNOSIS — G8929 Other chronic pain: Secondary | ICD-10-CM

## 2024-04-02 ENCOUNTER — Ambulatory Visit: Admitting: Licensed Clinical Social Worker

## 2024-06-30 ENCOUNTER — Ambulatory Visit
